# Patient Record
Sex: Male | Born: 1955 | Race: Black or African American | Hispanic: No | Marital: Married | Smoking: Never smoker
Health system: Southern US, Community
[De-identification: ages and names within clinical notes are randomized; demographics above are authoritative.]

## PROBLEM LIST (undated history)

## (undated) DIAGNOSIS — R091 Pleurisy: Secondary | ICD-10-CM

## (undated) DIAGNOSIS — M48061 Spinal stenosis, lumbar region without neurogenic claudication: Secondary | ICD-10-CM

## (undated) DIAGNOSIS — C61 Malignant neoplasm of prostate: Secondary | ICD-10-CM

## (undated) DIAGNOSIS — R200 Anesthesia of skin: Secondary | ICD-10-CM

## (undated) DIAGNOSIS — R202 Paresthesia of skin: Secondary | ICD-10-CM

## (undated) DIAGNOSIS — K5732 Diverticulitis of large intestine without perforation or abscess without bleeding: Secondary | ICD-10-CM

## (undated) HISTORY — PX: POLYPECTOMY: SHX149

## (undated) HISTORY — DX: Spinal stenosis, lumbar region without neurogenic claudication: M48.061

## (undated) HISTORY — DX: Diverticulitis of large intestine without perforation or abscess without bleeding: K57.32

## (undated) HISTORY — PX: COLONOSCOPY: SHX174

## (undated) HISTORY — DX: Pleurisy: R09.1

---

## 2005-04-04 ENCOUNTER — Ambulatory Visit: Payer: Self-pay | Admitting: Internal Medicine

## 2005-04-05 ENCOUNTER — Encounter: Payer: Self-pay | Admitting: Internal Medicine

## 2005-04-05 ENCOUNTER — Ambulatory Visit (HOSPITAL_COMMUNITY): Admission: RE | Admit: 2005-04-05 | Discharge: 2005-04-05 | Payer: Self-pay | Admitting: Internal Medicine

## 2005-04-18 ENCOUNTER — Ambulatory Visit: Payer: Self-pay | Admitting: Internal Medicine

## 2005-04-20 ENCOUNTER — Ambulatory Visit: Payer: Self-pay

## 2005-05-11 ENCOUNTER — Ambulatory Visit: Payer: Self-pay | Admitting: Internal Medicine

## 2005-09-07 ENCOUNTER — Ambulatory Visit: Payer: Self-pay | Admitting: Internal Medicine

## 2005-11-18 ENCOUNTER — Ambulatory Visit: Payer: Self-pay | Admitting: Internal Medicine

## 2006-04-12 ENCOUNTER — Ambulatory Visit: Payer: Self-pay | Admitting: Internal Medicine

## 2006-05-01 ENCOUNTER — Ambulatory Visit: Payer: Self-pay | Admitting: Internal Medicine

## 2006-07-03 ENCOUNTER — Ambulatory Visit: Payer: Self-pay | Admitting: Internal Medicine

## 2006-07-12 ENCOUNTER — Ambulatory Visit: Payer: Self-pay | Admitting: Internal Medicine

## 2006-10-03 ENCOUNTER — Ambulatory Visit: Payer: Self-pay | Admitting: Internal Medicine

## 2007-04-16 ENCOUNTER — Ambulatory Visit: Payer: Self-pay | Admitting: Internal Medicine

## 2007-08-31 ENCOUNTER — Encounter: Payer: Self-pay | Admitting: Internal Medicine

## 2007-08-31 DIAGNOSIS — L02519 Cutaneous abscess of unspecified hand: Secondary | ICD-10-CM | POA: Insufficient documentation

## 2007-08-31 DIAGNOSIS — L03119 Cellulitis of unspecified part of limb: Secondary | ICD-10-CM

## 2007-08-31 DIAGNOSIS — R091 Pleurisy: Secondary | ICD-10-CM | POA: Insufficient documentation

## 2007-08-31 DIAGNOSIS — N508 Other specified disorders of male genital organs: Secondary | ICD-10-CM | POA: Insufficient documentation

## 2007-09-27 ENCOUNTER — Encounter: Payer: Self-pay | Admitting: Internal Medicine

## 2007-09-27 ENCOUNTER — Ambulatory Visit: Payer: Self-pay | Admitting: Internal Medicine

## 2007-09-27 DIAGNOSIS — M48061 Spinal stenosis, lumbar region without neurogenic claudication: Secondary | ICD-10-CM

## 2007-09-27 DIAGNOSIS — K5900 Constipation, unspecified: Secondary | ICD-10-CM | POA: Insufficient documentation

## 2007-09-27 DIAGNOSIS — Z8719 Personal history of other diseases of the digestive system: Secondary | ICD-10-CM | POA: Insufficient documentation

## 2007-09-27 DIAGNOSIS — N390 Urinary tract infection, site not specified: Secondary | ICD-10-CM | POA: Insufficient documentation

## 2007-09-27 HISTORY — DX: Spinal stenosis, lumbar region without neurogenic claudication: M48.061

## 2007-09-27 LAB — CONVERTED CEMR LAB
Bacteria, UA: NEGATIVE
Leukocytes, UA: NEGATIVE
Total Protein, Urine: NEGATIVE mg/dL
pH: 6 (ref 5.0–8.0)

## 2009-07-22 ENCOUNTER — Ambulatory Visit: Payer: Self-pay | Admitting: Internal Medicine

## 2009-07-22 DIAGNOSIS — R1032 Left lower quadrant pain: Secondary | ICD-10-CM | POA: Insufficient documentation

## 2009-07-23 ENCOUNTER — Encounter: Payer: Self-pay | Admitting: Internal Medicine

## 2009-07-23 ENCOUNTER — Inpatient Hospital Stay (HOSPITAL_COMMUNITY): Admission: AD | Admit: 2009-07-23 | Discharge: 2009-07-26 | Payer: Self-pay | Admitting: Internal Medicine

## 2009-07-23 ENCOUNTER — Ambulatory Visit: Payer: Self-pay | Admitting: Cardiology

## 2009-07-23 ENCOUNTER — Ambulatory Visit: Payer: Self-pay | Admitting: Internal Medicine

## 2009-07-23 DIAGNOSIS — K5732 Diverticulitis of large intestine without perforation or abscess without bleeding: Secondary | ICD-10-CM

## 2009-07-23 DIAGNOSIS — R310 Gross hematuria: Secondary | ICD-10-CM | POA: Insufficient documentation

## 2009-07-23 HISTORY — DX: Diverticulitis of large intestine without perforation or abscess without bleeding: K57.32

## 2009-07-23 LAB — CONVERTED CEMR LAB
ALT: 21 units/L (ref 0–53)
AST: 17 units/L (ref 0–37)
Albumin: 3.4 g/dL — ABNORMAL LOW (ref 3.5–5.2)
BUN: 9 mg/dL (ref 6–23)
Basophils Relative: 0.4 % (ref 0.0–3.0)
Chloride: 103 meq/L (ref 96–112)
Eosinophils Relative: 0.1 % (ref 0.0–5.0)
GFR calc non Af Amer: 89.86 mL/min (ref >60)
HCT: 35.8 % — ABNORMAL LOW (ref 39.0–52.0)
Hemoglobin: 12.3 g/dL — ABNORMAL LOW (ref 13.0–17.0)
Lymphs Abs: 2 10*3/uL (ref 0.7–4.0)
MCV: 89.8 fL (ref 78.0–100.0)
Monocytes Absolute: 1.5 10*3/uL — ABNORMAL HIGH (ref 0.1–1.0)
Monocytes Relative: 8.4 % (ref 3.0–12.0)
Neutro Abs: 13.8 10*3/uL — ABNORMAL HIGH (ref 1.4–7.7)
Nitrite: POSITIVE
Potassium: 3.5 meq/L (ref 3.5–5.1)
RBC: 3.99 M/uL — ABNORMAL LOW (ref 4.22–5.81)
Sodium: 138 meq/L (ref 135–145)
Total Bilirubin: 1.5 mg/dL — ABNORMAL HIGH (ref 0.3–1.2)
Total Protein, Urine: 100 mg/dL
Total Protein: 7.9 g/dL (ref 6.0–8.3)
Urobilinogen, UA: >=8 (ref 0.0–1.0)
WBC: 17.4 10*3/uL — ABNORMAL HIGH (ref 4.5–10.5)

## 2009-08-12 ENCOUNTER — Ambulatory Visit: Payer: Self-pay | Admitting: Gastroenterology

## 2009-08-21 ENCOUNTER — Encounter: Payer: Self-pay | Admitting: Gastroenterology

## 2009-08-21 ENCOUNTER — Ambulatory Visit: Payer: Self-pay | Admitting: Gastroenterology

## 2009-08-25 ENCOUNTER — Encounter: Payer: Self-pay | Admitting: Gastroenterology

## 2010-09-24 ENCOUNTER — Ambulatory Visit: Payer: Self-pay | Admitting: Internal Medicine

## 2010-11-30 ENCOUNTER — Ambulatory Visit: Payer: Self-pay | Admitting: Internal Medicine

## 2010-11-30 DIAGNOSIS — M79609 Pain in unspecified limb: Secondary | ICD-10-CM | POA: Insufficient documentation

## 2010-12-01 ENCOUNTER — Telehealth: Payer: Self-pay | Admitting: Internal Medicine

## 2010-12-04 ENCOUNTER — Ambulatory Visit (HOSPITAL_COMMUNITY)
Admission: RE | Admit: 2010-12-04 | Discharge: 2010-12-04 | Payer: Self-pay | Source: Home / Self Care | Attending: Internal Medicine | Admitting: Internal Medicine

## 2010-12-06 ENCOUNTER — Telehealth (INDEPENDENT_AMBULATORY_CARE_PROVIDER_SITE_OTHER): Payer: Self-pay | Admitting: *Deleted

## 2010-12-07 ENCOUNTER — Encounter: Payer: Self-pay | Admitting: Internal Medicine

## 2010-12-07 ENCOUNTER — Telehealth: Payer: Self-pay | Admitting: Internal Medicine

## 2011-01-18 NOTE — Assessment & Plan Note (Signed)
Summary: ?poison ivy/cd   Vital Signs:  Patient profile:   55 year old male Height:      70 inches Weight:      223 pounds BMI:     32.11 O2 Sat:      98 % on Room air Temp:     97.8 degrees F oral Pulse rate:   89 / minute BP sitting:   152 / 84  (left arm) Cuff size:   regular  Vitals Entered By: Bill Salinas [name] (September 24, 2010 3:31 PM)  O2 Flow:  Room air CC: office visit for evaluation of poison oak/ ab   Primary Care Mayank Teuscher:  Illene Regulus, MD  CC:  office visit for evaluation of poison oak/ ab.  History of Present Illness: Patient presents with contact dermatitis of several days duration. He has excoriated his arms. He has spread of rash to abdomen and chest. No pain but he has consideral pruritis.  Current Medications (verified): 1)  Tylenol Extra Strength 500 Mg  Tabs (Acetaminophen) .... As Needed 2)  Advil 200 Mg  Caps (Ibuprofen) .... As Needed  Allergies (verified): 1)  Vicodin PMH-FH-SH reviewed-no changes except otherwise noted  Review of Systems  The patient denies anorexia, fever, weight loss, chest pain, dyspnea on exertion, abdominal pain, muscle weakness, and enlarged lymph nodes.    Physical Exam  General:  Well-developed,well-nourished,in no acute distress; alert,appropriate and cooperative throughout examination Head:  normocephalic and atraumatic.   Eyes:  C&S clear Lungs:  normal respiratory effort and no wheezes.   Heart:  normal rate and regular rhythm.   Skin:  scaly, whitish excoriated rash worse on the left forearm. Also present on right arm and abdomen. He has linear lesion on the right arm. Psych:  normally interactive and good eye contact.     Impression & Recommendations:  Problem # 1:  RHUS DERMATITIS (ICD-692.6) Patient with spreading contact dermatitis.  Plan - prednisone burst and taper           for pruritis - loratadine 10mg  two times a day and ranitidine 150mg  two times a day.           for failure to resolve will  refer to dermatologist - He will call if needed.   His updated medication list for this problem includes:    Prednisone 10 Mg Tabs (Prednisone) .Marland KitchenMarland KitchenMarland KitchenMarland Kitchen 4 tabs once daily x 3, 3 tas once daily x 3,  2 tabs once daily x 3, 1 tab once daily x 6  Complete Medication List: 1)  Tylenol Extra Strength 500 Mg Tabs (Acetaminophen) .... As needed 2)  Advil 200 Mg Caps (Ibuprofen) .... As needed 3)  Prednisone 10 Mg Tabs (Prednisone) .... 4 tabs once daily x 3, 3 tas once daily x 3,  2 tabs once daily x 3, 1 tab once daily x 6  Patient Instructions: 1)  contact dermatitis - take prednisone burst and taper as instructed on the bottle; tepid baths for severe itching with a couple of tablespoons of baking soda; for itching not relieved with prednisone take loratadine 10mg  twice a day and rantitidine 150mg  twice a day.  Prescriptions: PREDNISONE 10 MG TABS (PREDNISONE) 4 tabs once daily x 3, 3 tas once daily x 3,  2 tabs once daily x 3, 1 tab once daily x 6  #33 x 0   Entered and Authorized by:   Jacques Navy MD   Signed by:   Jacques Navy MD on 09/24/2010  Method used:   Electronically to        [facility]. # [id number]* (retail)       [address]       [place], [place]  [address]       Ph: [phone] or [phone]       Fax: [phone]   RxID:   [id number]

## 2011-01-20 NOTE — Letter (Signed)
Summary: Out of Work  [facility]  [address] [place], [place] [address]   Phone: [phone]  Fax: [phone]    November 30, 2010   Employee:  Juan Richards    To Whom It May Concern:   For Medical reasons, please excuse the above named employee from work for the following dates:   Start:  Tuesday, November 30, 2010  End:   Monday, December 1`9, 2011  If you need additional information, please feel free to contact our office.         Sincerely,    Jacques Navy MD

## 2011-01-20 NOTE — Letter (Signed)
Summary: Out of Work  [facility]  [address] [place], [place] [address]   Phone: [phone]  Fax: [phone]    December 07, 2010   Employee:  MIKKO LEWELLEN    To Whom It May Concern:   For Medical reasons, please excuse the above named employee from work for the following dates:    End: December 08, 2010 and return to work December 09, 2010    If you need additional information, please feel free to contact our office.         Sincerely,    Jacques Navy MD

## 2011-01-20 NOTE — Progress Notes (Signed)
Summary: PAIN   Phone Note Call from Patient   Caller: Patient Summary of Call: Patient called stating that he started all given meds yesterday. Four tabs of prednisone and  3 tabs of roxicet w/o relief. Pt states that he was up all night due to discomfort. Patient would like something different. Please advise. Initial call taken by: Rock Nephew [name],  December 01, 2010 9:39 AM  Follow-up for Phone Call        he may take two oxycodone every six hours. He will need a new prescription - can pick-up RX tomorrow for oxycodone/APAP 5/325 sig - 1 or 2 every 6 hours, #80. Follow-up by: Jacques Navy MD,  December 01, 2010 1:28 PM  Additional Follow-up for Phone Call Additional follow up Details #1::        left mess to call office back................Marland KitchenLamar Sprinkles, [name]  December 01, 2010 3:00 PM     Additional Follow-up for Phone Call Additional follow up Details #2::    pt states oxycodone has not helped at all. He needs something else he states he has been taking two with no relief Follow-up by: Ami Bullins [name],  December 01, 2010 3:07 PM  Additional Follow-up for Phone Call Additional follow up Details #3:: Details for Additional Follow-up Action Taken: left a message on patient's cell phone - call back this evening by six to either arrange for a long acting morphine product or to arrange a 24 hr obs admisssion for pain mgt and to work up the problem. Gave himn the [phone] to call.   Several attempts, unable to contact pt, no return calls................Marland KitchenLamar Sprinkles, [name]  December 03, 2010 4:35 PM   Additional Follow-up by: Jacques Navy MD,  December 01, 2010 4:56 PM

## 2011-01-20 NOTE — Progress Notes (Signed)
Summary: [facility] NOTE & PAIN MED  Phone Note Call from Patient Call back at Work Phone [phone]   Summary of Call: 1. Patient is requesting rx to help w/pain. 2 of previous med helped very little. Does he need office visit?  2. Patient is requesting work note - To return to wk 12/22.  Initial call taken by: Lamar Sprinkles, [name],  December 07, 2010 9:12 AM  Follow-up for Phone Call        1. start gabapentin 300mg  1 at bedtime x 5 days, then two times a day x 5 days then three times a day. #90, refill x 3 2. oxycodone/apap 7.5/325 1 by mouth three times a day x 30 nor refills 3. work return done.  Follow-up by: Jacques Navy MD,  December 07, 2010 1:07 PM  Additional Follow-up for Phone Call Additional follow up Details #1::        left mess to call office back.................Marland KitchenLamar Sprinkles, [name]  December 07, 2010 2:44 PM   Pt informed  Additional Follow-up by: Lamar Sprinkles, [name],  December 07, 2010 3:10 PM    New/Updated Medications: ENDOCET 10-325 MG TABS (OXYCODONE-ACETAMINOPHEN) 1 by mouth three times a day as needed GABAPENTIN 300 MG CAPS (GABAPENTIN) 1 at bedtime x 5 days, 1 two times a day x 5 days then 1 three times a day Prescriptions: GABAPENTIN 300 MG CAPS (GABAPENTIN) 1 at bedtime x 5 days, 1 two times a day x 5 days then 1 three times a day  #90 x 0   Entered by:   Lamar Sprinkles, [name]   Authorized by:   Jacques Navy MD   Signed by:   Lamar Sprinkles, [name] on 12/07/2010   Method used:   Electronically to        [facility]  [address] [id number]* (retail)       [address]       [place], [place]  [address]       Ph: [phone] or [phone]       Fax: [phone]   RxID:   [id number] ENDOCET 10-325 MG TABS (OXYCODONE-ACETAMINOPHEN) 1 by mouth three times a day as needed  #30 x 0   Entered and Authorized by:   Jacques Navy MD   Signed by:   Jacques Navy MD on 12/07/2010   Method used:   Print then Give to Patient   RxID:    [id number]

## 2011-01-20 NOTE — Assessment & Plan Note (Signed)
Summary: back & leg pain/#/cd   Vital Signs:  Patient profile:   55 year old male Height:      70 inches Weight:      225 pounds BMI:     32.40 O2 Sat:      98 % on Room air Temp:     97.2 degrees F oral Pulse rate:   58 / minute BP sitting:   118 / 86  (left arm) Cuff size:   regular  Vitals Entered By: Bill Salinas [name] (November 30, 2010 3:33 PM)  O2 Flow:  Room air CC: pt c/o sciatic nerve pain radiating down right leg/ ab Comments Pt states he is currently not taking any medications/ ab   Primary Care Provider:  Illene Regulus, MD  CC:  pt c/o sciatic nerve pain radiating down right leg/ ab.  History of Present Illness: patient presents with a three day history of severe pain in the right leg that shoots down his whole leg. He has a h/o bulging disks with MRI lumbar spine in '06. He did do lifting and bending putting up the Xmas tree. He also had several hard sneezes and the pain started right after that.  He rates this pain  as severe that interferes with his ability to sleep and work.   Current Medications (verified): 1)  Tylenol Extra Strength 500 Mg  Tabs (Acetaminophen) .... As Needed 2)  Advil 200 Mg  Caps (Ibuprofen) .... As Needed 3)  Prednisone 10 Mg Tabs (Prednisone) .... 4 Tabs Once Daily X 3, 3 Tas Once Daily X 3,  2 Tabs Once Daily X 3, 1 Tab Once Daily X 6  Allergies (verified): 1)  Vicodin  Past History:  Past Medical History: Last updated: 08/12/2009 Hx of EPIDIDYMAL CYST (ICD-608.89) Hx of CELLULITIS, HAND (ICD-682.4) Hx of PLEURISY (ICD-511.0) spinal stenosis/lumbar ddd Diverticulitis  Past Surgical History: Last updated: 08/12/2009 Unremarkable  Family History: Last updated: 08/12/2009 Father: ? cancer Mother: dm  Siblings:  Family History of Kidney Disease:Mother  No FH of Colon Cancer:  Social History: Last updated: 08/12/2009 Occupation: Tow Set designer  Married  Patient has never smoked.  Alcohol Use - no Daily Caffeine Use:  one daily  Illicit Drug Use - no Patient does not get regular exercise.   Review of Systems       The patient complains of difficulty walking.  The patient denies anorexia, fever, weight loss, weight gain, chest pain, syncope, dyspnea on exertion, prolonged cough, abdominal pain, severe indigestion/heartburn, muscle weakness, unusual weight change, abnormal bleeding, and angioedema.    Physical Exam  General:  Well-developed,well-nourished,in no acute distress; alert,appropriate and cooperative throughout examination Head:  Normocephalic and atraumatic without obvious abnormalities. No apparent alopecia or balding. Neck:  supple and full ROM.   Lungs:  normal respiratory effort.   Heart:  normal rate and regular rhythm.   Msk:  normal ROM, no joint tenderness, no joint swelling, and no joint warmth.   Pulses:  2+ pulse DP Neurologic:  alert & oriented X3 and cranial nerves II-XII intact.  Unable to toe walk right foot, step-up to exam table with great effort and discomfort, markedly diminished DTR right patellar tendon, mild decrease in deep vibratory sensation right distal LE/foot. Skin:  turgor normal, color normal, no rashes, and no suspicious lesions.   Psych:  Oriented X3, memory intact for recent and remote, normally interactive, and good eye contact.     Impression & Recommendations:  Problem # 1:  LEG  PAIN, RIGHT (ICD-729.5) Suspect acute HNP with radicular findings. Reviewed previous MRI '06 with multi-level disk bulging.  Plan - oxycodone/APAP 5/325 1-2 q6           prednisone burst and taper           MRI lumbar spine           NS referral if needed.  Orders: Radiology Referral (Radiology)  Addendum 12/14 - patient's MRI is scheduled for Saturday. He called back with unrlieved pain. Have tried to reach him to offer in-patient eval and pain mgt but as of 2000hrs no call back.  Complete Medication List: 1)  Tylenol Extra Strength 500 Mg Tabs (Acetaminophen) .... As  needed 2)  Advil 200 Mg Caps (Ibuprofen) .... As needed 3)  Prednisone 10 Mg Tabs (Prednisone) .... 4 tabs once daily x 3, 3 tas once daily x 3,  2 tabs once daily x 3, 1 tab once daily x 6 4)  Roxicet 5-325 Mg Tabs (Oxycodone-acetaminophen) .Marland Kitchen.. 1 by mouth q6 as needed severe leg pain. Prescriptions: PREDNISONE 10 MG TABS (PREDNISONE) 4 tabs once daily x 3, 3 tas once daily x 3,  2 tabs once daily x 3, 1 tab once daily x 6  #33 x 0   Entered and Authorized by:   Jacques Navy MD   Signed by:   Jacques Navy MD on 11/30/2010   Method used:   Print then Give to Patient   RxID:   [id number] ROXICET 5-325 MG TABS (OXYCODONE-ACETAMINOPHEN) 1 by mouth q6 as needed severe leg pain.  #30 x 0   Entered and Authorized by:   Jacques Navy MD   Signed by:   Jacques Navy MD on 11/30/2010   Method used:   Print then Give to Patient   RxID:   [id number]    Orders Added: 1)  Radiology Referral [Radiology] 2)  Est. Patient Level IV [[address]]

## 2011-01-20 NOTE — Progress Notes (Signed)
Summary: MRI RESULTS  Phone Note Call from Patient   Caller: pt Summary of Call: Pt calling for results on MRI and what the next step should be in his care. Please Advise Initial call taken by: Ami Bullins [name],  December 06, 2010 9:40 AM  Follow-up for Phone Call        MRI showed mild degenerative disk disease but no acute, severe nerve impingement. If his pain has continued at the same level would recommend NS evaluation. Follow-up by: Jacques Navy MD,  December 06, 2010 10:02 AM  Additional Follow-up for Phone Call Additional follow up Details #1::        Pt continues to have pain and req rx to help. Roxicet  2 at a time gave very little relief. Never recieved messages from last week from our office. He would also like referral.  Additional Follow-up by: Lamar Sprinkles, [name],  December 06, 2010 12:01 PM    Additional Follow-up for Phone Call Additional follow up Details #2::    will refer to neurosurgery Follow-up by: Jacques Navy MD,  December 06, 2010 12:52 PM

## 2011-03-27 LAB — CBC
HCT: 36.6 % — ABNORMAL LOW (ref 39.0–52.0)
Hemoglobin: 11 g/dL — ABNORMAL LOW (ref 13.0–17.0)
Hemoglobin: 11.1 g/dL — ABNORMAL LOW (ref 13.0–17.0)
Hemoglobin: 11.9 g/dL — ABNORMAL LOW (ref 13.0–17.0)
MCHC: 32.5 g/dL (ref 30.0–36.0)
MCHC: 33.6 g/dL (ref 30.0–36.0)
MCV: 92 fL (ref 78.0–100.0)
RBC: 3.58 MIL/uL — ABNORMAL LOW (ref 4.22–5.81)
RBC: 3.61 MIL/uL — ABNORMAL LOW (ref 4.22–5.81)
RBC: 3.98 MIL/uL — ABNORMAL LOW (ref 4.22–5.81)
RDW: 13 % (ref 11.5–15.5)
WBC: 10 10*3/uL (ref 4.0–10.5)

## 2011-03-27 LAB — URINE MICROSCOPIC-ADD ON

## 2011-03-27 LAB — BASIC METABOLIC PANEL
Calcium: 8.6 mg/dL (ref 8.4–10.5)
GFR calc Af Amer: >60 mL/min (ref >60)
GFR calc non Af Amer: >60 mL/min (ref >60)
Sodium: 137 mEq/L (ref 135–145)

## 2011-03-27 LAB — URINALYSIS, ROUTINE W REFLEX MICROSCOPIC
Leukocytes, UA: NEGATIVE
Protein, ur: 30 mg/dL — AB
Specific Gravity, Urine: 1.023 (ref 1.005–1.030)
Urobilinogen, UA: 2 mg/dL — ABNORMAL HIGH (ref 0.0–1.0)

## 2011-03-27 LAB — PROTIME-INR
INR: 1.1 (ref 0.00–1.49)
Prothrombin Time: 14.5 seconds (ref 11.6–15.2)

## 2011-03-27 LAB — URINE CULTURE
Colony Count: NO GROWTH
Culture: NO GROWTH

## 2011-05-03 NOTE — Consult Note (Signed)
Juan Richards, Juan Richards             ACCOUNT NO.:  [id number]   MEDICAL RECORD NO.:  [id number]          PATIENT TYPE:  INP   LOCATION:  1506                         FACILITY:  [facility]   PHYSICIAN:  Clovis Pu. Cornett, M.D.DATE OF BIRTH:  [birth year]   DATE OF CONSULTATION:  07/23/2009  DATE OF DISCHARGE:                                 CONSULTATION   PHYSICIAN REQUESTING CONSULTATION:  Dr. Felicity Coyer of the hospitalist  service for [place].   REASON FOR CONSULTATION:  Abdominal pain and acute diverticulitis.   HISTORY OF PRESENT ILLNESS:  The patient is a pleasant 55 year old male  with a 4-day history of left lower quadrant pain.  He saw Dr. Melvyn Novas  yesterday who placed him on Cipro and Flagyl and sent him for a CT scan  today.  Today, his pain is improved about 3/10 compared to 7/10  yesterday.  A CT showed acute diverticulitis with some small  questionable loculus of air versus diverticula.  There was no abscess.  He has had no fever, no chills, and his left lower quadrant pain which  was 7/10 yesterday is now 3/10.  There is no radiation to his back,  groin, or into his pelvis.  There has been no associated nausea,  vomiting or diarrhea with it.  I was asked to the patient at the request  of Dr. Felicity Coyer for this.   PAST MEDICAL HISTORY:  History of pleurisy and cellulitis.   PAST SURGICAL HISTORY:  None.   MEDICATIONS:  He currently is on Cipro and Flagyl at home and some pain  medicine.   ALLERGIES:  VICODIN.   FAMILY HISTORY:  Noncontributory.   SOCIAL HISTORY:  No evidence of tobacco or alcohol use.   REVIEW OF SYSTEMS:  Positive for left lower quadrant pain, otherwise  negative x15.   PHYSICAL EXAMINATION:  VITAL SIGNS:  His temperature is 98, pulse 77,  blood pressure 123/76.  HEENT:  No evidence of jaundice.  Oropharynx is clear.  NECK:  Supple, nontender.  Trachea midline.  PULMONARY:  Lung sounds are clear bilaterally.  Chest wall motion is  normal.  CARDIOVASCULAR:  Regular rate and rhythm without rub, murmur, or gallop.  EXTREMITIES:  Warm, well perfused.  ABDOMEN:  Tender left lower quadrant which was quite minimal. No mass.  No hernia.  No diffuse peritonitis.  EXTREMITIES:  No clubbing, cyanosis, nor edema.  Muscle tone and range  of motion normal.  NEUROLOGICAL:  Glasgow coma scale is 15.  Motor and sensory functions  are grossly intact.   DIAGNOSTIC STUDIES:  Reviewed abdominal and pelvic CT scan which showed  acute diverticulitis.  There may be some small loculus of air, or these  could be diverticula on end.  There is no abscess.  There is no free  fluid or otherwise free air.  He had a white count 17,400 with left  shift.  Hemoglobin 12.3, platelet count 206,000. BMP sodium 138,  potassium 3.5, chloride 103, CO2 of 30, BUN 9, creatinine 1.1, glucose  113.  Urinalysis is positive for nitrite, otherwise negative.   IMPRESSION:  Acute diverticulitis without major complicating  features.   PLAN:  I recommend IV antibiotics and would transition to p.o.  antibiotics as tolerated.  I would keep him n.p.o. tonight and advance  his diet tomorrow as long as his pain is improved.  This is his first  attack on talking with him. Therefore, no surgical interventions is  probably required long-term.  If he does not improve, then he could be a  surgical candidate. But currently, he needs to be treated medically with  medical follow-up as long as he improves.  Thank you for this  consultation.      Thomas A. Cornett, M.D.  Electronically Signed     TAC/MEDQ  D:  07/23/2009  T:  07/23/2009  Job:  [id number]   cc:   Penni Bombard, MD  Fax: [phone]   Vikki Ports A. Felicity Coyer, MD  [address] [place], [place] [address]

## 2011-05-03 NOTE — Discharge Summary (Signed)
NAMEWILBURT, MESSINA             ACCOUNT NO.:  [id number]   MEDICAL RECORD NO.:  [id number]          PATIENT TYPE:  INP   LOCATION:  1506                         FACILITY:  [facility]   PHYSICIAN:  Rosalyn Gess. Norins, MD  DATE OF BIRTH:  Jul 12, 1956   DATE OF ADMISSION:  07/23/2009  DATE OF DISCHARGE:  07/26/2009                               DISCHARGE SUMMARY   ADMITTING DIAGNOSIS:  Diverticulitis.   DISCHARGE DIAGNOSIS:  Diverticulitis.   CONSULTANTS:  Dr. Luisa Hart, [facility].   PROCEDURES:  1. CT scan of the abdomen and pelvis performed on July 23, 2009,      which showed prominent diverticulitis-type changes in the distal      descending colon and sigmoid colon.  Degenerative change in the      lumbar spine.  Pelvis showed descending colon, sigmoid colon      diverticulitis with changes extending into the pelvis without      evidence of abscess or drainable abscess.  2. Abdominal film July 25, 2009, which showed air fluid levels within      the nondilated large and small bowel compatible with ileus.   HISTORY OF THE PRESENT ILLNESS:  Patient is a 55 year old African  American gentleman who was seen in the office by Dr. Efrain Sella for  abdominal pain and discomfort.  CT scan of the abdomen was ordered which  revealed diverticulitis.  An attempt was made for outpatient treatment  but the patient had increasing pain and discomfort.  Patient has had  normal bowel movements.  He has complained of nausea.  He had no fevers  or chills.  No prior colonoscopy.  He had been started on Cipro and  Flagyl as an outpatient.  Patient also complains of worsening pain with  urination and question of hematuria x2 but no flank pain.  Because of  his known diverticulitis by CT and progressive symptoms, he was  admitted.  Please see the EMR generated H and P for past medical  history, family history, social history, and examination at admission.   HOSPITAL COURSE:  1. Patient  was admitted to a regular floor.  He had been seen in      consultation by Dr. Luisa Hart for [facility] surgery and it was      felt the patient was not in need of surgical intervention.  Patient      was followed conservatively with being made n.p.o.  No NG suction      was required.  Patient was started on IV Cipro and Flagyl.  On this      regimen, the patient did well.  He had decreasing pain and      discomfort.  He had maintained a good appetite.  He was having      bowel movements.  Followup KUB as noted.  Patient continued to      improve with absence of significant abdominal pain or discomfort.      He had no significant fever and was taking a diet.  With the      patient being medically stable without need  for surgical      intervention with his symptoms improving, from this perspective he      is felt to be ready for discharge home to continue outpatient oral      antibiotics.  2. Hematuria.  Patient reported he had had gross hematuria.  At the      time of admission, urinalysis was performed with microscopic exam      revealing only 0 to 2 red blood cells per high-powered field.      There were no further complaints of hematuria.  This problem does      not need additional workup at this time.  3. Hematochezia.  This is by history the patient having passed some      blood by rectum.  He was to be scheduled for colonoscopy and this      will still be the plan to schedule him for colonoscopy in 4 to 6      weeks.   DISCHARGE EXAMINATION:  Temperature 99.2.  Blood pressure 134/86.  Heart  rate 80.  Respirations 20.  GENERAL APPEARANCE:  A well-nourished, healthy-appearing, African  American gentleman in no acute distress.  CHEST:  Patient has no increased work of breathing.  ABDOMEN:  Patient has got mild guarding.  He had positive bowel sounds.  He had no significant tenderness to deep palpation.   FINAL LABORATORY:  CBC from July 25, 2009, with a white count of 9900,   hemoglobin of 11.1 g.  Urine was cultured and was no growth at the time  of discharge dictation.  CPK had been ordered at time of admission.  This was normal at 100.  Prior to admission, patient had liver function  studies that showed mild elevation and a total bilirubin at 1.5,  otherwise unremarkable.  Of note, his pre treatment white count was  17,400.   DISPOSITION:  Patient is discharged home.   DISCHARGE MEDICATIONS:  Patient will continue on medications as at  admission including:  1. Tylenol 500 mg 2 tablets q.6 p.r.n.  2. Advil 200 mg p.r.n.  3. Ciprofloxacin 500 mg b.i.d. to complete his home supply.  4. Metronidazole 500 mg t.i.d. to complete his home supply.   Patient will be seen in followup by Dr. Luisa Hart in 2 weeks.  He will be  seen by Dr. Debby Bud in followup as needed.  We will schedule the patient  for colonoscopy in 4 to 6 weeks.   CONDITION AT TIME OF DISCHARGE DICTATION:  Stable and improved.      Rosalyn Gess Norins, MD  Electronically Signed     MEN/MEDQ  D:  07/26/2009  T:  07/26/2009  Job:  [id number]   cc:   Thomas A. Cornett, M.D.  [address] [place] [address]  [place] [address]

## 2011-05-24 ENCOUNTER — Encounter: Payer: Self-pay | Admitting: Internal Medicine

## 2011-05-24 ENCOUNTER — Encounter: Payer: Self-pay | Admitting: *Deleted

## 2011-05-24 ENCOUNTER — Ambulatory Visit (INDEPENDENT_AMBULATORY_CARE_PROVIDER_SITE_OTHER): Payer: 59 | Admitting: Internal Medicine

## 2011-05-24 VITALS — BP 112/82 | HR 75 | Temp 97.4°F | Ht 70.0 in | Wt 224.8 lb

## 2011-05-24 DIAGNOSIS — S0003XA Contusion of scalp, initial encounter: Secondary | ICD-10-CM

## 2011-05-24 MED ORDER — HYDROCODONE-ACETAMINOPHEN 5-500 MG PO TABS: 1.0000 | ORAL_TABLET | ORAL | Status: AC | PRN

## 2011-05-24 NOTE — Patient Instructions (Signed)
It was good to see you today. No evidence for concussion but your scalp and temple will be sore and swollen for next few days Ice pack to injured area per session every 3-4h for next 24h, then as needed for swelling and pain Vicodin for pain as needed - Your prescription(s) have been submitted to your pharmacy. Please take as directed and contact our office if you believe you are having problem(s) with the medication(s). If family notices any confusion, or if your head pain becomes worse call here or EMS or go to ER as needed

## 2011-05-24 NOTE — Progress Notes (Signed)
  Subjective:    Patient ID: Juan Richards, male    DOB: 1956-10-21, 55 y.o.   MRN: [id number]  HPI complains of trauma to head -  Onset 3 h ago Ran into door frame when startled by co-workers this AM (rat in a box) Hit right side of temple/forehead against door No LOC, vision changes, or weakness Felt mild head pain but increasing tenderness over scalp in past 3 h associated with with "knot" and swelling over right forehead No confusion or eye pain  Past Medical History  Diagnosis Date  . DIVERTICULITIS, COLON, WITH PERFORATION 07/23/2009  . EPIDIDYMAL CYST 08/31/2007  . 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 PLEURISY     Review of Systems  Constitutional: Negative for fever.  Respiratory: Negative for shortness of breath.   Cardiovascular: Negative for chest pain.  Musculoskeletal: Negative for gait problem.  Neurological: Negative for tremors, syncope, facial asymmetry, speech difficulty, weakness, light-headedness and numbness.  Psychiatric/Behavioral: Negative for confusion.       Objective:   Physical Exam BP 112/82  Pulse 75  Temp(Src) 97.4 F (36.3 C) (Oral)  Ht 5\' 10"  (1.778 m)  Wt 224 lb 12.8 oz (101.969 kg)  BMI 32.26 kg/m2  SpO2 98%  Physical Exam  Constitutional:  oriented to person, place, and time. appears well-developed and well-nourished. No distress.  HENT - small hematoma 1.5cm raised knot over right lateral forehead at hairline, TMJ smooth and no other bruising/swelling along temple and right face Neck: Normal range of motion. Neck supple. No JVD present. No thyromegaly present.  Cardiovascular: Normal rate, regular rhythm and normal heart sounds.  No murmur heard. Pulmonary/Chest: Effort normal and breath sounds normal. No respiratory distress. no wheezes.  Neurological: he is alert and oriented to person, place, and time. No cranial nerve deficit. Coordination normal. no dysarthria, MAE 5/5 - vision intact B Skin: see HENT above - Skin is warm and  dry.  No laceration, erythema or ulceration.  Psychiatric: he has a normal mood and affect. behavior is normal. Judgment and thought content normal.        Assessment & Plan:  Head contusion with scalp hematoma - neuro exam benign, no evidence for concussion - reassurance provided and education of symptoms to watch - ice pack x 24h, then prn for swelling and pain - also limited rx for vicodin to help control pain symptoms (has taken same before and tolerated ok when taken separate from Tylenol #3 in past) - to have family supervise next 24h for any confusion or behavior change and call here or EMS/ER if changes noted - pt understands and agrees to same - work note for today provided

## 2011-11-04 ENCOUNTER — Other Ambulatory Visit: Payer: Self-pay | Admitting: Neurological Surgery

## 2011-11-04 DIAGNOSIS — M545 Low back pain: Secondary | ICD-10-CM

## 2011-11-09 ENCOUNTER — Other Ambulatory Visit: Payer: 59

## 2011-11-11 ENCOUNTER — Ambulatory Visit
Admission: RE | Admit: 2011-11-11 | Discharge: 2011-11-11 | Disposition: A | Discharge status: Home / Self Care | Payer: 59 | Source: Ambulatory Visit | Attending: Neurological Surgery | Admitting: Neurological Surgery

## 2011-11-11 DIAGNOSIS — M545 Low back pain: Secondary | ICD-10-CM

## 2011-11-28 ENCOUNTER — Encounter: Payer: Self-pay | Admitting: Internal Medicine

## 2013-08-14 ENCOUNTER — Encounter: Payer: Self-pay | Admitting: Internal Medicine

## 2013-08-14 ENCOUNTER — Ambulatory Visit (INDEPENDENT_AMBULATORY_CARE_PROVIDER_SITE_OTHER): Payer: 59 | Admitting: Internal Medicine

## 2013-08-14 VITALS — BP 120/74 | HR 86 | Temp 98.1°F | Wt 229.8 lb

## 2013-08-14 DIAGNOSIS — M48061 Spinal stenosis, lumbar region without neurogenic claudication: Secondary | ICD-10-CM

## 2013-08-14 DIAGNOSIS — M79609 Pain in unspecified limb: Secondary | ICD-10-CM

## 2013-08-14 MED ORDER — PREDNISONE 10 MG PO TABS: 10.0000 mg | ORAL_TABLET | Freq: Every day | ORAL | Status: DC

## 2013-08-14 NOTE — Patient Instructions (Addendum)
1. Leg pain and question of disc disease and nerve impingement: on exam there is no evidence of a moderate or severe nerve impingement. Thus no need for neurosurgical consultation. With multi -level disc disease epidural steroid injection treatment may prove difficult and would require a repeat MRI (last study in '12).  Plan  Prednisone burst and taper to help relieve any inflammation/swelling that could be the cause of the lower leg pain.  2. Swollen leg - concern for a possible deep vein thrombosis (blood clot in the calve).  Plan  Xeralto - one tonight and one in the AM  Will arrange for a lower extremity venous doppler study (ultrasound) to rule out a blood clot for tomorrow.   May want to consider having a routine general physical exam when the dust settles.

## 2013-08-14 NOTE — Progress Notes (Signed)
  Subjective:    Patient ID: Juan Richards, male    DOB: 1956-07-22, 57 y.o.   MRN: [id number]  HPI Juan Richards was last seen by Jadda Hunsucker in 2011. He has h/o bulging discs in the past and was seen by Dr. Yetta Barre at [place]. He was moving his daughter a week ago and he the onset of severe pain in the right leg: knee with radiation to calve and lower leg. He has pain when supine as well as when he is up. He has been taking ibuprofen 800 mg which does help. He also has leg swelling right. The leg itself is sore.   Past Medical History  Diagnosis Date  . DIVERTICULITIS, COLON, WITH PERFORATION 07/23/2009  . EPIDIDYMAL CYST 08/31/2007  . 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 PLEURISY    History reviewed. No pertinent past surgical history. Family History  Problem Relation Age of Onset  . Diabetes Mother   . Kidney disease Mother   . Cancer Father     ?   History   Social History  . Marital Status: Married    Spouse Name: N/A    Number of Children: N/A  . Years of Education: N/A   Occupational History  . Not on file.   Social History Main Topics  . Smoking status: Never Smoker   . Smokeless tobacco: Not on file  . Alcohol Use: Yes  . Drug Use: No  . Sexual Activity: Not on file   Other Topics Concern  . Not on file   Social History Narrative  . No narrative on file    No current outpatient prescriptions on file prior to visit.   No current facility-administered medications on file prior to visit.      Review of Systems System review is negative for any constitutional, cardiac, pulmonary, GI or neuro symptoms or complaints other than as described in the HPI.     Objective:   Physical Exam Filed Vitals:   08/14/13 1614  BP: 120/74  Pulse: 86  Temp: 98.1 F (36.7 C)   gen'l WNWD AA man in no distress HEENT- C&S clear Cor 2+ radial pulse, regular Pulm - normal respirations Back exam: normal stand; normal flex to greater than 100 degrees; abnormal gait favoring right  leg; normal toe/heel walk; normal step up to exam table; normal SLR sitting; normal DTR at the left patellar tendon, no DTR right patellar tendon; normal sensation to light touch, pin-prick and deep vibratory stimulus; no  CVA tenderness; able to move supine to sitting witout assistance. Ext - right calve 1 inch larger than the left, mild tenderness to palpation right calve        Assessment & Plan:  Swollen leg, right - concern for a possible deep vein thrombosis (blood clot in the calve).  Plan  Xeralto 15mg  - one tonight and one in the AM  Will arrange for a lower extremity venous doppler study (ultrasound) to rule out a blood clot for tomorrow - 10:30 at [facility].

## 2013-08-14 NOTE — Assessment & Plan Note (Signed)
Distal right leg pain. Exam w/o radicular findings. May be mild inflammtion with nerve root impingement L-4,5, L5-S1  Plan R/o DVT as source of pain  Prednisone burst and taper. If no relief and no clot - repeat MRI for possible ESI

## 2013-08-15 ENCOUNTER — Telehealth: Payer: Self-pay

## 2013-08-15 ENCOUNTER — Telehealth: Payer: Self-pay | Admitting: Internal Medicine

## 2013-08-15 ENCOUNTER — Ambulatory Visit (HOSPITAL_COMMUNITY)
Admission: RE | Admit: 2013-08-15 | Discharge: 2013-08-15 | Disposition: A | Discharge status: Home / Self Care | Payer: 59 | Source: Ambulatory Visit | Attending: Internal Medicine | Admitting: Internal Medicine

## 2013-08-15 ENCOUNTER — Other Ambulatory Visit (HOSPITAL_COMMUNITY): Payer: Self-pay | Admitting: Internal Medicine

## 2013-08-15 DIAGNOSIS — M79609 Pain in unspecified limb: Secondary | ICD-10-CM

## 2013-08-15 DIAGNOSIS — M25561 Pain in right knee: Secondary | ICD-10-CM

## 2013-08-15 DIAGNOSIS — M7989 Other specified soft tissue disorders: Secondary | ICD-10-CM

## 2013-08-15 NOTE — Telephone Encounter (Signed)
Spoke with pt. No DVT. If leg pain doesn't continue to improve will move ahead with MRI - he will contact the office next tuesday

## 2013-08-15 NOTE — Progress Notes (Signed)
Right lower extremity venous duplex completed.  Right:  No evidence of DVT, superficial thrombosis, or Baker's cyst.  Left:  Negative for DVT in the common femoral vein.  

## 2013-08-15 NOTE — Telephone Encounter (Signed)
Phone call and message left letting patient know of his 10:15 appt with vascular at [facility]

## 2014-05-20 ENCOUNTER — Ambulatory Visit: Payer: 59 | Admitting: Internal Medicine

## 2014-05-21 ENCOUNTER — Ambulatory Visit (INDEPENDENT_AMBULATORY_CARE_PROVIDER_SITE_OTHER): Payer: 59 | Admitting: Internal Medicine

## 2014-05-21 ENCOUNTER — Encounter: Payer: Self-pay | Admitting: Internal Medicine

## 2014-05-21 VITALS — BP 120/70 | HR 78 | Temp 98.2°F | Resp 14 | Ht 70.0 in | Wt 221.4 lb

## 2014-05-21 DIAGNOSIS — M67439 Ganglion, unspecified wrist: Secondary | ICD-10-CM

## 2014-05-21 DIAGNOSIS — R1032 Left lower quadrant pain: Secondary | ICD-10-CM

## 2014-05-21 DIAGNOSIS — M6740 Ganglion, unspecified site: Secondary | ICD-10-CM

## 2014-05-21 NOTE — Progress Notes (Signed)
Pre visit review using our clinic review tool, if applicable. No additional management support is needed unless otherwise documented below in the visit note. 

## 2014-05-21 NOTE — Patient Instructions (Signed)
If your symptoms persist or progress; I would recommend seeing Dr Gardenia Phlegm ,Sports Medicine. Use an anti-inflammatory cream such as Aspercreme or Zostrix cream twice a day to the affected area as needed. In lieu of this warm moist compresses or  hot water bottle can be used. Do not apply ice . Stay on clear liquids for 48-72 hours or until bowels are normal.This would include  jello, sherbert (NOT ice cream), Lipton's chicken noodle soup(NOT cream based soups),Gatorade Lite, flat Ginger ale (without High Fructose Corn Syrup),dry toast or crackers, baked potato.No milk , dairy or grease until bowels are formed. Align , a [employer] , daily if stools are loose. Immodium AD for frankly watery stool. Report increasing pain, fever or rectal bleeding

## 2014-05-21 NOTE — Progress Notes (Signed)
   Subjective:    Patient ID: Juan Richards, male    DOB: 11-26-1956, 58 y.o.   MRN: [id number]  HPI  His symptoms began 05/12/14 as pain on the ventral surface of the right medial wrist. He noted an enlargement of the distal wrist with some hyperpigmentation. Pain was described as up to a level X. Symptoms were worse with repetitive lifting. He also developed pain at the right elbow with radiation to ais high as the shoulder  He took 2 ibuprofen 800 mg with significant improvement in the symptoms. His pain is now a level III. The size of lesion has also decreased significantly.   Review of Systems He developed some left lower quadrant discomfort last night after eating taco shells. He does have a history of diverticulitis  Since last night he has had loose to watery stools. He denies associated fever, chills, rectal bleeding, or melena he has had occasional GI bleeding from hemorrhoids.        Objective:   Physical Exam  He appears healthy well-nourished in no distress  He has no scleral icterus  Has no lymphadenopathy about the neck or axilla  He has a regular rhythm without murmur or gallop.  There is no increased work of breathing; chest is clear  There is slight tenderness in left lower quadrant but no significant rebound.  A small ganglion is suggested at the radial aspect of the right wrist. This is minimally tender to palpation.  There is no evidence clinically of  R elbow tenosynovitis.  Strength, tone, deep tendon reflexes are normal.        Assessment & Plan:  #1 cystic lesion right radial wrist with pain.  A ganglion which has decreased in size is suggested.  #2 pain at his elbow and shoulder probably related to his changing how he lifts repetitively causing olecranon tenosynovitis.  #3 He has some abdominal discomfort with bowel changes. Clinically there is no evidence of significant diverticulitis  See orders and after visit summary.

## 2014-07-04 ENCOUNTER — Encounter: Payer: Self-pay | Admitting: Gastroenterology

## 2014-08-22 ENCOUNTER — Ambulatory Visit: Payer: 59 | Admitting: Internal Medicine

## 2015-03-16 ENCOUNTER — Encounter: Payer: Self-pay | Admitting: Gastroenterology

## 2015-03-17 ENCOUNTER — Encounter: Payer: Self-pay | Admitting: Gastroenterology

## 2015-06-20 ENCOUNTER — Ambulatory Visit (INDEPENDENT_AMBULATORY_CARE_PROVIDER_SITE_OTHER): Payer: Commercial Managed Care - HMO | Admitting: Family Medicine

## 2015-06-20 VITALS — BP 124/86 | HR 72 | Temp 98.6°F | Resp 15 | Ht 69.5 in | Wt 225.0 lb

## 2015-06-20 DIAGNOSIS — T63441A Toxic effect of venom of bees, accidental (unintentional), initial encounter: Secondary | ICD-10-CM | POA: Diagnosis not present

## 2015-06-20 DIAGNOSIS — H6121 Impacted cerumen, right ear: Secondary | ICD-10-CM | POA: Diagnosis not present

## 2015-06-20 MED ORDER — DIPHENHYDRAMINE HCL 50 MG/ML IJ SOLN
50.0000 mg | Freq: Once | INTRAMUSCULAR | Status: AC
  Administered 2015-06-20: 50 mg via INTRAMUSCULAR

## 2015-06-20 MED ORDER — CEPHALEXIN 500 MG PO CAPS: 500.0000 mg | ORAL_CAPSULE | Freq: Two times a day (BID) | ORAL | Status: DC

## 2015-06-20 MED ORDER — PREDNISONE 20 MG PO TABS: ORAL_TABLET | ORAL | Status: DC

## 2015-06-20 NOTE — Progress Notes (Addendum)
Subjective:  This chart was scribed for Juan Ray, MD by Thea Alken, ED Scribe. This patient was seen in room 3 and the patient's care was started at 9:26 AM.   Patient ID: Juan Richards, male    DOB: 08/12/1956, 59 y.o.   MRN: [id number]  HPI   Chief Complaint  Patient presents with  . bee sting    Cutting grass yesterday and got stung in hand, left hand  . Arm Swelling    left arm   HPI Comments: Juan Richards is a 59 y.o. male who presents to the [facility] complaining of a bee sting to left hand that occurred yesterday. Pt states a yellow jack stung him through his glove yesterday around 12pm. He experienced some swelling later that evening and took 2 doses of benadryl and applied ice to left hand. States he woke of this morning with worsening swelling spreading up left arm and warmth to left hand but denies taking benadryl or applying ice prior to being seen. He denies fevers, chills, trouble breathing and trouble swallowing. He denies hx of htn but reports family hx of htn. Pt drove himself here today but is calling his wife to come pick him up  Pt is also request to flush out right ear due to ear wax build up causing his ear to feel blocked. Pt has tried OTC ear wax removal without relief. He denies otalgia.  Patient Active Problem List   Diagnosis Date Noted  . LEG PAIN, RIGHT 11/30/2010  . DIVERTICULITIS, COLON, WITH PERFORATION 07/23/2009  . GROSS HEMATURIA 07/23/2009  . CONSTIPATION 09/27/2007  . 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 PLEURISY 08/31/2007  . EPIDIDYMAL CYST 08/31/2007   Past Medical History  Diagnosis Date  . DIVERTICULITIS, COLON, WITH PERFORATION 07/23/2009  . EPIDIDYMAL CYST 08/31/2007  . 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 PLEURISY    History reviewed. No pertinent past surgical history. No Known Allergies Prior to Admission medications   Not on File   History   Social History  . Marital Status: Married   Spouse Name: N/A  . Number of Children: N/A  . Years of Education: N/A   Occupational History  . Not on file.   Social History Main Topics  . Smoking status: Never Smoker   . Smokeless tobacco: Not on file  . Alcohol Use: Yes  . Drug Use: No  . Sexual Activity: Not on file   Other Topics Concern  . Not on file   Social History Narrative   Review of Systems  Constitutional: Negative for chills and fatigue.  HENT: Negative for ear pain and trouble swallowing.   Respiratory: Negative for apnea, shortness of breath and wheezing.   Gastrointestinal: Negative for nausea and vomiting.   Objective:   Physical Exam  Constitutional: He is oriented to person, place, and time. He appears well-developed and well-nourished.  HENT:  Head: Normocephalic and atraumatic.  Cerumen impaction of right ear  Eyes: EOM are normal. Pupils are equal, round, and reactive to light.  Neck: No JVD present. Carotid bruit is not present.  Cardiovascular: Normal rate, regular rhythm and normal heart sounds.  Exam reveals no gallop and no friction rub.   No murmur heard. Pulmonary/Chest: Effort normal and breath sounds normal. No respiratory distress. He has no wheezes. He has no rales. He exhibits no tenderness.  Musculoskeletal: He exhibits no edema.  Neurological: He is alert and oriented to person, place, and time.  Skin: Skin is warm and dry.  Left hand- diffuse swelling over dorsum extending to the fingers through the wrist and half way up the forearm with faint erythema. Small scab to site of the sting over 4th metacarpal with slight abrasion to area. NVI distally into fingertip that are warm. Cap < 1 sec. Tenderness in linear fashion slightly proximal to swollen area but no erythematous streaks noted.   Psychiatric: He has a normal mood and affect.  Vitals reviewed.  Filed Vitals:   06/20/15 0921  BP: 147/117  Pulse: 81  Temp: 98 F (36.7 C)  TempSrc: Oral  Resp: 16  SpO2: 99%   Assessment  & Plan:   Juan Richards is a 59 y.o. male Bee sting reaction, accidental or unintentional, initial encounter - Plan: diphenhydrAMINE (BENADRYL) injection 50 mg, predniSONE (DELTASONE) 20 MG tablet, Local reaction to bee sting, accidental or unintentional, initial encounter - Plan: predniSONE (DELTASONE) 20 MG tablet, cephALEXin (KEFLEX) 500 MG capsule   -Suspect yellowjacket sting. Now with large local reaction.  No systemic symptoms no respiratory or throat symptoms. Benadryl 50 mg IM given in office, then prednisone 60 mg today 40 mg tomorrow,  20 mg in 2 days for LLR. Some slight discomfort proximal to swelling which may be some lymphangitis. This can occur with bee stings and less likely cellulitis, but if swelling, redness and warmth are not improved tomorrow can start printed prescription for Keflex. Return to clinic precautions discussed including if not improving the next 2 days. Sooner or to the ER if worse   Cerumen impaction, right  - Lavage performed in office with resolution. Return to clinic precautions and avoidance measures in the future on AVS.  Meds ordered this encounter  Medications  . diphenhydrAMINE (BENADRYL) injection 50 mg    Sig:   . predniSONE (DELTASONE) 20 MG tablet    Sig: 3 tabs by mouth today, then 2 tabs by mouth once tomorrow, then 1 tab by mouth once in 2 days.    Dispense:  6 tablet    Refill:  0  . cephALEXin (KEFLEX) 500 MG capsule    Sig: Take 1 capsule (500 mg total) by mouth 2 (two) times daily.    Dispense:  20 capsule    Refill:  0   Patient Instructions  Swelling warmth and redness appears to be due to a large local reaction from the bee sting. Keep hand and arm elevated, ice on and off for 15 minutes as needed today. Benadryl 50 mg was given in the office, in 4-6 hours you can continue to take Benadryl 25 mg tablets 1-2 every fours to 6 hours as needed for itching or swelling. Start prednisone today, and will wean to lower doses over the next 2  days. This should also help with the swelling.  If the redness, warmth, and swelling are not improved tomorrow I did print out a prescription for an antibiotic, but infection after a bee sting is much less likely.  If you are not improving in 2 days - return for recheck, or if any worsening sooner return here or the emergency room as discussed.   For the wax in the ear, this was flushed today, but see information below to help lessen the chance of this in the future. If it does recur, you can try an over-the-counter treatment called the bronchus. If this does not help return to have procedure performed today.  If any pain, discharge,  or swelling of the ear  after procedure today return here or emergency room.  Return to the clinic or go to the nearest emergency room if any of your symptoms worsen or new symptoms occur.     Bee, Wasp, or Hornet Sting Your caregiver has diagnosed you as having an insect sting. An insect sting appears as a red lump in the skin that sometimes has a tiny hole in the center, or it may have a stinger in the center of the wound. The most common stings are from wasps, hornets and bees. Individuals have different reactions to insect stings.  A normal reaction may cause pain, swelling, and redness around the sting site.  A localized allergic reaction may cause swelling and redness that extends beyond the sting site.  A large local reaction may continue to develop over the next 12 to 36 hours.  On occasion, the reactions can be severe (anaphylactic reaction). An anaphylactic reaction may cause wheezing; difficulty breathing; chest pain; fainting; raised, itchy, red patches on the skin; a sick feeling to your stomach (nausea); vomiting; cramping; or diarrhea. If you have had an anaphylactic reaction to an insect sting in the past, you are more likely to have one again. HOME CARE INSTRUCTIONS   With bee stings, a small sac of poison is left in the wound. Brushing across this  with something such as a credit card, or anything similar, will help remove this and decrease the amount of the reaction. This same procedure will not help a wasp sting as they do not leave behind a stinger and poison sac.  Apply a cold compress for 10 to 20 minutes every hour for 1 to 2 days, depending on severity, to reduce swelling and itching.  To lessen pain, a paste made of water and baking soda may be rubbed on the bite or sting and left on for 5 minutes.  To relieve itching and swelling, you may use take medication or apply medicated creams or lotions as directed.  Only take over-the-counter or prescription medicines for pain, discomfort, or fever as directed by your caregiver.  Wash the sting site daily with soap and water. Apply antibiotic ointment on the sting site as directed.  If you suffered a severe reaction:  If you did not require hospitalization, an adult will need to stay with you for 24 hours in case the symptoms return.  You may need to wear a medical bracelet or necklace stating the allergy.  You and your family need to learn when and how to use an anaphylaxis kit or epinephrine injection.  If you have had a severe reaction before, always carry your anaphylaxis kit with you. SEEK MEDICAL CARE IF:   None of the above helps within 2 to 3 days.  The area becomes red, warm, tender, and swollen beyond the area of the bite or sting.  You have an oral temperature above 102 F (38.9 C). SEEK IMMEDIATE MEDICAL CARE IF:  You have symptoms of an allergic reaction which are:  Wheezing.  Difficulty breathing.  Chest pain.  Lightheadedness or fainting.  Itchy, raised, red patches on the skin.  Nausea, vomiting, cramping or diarrhea. ANY OF THESE SYMPTOMS MAY REPRESENT A SERIOUS PROBLEM THAT IS AN EMERGENCY. Do not wait to see if the symptoms will go away. Get medical help right away. Call your local emergency services (911 in [place] DO NOT drive yourself to the  hospital. MAKE SURE YOU:   Understand these instructions.  Will watch your condition.  Will get help  right away if you are not doing well or get worse. Document Released: 12/05/2005 Document Revised: 02/27/2012 Document Reviewed: 05/22/2010 [facility] Patient Information 2015 [place], [place]. This information is not intended to replace advice given to you by your health care provider. Make sure you discuss any questions you have with your health care provider.   Cerumen Impaction A cerumen impaction is when the wax in your ear forms a plug. This plug usually causes reduced hearing. Sometimes it also causes an earache or dizziness. Removing a cerumen impaction can be difficult and painful. The wax sticks to the ear canal. The canal is sensitive and bleeds easily. If you try to remove a heavy wax buildup with a cotton tipped swab, you may push it in further. Irrigation with water, suction, and small ear curettes may be used to clear out the wax. If the impaction is fixed to the skin in the ear canal, ear drops may be needed for a few days to loosen the wax. People who build up a lot of wax frequently can use ear wax removal products available in your local drugstore. SEEK MEDICAL CARE IF:  You develop an earache, increased hearing loss, or marked dizziness. Document Released: 01/12/2005 Document Revised: 02/27/2012 Document Reviewed: 03/04/2010 [facility] Patient Information 2015 [place], [place]. This information is not intended to replace advice given to you by your health care provider. Make sure you discuss any questions you have with your health care provider.       I personally performed the services described in this documentation, which was scribed in my presence. The recorded information has been reviewed and considered, and addended by me as needed.

## 2015-06-20 NOTE — Patient Instructions (Addendum)
Swelling warmth and redness appears to be due to a large local reaction from the bee sting. Keep hand and arm elevated, ice on and off for 15 minutes as needed today. Benadryl 50 mg was given in the office, in 4-6 hours you can continue to take Benadryl 25 mg tablets 1-2 every fours to 6 hours as needed for itching or swelling. Start prednisone today, and will wean to lower doses over the next 2 days. This should also help with the swelling.  If the redness, warmth, and swelling are not improved tomorrow I did print out a prescription for an antibiotic, but infection after a bee sting is much less likely.  If you are not improving in 2 days - return for recheck, or if any worsening sooner return here or the emergency room as discussed.   For the wax in the ear, this was flushed today, but see information below to help lessen the chance of this in the future. If it does recur, you can try an over-the-counter treatment called the bronchus. If this does not help return to have procedure performed today.  If any pain, discharge,  or swelling of the ear after procedure today return here or emergency room.  Return to the clinic or go to the nearest emergency room if any of your symptoms worsen or new symptoms occur.     Bee, Wasp, or Hornet Sting Your caregiver has diagnosed you as having an insect sting. An insect sting appears as a red lump in the skin that sometimes has a tiny hole in the center, or it may have a stinger in the center of the wound. The most common stings are from wasps, hornets and bees. Individuals have different reactions to insect stings.  A normal reaction may cause pain, swelling, and redness around the sting site.  A localized allergic reaction may cause swelling and redness that extends beyond the sting site.  A large local reaction may continue to develop over the next 12 to 36 hours.  On occasion, the reactions can be severe (anaphylactic reaction). An anaphylactic reaction  may cause wheezing; difficulty breathing; chest pain; fainting; raised, itchy, red patches on the skin; a sick feeling to your stomach (nausea); vomiting; cramping; or diarrhea. If you have had an anaphylactic reaction to an insect sting in the past, you are more likely to have one again. HOME CARE INSTRUCTIONS   With bee stings, a small sac of poison is left in the wound. Brushing across this with something such as a credit card, or anything similar, will help remove this and decrease the amount of the reaction. This same procedure will not help a wasp sting as they do not leave behind a stinger and poison sac.  Apply a cold compress for 10 to 20 minutes every hour for 1 to 2 days, depending on severity, to reduce swelling and itching.  To lessen pain, a paste made of water and baking soda may be rubbed on the bite or sting and left on for 5 minutes.  To relieve itching and swelling, you may use take medication or apply medicated creams or lotions as directed.  Only take over-the-counter or prescription medicines for pain, discomfort, or fever as directed by your caregiver.  Wash the sting site daily with soap and water. Apply antibiotic ointment on the sting site as directed.  If you suffered a severe reaction:  If you did not require hospitalization, an adult will need to stay with you for 24  hours in case the symptoms return.  You may need to wear a medical bracelet or necklace stating the allergy.  You and your family need to learn when and how to use an anaphylaxis kit or epinephrine injection.  If you have had a severe reaction before, always carry your anaphylaxis kit with you. SEEK MEDICAL CARE IF:   None of the above helps within 2 to 3 days.  The area becomes red, warm, tender, and swollen beyond the area of the bite or sting.  You have an oral temperature above 102 F (38.9 C). SEEK IMMEDIATE MEDICAL CARE IF:  You have symptoms of an allergic reaction which  are:  Wheezing.  Difficulty breathing.  Chest pain.  Lightheadedness or fainting.  Itchy, raised, red patches on the skin.  Nausea, vomiting, cramping or diarrhea. ANY OF THESE SYMPTOMS MAY REPRESENT A SERIOUS PROBLEM THAT IS AN EMERGENCY. Do not wait to see if the symptoms will go away. Get medical help right away. Call your local emergency services (911 in [place] DO NOT drive yourself to the hospital. MAKE SURE YOU:   Understand these instructions.  Will watch your condition.  Will get help right away if you are not doing well or get worse. Document Released: 12/05/2005 Document Revised: 02/27/2012 Document Reviewed: 05/22/2010 [facility] Patient Information 2015 [place], [place]. This information is not intended to replace advice given to you by your health care provider. Make sure you discuss any questions you have with your health care provider.   Cerumen Impaction A cerumen impaction is when the wax in your ear forms a plug. This plug usually causes reduced hearing. Sometimes it also causes an earache or dizziness. Removing a cerumen impaction can be difficult and painful. The wax sticks to the ear canal. The canal is sensitive and bleeds easily. If you try to remove a heavy wax buildup with a cotton tipped swab, you may push it in further. Irrigation with water, suction, and small ear curettes may be used to clear out the wax. If the impaction is fixed to the skin in the ear canal, ear drops may be needed for a few days to loosen the wax. People who build up a lot of wax frequently can use ear wax removal products available in your local drugstore. SEEK MEDICAL CARE IF:  You develop an earache, increased hearing loss, or marked dizziness. Document Released: 01/12/2005 Document Revised: 02/27/2012 Document Reviewed: 03/04/2010 [facility] Patient Information 2015 [place], [place]. This information is not intended to replace advice given to you by your health care provider. Make sure you  discuss any questions you have with your health care provider.

## 2015-12-20 HISTORY — PX: BACK SURGERY: SHX140

## 2016-08-08 ENCOUNTER — Emergency Department (HOSPITAL_COMMUNITY): Payer: Commercial Managed Care - HMO

## 2016-08-08 ENCOUNTER — Encounter (HOSPITAL_COMMUNITY): Payer: Self-pay | Admitting: Emergency Medicine

## 2016-08-08 ENCOUNTER — Emergency Department (HOSPITAL_COMMUNITY)
Admission: EM | Admit: 2016-08-08 | Discharge: 2016-08-08 | Disposition: A | Discharge status: Home / Self Care | Payer: Commercial Managed Care - HMO | Attending: Emergency Medicine | Admitting: Emergency Medicine

## 2016-08-08 DIAGNOSIS — M5442 Lumbago with sciatica, left side: Secondary | ICD-10-CM | POA: Insufficient documentation

## 2016-08-08 DIAGNOSIS — M5432 Sciatica, left side: Secondary | ICD-10-CM

## 2016-08-08 DIAGNOSIS — M5431 Sciatica, right side: Secondary | ICD-10-CM

## 2016-08-08 DIAGNOSIS — M5441 Lumbago with sciatica, right side: Secondary | ICD-10-CM

## 2016-08-08 DIAGNOSIS — M545 Low back pain: Secondary | ICD-10-CM | POA: Diagnosis present

## 2016-08-08 MED ORDER — METHYLPREDNISOLONE 4 MG PO TBPK: ORAL_TABLET | ORAL | 0 refills | Status: DC

## 2016-08-08 MED ORDER — HYDROCODONE-ACETAMINOPHEN 5-325 MG PO TABS: 2.0000 | ORAL_TABLET | ORAL | 0 refills | Status: DC | PRN

## 2016-08-08 MED ORDER — KETOROLAC TROMETHAMINE 60 MG/2ML IM SOLN
60.0000 mg | Freq: Once | INTRAMUSCULAR | Status: AC
  Administered 2016-08-08: 60 mg via INTRAMUSCULAR
  Filled 2016-08-08: qty 2

## 2016-08-08 MED ORDER — NAPROXEN 500 MG PO TABS: 500.0000 mg | ORAL_TABLET | Freq: Two times a day (BID) | ORAL | 0 refills | Status: AC

## 2016-08-08 MED ORDER — HYDROCODONE-ACETAMINOPHEN 5-325 MG PO TABS
2.0000 | ORAL_TABLET | Freq: Once | ORAL | Status: AC
  Administered 2016-08-08: 2 via ORAL
  Filled 2016-08-08: qty 2

## 2016-08-08 MED ORDER — CYCLOBENZAPRINE HCL 10 MG PO TABS: 10.0000 mg | ORAL_TABLET | Freq: Three times a day (TID) | ORAL | 0 refills | Status: DC | PRN

## 2016-08-08 MED ORDER — METHOCARBAMOL 500 MG PO TABS
1000.0000 mg | ORAL_TABLET | Freq: Once | ORAL | Status: AC
  Administered 2016-08-08: 1000 mg via ORAL
  Filled 2016-08-08: qty 2

## 2016-08-08 NOTE — ED Provider Notes (Signed)
[facility] DEPT Provider Note   CSN: [id number] Arrival date & time: 08/08/16  [id number]     History   Chief Complaint Chief Complaint  Patient presents with  . Back Pain  . Leg Pain    HPI Juan Richards is a 60 y.o. male.  HPI 60 year old male with past medical history of lumbar spinal stenosis who presents with bilateral lower back pain. Patient states that over the last month he has been in a low riding rental vehicle. Every time he gets up he feels a twinge in his back. Over the last several days he's had progressively worsening bilateral paraspinal lower back pain. He describes the pain as an aching and gnawing sensation. Occasionally he gets sharp shooting electrical type pains that radiate down both buttocks to the posterior knee. Denies any numbness or weakness. No loss of bowel or bladder function. Pain is made worse with certain positions. Denies any alleviating factors. No fevers or chills. No history of IV drug use.   Past Medical History:  Diagnosis Date  . DIVERTICULITIS, COLON, WITH PERFORATION 07/23/2009  . EPIDIDYMAL CYST 08/31/2007  . PLEURISY   . 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 LEG PAIN, RIGHT 11/30/2010  . DIVERTICULITIS, COLON, WITH PERFORATION 07/23/2009  . GROSS HEMATURIA 07/23/2009  . CONSTIPATION 09/27/2007  . 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 PLEURISY 08/31/2007  . EPIDIDYMAL CYST 08/31/2007    No past surgical history on file.     Home Medications    Prior to Admission medications   Medication Sig Start Date End Date Taking? Authorizing Provider  cephALEXin (KEFLEX) 500 MG capsule Take 1 capsule (500 mg total) by mouth 2 (two) times daily. 06/20/15   Wendie Agreste, MD  cyclobenzaprine (FLEXERIL) 10 MG tablet Take 1 tablet (10 mg total) by mouth 3 (three) times daily as needed for muscle spasms. 08/08/16   Duffy Bruce, MD  HYDROcodone-acetaminophen (NORCO/VICODIN) 5-325 MG  tablet Take 2 tablets by mouth every 4 (four) hours as needed for severe pain. 08/08/16   Duffy Bruce, MD  methylPREDNISolone (MEDROL DOSEPAK) 4 MG TBPK tablet Take as per package insert 08/08/16   Duffy Bruce, MD  naproxen (NAPROSYN) 500 MG tablet Take 1 tablet (500 mg total) by mouth 2 (two) times daily. 08/08/16 08/15/16  Duffy Bruce, MD  predniSONE (DELTASONE) 20 MG tablet 3 tabs by mouth today, then 2 tabs by mouth once tomorrow, then 1 tab by mouth once in 2 days. 06/20/15   Wendie Agreste, MD    Family History Family History  Problem Relation Age of Onset  . Diabetes Mother   . Kidney disease Mother   . Cancer Father     ?    Social History Social History  Substance Use Topics  . Smoking status: Never Smoker  . Smokeless tobacco: Never Used  . Alcohol use Yes     Allergies   Review of patient's allergies indicates no known allergies.   Review of Systems Review of Systems  Constitutional: Negative for chills, fatigue and fever.  HENT: Negative for congestion and rhinorrhea.   Eyes: Negative for visual disturbance.  Respiratory: Negative for cough, shortness of breath and wheezing.   Cardiovascular: Negative for chest pain and leg swelling.  Gastrointestinal: Negative for abdominal pain, diarrhea, nausea and vomiting.  Genitourinary: Negative for dysuria and flank pain.  Musculoskeletal: Positive for back pain and gait problem (Due to pain). Negative for neck  pain and neck stiffness.  Skin: Negative for rash and wound.  Allergic/Immunologic: Negative for immunocompromised state.  Neurological: Negative for syncope, weakness and headaches.  All other systems reviewed and are negative.    Physical Exam Updated Vital Signs BP 123/92   Pulse 61   Temp 97.6 F (36.4 C) (Oral)   Resp 18   Ht 5\' 10"  (1.778 m)   Wt 228 lb (103.4 kg)   SpO2 99%   BMI 32.71 kg/m   Physical Exam  Constitutional: He is oriented to person, place, and time. He appears  well-developed and well-nourished. No distress.  HENT:  Head: Normocephalic and atraumatic.  Eyes: Conjunctivae are normal.  Neck: Neck supple.  Cardiovascular: Normal rate, regular rhythm and normal heart sounds.  Exam reveals no friction rub.   No murmur heard. Pulmonary/Chest: Effort normal and breath sounds normal. No respiratory distress. He has no wheezes. He has no rales.  Abdominal: He exhibits no distension.  Musculoskeletal: He exhibits no edema.  Neurological: He is alert and oriented to person, place, and time. He exhibits normal muscle tone.  Skin: Skin is warm. Capillary refill takes less than 2 seconds.  Psychiatric: He has a normal mood and affect.  Nursing note and vitals reviewed.   Spine Exam: Inspection/Palpation: Moderate tenderness to palpation over the bilateral lower paraspinal areas. No midline tenderness. Strength: 5/5 throughout LE bilaterally (hip flexion/extension, adduction/abduction; knee flexion/extension; foot dorsiflexion/plantarflexion, inversion/eversion; great toe inversion) Sensation: Intact to light touch in proximal and distal LE bilaterally Reflexes: 2+ quadriceps and achilles reflexes  ED Treatments / Results  Labs (all labs ordered are listed, but only abnormal results are displayed) Labs Reviewed - No data to display  EKG  EKG Interpretation None       Radiology Dg Lumbar Spine Complete  Result Date: 08/08/2016 CLINICAL DATA:  Worsening low back pain for 3 weeks EXAM: LUMBAR SPINE - COMPLETE 4+ VIEW COMPARISON:  11/11/2011 FINDINGS: No vertebral compression deformity. Severe disc space narrowing at L2-3, L3-4, L4-5, and L5-S1 is not significantly changed. Disc height is relatively maintained at L1-2. There is facet arthropathy at L4-5 and L5-S1. IMPRESSION: No acute bony pathology.  Degenerative change. Electronically Signed   By: Marybelle Killings M.D.   On: 08/08/2016 07:55    Procedures Procedures (including critical care  time)  Medications Ordered in ED Medications  ketorolac (TORADOL) injection 60 mg (60 mg Intramuscular Given 08/08/16 0810)  methocarbamol (ROBAXIN) tablet 1,000 mg (1,000 mg Oral Given 08/08/16 0810)  HYDROcodone-acetaminophen (NORCO/VICODIN) 5-325 MG per tablet 2 tablet (2 tablets Oral Given 08/08/16 0809)     Initial Impression / Assessment and Plan / ED Course  I have reviewed the triage vital signs and the nursing notes.  Pertinent labs & imaging results that were available during my care of the patient were reviewed by me and considered in my medical decision making (see chart for details).  Clinical Course  60 year old male with past medical history of chronic lower back pain and spinal stenosis who presents with bilateral aching pain with intermittent shooting pains in his bilateral legs. 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 He has no red flag symptoms. He has no loss of bowel or bladder function, lower extremity weakness or numbness, or signs of cauda equina. He has no fevers, chills, or signs of osteomyelitis, epidural abscess, and he denies any IV drug use or risk factors for this. No recent instrumentation. No trauma to the area. Plain  film showed no acute fracture. He has a known trigger of getting in and out of a low vehicle. Will treat with NSAIDs, short course of analgesics ([place] database reviewed), steroids, and outpatient referral. Return precautions given.  Final Clinical Impressions(s) / ED Diagnoses   Final diagnoses:  Bilateral sciatica  Bilateral low back pain with sciatica, sciatica laterality unspecified    New Prescriptions Discharge Medication List as of 08/08/2016  8:16 AM    START taking these medications   Details  cyclobenzaprine (FLEXERIL) 10 MG tablet Take 1 tablet (10 mg total) by mouth 3 (three) times daily as needed for muscle spasms., Starting Mon 08/08/2016, Print    HYDROcodone-acetaminophen  (NORCO/VICODIN) 5-325 MG tablet Take 2 tablets by mouth every 4 (four) hours as needed for severe pain., Starting Mon 08/08/2016, Print    methylPREDNISolone (MEDROL DOSEPAK) 4 MG TBPK tablet Take as per package insert, Print    naproxen (NAPROSYN) 500 MG tablet Take 1 tablet (500 mg total) by mouth 2 (two) times daily., Starting Mon 08/08/2016, Until Mon 08/15/2016, Print         Duffy Bruce, MD 08/08/16 [phone]

## 2016-08-08 NOTE — ED Triage Notes (Signed)
Pt reports pain in lower back with radiation into both legs that has been ongoing for the last 3 weeks. Pt denies any difficulty with urination or bowel control.

## 2016-08-11 ENCOUNTER — Encounter: Payer: Self-pay | Admitting: Family

## 2016-08-11 ENCOUNTER — Ambulatory Visit (INDEPENDENT_AMBULATORY_CARE_PROVIDER_SITE_OTHER): Payer: Commercial Managed Care - HMO | Admitting: Family

## 2016-08-11 ENCOUNTER — Other Ambulatory Visit (INDEPENDENT_AMBULATORY_CARE_PROVIDER_SITE_OTHER): Payer: Commercial Managed Care - HMO

## 2016-08-11 VITALS — BP 118/88 | HR 62 | Temp 97.9°F | Resp 16 | Ht 70.0 in | Wt 230.0 lb

## 2016-08-11 DIAGNOSIS — Z23 Encounter for immunization: Secondary | ICD-10-CM | POA: Diagnosis not present

## 2016-08-11 DIAGNOSIS — Z0000 Encounter for general adult medical examination without abnormal findings: Secondary | ICD-10-CM | POA: Diagnosis not present

## 2016-08-11 DIAGNOSIS — M4806 Spinal stenosis, lumbar region: Secondary | ICD-10-CM

## 2016-08-11 DIAGNOSIS — M48061 Spinal stenosis, lumbar region without neurogenic claudication: Secondary | ICD-10-CM

## 2016-08-11 LAB — COMPREHENSIVE METABOLIC PANEL WITH GFR
ALT: 39 U/L (ref 0–53)
AST: 19 U/L (ref 0–37)
Albumin: 4.4 g/dL (ref 3.5–5.2)
Alkaline Phosphatase: 54 U/L (ref 39–117)
BUN: 18 mg/dL (ref 6–23)
CO2: 32 meq/L (ref 19–32)
Calcium: 9.7 mg/dL (ref 8.4–10.5)
Chloride: 104 meq/L (ref 96–112)
Creatinine, Ser: 0.99 mg/dL (ref 0.40–1.50)
GFR: 98.96 mL/min
Glucose, Bld: 93 mg/dL (ref 70–99)
Potassium: 5 meq/L (ref 3.5–5.1)
Sodium: 143 meq/L (ref 135–145)
Total Bilirubin: 0.3 mg/dL (ref 0.2–1.2)
Total Protein: 7.5 g/dL (ref 6.0–8.3)

## 2016-08-11 LAB — LIPID PANEL
Cholesterol: 189 mg/dL (ref 0–200)
HDL: 75.6 mg/dL (ref >39.00)
LDL Cholesterol: 100 mg/dL — ABNORMAL HIGH (ref 0–99)
NONHDL: 113.74
Total CHOL/HDL Ratio: 3
Triglycerides: 69 mg/dL (ref 0.0–149.0)
VLDL: 13.8 mg/dL (ref 0.0–40.0)

## 2016-08-11 LAB — CBC
HCT: 41.1 % (ref 39.0–52.0)
HEMOGLOBIN: 13.7 g/dL (ref 13.0–17.0)
MCHC: 33.3 g/dL (ref 30.0–36.0)
MCV: 90.1 fl (ref 78.0–100.0)
PLATELETS: 274 10*3/uL (ref 150.0–400.0)
RBC: 4.57 Mil/uL (ref 4.22–5.81)
RDW: 14.6 % (ref 11.5–15.5)
WBC: 10.4 10*3/uL (ref 4.0–10.5)

## 2016-08-11 LAB — PSA: PSA: 28.9 ng/mL — ABNORMAL HIGH (ref 0.10–4.00)

## 2016-08-11 MED ORDER — HYDROCODONE-ACETAMINOPHEN 7.5-325 MG PO TABS: 1.0000 | ORAL_TABLET | Freq: Four times a day (QID) | ORAL | 0 refills | Status: DC | PRN

## 2016-08-11 NOTE — Addendum Note (Signed)
Addended by: Delice Bison E on: 08/11/2016 01:21 PM   Modules accepted: Orders

## 2016-08-11 NOTE — Progress Notes (Signed)
Subjective:    Patient ID: Juan Richards, male    DOB: 27-Nov-1956, 59 y.o.   MRN: [id number]  Chief Complaint  Patient presents with  . Establish Care    CPE, referral for back pain, fasting    HPI:  Juan Richards is a 60 y.o. male who presents today for an annual wellness visit.   1) Health Maintenance -   Diet - Averages about 2 meals per day consisting of a regular diet; Caffeine intake of 1-2 cups per day.  Exercise - Walking at work   2) Publishing rights manager / Immunizations:  Dental -- Up to date  Vision -- Due for exam   Health Maintenance  Topic Date Due  . Hepatitis C Screening  03-11-1956  . HIV Screening  12/23/1970  . TETANUS/TDAP  12/23/1974  . ZOSTAVAX  12/24/2015  . INFLUENZA VACCINE  09/05/2017 (Originally 07/19/2016)  . COLONOSCOPY  08/22/2019     There is no immunization history on file for this patient.   No Known Allergies   Outpatient Medications Prior to Visit  Medication Sig Dispense Refill  . cyclobenzaprine (FLEXERIL) 10 MG tablet Take 1 tablet (10 mg total) by mouth 3 (three) times daily as needed for muscle spasms. 20 tablet 0  . methylPREDNISolone (MEDROL DOSEPAK) 4 MG TBPK tablet Take as per package insert 21 tablet 0  . naproxen (NAPROSYN) 500 MG tablet Take 1 tablet (500 mg total) by mouth 2 (two) times daily. 14 tablet 0  . cephALEXin (KEFLEX) 500 MG capsule Take 1 capsule (500 mg total) by mouth 2 (two) times daily. 20 capsule 0  . HYDROcodone-acetaminophen (NORCO/VICODIN) 5-325 MG tablet Take 2 tablets by mouth every 4 (four) hours as needed for severe pain. 6 tablet 0  . predniSONE (DELTASONE) 20 MG tablet 3 tabs by mouth today, then 2 tabs by mouth once tomorrow, then 1 tab by mouth once in 2 days. 6 tablet 0   No facility-administered medications prior to visit.      Past Medical History:  Diagnosis Date  . DIVERTICULITIS, COLON, WITH PERFORATION 07/23/2009  . EPIDIDYMAL CYST 08/31/2007  . PLEURISY   . STENOSIS,  LUMBAR SPINE 09/27/2007     History reviewed. No pertinent surgical history.   Family History  Problem Relation Age of Onset  . Diabetes Mother   . Kidney disease Mother   . Cancer Father     ?  . Diabetes Maternal Grandmother      Social History   Social History  . Marital status: Married    Spouse name: N/A  . Number of children: 1  . Years of education: 36   Occupational History  . Inventory Specialist    Social History Main Topics  . Smoking status: Never Smoker  . Smokeless tobacco: Never Used  . Alcohol use 1.2 - 1.8 oz/week    2 - 3 Cans of beer per week  . Drug use: No  . Sexual activity: Yes   Other Topics Concern  . Not on file   Social History Narrative   Fun: Watch TV    Review of Systems  Constitutional: Denies fever, chills, fatigue, or significant weight gain/loss. HENT: Head: Denies headache or neck pain Ears: Denies changes in hearing, ringing in ears, earache, drainage Nose: Denies discharge, stuffiness, itching, nosebleed, sinus pain Throat: Denies sore throat, hoarseness, dry mouth, sores, thrush Eyes: Denies loss/changes in vision, pain, redness, blurry/double vision, flashing lights Cardiovascular: Denies chest pain/discomfort, tightness, palpitations, shortness of  breath with activity, difficulty lying down, swelling, sudden awakening with shortness of breath Respiratory: Denies shortness of breath, cough, sputum production, wheezing Gastrointestinal: Denies dysphasia, heartburn, change in appetite, nausea, change in bowel habits, rectal bleeding, constipation, diarrhea, yellow skin or eyes Genitourinary: Denies frequency, urgency, burning/pain, blood in urine, incontinence, change in urinary strength. Musculoskeletal: Denies muscle/joint pain, stiffness, back pain, redness or swelling of joints, trauma Skin: Denies rashes, lumps, itching, dryness, color changes, or hair/nail changes Neurological: Denies dizziness, fainting, seizures,  weakness, numbness, tingling, tremor Psychiatric - Denies nervousness, stress, depression or memory loss Endocrine: Denies heat or cold intolerance, sweating, frequent urination, excessive thirst, changes in appetite Hematologic: Denies ease of bruising or bleeding     Objective:     BP 118/88 (BP Location: Left Arm, Patient Position: Sitting, Cuff Size: Large)   Pulse 62   Temp 97.9 F (36.6 C) (Oral)   Resp 16   Ht 5\' 10"  (1.778 m)   Wt 230 lb (104.3 kg)   SpO2 98%   BMI 33.00 kg/m  Nursing note and vital signs reviewed.  Physical Exam  Constitutional: He is oriented to person, place, and time. He appears well-developed and well-nourished.  HENT:  Head: Normocephalic.  Right Ear: Hearing, tympanic membrane, external ear and ear canal normal.  Left Ear: Hearing, tympanic membrane, external ear and ear canal normal.  Nose: Nose normal.  Mouth/Throat: Uvula is midline, oropharynx is clear and moist and mucous membranes are normal.  Eyes: Conjunctivae and EOM are normal. Pupils are equal, round, and reactive to light.  Arcus senilis noted  Neck: Neck supple. No JVD present. No tracheal deviation present. No thyromegaly present.  Cardiovascular: Normal rate, regular rhythm, normal heart sounds and intact distal pulses.   Pulmonary/Chest: Effort normal and breath sounds normal.  Abdominal: Soft. Bowel sounds are normal. He exhibits no distension and no mass. There is no tenderness. There is no rebound and no guarding.  Musculoskeletal: Normal range of motion. He exhibits no edema or tenderness.  Lymphadenopathy:    He has no cervical adenopathy.  Neurological: He is alert and oriented to person, place, and time. He has normal reflexes. No cranial nerve deficit. He exhibits normal muscle tone. Coordination normal.  Skin: Skin is warm and dry.  Psychiatric: He has a normal mood and affect. His behavior is normal. Judgment and thought content normal.       Assessment & Plan:    Problem List Items Addressed This Visit      Other   STENOSIS, LUMBAR SPINE - Primary   Relevant Orders   Ambulatory referral to Neurosurgery   Routine general medical examination at a health care facility    1) Anticipatory Guidance: Discussed importance of wearing a seatbelt while driving and not texting while driving; changing batteries in smoke detector at least once annually; wearing suntan lotion when outside; eating a balanced and moderate diet; getting physical activity at least 30 minutes per day.  2) Immunizations / Screenings / Labs:  Tetanus updated today. Declines influenza and Zostavax. All other immunizations are up-to-date per recommendations. Due for a vision exam encouraged to be completed independently. Obtain PSA for prostate cancer screening. Obtain hepatitis C antibody for hepatitis C screening. Due for colonoscopy recommended every 5 years with referral to gastroenterology placed. All other screenings are up-to-date per recommendations. Obtain CBC, CMET, and lipid profile.    Overall well exam with risk factors for cardiovascular disease including obesity.Recommend weight loss of 5-10% of current body weight. Recommend  increasing physical activity to 30 minutes of moderate level activity daily. Encourage nutritional intake that focuses on nutrient dense foods and is moderate, varied, and balanced and is low in saturated fats and processed/sugary foods. Continue other healthy lifestyle behaviors and choices. Follow-up prevention exam in 1 year. Follow-up office visit pending blood work and for chronic conditions as needed.       Relevant Orders   CBC   Comprehensive metabolic panel   Lipid panel   PSA   Ambulatory referral to Gastroenterology   Hepatitis C antibody    Other Visit Diagnoses   None.      I have discontinued Mr. Fitts predniSONE, cephALEXin, and HYDROcodone-acetaminophen. I am also having him start on HYDROcodone-acetaminophen. Additionally, I  am having him maintain his methylPREDNISolone, naproxen, and cyclobenzaprine.   Meds ordered this encounter  Medications  . HYDROcodone-acetaminophen (NORCO) 7.5-325 MG tablet    Sig: Take 1 tablet by mouth every 6 (six) hours as needed for moderate pain.    Dispense:  10 tablet    Refill:  0    Order Specific Question:   Supervising Provider    Answer:   Pricilla Holm A [id number]     Follow-up: Return if symptoms worsen or fail to improve.   Mauricio Po, FNP

## 2016-08-11 NOTE — Assessment & Plan Note (Signed)
1) Anticipatory Guidance: Discussed importance of wearing a seatbelt while driving and not texting while driving; changing batteries in smoke detector at least once annually; wearing suntan lotion when outside; eating a balanced and moderate diet; getting physical activity at least 30 minutes per day.  2) Immunizations / Screenings / Labs:  Tetanus updated today. Declines influenza and Zostavax. All other immunizations are up-to-date per recommendations. Due for a vision exam encouraged to be completed independently. Obtain PSA for prostate cancer screening. Obtain hepatitis C antibody for hepatitis C screening. Due for colonoscopy recommended every 5 years with referral to gastroenterology placed. All other screenings are up-to-date per recommendations. Obtain CBC, CMET, and lipid profile.    Overall well exam with risk factors for cardiovascular disease including obesity.Recommend weight loss of 5-10% of current body weight. Recommend increasing physical activity to 30 minutes of moderate level activity daily. Encourage nutritional intake that focuses on nutrient dense foods and is moderate, varied, and balanced and is low in saturated fats and processed/sugary foods. Continue other healthy lifestyle behaviors and choices. Follow-up prevention exam in 1 year. Follow-up office visit pending blood work and for chronic conditions as needed.

## 2016-08-11 NOTE — Patient Instructions (Addendum)
Thank you for choosing [facility].  SUMMARY AND INSTRUCTIONS:  Medication:  Your prescription(s) have been submitted to your pharmacy or been printed and provided for you. Please take as directed and contact our office if you believe you are having problem(s) with the medication(s) or have any questions.  Labs:  Please stop by the lab on the lower level of the building for your blood work. Your results will be released to [facility] (or called to you) after review, usually within 72 hours after test completion. If any changes need to be made, you will be notified at that same time.  1.) The lab is open from 7:30am to 5:30 pm Monday-Friday 2.) No appointment is necessary 3.) Fasting (if needed) is 6-8 hours after food and drink; black coffee and water are okay   Referrals:  They will call to schedule your colonoscopy.  They will call with your referral to [facility].  Follow up:  If your symptoms worsen or fail to improve, please contact our office for further instruction, or in case of emergency go directly to the emergency room at the closest medical facility.    Health Maintenance, Male A healthy lifestyle and preventative care can promote health and wellness.  Maintain regular health, dental, and eye exams.  Eat a healthy diet. Foods like vegetables, fruits, whole grains, low-fat dairy products, and lean protein foods contain the nutrients you need and are low in calories. Decrease your intake of foods high in solid fats, added sugars, and salt. Get information about a proper diet from your health care provider, if necessary.  Regular physical exercise is one of the most important things you can do for your health. Most adults should get at least 150 minutes of moderate-intensity exercise (any activity that increases your heart rate and causes you to sweat) each week. In addition, most adults need muscle-strengthening exercises on 2 or more days a week.    Maintain a healthy weight. The body mass index (BMI) is a screening tool to identify possible weight problems. It provides an estimate of body fat based on height and weight. Your health care provider can find your BMI and can help you achieve or maintain a healthy weight. For males 20 years and older:  A BMI below 18.5 is considered underweight.  A BMI of 18.5 to 24.9 is normal.  A BMI of 25 to 29.9 is considered overweight.  A BMI of 30 and above is considered obese.  Maintain normal blood lipids and cholesterol by exercising and minimizing your intake of saturated fat. Eat a balanced diet with plenty of fruits and vegetables. Blood tests for lipids and cholesterol should begin at age 64 and be repeated every 5 years. If your lipid or cholesterol levels are high, you are over age 28, or you are at high risk for heart disease, you may need your cholesterol levels checked more frequently.Ongoing high lipid and cholesterol levels should be treated with medicines if diet and exercise are not working.  If you smoke, find out from your health care provider how to quit. If you do not use tobacco, do not start.  Lung cancer screening is recommended for adults aged 56-80 years who are at high risk for developing lung cancer because of a history of smoking. A yearly low-dose CT scan of the lungs is recommended for people who have at least a 30-pack-year history of smoking and are current smokers or have quit within the past 15 years. A pack  year of smoking is smoking an average of 1 pack of cigarettes a day for 1 year (for example, a 30-pack-year history of smoking could mean smoking 1 pack a day for 30 years or 2 packs a day for 15 years). Yearly screening should continue until the smoker has stopped smoking for at least 15 years. Yearly screening should be stopped for people who develop a health problem that would prevent them from having lung cancer treatment.  If you choose to drink alcohol, do not  have more than 2 drinks per day. One drink is considered to be 12 oz (360 mL) of beer, 5 oz (150 mL) of wine, or 1.5 oz (45 mL) of liquor.  Avoid the use of street drugs. Do not share needles with anyone. Ask for help if you need support or instructions about stopping the use of drugs.  High blood pressure causes heart disease and increases the risk of stroke. High blood pressure is more likely to develop in:  People who have blood pressure in the end of the normal range (100-139/85-89 mm Hg).  People who are overweight or obese.  People who are African American.  If you are 47-10 years of age, have your blood pressure checked every 3-5 years. If you are 81 years of age or older, have your blood pressure checked every year. You should have your blood pressure measured twice--once when you are at a hospital or clinic, and once when you are not at a hospital or clinic. Record the average of the two measurements. To check your blood pressure when you are not at a hospital or clinic, you can use:  An automated blood pressure machine at a pharmacy.  A home blood pressure monitor.  If you are 21-59 years old, ask your health care provider if you should take aspirin to prevent heart disease.  Diabetes screening involves taking a blood sample to check your fasting blood sugar level. This should be done once every 3 years after age 72 if you are at a normal weight and without risk factors for diabetes. Testing should be considered at a younger age or be carried out more frequently if you are overweight and have at least 1 risk factor for diabetes.  Colorectal cancer can be detected and often prevented. Most routine colorectal cancer screening begins at the age of 49 and continues through age 44. However, your health care provider may recommend screening at an earlier age if you have risk factors for colon cancer. On a yearly basis, your health care provider may provide home test kits to check for hidden  blood in the stool. A small camera at the end of a tube may be used to directly examine the colon (sigmoidoscopy or colonoscopy) to detect the earliest forms of colorectal cancer. Talk to your health care provider about this at age 79 when routine screening begins. A direct exam of the colon should be repeated every 5-10 years through age 23, unless early forms of precancerous polyps or small growths are found.  People who are at an increased risk for hepatitis B should be screened for this virus. You are considered at high risk for hepatitis B if:  You were born in a country where hepatitis B occurs often. Talk with your health care provider about which countries are considered high risk.  Your parents were born in a high-risk country and you have not received a shot to protect against hepatitis B (hepatitis B vaccine).  You have HIV  or AIDS.  You use needles to inject street drugs.  You live with, or have sex with, someone who has hepatitis B.  You are a man who has sex with other men (MSM).  You get hemodialysis treatment.  You take certain medicines for conditions like cancer, organ transplantation, and autoimmune conditions.  Hepatitis C blood testing is recommended for all people born from [birth year] through 1965 and any individual with known risk factors for hepatitis C.  Healthy men should no longer receive prostate-specific antigen (PSA) blood tests as part of routine cancer screening. Talk to your health care provider about prostate cancer screening.  Testicular cancer screening is not recommended for adolescents or adult males who have no symptoms. Screening includes self-exam, a health care provider exam, and other screening tests. Consult with your health care provider about any symptoms you have or any concerns you have about testicular cancer.  Practice safe sex. Use condoms and avoid high-risk sexual practices to reduce the spread of sexually transmitted infections (STIs).  You  should be screened for STIs, including gonorrhea and chlamydia if:  You are sexually active and are younger than 24 years.  You are older than 24 years, and your health care provider tells you that you are at risk for this type of infection.  Your sexual activity has changed since you were last screened, and you are at an increased risk for chlamydia or gonorrhea. Ask your health care provider if you are at risk.  If you are at risk of being infected with HIV, it is recommended that you take a prescription medicine daily to prevent HIV infection. This is called pre-exposure prophylaxis (PrEP). You are considered at risk if:  You are a man who has sex with other men (MSM).  You are a heterosexual man who is sexually active with multiple partners.  You take drugs by injection.  You are sexually active with a partner who has HIV.  Talk with your health care provider about whether you are at high risk of being infected with HIV. If you choose to begin PrEP, you should first be tested for HIV. You should then be tested every 3 months for as long as you are taking PrEP.  Use sunscreen. Apply sunscreen liberally and repeatedly throughout the day. You should seek shade when your shadow is shorter than you. Protect yourself by wearing long sleeves, pants, a wide-brimmed hat, and sunglasses year round whenever you are outdoors.  Tell your health care provider of new moles or changes in moles, especially if there is a change in shape or color. Also, tell your health care provider if a mole is larger than the size of a pencil eraser.  A one-time screening for abdominal aortic aneurysm (AAA) and surgical repair of large AAAs by ultrasound is recommended for men aged 88-75 years who are current or former smokers.  Stay current with your vaccines (immunizations).   This information is not intended to replace advice given to you by your health care provider. Make sure you discuss any questions you have  with your health care provider.   Document Released: 06/02/2008 Document Revised: 12/26/2014 Document Reviewed: 05/02/2011 Elsevier Interactive Patient Education [employer].

## 2016-08-12 ENCOUNTER — Telehealth: Payer: Self-pay

## 2016-08-12 ENCOUNTER — Other Ambulatory Visit: Payer: Self-pay | Admitting: Family

## 2016-08-12 DIAGNOSIS — R972 Elevated prostate specific antigen [PSA]: Secondary | ICD-10-CM

## 2016-08-12 LAB — HEPATITIS C ANTIBODY: HCV Ab: NEGATIVE

## 2016-08-12 NOTE — Progress Notes (Signed)
Noted to have an elevated PSA during recent physical exam. Referral to urology placed.

## 2016-08-12 NOTE — Telephone Encounter (Signed)
Patient called you back about his lab work. Due to what the notes said I did not feel it was my place to give him his information. If you would please follow up with him. Thank you.

## 2016-08-15 ENCOUNTER — Encounter: Payer: Self-pay | Admitting: Family

## 2016-08-15 NOTE — Telephone Encounter (Signed)
Pt aware of lab results 

## 2016-08-16 ENCOUNTER — Other Ambulatory Visit: Payer: Self-pay | Admitting: Family

## 2016-08-16 ENCOUNTER — Telehealth: Payer: Self-pay | Admitting: Emergency Medicine

## 2016-08-16 MED ORDER — OXYCODONE-ACETAMINOPHEN 5-325 MG PO TABS: 1.0000 | ORAL_TABLET | Freq: Three times a day (TID) | ORAL | 0 refills | Status: DC | PRN

## 2016-08-16 NOTE — Telephone Encounter (Signed)
[facility] would like a copy of patients labs faxed to [phone]. They needs these results to determine how soon the patient needs to be seen. Please advise thanks.

## 2016-08-16 NOTE — Telephone Encounter (Signed)
Labs have been faxed.

## 2016-08-17 ENCOUNTER — Encounter (HOSPITAL_COMMUNITY): Payer: Self-pay | Admitting: *Deleted

## 2016-08-17 ENCOUNTER — Encounter: Payer: Self-pay | Admitting: Gastroenterology

## 2016-08-17 ENCOUNTER — Emergency Department (HOSPITAL_COMMUNITY)
Admission: EM | Admit: 2016-08-17 | Discharge: 2016-08-17 | Disposition: A | Discharge status: Home / Self Care | Payer: Commercial Managed Care - HMO | Attending: Emergency Medicine | Admitting: Emergency Medicine

## 2016-08-17 ENCOUNTER — Emergency Department (HOSPITAL_COMMUNITY): Payer: Commercial Managed Care - HMO

## 2016-08-17 DIAGNOSIS — M48061 Spinal stenosis, lumbar region without neurogenic claudication: Secondary | ICD-10-CM

## 2016-08-17 DIAGNOSIS — M545 Low back pain: Secondary | ICD-10-CM | POA: Diagnosis present

## 2016-08-17 DIAGNOSIS — M4806 Spinal stenosis, lumbar region: Secondary | ICD-10-CM | POA: Diagnosis not present

## 2016-08-17 LAB — URINALYSIS, ROUTINE W REFLEX MICROSCOPIC
BILIRUBIN URINE: NEGATIVE
Glucose, UA: NEGATIVE mg/dL
HGB URINE DIPSTICK: NEGATIVE
Ketones, ur: NEGATIVE mg/dL
Leukocytes, UA: NEGATIVE
Nitrite: NEGATIVE
PROTEIN: NEGATIVE mg/dL
Specific Gravity, Urine: 1.015 (ref 1.005–1.030)
pH: 6.5 (ref 5.0–8.0)

## 2016-08-17 MED ORDER — KETOROLAC TROMETHAMINE 30 MG/ML IJ SOLN
15.0000 mg | Freq: Once | INTRAMUSCULAR | Status: AC
  Administered 2016-08-17: 15 mg via INTRAMUSCULAR
  Filled 2016-08-17: qty 1

## 2016-08-17 MED ORDER — HYDROMORPHONE HCL 2 MG PO TABS: 1.0000 mg | ORAL_TABLET | Freq: Four times a day (QID) | ORAL | 0 refills | Status: DC | PRN

## 2016-08-17 MED ORDER — HYDROMORPHONE HCL 1 MG/ML IJ SOLN
1.0000 mg | Freq: Once | INTRAMUSCULAR | Status: AC
  Administered 2016-08-17: 1 mg via INTRAMUSCULAR
  Filled 2016-08-17: qty 1

## 2016-08-17 NOTE — ED Notes (Signed)
Pt provided with d/c instructions at this time. Pt verbalizes understanding of d/c instructions as well as follow up procedure. Pt provided with RX for dilaudid.  Pt verbalizes understanding of  RX directions. Pt in no apparent distress at this time.  Pt assisted out of the ED via [facility] at pt request.

## 2016-08-17 NOTE — ED Provider Notes (Signed)
[facility] DEPT Provider Note   CSN: [id number] Arrival date & time: 08/17/16  1533     History   Chief Complaint Chief Complaint  Patient presents with  . Back Pain    HPI Juan Richards is a 60 y.o. male.  HPI   Patient presents with concern of ongoing pain, discomfort in the lower back, and down the posterior of both legs. Patient alleges a history of intermittent back problems, this episode began about 3 weeks ago, has been present, worsening since that time. Exhibits some of the discomfort to using a low riding a vehicle, requiring bending to get into, and his job, which requires substantial physical activity. Over the past few weeks the patient has had persistent discomfort, not improved in spite of narcotics, steroids, and said. Patient was seen here last week, and his primary care office today. He was referred from there to urology given the patient's acknowledgment of difficulty with initiating urination. Patient was told he has an enlarged prostate in addition to his ongoing back pain. Patient was also referred to neurosurgery, has not yet seen a neurosurgeon.  Past Medical History:  Diagnosis Date  . DIVERTICULITIS, COLON, WITH PERFORATION 07/23/2009  . EPIDIDYMAL CYST 08/31/2007  . PLEURISY   . 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 Routine general medical examination at a health care facility 08/11/2016  . LEG PAIN, RIGHT 11/30/2010  . DIVERTICULITIS, COLON, WITH PERFORATION 07/23/2009  . GROSS HEMATURIA 07/23/2009  . CONSTIPATION 09/27/2007  . 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 PLEURISY 08/31/2007  . EPIDIDYMAL CYST 08/31/2007    History reviewed. No pertinent surgical history.     Home Medications    Prior to Admission medications   Medication Sig Start Date End Date Taking? Authorizing Provider  cyclobenzaprine (FLEXERIL) 10 MG tablet Take 1 tablet (10 mg total) by mouth 3 (three) times  daily as needed for muscle spasms. 08/08/16  Yes Duffy Bruce, MD  ibuprofen (ADVIL,MOTRIN) 800 MG tablet Take 800 mg by mouth every 8 (eight) hours as needed for mild pain or moderate pain.   Yes Historical Provider, MD  oxyCODONE-acetaminophen (ROXICET) 5-325 MG tablet Take 1 tablet by mouth every 8 (eight) hours as needed for severe pain. 08/16/16  Yes Golden Circle, FNP  HYDROcodone-acetaminophen (NORCO) 7.5-325 MG tablet Take 1 tablet by mouth every 6 (six) hours as needed for moderate pain. Patient not taking: Reported on 08/17/2016 08/11/16   Golden Circle, FNP  methylPREDNISolone (MEDROL DOSEPAK) 4 MG TBPK tablet Take as per package insert Patient not taking: Reported on 08/17/2016 08/08/16   Duffy Bruce, MD    Family History Family History  Problem Relation Age of Onset  . Diabetes Mother   . Kidney disease Mother   . Cancer Father     ?  . Diabetes Maternal Grandmother     Social History Social History  Substance Use Topics  . Smoking status: Never Smoker  . Smokeless tobacco: Never Used  . Alcohol use 1.2 - 1.8 oz/week    2 - 3 Cans of beer per week     Allergies   Review of patient's allergies indicates no known allergies.   Review of Systems Review of Systems  Constitutional:       Per HPI, otherwise negative  HENT:       Per HPI, otherwise negative  Respiratory:       Per HPI, otherwise negative  Cardiovascular:  Per HPI, otherwise negative  Gastrointestinal: Negative for vomiting.  Endocrine:       Negative aside from HPI  Genitourinary:       Difficulty initiating urine  Musculoskeletal:       Per HPI, otherwise negative  Skin: Negative.   Neurological: Positive for numbness. Negative for syncope.     Physical Exam Updated Vital Signs BP 154/91   Pulse 77   Temp 97.9 F (36.6 C)   Resp 18   Ht 5\' 10"  (1.778 m)   Wt 230 lb 1 oz (104.4 kg)   SpO2 99%   BMI 33.01 kg/m   Physical Exam  Constitutional: He is oriented to person,  place, and time. He appears well-developed and well-nourished. No distress.  HENT:  Head: Normocephalic and atraumatic.  Eyes: Conjunctivae are normal.  Neck: No tracheal deviation present.  Cardiovascular: Normal rate, regular rhythm and normal heart sounds.  Exam reveals no friction rub.   No murmur heard. Pulmonary/Chest: Effort normal and breath sounds normal. No stridor. No respiratory distress.  Abdominal: There is no tenderness.  Musculoskeletal: He exhibits no edema.  Patient describes pain throughout the right lateral gluteus maximus area, radiating down the right lateral thigh, but no deformity is appreciable.  Neurological: He is alert and oriented to person, place, and time. He has normal strength. He exhibits normal muscle tone.  Skin: Skin is warm. Capillary refill takes less than 2 seconds.  Psychiatric: He has a normal mood and affect.  Nursing note and vitals reviewed.   EMR review notable for pmd visit today, ED visit last week and MRI last in 2012 w spinal stenosis.  ED Treatments / Results  Labs (all labs ordered are listed, but only abnormal results are displayed) Labs Reviewed  URINALYSIS, ROUTINE W REFLEX MICROSCOPIC (NOT AT [facility])    Radiology Mr Lumbar Spine Wo Contrast  Result Date: 08/17/2016 CLINICAL DATA:  Severe back pain. Worsening BILATERAL lower extremity paresthesias worse on the RIGHT. Possible bowel movement changes, unspecified. EXAM: MRI LUMBAR SPINE WITHOUT CONTRAST TECHNIQUE: Multiplanar, multisequence MR imaging of the lumbar spine was performed. No intravenous contrast was administered. COMPARISON:  Plain films 08/08/2016.  MRI lumbar spine 11/11/2011. FINDINGS: Segmentation:  Normal. Alignment: Trace retrolisthesis of S1 is degenerative in nature. No significant subluxation. Vertebrae: Endplate reactive changes related to multilevel disc space narrowing. No worrisome osseous lesion. Conus medullaris: Extends to the L1 level and appears normal.  Paraspinal and other soft tissues: Unremarkable. Disc levels: L1-L2:  Normal disc space.  Mild facet arthropathy.  No impingement. L2-L3: Severe disc space narrowing. Posterior element hypertrophy. Annular bulge. Slight osseous ridging. No definite impingement. L3-L4: Severe disc space narrowing. Central disc extrusion. Posterior element hypertrophy is superimposed. Critical spinal stenosis, with near complete obliteration of the subarachnoid space ; see image 28 series 6. Subarticular zone and foraminal zone narrowing affect the L3 and L4 nerve roots. L4-L5: Severe disc space narrowing. Shallow central protrusion. Posterior element hypertrophy. Severe stenosis. BILATERAL subarticular zone and foraminal zone narrowing could affect the L4 and L5 nerve roots. L5-S1: Severe disc space narrowing. Trace retrolisthesis. Annular bulging with osseous spurring. Mild facet arthropathy. BILATERAL foraminal recurrent narrowing greater than subarticular zone narrowing. Either L5 nerve root could be affected. Compared with prior MRI in 2012, there is progression of disease at all levels. 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 In addition to near complete obliteration of the subarachnoid space, there is significant compression  of the L3 and L4 nerve roots in the foraminal and subarticular zones. Progression of disease since prior MR 2012. Electronically Signed   By: Staci Righter M.D.   On: 08/17/2016 19:32    I discussed the patient's radiology studies with our radiologist, subsequent with neurosurgery. We discussed appropriate symptom control, need for surgery.    Procedures Procedures (including critical care time)  Medications Ordered in ED Medications  ketorolac (TORADOL) 30 MG/ML injection 15 mg (15 mg Intramuscular Given 08/17/16 1703)  HYDROmorphone (DILAUDID) injection 1 mg (1 mg Intramuscular Given 08/17/16 1805)      Initial Impression / Assessment and Plan / ED Course  I have reviewed the triage vital signs and the nursing notes.  Pertinent labs & imaging results that were available during my care of the patient were reviewed by me and considered in my medical decision making (see chart for details).  Clinical Course    9:05 PM I had a very lengthy conversation with the patient about the MRI results, need follow-up with neurosurgery. Patient has minimal pain, after narcotic analgesia. I also discussed all findings the patient's wife. We discussed the importance of follow-up.  Final Clinical Impressions(s) / ED Diagnoses  Patient presents with worsening low back pain, bilateral leg pain, and new urinary changes. Here the patient is awake and alert, but clearly in discomfort, with difficulty walking secondary to pain. Patient is able to produce urine, though with hesitation. No evidence for complete retention. MRI demonstrates critical spinal stenosis. I discussed this with our neurosurgical colleagues, and the patient was discharged to follow-up tomorrow with neurosurgery after appropriate analgesia was obtained.   Carmin Muskrat, MD 08/17/16 2106

## 2016-08-17 NOTE — ED Triage Notes (Signed)
The pt is c/o back pain lower with bi-lateral pain and cramping for 3 weeks  He has had for 3 weeks he was sent here by another doctor today

## 2016-09-12 ENCOUNTER — Encounter: Payer: Commercial Managed Care - HMO | Admitting: Gastroenterology

## 2016-12-27 ENCOUNTER — Encounter: Payer: Self-pay | Admitting: Nurse Practitioner

## 2016-12-27 ENCOUNTER — Ambulatory Visit (INDEPENDENT_AMBULATORY_CARE_PROVIDER_SITE_OTHER): Payer: Commercial Managed Care - HMO | Admitting: Nurse Practitioner

## 2016-12-27 VITALS — BP 128/72 | HR 90 | Temp 98.3°F | Resp 18 | Ht 70.0 in | Wt 237.0 lb

## 2016-12-27 DIAGNOSIS — J0140 Acute pansinusitis, unspecified: Secondary | ICD-10-CM

## 2016-12-27 MED ORDER — GUAIFENESIN ER 600 MG PO TB12: 600.0000 mg | ORAL_TABLET | Freq: Two times a day (BID) | ORAL | 0 refills | Status: DC | PRN

## 2016-12-27 MED ORDER — FLUTICASONE PROPIONATE 50 MCG/ACT NA SUSP: 2.0000 | Freq: Every day | NASAL | 0 refills | Status: DC

## 2016-12-27 MED ORDER — AMOXICILLIN-POT CLAVULANATE 875-125 MG PO TABS: 1.0000 | ORAL_TABLET | Freq: Two times a day (BID) | ORAL | 0 refills | Status: DC

## 2016-12-27 MED ORDER — OXYMETAZOLINE HCL 0.05 % NA SOLN: 1.0000 | Freq: Two times a day (BID) | NASAL | 0 refills | Status: DC

## 2016-12-27 NOTE — Progress Notes (Signed)
Pre visit review using our clinic review tool, if applicable. No additional management support is needed unless otherwise documented below in the visit note. 

## 2016-12-27 NOTE — Progress Notes (Signed)
Subjective:  Patient ID: Juan Richards, male    DOB: 09/15/1956  Age: 61 y.o. MRN: [id number]  CC: Acute Visit (body ache, fever, cough)   URI   This is a new problem. The current episode started in the past 7 days. The problem has been gradually worsening. There has been no fever. Associated symptoms include congestion, coughing, headaches, a plugged ear sensation, rhinorrhea and sinus pain. Pertinent negatives include no abdominal pain, chest pain, diarrhea, dysuria, ear pain, joint pain, joint swelling, nausea, neck pain, rash, sneezing, sore throat, swollen glands, vomiting or wheezing. He has tried decongestant, NSAIDs and acetaminophen for the symptoms. The treatment provided no relief.    Outpatient Medications Prior to Visit  Medication Sig Dispense Refill  . cyclobenzaprine (FLEXERIL) 10 MG tablet Take 1 tablet (10 mg total) by mouth 3 (three) times daily as needed for muscle spasms. (Patient not taking: Reported on 12/27/2016) 20 tablet 0  . HYDROmorphone (DILAUDID) 2 MG tablet Take 0.5 tablets (1 mg total) by mouth every 6 (six) hours as needed for severe pain. (Patient not taking: Reported on 12/27/2016) 20 tablet 0  . ibuprofen (ADVIL,MOTRIN) 800 MG tablet Take 800 mg by mouth every 8 (eight) hours as needed for mild pain or moderate pain.    . methylPREDNISolone (MEDROL DOSEPAK) 4 MG TBPK tablet Take as per package insert (Patient not taking: Reported on 12/27/2016) 21 tablet 0  . oxyCODONE-acetaminophen (ROXICET) 5-325 MG tablet Take 1 tablet by mouth every 8 (eight) hours as needed for severe pain. (Patient not taking: Reported on 12/27/2016) 30 tablet 0   No facility-administered medications prior to visit.     ROS See HPI  Objective:  BP 128/72   Pulse 90   Temp 98.3 F (36.8 C) (Oral)   Resp 18   Ht 5\' 10"  (1.778 m)   Wt 237 lb (107.5 kg)   SpO2 97%   BMI 34.01 kg/m   BP Readings from Last 3 Encounters:  12/27/16 128/72  08/17/16 154/91  08/11/16 118/88    Wt  Readings from Last 3 Encounters:  12/27/16 237 lb (107.5 kg)  08/17/16 230 lb 1 oz (104.4 kg)  08/11/16 230 lb (104.3 kg)    Physical Exam  Constitutional: He is oriented to person, place, and time. No distress.  HENT:  Right Ear: Tympanic membrane, external ear and ear canal normal.  Left Ear: Tympanic membrane and ear canal normal.  Nose: Mucosal edema and rhinorrhea present. Right sinus exhibits maxillary sinus tenderness and frontal sinus tenderness. Left sinus exhibits maxillary sinus tenderness and frontal sinus tenderness.  Mouth/Throat: Uvula is midline. Posterior oropharyngeal erythema present. No oropharyngeal exudate.  Eyes: No scleral icterus.  Neck: Normal range of motion. Neck supple.  Cardiovascular: Normal rate and regular rhythm.   Pulmonary/Chest: Effort normal and breath sounds normal.  Lymphadenopathy:    He has no cervical adenopathy.  Neurological: He is alert and oriented to person, place, and time.  Vitals reviewed.   Lab Results  Component Value Date   WBC 10.4 08/11/2016   HGB 13.7 08/11/2016   HCT 41.1 08/11/2016   PLT 274.0 08/11/2016   GLUCOSE 93 08/11/2016   CHOL 189 08/11/2016   TRIG 69.0 08/11/2016   HDL 75.60 08/11/2016   LDLCALC 100 (H) 08/11/2016   ALT 39 08/11/2016   AST 19 08/11/2016   NA 143 08/11/2016   K 5.0 08/11/2016   CL 104 08/11/2016   CREATININE 0.99 08/11/2016   BUN 18 08/11/2016  CO2 32 08/11/2016   PSA 28.90 (H) 08/11/2016   INR 1.1 07/23/2009    Mr Lumbar Spine Wo Contrast  Result Date: 08/17/2016 CLINICAL DATA:  Severe back pain. Worsening BILATERAL lower extremity paresthesias worse on the RIGHT. Possible bowel movement changes, unspecified. EXAM: MRI LUMBAR SPINE WITHOUT CONTRAST TECHNIQUE: Multiplanar, multisequence MR imaging of the lumbar spine was performed. No intravenous contrast was administered. COMPARISON:  Plain films 08/08/2016.  MRI lumbar spine 11/11/2011. FINDINGS: Segmentation:  Normal. Alignment:  Trace retrolisthesis of S1 is degenerative in nature. No significant subluxation. Vertebrae: Endplate reactive changes related to multilevel disc space narrowing. No worrisome osseous lesion. Conus medullaris: Extends to the L1 level and appears normal. Paraspinal and other soft tissues: Unremarkable. Disc levels: L1-L2:  Normal disc space.  Mild facet arthropathy.  No impingement. L2-L3: Severe disc space narrowing. Posterior element hypertrophy. Annular bulge. Slight osseous ridging. No definite impingement. L3-L4: Severe disc space narrowing. Central disc extrusion. Posterior element hypertrophy is superimposed. Critical spinal stenosis, with near complete obliteration of the subarachnoid space ; see image 28 series 6. Subarticular zone and foraminal zone narrowing affect the L3 and L4 nerve roots. L4-L5: Severe disc space narrowing. Shallow central protrusion. Posterior element hypertrophy. Severe stenosis. BILATERAL subarticular zone and foraminal zone narrowing could affect the L4 and L5 nerve roots. L5-S1: Severe disc space narrowing. Trace retrolisthesis. Annular bulging with osseous spurring. Mild facet arthropathy. BILATERAL foraminal recurrent narrowing greater than subarticular zone narrowing. Either L5 nerve root could be affected. Compared with prior MRI in 2012, there is progression of disease at all levels. 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 In addition to near complete obliteration of the subarachnoid space, there is significant compression of the L3 and L4 nerve roots in the foraminal and subarticular zones. Progression of disease since prior MR 2012. Electronically Signed   By: Staci Righter M.D.   On: 08/17/2016 19:32    Assessment & Plan:   Brexten was seen today for acute visit.  Diagnoses and all orders for this visit:  Acute non-recurrent pansinusitis -     fluticasone (FLONASE) 50  MCG/ACT nasal spray; Place 2 sprays into both nostrils daily. -     oxymetazoline (AFRIN NASAL SPRAY) 0.05 % nasal spray; Place 1 spray into both nostrils 2 (two) times daily. Use only for 3days, then stop -     guaiFENesin (MUCINEX) 600 MG 12 hr tablet; Take 1 tablet (600 mg total) by mouth 2 (two) times daily as needed for cough or to loosen phlegm. -     amoxicillin-clavulanate (AUGMENTIN) 875-125 MG tablet; Take 1 tablet by mouth 2 (two) times daily.   I am having Mr. Amarante start on fluticasone, oxymetazoline, guaiFENesin, and amoxicillin-clavulanate. I am also having him maintain his methylPREDNISolone, cyclobenzaprine, oxyCODONE-acetaminophen, ibuprofen, and HYDROmorphone.  Meds ordered this encounter  Medications  . fluticasone (FLONASE) 50 MCG/ACT nasal spray    Sig: Place 2 sprays into both nostrils daily.    Dispense:  16 g    Refill:  0    Order Specific Question:   Supervising Provider    Answer:   Cassandria Anger [[id number]]  . oxymetazoline (AFRIN NASAL SPRAY) 0.05 % nasal spray    Sig: Place 1 spray into both nostrils 2 (two) times daily. Use only for 3days, then stop    Dispense:  30 mL    Refill:  0    Order Specific Question:   Supervising Provider  Answer:   Cassandria Anger [[id number]]  . guaiFENesin (MUCINEX) 600 MG 12 hr tablet    Sig: Take 1 tablet (600 mg total) by mouth 2 (two) times daily as needed for cough or to loosen phlegm.    Dispense:  14 tablet    Refill:  0    Order Specific Question:   Supervising Provider    Answer:   Cassandria Anger [[id number]]  . amoxicillin-clavulanate (AUGMENTIN) 875-125 MG tablet    Sig: Take 1 tablet by mouth 2 (two) times daily.    Dispense:  14 tablet    Refill:  0    Order Specific Question:   Supervising Provider    Answer:   Cassandria Anger [[id number]]    Follow-up: Return if symptoms worsen or fail to improve.  Wilfred Lacy, NP

## 2016-12-27 NOTE — Patient Instructions (Addendum)
URI Instructions: Flonase and Afrin use: apply 1spray of afrin in each nare, wait 19mins, then apply 2sprays of flonase in each nare. Use both nasal spray consecutively x 3days, then flonase only for at least 14days.  Encourage adequate oral hydration.  Use over-the-counter  "cold" medicines  such as "Tylenol cold" , "Advil cold",  "Mucinex" or" Mucinex D"  for cough and congestion.  Avoid decongestants if you have high blood pressure. Use" Delsym" or" Robitussin" cough syrup varietis for cough.  You can use plain "Tylenol" or "Advi"l for fever, chills and achyness.  Patient declined benzonatate prescription.

## 2017-06-27 ENCOUNTER — Ambulatory Visit (INDEPENDENT_AMBULATORY_CARE_PROVIDER_SITE_OTHER): Payer: 59

## 2017-06-27 ENCOUNTER — Encounter: Payer: Self-pay | Admitting: Physician Assistant

## 2017-06-27 ENCOUNTER — Ambulatory Visit (INDEPENDENT_AMBULATORY_CARE_PROVIDER_SITE_OTHER): Payer: 59 | Admitting: Physician Assistant

## 2017-06-27 VITALS — BP 149/90 | HR 79 | Temp 98.2°F | Resp 18 | Ht 69.33 in | Wt 234.2 lb

## 2017-06-27 DIAGNOSIS — M7989 Other specified soft tissue disorders: Secondary | ICD-10-CM | POA: Diagnosis not present

## 2017-06-27 DIAGNOSIS — M79644 Pain in right finger(s): Secondary | ICD-10-CM

## 2017-06-27 DIAGNOSIS — I891 Lymphangitis: Secondary | ICD-10-CM | POA: Diagnosis not present

## 2017-06-27 DIAGNOSIS — L03011 Cellulitis of right finger: Secondary | ICD-10-CM | POA: Diagnosis not present

## 2017-06-27 LAB — POCT CBC
GRANULOCYTE PERCENT: 63.8 % (ref 37–80)
HCT, POC: 38.8 % — AB (ref 43.5–53.7)
Hemoglobin: 12.9 g/dL — AB (ref 14.1–18.1)
Lymph, poc: 2 (ref 0.6–3.4)
MCH: 29.5 pg (ref 27–31.2)
MCHC: 33.3 g/dL (ref 31.8–35.4)
MCV: 88.7 fL (ref 80–97)
MID (cbc): 0.3 (ref 0–0.9)
MPV: 7.9 fL (ref 0–99.8)
PLATELET COUNT, POC: 249 10*3/uL (ref 142–424)
POC GRANULOCYTE: 4 (ref 2–6.9)
POC LYMPH %: 31.8 % (ref 10–50)
POC MID %: 4.4 %M (ref 0–12)
RBC: 4.37 M/uL — AB (ref 4.69–6.13)
RDW, POC: 13.8 %
WBC: 6.3 10*3/uL (ref 4.6–10.2)

## 2017-06-27 MED ORDER — CEFTRIAXONE SODIUM 1 G IJ SOLR
1.0000 g | Freq: Once | INTRAMUSCULAR | Status: AC
  Administered 2017-06-27: 1 g via INTRAMUSCULAR

## 2017-06-27 MED ORDER — CEPHALEXIN 500 MG PO CAPS: 500.0000 mg | ORAL_CAPSULE | Freq: Four times a day (QID) | ORAL | 0 refills | Status: AC

## 2017-06-27 NOTE — Patient Instructions (Addendum)
Please take the antibiotic as prescribed.   You will return sooner if your symptoms worsen.     Lymphangitis, Adult Lymphangitis is inflammation of one or more lymph vessels. This condition is usually caused by a bacterial infection. The lymphatic system is part of the body's defense system (immune system). It is a network of vessels, glands, and organs that carry fluid (lymph) and other substances around the body. Lymph vessels drain into glands called lymph nodes. These nodes filter bacteria and waste products from lymph. Lymphangitis usually develops when bacteria spreads to the lymphatic system from an infected wound or scrape on the skin of the arms or legs. It can also result from a skin infection (cellulitis) that spreads to the lymph nodes. Lymphangitis can spread quickly through your lymph system and into your blood (bacteremia). What are the causes? This condition is usually caused by bacteria that get into the lymph vessels. Most often, this is streptococcus bacteria. In some cases, staphylococcus bacteria may be the cause. Many other types of bacteria can also cause lymphangitis, but that is rare. What increases the risk? The following factors may make you more likely to develop this condition:  Being male. Men are more likely to get lymphangitis caused by cellulitis.  Having a decreased ability to fight infection or a weakened immune system.  Having diabetes.  Taking drugs that suppress the immune system.  Having chickenpox.  Being weak from another illness.  What are the signs or symptoms? The most common symptom of lymphangitis is a wound or skin infection that develops red streaks in the skin. These are the infected lymph vessels. The red streaks will extend toward the lymph nodes that drain the vessels. Other symptoms may include:  Warmth and tenderness over the streaks.  Throbbing pain.  Swollen and tender lymph nodes. ? For arm infections, these will be under the  arm. ? For leg infections, these will be in the groin area.  Fever.  Chills.  Headache.  Appetite loss.  Muscle aches.  Fast pulse.  How is this diagnosed? This condition may be diagnosed based on your symptoms and a physical exam. You may also have tests, such as:  Blood tests to check for an increase in white blood cells.  Blood cultures to look for bacteremia.  Culture and sensitivity testing. This is a test to find out what type of bacteria will grow from a sample of pus swabbed from the wound or skin infection. The results help determine which antibiotic medicines will kill the bacteria.  How is this treated? Treatment for this condition may include:  Antibiotics. ? You may be started on an antibiotic that is known to kill both streptococcus and staphylococcus bacteria. ? Your antibiotics may need to be switched if tests show that your condition is caused by another type of bacteria. ? If your infection is very bad or has spread to another area of your body, you may need to get antibiotics through an IV at the hospital.  Pain medicine.  Incision and drainage. This is a procedure that may be done at the hospital if pus needs to be drained from your wound.  Follow these instructions at home:  Take over-the-counter and prescription medicines only as told by your health care provider.  Take your antibiotic medicine as told by your health care provider. Do not stop taking the antibiotic even if you start to feel better.  Rest at home until your health care provider says that you can return  to your normal activities.  Follow instructions from your health care provider about how to take care of your wound.  Raise (elevate) the affected area above the level of your heart while you are sitting or lying down.  Keep all follow-up visits as told by your health care provider. This is important. Contact a health care provider if:  You have chills or a fever.  Your symptoms  do not go away with treatment.  Your symptoms come back after treatment. This information is not intended to replace advice given to you by your health care provider. Make sure you discuss any questions you have with your health care provider. Document Released: 01/01/2016 Document Revised: 05/12/2016 Document Reviewed: 12/24/2014 Elsevier Interactive Patient Education  2018 [employer].     IF you received an x-ray today, you will receive an invoice from [facility]. Please contact [facility] at [phone] with questions or concerns regarding your invoice.   IF you received labwork today, you will receive an invoice from [facility]. Please contact [facility] at [phone] with questions or concerns regarding your invoice.   Our billing staff will not be able to assist you with questions regarding bills from these companies.  You will be contacted with the lab results as soon as they are available. The fastest way to get your results is to activate your My Chart account. Instructions are located on the last page of this paperwork. If you have not heard from [place] regarding the results in 2 weeks, please contact this office.

## 2017-06-29 ENCOUNTER — Ambulatory Visit (INDEPENDENT_AMBULATORY_CARE_PROVIDER_SITE_OTHER): Payer: 59 | Admitting: Physician Assistant

## 2017-06-29 ENCOUNTER — Encounter: Payer: Self-pay | Admitting: Physician Assistant

## 2017-06-29 VITALS — BP 128/87 | HR 80 | Resp 16 | Ht 69.33 in | Wt 229.8 lb

## 2017-06-29 DIAGNOSIS — Z0189 Encounter for other specified special examinations: Secondary | ICD-10-CM

## 2017-06-29 DIAGNOSIS — L03011 Cellulitis of right finger: Secondary | ICD-10-CM

## 2017-06-29 DIAGNOSIS — I891 Lymphangitis: Secondary | ICD-10-CM

## 2017-06-29 DIAGNOSIS — Z7689 Persons encountering health services in other specified circumstances: Secondary | ICD-10-CM

## 2017-06-29 NOTE — Patient Instructions (Addendum)
  Please continue to take your antibiotic.  You are to soak the finger three times per day in warm soapy water.  Rinse and dry, then cover.  You can leave it open at night.      IF you received an x-ray today, you will receive an invoice from [facility]. Please contact [facility] at [phone] with questions or concerns regarding your invoice.   IF you received labwork today, you will receive an invoice from [facility]. Please contact [facility] at [phone] with questions or concerns regarding your invoice.   Our billing staff will not be able to assist you with questions regarding bills from these companies.  You will be contacted with the lab results as soon as they are available. The fastest way to get your results is to activate your My Chart account. Instructions are located on the last page of this paperwork. If you have not heard from [place] regarding the results in 2 weeks, please contact this office.

## 2017-06-30 NOTE — Progress Notes (Signed)
PRIMARY CARE AT [place], [place] [id number] [phone]  Date:  06/27/2017   Name:  Juan Richards   DOB:  [birth year]   MRN:  [id number]  PCP:  [place], FNP    History of Present Illness:  Juan Richards is a 61 y.o. male patient who presents to PCP with  Chief Complaint  Patient presents with  . Insect Bite    X 1 day and then today     Patient noticed that yesterday he had pain of his thumb that progressively worsened up the arm.  He has noticed redness and warmth to the forearm.  He has noted no fever, fatigue, or nausea.  No pain or swelling at the armpit that he has noticed.  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  He also notes that he bites his nail a lot his thumb where the swelling and pain is.    Patient Active Problem List   Diagnosis Date Noted  . Routine general medical examination at a health care facility 08/11/2016  . LEG PAIN, RIGHT 11/30/2010  . DIVERTICULITIS, COLON, WITH PERFORATION 07/23/2009  . GROSS HEMATURIA 07/23/2009  . CONSTIPATION 09/27/2007  . 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 PLEURISY 08/31/2007  . EPIDIDYMAL CYST 08/31/2007    Past Medical History:  Diagnosis Date  . DIVERTICULITIS, COLON, WITH PERFORATION 07/23/2009  . EPIDIDYMAL CYST 08/31/2007  . PLEURISY   . STENOSIS, LUMBAR SPINE 09/27/2007    History reviewed. No pertinent surgical history.  Social History  Substance Use Topics  . Smoking status: Never Smoker  . Smokeless tobacco: Never Used  . Alcohol use 1.2 - 1.8 oz/week    2 - 3 Cans of beer per week    Family History  Problem Relation Age of Onset  . Diabetes Mother   . Kidney disease Mother   . Cancer Father        ?  . Diabetes Maternal Grandmother     No Known Allergies  Medication list has been reviewed and updated.  Current Outpatient Prescriptions on File Prior to Visit  Medication Sig Dispense Refill  .  amoxicillin-clavulanate (AUGMENTIN) 875-125 MG tablet Take 1 tablet by mouth 2 (two) times daily. (Patient not taking: Reported on 06/29/2017) 14 tablet 0  . cyclobenzaprine (FLEXERIL) 10 MG tablet Take 1 tablet (10 mg total) by mouth 3 (three) times daily as needed for muscle spasms. (Patient not taking: Reported on 06/29/2017) 20 tablet 0  . fluticasone (FLONASE) 50 MCG/ACT nasal spray Place 2 sprays into both nostrils daily. (Patient not taking: Reported on 06/29/2017) 16 g 0  . guaiFENesin (MUCINEX) 600 MG 12 hr tablet Take 1 tablet (600 mg total) by mouth 2 (two) times daily as needed for cough or to loosen phlegm. (Patient not taking: Reported on 06/29/2017) 14 tablet 0  . HYDROmorphone (DILAUDID) 2 MG tablet Take 0.5 tablets (1 mg total) by mouth every 6 (six) hours as needed for severe pain. (Patient not taking: Reported on 06/29/2017) 20 tablet 0  . ibuprofen (ADVIL,MOTRIN) 800 MG tablet Take 800 mg by mouth every 8 (eight) hours as needed for mild pain or moderate pain.    . methylPREDNISolone (MEDROL DOSEPAK) 4 MG TBPK tablet Take as per package insert (Patient not taking: Reported on 06/29/2017) 21 tablet 0  . oxyCODONE-acetaminophen (ROXICET) 5-325 MG tablet Take 1 tablet by mouth every 8 (eight) hours  as needed for severe pain. (Patient not taking: Reported on 06/29/2017) 30 tablet 0  . oxymetazoline (AFRIN NASAL SPRAY) 0.05 % nasal spray Place 1 spray into both nostrils 2 (two) times daily. Use only for 3days, then stop (Patient not taking: Reported on 06/29/2017) 30 mL 0   No current facility-administered medications on file prior to visit.     ROS ROS otherwise unremarkable unless listed above.  Physical Examination: BP (!) 149/90 (BP Location: Right Arm, Patient Position: Sitting, Cuff Size: Large)   Pulse 79   Temp 98.2 F (36.8 C) (Oral)   Resp 18   Ht 5' 9.33" (1.761 m)   Wt 234 lb 3.2 oz (106.2 kg)   SpO2 99%   BMI 34.26 kg/m  Ideal Body Weight: Weight in (lb) to have BMI =  25: 170.6  Physical Exam  Constitutional: He is oriented to person, place, and time. He appears well-developed and well-nourished. No distress.  HENT:  Head: Normocephalic and atraumatic.  Eyes: Pupils are equal, round, and reactive to light. Conjunctivae and EOM are normal.  Cardiovascular: Normal rate.   Pulmonary/Chest: Effort normal. No respiratory distress.  Lymphadenopathy:    He has no axillary adenopathy.  Neurological: He is alert and oriented to person, place, and time.  Skin: Skin is warm and dry. He is not diaphoretic.  1st digit at the the medial area of the distal thumb with swelling that spreads along the nearing cuticle.  He has no drainage upon palpation.  Slight ecchmosis in this area as well.  Tender with passive rom of distal joint.  Swelling and warmth along the forearm with erythematous streak that stretches to the medial area of the upper arm.    Psychiatric: He has a normal mood and affect. His behavior is normal.  Procedure: verbal consent obtained.  Cleansed with alcohol swab for digital block of the 1st digit.  Anesthesia obtained.  Procedure site cleansed with povidine swabs.  11 blade utilized to place an incision just at the cuticle.  Sanguinous fluid only expressed. Cleansed with saline.  Packing placed.     Dg Finger Thumb Right  Result Date: 06/27/2017 CLINICAL DATA:  right thumb pain at the more medial area of the distal digit, with pain and swelling up the arm. red streaking, and warmth. EXAM: RIGHT THUMB 2+V COMPARISON:  None. FINDINGS: Soft tissue swelling about the thumb. No radiopaque foreign object. No acute fracture or dislocation. No focal osseous lesion. No soft tissue gas. Mild degenerative changes at the first metacarpal phalangeal joint. IMPRESSION: Soft tissue swelling, without acute osseous abnormality. Electronically Signed   By: Abigail Miyamoto M.D.   On: 06/27/2017 17:38     Assessment and Plan: [name] is a 61 y.o. male who is here  today for cc of thumb pain. Area appears of paronychia, no heavy fluctuance to demonstrate felon.   Rocephin injection, and keflex qid given today. Follow up in 2 days.  Paronychia of finger of right hand - Plan: CANCELED: WOUND CULTURE  Thumb pain, right - Plan: POCT CBC, DG Finger Thumb Right, CANCELED: WOUND CULTURE  Lymphangitis - Plan: cefTRIAXone (ROCEPHIN) injection 1 g  Ivar Drape, PA-C [facility] 7/13/20189:54 AM

## 2017-07-02 NOTE — Progress Notes (Signed)
PRIMARY CARE AT [place], [place] [address] [phone]  Date:  06/29/2017   Name:  Juan Richards   DOB:  05/25/1956   MRN:  [id number]  PCP:  [facility], FNP    History of Present Illness:  Juan Richards is a 61 y.o. male patient who presents to PCP with  Chief Complaint  Patient presents with  . Follow-up    patient presents for a follow up recheck of his thumb     Patient was here 2 days ago, for forearm pain, thumb pain and pain of thumb.  Found to have lymphangitis with thumb swelling being the culprit.   Patient reports that he is doing somewhat better.  He has noticed that his forearm has improved rom and redness.  He notes no fever.  toleratign the keflex fine.  Has been compliant with the qid regimen.  demarkated erythema prior to exit 2 days ago, and this has improved and lessened.    Patient Active Problem List   Diagnosis Date Noted  . Routine general medical examination at a health care facility 08/11/2016  . LEG PAIN, RIGHT 11/30/2010  . DIVERTICULITIS, COLON, WITH PERFORATION 07/23/2009  . GROSS HEMATURIA 07/23/2009  . CONSTIPATION 09/27/2007  . 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 PLEURISY 08/31/2007  . EPIDIDYMAL CYST 08/31/2007    Past Medical History:  Diagnosis Date  . DIVERTICULITIS, COLON, WITH PERFORATION 07/23/2009  . EPIDIDYMAL CYST 08/31/2007  . PLEURISY   . STENOSIS, LUMBAR SPINE 09/27/2007    No past surgical history on file.  Social History  Substance Use Topics  . Smoking status: Never Smoker  . Smokeless tobacco: Never Used  . Alcohol use 1.2 - 1.8 oz/week    2 - 3 Cans of beer per week    Family History  Problem Relation Age of Onset  . Diabetes Mother   . Kidney disease Mother   . Cancer Father        ?  . Diabetes Maternal Grandmother     No Known Allergies  Medication list has been reviewed and updated.  Current Outpatient Prescriptions on File Prior to Visit  Medication Sig Dispense Refill   . cephALEXin (KEFLEX) 500 MG capsule Take 1 capsule (500 mg total) by mouth 4 (four) times daily. 28 capsule 0  . amoxicillin-clavulanate (AUGMENTIN) 875-125 MG tablet Take 1 tablet by mouth 2 (two) times daily. (Patient not taking: Reported on 06/29/2017) 14 tablet 0  . cyclobenzaprine (FLEXERIL) 10 MG tablet Take 1 tablet (10 mg total) by mouth 3 (three) times daily as needed for muscle spasms. (Patient not taking: Reported on 06/29/2017) 20 tablet 0  . fluticasone (FLONASE) 50 MCG/ACT nasal spray Place 2 sprays into both nostrils daily. (Patient not taking: Reported on 06/29/2017) 16 g 0  . guaiFENesin (MUCINEX) 600 MG 12 hr tablet Take 1 tablet (600 mg total) by mouth 2 (two) times daily as needed for cough or to loosen phlegm. (Patient not taking: Reported on 06/29/2017) 14 tablet 0  . HYDROmorphone (DILAUDID) 2 MG tablet Take 0.5 tablets (1 mg total) by mouth every 6 (six) hours as needed for severe pain. (Patient not taking: Reported on 06/29/2017) 20 tablet 0  . ibuprofen (ADVIL,MOTRIN) 800 MG tablet Take 800 mg by mouth every 8 (eight) hours as needed for mild pain or moderate pain.    . methylPREDNISolone (MEDROL DOSEPAK) 4 MG TBPK tablet Take as per package insert (Patient not taking: Reported on 06/29/2017) 21  tablet 0  . oxyCODONE-acetaminophen (ROXICET) 5-325 MG tablet Take 1 tablet by mouth every 8 (eight) hours as needed for severe pain. (Patient not taking: Reported on 06/29/2017) 30 tablet 0  . oxymetazoline (AFRIN NASAL SPRAY) 0.05 % nasal spray Place 1 spray into both nostrils 2 (two) times daily. Use only for 3days, then stop (Patient not taking: Reported on 06/29/2017) 30 mL 0   No current facility-administered medications on file prior to visit.     ROS ROS otherwise unremarkable unless listed above.  Physical Examination: BP 128/87 (BP Location: Left Arm, Patient Position: Sitting, Cuff Size: Large)   Pulse 80   Resp 16   Ht 5' 9.33" (1.761 m)   Wt 229 lb 12.8 oz (104.2 kg)    SpO2 98%   BMI 33.61 kg/m  Ideal Body Weight: Weight in (lb) to have BMI = 25: 170.6  Physical Exam  Constitutional: He is oriented to person, place, and time. He appears well-developed and well-nourished. No distress.  HENT:  Head: Normocephalic and atraumatic.  Eyes: Pupils are equal, round, and reactive to light. Conjunctivae and EOM are normal.  Cardiovascular: Normal rate.   Pulmonary/Chest: Effort normal. No respiratory distress.  Neurological: He is alert and oriented to person, place, and time.  Skin: Skin is warm and dry. Capillary refill takes less than 2 seconds. He is not diaphoretic.  erythema has improved some along the forearm and lessened along the upper arm.  Thumb movement is improving.  Packing in place without exudate upon considerable palpation.  Psychiatric: He has a normal mood and affect. His behavior is normal.     Assessment and Plan: Juan Richards is a 61 y.o. male who is here today for cc of wound care and lymphangitis recheck. Rtc in 3 days.  Improving.  Encouraged continuance of abx use.  Encounter for incision and drainage procedure  Lymphangitis  Paronychia of finger of right hand  Ivar Drape, PA-C [facility] 7/15/20189:08 AM

## 2017-07-03 ENCOUNTER — Encounter: Payer: Self-pay | Admitting: Physician Assistant

## 2017-07-03 ENCOUNTER — Ambulatory Visit (INDEPENDENT_AMBULATORY_CARE_PROVIDER_SITE_OTHER): Payer: 59 | Admitting: Physician Assistant

## 2017-07-03 VITALS — BP 142/85 | HR 71 | Temp 97.8°F | Resp 16 | Ht 68.5 in | Wt 228.0 lb

## 2017-07-03 DIAGNOSIS — L03011 Cellulitis of right finger: Secondary | ICD-10-CM

## 2017-07-03 DIAGNOSIS — I891 Lymphangitis: Secondary | ICD-10-CM

## 2017-07-03 DIAGNOSIS — Z09 Encounter for follow-up examination after completed treatment for conditions other than malignant neoplasm: Secondary | ICD-10-CM

## 2017-07-03 NOTE — Patient Instructions (Addendum)
Please take your medication as prescribed. Continue the soaks.      IF you received an x-ray today, you will receive an invoice from [facility]. Please contact [facility] at [phone] with questions or concerns regarding your invoice.   IF you received labwork today, you will receive an invoice from [facility]. Please contact [facility] at [phone] with questions or concerns regarding your invoice.   Our billing staff will not be able to assist you with questions regarding bills from these companies.  You will be contacted with the lab results as soon as they are available. The fastest way to get your results is to activate your My Chart account. Instructions are located on the last page of this paperwork. If you have not heard from [place] regarding the results in 2 weeks, please contact this office.

## 2017-07-05 NOTE — Progress Notes (Signed)
Chief Complaint  Patient presents with  . Follow-up    right thumb injury  Patient returns after 3 days, with improvement of lymphangitis, and thumb swelling.  He states that the area of which paronychia was incised is draining a clear and slightly translucent fluid.  He continues to do soaks.  Pain has improved on the right distal area of thumb.  No fevers.  Continues to take abx, but manages only three per day.      Current Outpatient Prescriptions on File Prior to Visit  Medication Sig Dispense Refill  . amoxicillin-clavulanate (AUGMENTIN) 875-125 MG tablet Take 1 tablet by mouth 2 (two) times daily. (Patient not taking: Reported on 06/29/2017) 14 tablet 0  . cyclobenzaprine (FLEXERIL) 10 MG tablet Take 1 tablet (10 mg total) by mouth 3 (three) times daily as needed for muscle spasms. (Patient not taking: Reported on 06/29/2017) 20 tablet 0  . fluticasone (FLONASE) 50 MCG/ACT nasal spray Place 2 sprays into both nostrils daily. (Patient not taking: Reported on 06/29/2017) 16 g 0  . guaiFENesin (MUCINEX) 600 MG 12 hr tablet Take 1 tablet (600 mg total) by mouth 2 (two) times daily as needed for cough or to loosen phlegm. (Patient not taking: Reported on 06/29/2017) 14 tablet 0  . HYDROmorphone (DILAUDID) 2 MG tablet Take 0.5 tablets (1 mg total) by mouth every 6 (six) hours as needed for severe pain. (Patient not taking: Reported on 06/29/2017) 20 tablet 0  . ibuprofen (ADVIL,MOTRIN) 800 MG tablet Take 800 mg by mouth every 8 (eight) hours as needed for mild pain or moderate pain.    . methylPREDNISolone (MEDROL DOSEPAK) 4 MG TBPK tablet Take as per package insert (Patient not taking: Reported on 06/29/2017) 21 tablet 0  . oxyCODONE-acetaminophen (ROXICET) 5-325 MG tablet Take 1 tablet by mouth every 8 (eight) hours as needed for severe pain. (Patient not taking: Reported on 06/29/2017) 30 tablet 0  . oxymetazoline (AFRIN NASAL SPRAY) 0.05 % nasal spray Place 1 spray into both nostrils 2 (two) times  daily. Use only for 3days, then stop (Patient not taking: Reported on 06/29/2017) 30 mL 0   No current facility-administered medications on file prior to visit.   Physical Exam  Constitutional: He is oriented to person, place, and time. He appears well-developed and well-nourished. No distress.  HENT:  Head: Normocephalic and atraumatic.  Eyes: Pupils are equal, round, and reactive to light. Conjunctivae and EOM are normal.  Cardiovascular: Normal rate.   Pulmonary/Chest: Effort normal. No respiratory distress.  Lymphadenopathy:    He has no axillary adenopathy.  Neurological: He is alert and oriented to person, place, and time.  Skin: Skin is warm and dry. He is not diaphoretic.  Incision site just latero-proximal to cuticle has serous/purulent drainage upon palpation.  Swelling improved at the medial area of the distal 1st digit.   Forearm without erythema or warmth.     Psychiatric: He has a normal mood and affect. His behavior is normal.    Assessment  advised to continue to soak and abx. Will follow up in about 4 days for recheck.  Likely last visit Paronychia of finger of right hand  Follow up  Lymphangitis  Ivar Drape, PA-C [facility] 7/19/20189:27 AM

## 2017-07-08 ENCOUNTER — Ambulatory Visit: Payer: 59 | Admitting: Urgent Care

## 2017-10-11 ENCOUNTER — Encounter: Payer: Self-pay | Admitting: Nurse Practitioner

## 2017-10-11 ENCOUNTER — Telehealth: Payer: Self-pay | Admitting: Family

## 2017-10-11 ENCOUNTER — Ambulatory Visit (INDEPENDENT_AMBULATORY_CARE_PROVIDER_SITE_OTHER): Payer: 59 | Admitting: Nurse Practitioner

## 2017-10-11 VITALS — BP 128/84 | HR 81 | Temp 98.0°F | Resp 16 | Ht 68.5 in | Wt 283.0 lb

## 2017-10-11 DIAGNOSIS — M48061 Spinal stenosis, lumbar region without neurogenic claudication: Secondary | ICD-10-CM

## 2017-10-11 DIAGNOSIS — M26609 Unspecified temporomandibular joint disorder, unspecified side: Secondary | ICD-10-CM

## 2017-10-11 DIAGNOSIS — Z1211 Encounter for screening for malignant neoplasm of colon: Secondary | ICD-10-CM

## 2017-10-11 MED ORDER — NAPROXEN 500 MG PO TABS: 500.0000 mg | ORAL_TABLET | Freq: Two times a day (BID) | ORAL | 0 refills | Status: DC

## 2017-10-11 MED ORDER — CYCLOBENZAPRINE HCL 5 MG PO TABS: 5.0000 mg | ORAL_TABLET | Freq: Every day | ORAL | 0 refills | Status: DC

## 2017-10-11 NOTE — Patient Instructions (Addendum)
I think your symptoms may be related to TMJ.  I've sent you prescription for naproxen, you may take this twice daily, and a prescription for a muscle relaxer, Flexeril, to take at bedtime. The flexeril can cause drowsiness. Don't drink alcohol, or operate machinery when you take this medicine.  Please follow the instructions I have given you for jaw exercises.  Let me know if your symptoms aren't improved next week.  It was nice to meet you. Thanks for letting me take care of you today :)   Jaw Range of Motion Exercises Jaw range of motion exercises are exercises that help your jaw to move better. These exercises can help to prevent:  Difficulty opening your mouth.  Pain in your jaw while it is both open and closed.  What should I be careful of when doing jaw exercises? Make sure that you only do jaw exercises as directed by your health care provider. You should only move your jaw as far as it can go in each direction, if told to do so by your health care provider. Do not move your jaw into positions that cause you any pain. What exercises should I do?  Stick your jaw forward. Hold this position for 1-2 seconds. Allow your jaw to return to its normal position and rest it there for 1-2 seconds. Do this exercise 8 times.  Stand or sit in front of a mirror. Place your tongue on the roof of your mouth, just behind your top teeth. Slowly open and close your jaw, keeping your tongue on the roof of your mouth. While you open and close your mouth, try to keep your jaw from moving toward one side or the other. Repeat this 8 times.  Move your jaw right. Hold this position for 1-2 seconds. Allow your jaw to return to its normal position, and rest it there for 1-2 seconds. Do this exercise 8 times.  Move your jaw left. Hold this position for 1-2 seconds. Allow your jaw to return to its normal position, and rest it there for 1-2 seconds. Do this exercise 8 times.  Open your mouth as far as it is  can comfortably go. Hold this position for 1-2 seconds. Then close your mouth and rest for 1-2 seconds. Do this exercise 8 times.  Move your jaw in a circular motion, starting toward the right (clockwise). Repeat this 8 times.  Move your jaw in a circular motion, starting toward the left (counterclockwise). Repeat this 8 times. Apply moist heat packs or ice packs to your jaw before or after performing your exercises as directed by your health care provider. What else can I do? Avoid the following, if they cause jaw pain or they increase your jaw pain:  Chewing gum.  Clenching your jaw or teeth or keeping tension in your jaw muscles.  Leaning on your jaw, such as resting your jaw in your hand while leaning on a desk.  This information is not intended to replace advice given to you by your health care provider. Make sure you discuss any questions you have with your health care provider. Document Released: 11/17/2008 Document Revised: 05/12/2016 Document Reviewed: 11/05/2014 Elsevier Interactive Patient Education  2018 Elsevier Inc.   Temporomandibular Joint Syndrome Temporomandibular joint (TMJ) syndrome is a condition that affects the joints between your jaw and your skull. The TMJs are located near your ears and allow your jaw to open and close. These joints and the nearby muscles are involved in all movements of the  jaw. People with TMJ syndrome have pain in the area of these joints and muscles. Chewing, biting, or other movements of the jaw can be difficult or painful. TMJ syndrome can be caused by various things. In many cases, the condition is mild and goes away within a few weeks. For some people, the condition can become a long-term problem. What are the causes? Possible causes of TMJ syndrome include:  Grinding your teeth or clenching your jaw. Some people do this when they are under stress.  Arthritis.  Injury to the jaw.  Head or neck injury.  Teeth or dentures that are  not aligned well.  In some cases, the cause of TMJ syndrome may not be known. What are the signs or symptoms? The most common symptom is an aching pain on the side of the head in the area of the TMJ. Other symptoms may include:  Pain when moving your jaw, such as when chewing or biting.  Being unable to open your jaw all the way.  Making a clicking sound when you open your mouth.  Headache.  Earache.  Neck or shoulder pain.  How is this diagnosed? Diagnosis can usually be made based on your symptoms, your medical history, and a physical exam. Your health care provider may check the range of motion of your jaw. Imaging tests, such as X-rays or an MRI, are sometimes done. You may need to see your dentist to determine if your teeth and jaw are lined up correctly. How is this treated? TMJ syndrome often goes away on its own. If treatment is needed, the options may include:  Eating soft foods and applying ice or heat.  Medicines to relieve pain or inflammation.  Medicines to relax the muscles.  A splint, bite plate, or mouthpiece to prevent teeth grinding or jaw clenching.  Relaxation techniques or counseling to help reduce stress.  Transcutaneous electrical nerve stimulation (TENS). This helps to relieve pain by applying an electrical current through the skin.  Acupuncture. This is sometimes helpful to relieve pain.  Jaw surgery. This is rarely needed.  Follow these instructions at home:  Take medicines only as directed by your health care provider.  Eat a soft diet if you are having trouble chewing.  Apply ice to the painful area. ? Put ice in a plastic bag. ? Place a towel between your skin and the bag. ? Leave the ice on for 20 minutes, 2-3 times a day.  Apply a warm compress to the painful area as directed.  Massage your jaw area and perform any jaw stretching exercises as recommended by your health care provider.  If you were given a mouthpiece or bite plate,  wear it as directed.  Avoid foods that require a lot of chewing. Do not chew gum.  Keep all follow-up visits as directed by your health care provider. This is important. Contact a health care provider if:  You are having trouble eating.  You have new or worsening symptoms. Get help right away if:  Your jaw locks open or closed. This information is not intended to replace advice given to you by your health care provider. Make sure you discuss any questions you have with your health care provider. Document Released: 08/30/2001 Document Revised: 08/04/2016 Document Reviewed: 07/10/2014 Elsevier Interactive Patient Education  [employer].

## 2017-10-11 NOTE — Telephone Encounter (Signed)
LVM for pt letting him know referral has been placed. I also let him know that his appointment with [facility] on 10/30 at 8:45 he will be seeing Dr. Sherley Bounds as well for any question answering.

## 2017-10-11 NOTE — Progress Notes (Signed)
   Subjective:    Patient ID: Juan Richards, male    DOB: 06-16-1956, 61 y.o.   MRN: [id number]  HPI Mr. Dewey is a 60 yo male who presents today to establish care. He Is transferring to me from another provider in the same clinic. He has a chief complaint today of jaw pain. The pain is a constant ache in his left jaw. The pain began about 2 weeks ago while he was yawning. He felt a pop and the pain has been ongoing since. He denies any head trauma, ear pain or drainage, headaches, facial pressure, congestion, sore throat, dental pain. He sees the dentist every 6 months for routine cleanings and x-rays.  Review of Systems  See HPI  Past Medical History:  Diagnosis Date  . DIVERTICULITIS, COLON, WITH PERFORATION 07/23/2009  . EPIDIDYMAL CYST 08/31/2007  . PLEURISY   . STENOSIS, LUMBAR SPINE 09/27/2007     Social History   Social History  . Marital status: Married    Spouse name: N/A  . Number of children: 1  . Years of education: 15   Occupational History  . Inventory Specialist    Social History Main Topics  . Smoking status: Never Smoker  . Smokeless tobacco: Never Used  . Alcohol use 1.2 - 1.8 oz/week    2 - 3 Cans of beer per week  . Drug use: No  . Sexual activity: Yes   Other Topics Concern  . Not on file   Social History Narrative   Fun: Watch TV    No past surgical history on file.  Family History  Problem Relation Age of Onset  . Diabetes Mother   . Kidney disease Mother   . Cancer Father        ?  . Diabetes Maternal Grandmother     No Known Allergies  No current outpatient prescriptions on file prior to visit.   No current facility-administered medications on file prior to visit.     BP 128/84 (BP Location: Left Arm, Patient Position: Sitting, Cuff Size: Large)   Pulse 81   Temp 98 F (36.7 C) (Oral)   Resp 16   Ht 5' 8.5" (1.74 m)   Wt 283 lb (128.4 kg)   SpO2 98%   BMI 42.40 kg/m       Objective:   Physical Exam  Constitutional:  He appears well-developed and well-nourished. No distress.  HENT:  Right Ear: Tympanic membrane, external ear and ear canal normal. No tenderness. No decreased hearing is noted.  Left Ear: Tympanic membrane, external ear and ear canal normal. No tenderness. No decreased hearing is noted.  Nose: Nose normal.  Mouth/Throat: Uvula is midline, oropharynx is clear and moist and mucous membranes are normal. Normal dentition. No dental abscesses or dental caries.  Left cheek/upper jaw with mild swelling. Right ear: copious cerumen. Jaw: Bilateral popping, left jaw clicking, decreased range of motion.       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 Audible jaw clicking and decreased ROM on physical exam. 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 Will treat with flexeril, naproxen, and TMJ exercises. Exercises provided on AVS. Return precautions discussed. Will consider additional treatment options if no improvement with conservative therapy.

## 2017-10-11 NOTE — Telephone Encounter (Signed)
Pt stopped by the front desk and states he was supposed to have a referral for a colonoscopy, please advise and enter

## 2017-10-17 DIAGNOSIS — R200 Anesthesia of skin: Secondary | ICD-10-CM | POA: Diagnosis not present

## 2017-10-17 DIAGNOSIS — R030 Elevated blood-pressure reading, without diagnosis of hypertension: Secondary | ICD-10-CM | POA: Diagnosis not present

## 2017-11-20 ENCOUNTER — Encounter: Payer: Self-pay | Admitting: Nurse Practitioner

## 2017-12-19 DIAGNOSIS — C61 Malignant neoplasm of prostate: Secondary | ICD-10-CM

## 2017-12-19 HISTORY — DX: Malignant neoplasm of prostate: C61

## 2018-03-19 ENCOUNTER — Encounter: Payer: Self-pay | Admitting: Physician Assistant

## 2018-05-11 ENCOUNTER — Encounter: Payer: Self-pay | Admitting: Nurse Practitioner

## 2018-05-11 ENCOUNTER — Ambulatory Visit: Payer: 59 | Admitting: Nurse Practitioner

## 2018-05-11 VITALS — BP 128/84 | HR 77 | Temp 98.2°F | Resp 16 | Ht 68.5 in | Wt 240.0 lb

## 2018-05-11 DIAGNOSIS — L989 Disorder of the skin and subcutaneous tissue, unspecified: Secondary | ICD-10-CM

## 2018-05-11 DIAGNOSIS — L0390 Cellulitis, unspecified: Secondary | ICD-10-CM

## 2018-05-11 MED ORDER — SULFAMETHOXAZOLE-TRIMETHOPRIM 800-160 MG PO TABS: 1.0000 | ORAL_TABLET | Freq: Two times a day (BID) | ORAL | 0 refills | Status: DC

## 2018-05-11 NOTE — Progress Notes (Signed)
Name: Juan Richards   MRN: [id number]    DOB: February 17, 1956   Date:05/11/2018       Progress Note  Subjective  Chief Complaint  Chief Complaint  Patient presents with  . Hand Pain    right hand thumb swelling and tenderness    HPI Juan Richards is here today for evaluation of right thumb pain and a skin lesion.  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 He denies fevers,chills, weakness, body aches, fatigue, streaking or spreading redness.  Skin lesion- This is not a new problem He reports painless lesion to right medial calf, present for years The lesion has not seemed to change over time He denies warmth, drainage, swelling.  Patient Active Problem List   Diagnosis Date Noted  . Routine general medical examination at a health care facility 08/11/2016  . LEG PAIN, RIGHT 11/30/2010  . DIVERTICULITIS, COLON, WITH PERFORATION 07/23/2009  . GROSS HEMATURIA 07/23/2009  . CONSTIPATION 09/27/2007  . 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 PLEURISY 08/31/2007  . EPIDIDYMAL CYST 08/31/2007    Social History   Tobacco Use  . Smoking status: Never Smoker  . Smokeless tobacco: Never Used  Substance Use Topics  . Alcohol use: Yes    Alcohol/week: 1.2 - 1.8 oz    Types: 2 - 3 Cans of beer per week    No current outpatient medications on file.  No Known Allergies  ROS  No other specific complaints in a complete review of systems (except as listed in HPI above).  Objective  Vitals:   05/11/18 1549  BP: 128/84  Pulse: 77  Resp: 16  Temp: 98.2 F (36.8 C)  TempSrc: Oral  SpO2: 97%  Weight: 240 lb (108.9 kg)  Height: 5' 8.5" (1.74 m)    Body mass index is 35.96  kg/m.  Nursing Note and Vital Signs reviewed.  Physical Exam  Constitutional: Patient appears well-developed and well-nourished. No distress.  HEENT: head atraumatic, normocephalic, pupils equal and reactive to light, EOM's intact, neck supple, oropharynx pink and moist without exudate Cardiovascular: Normal rate, regular rhythm, S1/S2 present.  No BLE edema. Distal pulses intact Pulmonary/Chest: Effort normal and breath sounds clear. No respiratory distress or retractions. Musculoskeletal: Normal range of motion, no gross deformities. Neurological: He is alert and oriented to person, place, and time. Coordination, balance, strength, speech and gait are normal.  Skin: Warm and dry. Edema and erythema noted to right thumb and lateral hand. Small, firm, non-tender, hyperpigmented nodule to RLE. Psychiatric: Patient has a normal mood and affect. behavior is normal. Judgment and thought content normal.   Assessment & Plan RTC for CPE  Cellulitis, unspecified cellulitis site H & PE suspicious for cellulitis Will prescribe course of bactrim-dosing and side effects discussed Home care,Red flags and when to present for emergency care or RTC including fever >101.18F, spreading redness,  new/worsening/un-resolving symptoms reviewed with patient at time of visit. Follow up and care instructions discussed and provided in AVS. - sulfamethoxazole-trimethoprim (BACTRIM DS,SEPTRA DS) 800-160 MG tablet; Take 1 tablet by mouth 2 (two) times daily.  Dispense: 14 tablet; Refill: 0  Skin lesion of right leg ?suspicoius for dermatofibroma, but recommend referral to  dermatology for further evaluation and management and he is agreeable  - Ambulatory referral to Dermatology

## 2018-05-11 NOTE — Patient Instructions (Addendum)
I have sent a prescription for bactrim 1 tablet twice daily for skin infection.  I have placed a referral to dermatology. Our office will call you to schedule this appointment. You should hear from our office in 7-10 days.   Cellulitis, Adult Cellulitis is a skin infection. The infected area is usually red and sore. This condition occurs most often in the arms and lower legs. It is very important to get treated for this condition. Follow these instructions at home:  Take over-the-counter and prescription medicines only as told by your doctor.  If you were prescribed an antibiotic medicine, take it as told by your doctor. Do not stop taking the antibiotic even if you start to feel better.  Drink enough fluid to keep your pee (urine) clear or pale yellow.  Do not touch or rub the infected area.  Raise (elevate) the infected area above the level of your heart while you are sitting or lying down.  Place warm or cold wet cloths (warm or cold compresses) on the infected area. Do this as told by your doctor.  Keep all follow-up visits as told by your doctor. This is important. These visits let your doctor make sure your infection is not getting worse. Contact a doctor if:  You have a fever.  Your symptoms do not get better after 1-2 days of treatment.  Your bone or joint under the infected area starts to hurt after the skin has healed.  Your infection comes back. This can happen in the same area or another area.  You have a swollen bump in the infected area.  You have new symptoms.  You feel ill and also have muscle aches and pains. Get help right away if:  Your symptoms get worse.  You feel very sleepy.  You throw up (vomit) or have watery poop (diarrhea) for a long time.  There are red streaks coming from the infected area.  Your red area gets larger.  Your red area turns darker. This information is not intended to replace advice given to you by your health care provider.  Make sure you discuss any questions you have with your health care provider. Document Released: 05/23/2008 Document Revised: 05/12/2016 Document Reviewed: 10/14/2015 Elsevier Interactive Patient Education  2018 [employer].

## 2018-05-28 ENCOUNTER — Encounter: Payer: Self-pay | Admitting: Nurse Practitioner

## 2018-05-30 DIAGNOSIS — D3617 Benign neoplasm of peripheral nerves and autonomic nervous system of trunk, unspecified: Secondary | ICD-10-CM | POA: Diagnosis not present

## 2018-05-30 DIAGNOSIS — D235 Other benign neoplasm of skin of trunk: Secondary | ICD-10-CM | POA: Diagnosis not present

## 2018-06-15 ENCOUNTER — Encounter: Payer: 59 | Admitting: Nurse Practitioner

## 2018-06-18 ENCOUNTER — Ambulatory Visit (INDEPENDENT_AMBULATORY_CARE_PROVIDER_SITE_OTHER): Payer: 59 | Admitting: Nurse Practitioner

## 2018-06-18 ENCOUNTER — Other Ambulatory Visit (INDEPENDENT_AMBULATORY_CARE_PROVIDER_SITE_OTHER): Payer: 59

## 2018-06-18 ENCOUNTER — Encounter: Payer: Self-pay | Admitting: Nurse Practitioner

## 2018-06-18 VITALS — BP 136/80 | HR 66 | Temp 98.6°F | Resp 16 | Ht 68.5 in | Wt 238.0 lb

## 2018-06-18 DIAGNOSIS — Z1211 Encounter for screening for malignant neoplasm of colon: Secondary | ICD-10-CM

## 2018-06-18 DIAGNOSIS — E66812 Obesity, class 2: Secondary | ICD-10-CM

## 2018-06-18 DIAGNOSIS — E669 Obesity, unspecified: Secondary | ICD-10-CM

## 2018-06-18 DIAGNOSIS — Z13220 Encounter for screening for lipoid disorders: Secondary | ICD-10-CM

## 2018-06-18 DIAGNOSIS — Z6835 Body mass index (BMI) 35.0-35.9, adult: Secondary | ICD-10-CM

## 2018-06-18 DIAGNOSIS — Z125 Encounter for screening for malignant neoplasm of prostate: Secondary | ICD-10-CM

## 2018-06-18 DIAGNOSIS — R7309 Other abnormal glucose: Secondary | ICD-10-CM | POA: Diagnosis not present

## 2018-06-18 DIAGNOSIS — Z114 Encounter for screening for human immunodeficiency virus [HIV]: Secondary | ICD-10-CM

## 2018-06-18 DIAGNOSIS — Z0000 Encounter for general adult medical examination without abnormal findings: Secondary | ICD-10-CM

## 2018-06-18 LAB — COMPREHENSIVE METABOLIC PANEL
ALBUMIN: 4.5 g/dL (ref 3.5–5.2)
ALT: 17 U/L (ref 0–53)
AST: 17 U/L (ref 0–37)
Alkaline Phosphatase: 48 U/L (ref 39–117)
BILIRUBIN TOTAL: 0.4 mg/dL (ref 0.2–1.2)
BUN: 17 mg/dL (ref 6–23)
CALCIUM: 9.8 mg/dL (ref 8.4–10.5)
CO2: 30 mEq/L (ref 19–32)
CREATININE: 1.08 mg/dL (ref 0.40–1.50)
Chloride: 105 mEq/L (ref 96–112)
GFR: 88.96 mL/min (ref >60.00)
Glucose, Bld: 86 mg/dL (ref 70–99)
POTASSIUM: 4.6 meq/L (ref 3.5–5.1)
Sodium: 140 mEq/L (ref 135–145)
Total Protein: 7.6 g/dL (ref 6.0–8.3)

## 2018-06-18 LAB — LIPID PANEL
CHOLESTEROL: 169 mg/dL (ref 0–200)
HDL: 57.9 mg/dL (ref >39.00)
LDL CALC: 88 mg/dL (ref 0–99)
NonHDL: 110.81
TRIGLYCERIDES: 116 mg/dL (ref 0.0–149.0)
Total CHOL/HDL Ratio: 3
VLDL: 23.2 mg/dL (ref 0.0–40.0)

## 2018-06-18 LAB — TSH: TSH: 1.99 u[IU]/mL (ref 0.35–4.50)

## 2018-06-18 LAB — CBC
HCT: 39.8 % (ref 39.0–52.0)
Hemoglobin: 13.1 g/dL (ref 13.0–17.0)
MCHC: 32.9 g/dL (ref 30.0–36.0)
MCV: 91.8 fl (ref 78.0–100.0)
Platelets: 230 10*3/uL (ref 150.0–400.0)
RBC: 4.34 Mil/uL (ref 4.22–5.81)
RDW: 13.9 % (ref 11.5–15.5)
WBC: 8.8 10*3/uL (ref 4.0–10.5)

## 2018-06-18 LAB — PSA: PSA: 60.97 ng/mL — ABNORMAL HIGH (ref 0.10–4.00)

## 2018-06-18 LAB — HEMOGLOBIN A1C: Hgb A1c MFr Bld: 6.2 % (ref 4.6–6.5)

## 2018-06-18 NOTE — Assessment & Plan Note (Signed)
-  Prostate cancer screening and PSA options (with potential risks and benefits of testing vs not testing) were discussed along with recent recs/guidelines. -USPSTF grade A and B recommendations reviewed with patient; age-appropriate recommendations, preventive care, screening tests, etc discussed and encouraged; healthy living and sunscreen use encouraged; see AVS for patient education given to patient. Advanced directives packet given to patient. -Discussed importance of 150 minutes of physical activity weekly,  eat 6 servings of fruit/vegetables daily and drink plenty of water and avoid sweet beverages.  -Follow up and care instructions discussed and provided in AVS.   -Reviewed Health Maintenance:  Screening for HIV (human immunodeficiency virus)- HIV antibody; Future Colon cancer screening- Ambulatory referral to Gastroenterology  Screening for prostate cancer- PSA; Future  Screening for cholesterol level He is not fasting, ate about 5-6 hours prior to lab draw Update lipid panel F/U with further recommendations pending lab results - Lipid panel; Future  Elevated glucose level  - CBC; Future - Comprehensive metabolic panel; Future - TSH; Future - Hemoglobin A1c; Future  Class 2 obesity with body mass index (BMI) of 35.0 to 35.9 in adult, unspecified obesity type, unspecified whether serious comorbidity present - CBC; Future - Comprehensive metabolic panel; Future - Lipid panel; Future - TSH; Future - Hemoglobin A1c; Future

## 2018-06-18 NOTE — Progress Notes (Signed)
Name: Juan Richards   MRN: [id number]    DOB: 04-07-1956   Date:06/18/2018       Progress Note  Subjective  Chief Complaint  Chief Complaint  Patient presents with  . CPE    HPI  Patient presents for annual CPE.  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 but no exercise routine  Depression: no concerns for anxiety or depression today Depression screen [facility] 2/9 07/03/2017 06/29/2017 06/27/2017 08/11/2016 06/20/2015  Decreased Interest 0 - 0 0 0  Down, Depressed, Hopeless 0 0 0 0 0  PHQ - 2 Score 0 0 0 0 0   Hypertension: BP Readings from Last 3 Encounters:  06/18/18 136/80  05/11/18 128/84  10/11/17 128/84   Obesity: Wt Readings from Last 3 Encounters:  06/18/18 238 lb (108 kg)  05/11/18 240 lb (108.9 kg)  10/11/17 283 lb (128.4 kg)   BMI Readings from Last 3 Encounters:  06/18/18 35.66 kg/m  05/11/18 35.96 kg/m  10/11/17 42.40 kg/m    Lipids: lipid panel today Lab Results  Component Value Date   CHOL 189 08/11/2016   Lab Results  Component Value Date   HDL 75.60 08/11/2016   Lab Results  Component Value Date   LDLCALC 100 (H) 08/11/2016   Lab Results  Component Value Date   TRIG 69.0 08/11/2016   Lab Results  Component Value Date   CHOLHDL 3 08/11/2016   No results found for: LDLDIRECT  Glucose: A1c today Glucose, Bld  Date Value Ref Range Status  08/11/2016 93 70 - 99 mg/dL Final  07/24/2009 98 70 - 99 mg/dL Final  07/22/2009 113 (H) 70 - 99 mg/dL Final   Alcohol: about 3-4 drinks per week Tobacco use: no, never  STD testing and prevention (chl/gon/syphilis): No concerns, declines screening HIV, hep C: hepatitis C screening done, HIV screening ordered today  Skin cancer: does not routinely wear sunscreen, recently established with dermatology for annual skin check  Colorectal cancer: No personal or family history of colon ca, no abdominal pain, no bowel  changes. Colonoscopy overdue, referral placed today  Prostate cancer: update PSA today Lab Results  Component Value Date   PSA 28.90 (H) 08/11/2016   IPSS Questionnaire (AUA-7): Over the past month.   1)  How often have you had a sensation of not emptying your bladder completely after you finish urinating?  0 - Not at all  2)  How often have you had to urinate again less than two hours after you finished urinating? 0 - Not at all  3)  How often have you found you stopped and started again several times when you urinated?  0 - Not at all  4) How difficult have you found it to postpone urination?  0 - Not at all  5) How often have you had a weak urinary stream?  0 - Not at all  6) How often have you had to push or strain to begin urination?  0 - Not at all  7) How many times did you most typically get up to urinate from the time you went to bed until the time you got up in the morning?  1 - 1 time  Total score:  1   Aspirin: not taking ECG: not indicated  Vaccinations: up to date  Advanced Care Planning: A voluntary discussion about advance care planning including the explanation and discussion of advance directives.  Discussed health care proxy and Living will, and the patient DOES NOT have a living will at present time. If patient does have living will, I have requested they bring this to the clinic to be scanned in to their chart.  Patient Active Problem List   Diagnosis Date Noted  . Routine general medical examination at a health care facility 08/11/2016  . LEG PAIN, RIGHT 11/30/2010  . DIVERTICULITIS, COLON, WITH PERFORATION 07/23/2009  . GROSS HEMATURIA 07/23/2009  . CONSTIPATION 09/27/2007  . 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 PLEURISY 08/31/2007  . EPIDIDYMAL CYST 08/31/2007    No past surgical history on file.  Family History  Problem Relation Age of Onset  . Diabetes Mother   . Kidney disease Mother   . Cancer Father        ?  . Diabetes Maternal Grandmother      Social History   Socioeconomic History  . Marital status: Married    Spouse name: Not on file  . Number of children: 1  . Years of education: 56  . Highest education level: Not on file  Occupational History  . Occupation: Production assistant, radio  Social Needs  . Financial resource strain: Not on file  . Food insecurity:    Worry: Not on file    Inability: Not on file  . Transportation needs:    Medical: Not on file    Non-medical: Not on file  Tobacco Use  . Smoking status: Never Smoker  . Smokeless tobacco: Never Used  Substance and Sexual Activity  . Alcohol use: Yes    Alcohol/week: 1.2 - 1.8 oz    Types: 2 - 3 Cans of beer per week  . Drug use: No  . Sexual activity: Yes  Lifestyle  . Physical activity:    Days per week: Not on file    Minutes per session: Not on file  . Stress: Not on file  Relationships  . Social connections:    Talks on phone: Not on file    Gets together: Not on file    Attends religious service: Not on file    Active member of club or organization: Not on file    Attends meetings of clubs or organizations: Not on file    Relationship status: Not on file  . Intimate partner violence:    Fear of current or ex partner: Not on file    Emotionally abused: Not on file    Physically abused: Not on file    Forced sexual activity: Not on file  Other Topics Concern  . Not on file  Social History Narrative   Fun: Watch TV    No current outpatient medications on file.  No Known Allergies   ROS  Constitutional: Negative for fever or weight change.  Respiratory: Negative for cough and shortness of breath.   Cardiovascular: Negative for chest pain or palpitations.  Gastrointestinal: Negative for abdominal pain, no bowel changes.  Musculoskeletal: Negative for gait problem or joint swelling.  Skin: Negative for rash.  Neurological: Negative for dizziness or headache.  No other specific complaints in a complete review of systems (except as  listed in HPI above).   Objective  Vitals:   06/18/18 1502  BP: 136/80  Pulse: 66  Resp: 16  Temp: 98.6 F (37 C)  TempSrc: Oral  SpO2: 98%  Weight: 238 lb (108 kg)  Height: 5' 8.5" (1.74 m)    Body mass index is 35.66 kg/m.  Physical Exam Vital signs  reviewed. Constitutional: Patient appears well-developed and well-nourished. No distress.  HENT: Head: Normocephalic and atraumatic. Ears: B TMs ok, no erythema or effusion, copious cerumen; Nose: Nose normal. Mouth/Throat: Oropharynx is clear and moist. No oropharyngeal exudate.  Eyes: Conjunctivae and EOM are normal. Pupils are equal, round, and reactive to light. No scleral icterus.  Neck: Normal range of motion. Neck supple. No cervical adenopathy. No thyromegaly present.  Cardiovascular: Normal rate, regular rhythm and normal heart sounds.  No murmur heard. No BLE edema. Distal pulses intact Pulmonary/Chest: Effort normal and breath sounds normal. No respiratory distress. Abdominal: Soft. Bowel sounds are normal, no distension. There is no tenderness. no masses Musculoskeletal: Normal range of motion, No gross deformities Neurological: he is alert and oriented to person, place, and time. No cranial nerve deficit. Coordination, balance, strength, speech and gait are normal.  Skin: Skin is warm and dry. No rash noted. No erythema.  Psychiatric: Patient has a normal mood and affect. behavior is normal. Judgment and thought content normal.  Assessment & Plan RTC in 1 year for CPE

## 2018-06-18 NOTE — Patient Instructions (Signed)
Please head downstairs for lab work. If any of your test results are critically abnormal, you will be contacted right away. Otherwise, I will contact you within a week about your test results and any recommendations for abnormalities.  I will plan to see you back in 1 year, or sooner if needed.   Health Maintenance, Male A healthy lifestyle and preventive care is important for your health and wellness. Ask your health care provider about what schedule of regular examinations is right for you. What should I know about weight and diet? Eat a Healthy Diet  Eat plenty of vegetables, fruits, whole grains, low-fat dairy products, and lean protein.  Do not eat a lot of foods high in solid fats, added sugars, or salt.  Maintain a Healthy Weight Regular exercise can help you achieve or maintain a healthy weight. You should:  Do at least 150 minutes of exercise each week. The exercise should increase your heart rate and make you sweat (moderate-intensity exercise).  Do strength-training exercises at least twice a week.  Watch Your Levels of Cholesterol and Blood Lipids  Have your blood tested for lipids and cholesterol every 5 years starting at 62 years of age. If you are at high risk for heart disease, you should start having your blood tested when you are 62 years old. You may need to have your cholesterol levels checked more often if: ? Your lipid or cholesterol levels are high. ? You are older than 62 years of age. ? You are at high risk for heart disease.  What should I know about cancer screening? Many types of cancers can be detected early and may often be prevented. Lung Cancer  You should be screened every year for lung cancer if: ? You are a current smoker who has smoked for at least 30 years. ? You are a former smoker who has quit within the past 15 years.  Talk to your health care provider about your screening options, when you should start screening, and how often you should  be screened.  Colorectal Cancer  Routine colorectal cancer screening usually begins at 62 years of age and should be repeated every 5-10 years until you are 62 years old. You may need to be screened more often if early forms of precancerous polyps or small growths are found. Your health care provider may recommend screening at an earlier age if you have risk factors for colon cancer.  Your health care provider may recommend using home test kits to check for hidden blood in the stool.  A small camera at the end of a tube can be used to examine your colon (sigmoidoscopy or colonoscopy). This checks for the earliest forms of colorectal cancer.  Prostate and Testicular Cancer  Depending on your age and overall health, your health care provider may do certain tests to screen for prostate and testicular cancer.  Talk to your health care provider about any symptoms or concerns you have about testicular or prostate cancer.  Skin Cancer  Check your skin from head to toe regularly.  Tell your health care provider about any new moles or changes in moles, especially if: ? There is a change in a mole's size, shape, or color. ? You have a mole that is larger than a pencil eraser.  Always use sunscreen. Apply sunscreen liberally and repeat throughout the day.  Protect yourself by wearing long sleeves, pants, a wide-brimmed hat, and sunglasses when outside.  What should I know about heart disease, diabetes,  and high blood pressure?  If you are 56-32 years of age, have your blood pressure checked every 3-5 years. If you are 14 years of age or older, have your blood pressure checked every year. You should have your blood pressure measured twice-once when you are at a hospital or clinic, and once when you are not at a hospital or clinic. Record the average of the two measurements. To check your blood pressure when you are not at a hospital or clinic, you can use: ? An automated blood pressure machine at  a pharmacy. ? A home blood pressure monitor.  Talk to your health care provider about your target blood pressure.  If you are between 3-68 years old, ask your health care provider if you should take aspirin to prevent heart disease.  Have regular diabetes screenings by checking your fasting blood sugar level. ? If you are at a normal weight and have a low risk for diabetes, have this test once every three years after the age of 1. ? If you are overweight and have a high risk for diabetes, consider being tested at a younger age or more often.  A one-time screening for abdominal aortic aneurysm (AAA) by ultrasound is recommended for men aged 61-75 years who are current or former smokers. What should I know about preventing infection? Hepatitis B If you have a higher risk for hepatitis B, you should be screened for this virus. Talk with your health care provider to find out if you are at risk for hepatitis B infection. Hepatitis C Blood testing is recommended for:  Everyone born from [birth year] through 1965.  Anyone with known risk factors for hepatitis C.  Sexually Transmitted Diseases (STDs)  You should be screened each year for STDs including gonorrhea and chlamydia if: ? You are sexually active and are younger than 62 years of age. ? You are older than 62 years of age and your health care provider tells you that you are at risk for this type of infection. ? Your sexual activity has changed since you were last screened and you are at an increased risk for chlamydia or gonorrhea. Ask your health care provider if you are at risk.  Talk with your health care provider about whether you are at high risk of being infected with HIV. Your health care provider may recommend a prescription medicine to help prevent HIV infection.  What else can I do?  Schedule regular health, dental, and eye exams.  Stay current with your vaccines (immunizations).  Do not use any tobacco products, such as  cigarettes, chewing tobacco, and e-cigarettes. If you need help quitting, ask your health care provider.  Limit alcohol intake to no more than 2 drinks per day. One drink equals 12 ounces of beer, 5 ounces of wine, or 1 ounces of hard liquor.  Do not use street drugs.  Do not share needles.  Ask your health care provider for help if you need support or information about quitting drugs.  Tell your health care provider if you often feel depressed.  Tell your health care provider if you have ever been abused or do not feel safe at home. This information is not intended to replace advice given to you by your health care provider. Make sure you discuss any questions you have with your health care provider. Document Released: 06/02/2008 Document Revised: 08/03/2016 Document Reviewed: 09/08/2015 Elsevier Interactive Patient Education  [employer].

## 2018-06-19 LAB — HIV ANTIBODY (ROUTINE TESTING W REFLEX): HIV 1&2 Ab, 4th Generation: NONREACTIVE

## 2018-06-20 ENCOUNTER — Telehealth: Payer: Self-pay | Admitting: Nurse Practitioner

## 2018-06-20 ENCOUNTER — Other Ambulatory Visit: Payer: Self-pay | Admitting: Nurse Practitioner

## 2018-06-20 DIAGNOSIS — R7303 Prediabetes: Secondary | ICD-10-CM | POA: Insufficient documentation

## 2018-06-20 DIAGNOSIS — R972 Elevated prostate specific antigen [PSA]: Secondary | ICD-10-CM

## 2018-06-20 NOTE — Telephone Encounter (Signed)
Documented in result note.  

## 2018-06-20 NOTE — Telephone Encounter (Signed)
Copied from [place] [id number]. Topic: Quick Communication - Lab Results >> Jun 20, 2018  2:19 PM Lorrin Jackson, [name] wrote: Called patient to inform them of 06/18/18 lab results. When patient returns call, triage nurse may disclose results.  Patient called and states he missed a call in reference to lab results   CB# [phone]

## 2018-06-26 DIAGNOSIS — R972 Elevated prostate specific antigen [PSA]: Secondary | ICD-10-CM | POA: Diagnosis not present

## 2018-07-12 ENCOUNTER — Encounter: Payer: Self-pay | Admitting: Nurse Practitioner

## 2018-08-06 DIAGNOSIS — R972 Elevated prostate specific antigen [PSA]: Secondary | ICD-10-CM | POA: Diagnosis not present

## 2018-08-13 ENCOUNTER — Other Ambulatory Visit: Payer: Self-pay | Admitting: Urology

## 2018-08-13 DIAGNOSIS — C61 Malignant neoplasm of prostate: Secondary | ICD-10-CM

## 2018-08-17 ENCOUNTER — Ambulatory Visit (HOSPITAL_COMMUNITY)
Admission: RE | Admit: 2018-08-17 | Discharge: 2018-08-17 | Disposition: A | Discharge status: Home / Self Care | Payer: 59 | Source: Ambulatory Visit | Attending: Urology | Admitting: Urology

## 2018-08-17 ENCOUNTER — Encounter (HOSPITAL_COMMUNITY)
Admission: RE | Admit: 2018-08-17 | Discharge: 2018-08-17 | Disposition: A | Discharge status: Home / Self Care | Payer: 59 | Source: Ambulatory Visit | Attending: Urology | Admitting: Urology

## 2018-08-17 DIAGNOSIS — C61 Malignant neoplasm of prostate: Secondary | ICD-10-CM | POA: Diagnosis not present

## 2018-08-17 MED ORDER — TECHNETIUM TC 99M MEDRONATE IV KIT
20.5000 | PACK | Freq: Once | INTRAVENOUS | Status: AC | PRN
  Administered 2018-08-17: 20.5 via INTRAVENOUS

## 2018-08-21 DIAGNOSIS — C61 Malignant neoplasm of prostate: Secondary | ICD-10-CM | POA: Diagnosis not present

## 2018-08-30 ENCOUNTER — Other Ambulatory Visit: Payer: Self-pay | Admitting: Urology

## 2018-08-31 ENCOUNTER — Telehealth: Payer: Self-pay | Admitting: Medical Oncology

## 2018-08-31 NOTE — Telephone Encounter (Signed)
Left message requesting a return call to discuss referral to the [facility].  

## 2018-09-03 ENCOUNTER — Telehealth: Payer: Self-pay | Admitting: Medical Oncology

## 2018-09-03 NOTE — Telephone Encounter (Signed)
I called pt to introduce myself as the Prostate Nurse Navigator and the Coordinator of the Pro[facility].  1. I confirmed with the patient he is aware of his referral to the clinic 09/11/18 arriving at 12:30 pm.  2. I discussed the format of the clinic and the physicians he will be seeing that day.  3. I discussed where the clinic is located and how to contact me.  4. I confirmed his address and informed him I would be mailing a packet of information and forms to be completed. I asked him to bring them with him the day of his appointment.   He voiced understanding of the above. I asked him to call me if he has any questions or concerns regarding his appointments or the forms he needs to complete.

## 2018-09-06 ENCOUNTER — Encounter: Payer: Self-pay | Admitting: Medical Oncology

## 2018-09-10 ENCOUNTER — Telehealth: Payer: Self-pay | Admitting: Medical Oncology

## 2018-09-10 NOTE — Telephone Encounter (Signed)
Left appointment reminder for [facility] 09/11/18 arriving at 12:30 pm. I reviewed [place] parking, registration and reminded him bring completed medical forms. I also asked him to have lunch before arrival due to the length of clinic. I asked him to call me back to confirm.

## 2018-09-10 NOTE — Patient Instructions (Addendum)
Juan Richards  09/10/2018   Your procedure is scheduled on: 09-21-18  Report to [facility] Main  Entrance  Report to admitting at 530 AM    Call this number if you have problems the morning of surgery [phone]   Remember: Do not eat food  :After Midnight Wednesday night, clear liquids all day Thursday 09-20-18, no clear liquids after midnight Thursday night.. BRUSH YOUR TEETH MORNING OF SURGERY AND RINSE YOUR MOUTH OUT, NO CHEWING GUM CANDY OR MINTS.  FOLLOW ALL BOWEL PREP INSTRUCTIONS FROM DR Louis Meckel    CLEAR LIQUID DIET   Foods Allowed                                                                     Foods Excluded  Coffee and tea, regular and decaf                             liquids that you cannot  Plain Jell-O in any flavor                                             see through such as: Fruit ices (not with fruit pulp)                                     milk, soups, orange juice  Iced Popsicles                                    All solid food Carbonated beverages, regular and diet                                    Cranberry, grape and apple juices Sports drinks like Gatorade Lightly seasoned clear broth or consume(fat free) Sugar, honey syrup  Sample Menu Breakfast                                Lunch                                     Supper Cranberry juice                    Beef broth                            Chicken broth Jell-O                                     Grape juice  Apple juice Coffee or tea                        Jell-O                                      Popsicle                                                Coffee or tea                        Coffee or tea  _____________________________________________________________________    Take these medicines the morning of surgery with A SIP OF WATER: none                               You may not have any metal on your body including hair pins  and              piercings  Do not wear jewelry, make-up, lotions, powders or perfumes, deodorant             Do not wear nail polish.  Do not shave  48 hours prior to surgery.              Men may shave face and neck.   Do not bring valuables to the hospital. [place].  Contacts, dentures or bridgework may not be worn into surgery.  Leave suitcase in the car. After surgery it may be brought to your room.                 Please read over the following fact sheets you were given: _____________________________________________________________________             [facility] - Preparing for Surgery Before surgery, you can play an important role.  Because skin is not sterile, your skin needs to be as free of germs as possible.  You can reduce the number of germs on your skin by washing with CHG (chlorahexidine gluconate) soap before surgery.  CHG is an antiseptic cleaner which kills germs and bonds with the skin to continue killing germs even after washing. Please DO NOT use if you have an allergy to CHG or antibacterial soaps.  If your skin becomes reddened/irritated stop using the CHG and inform your nurse when you arrive at Short Stay. Do not shave (including legs and underarms) for at least 48 hours prior to the first CHG shower.  You may shave your face/neck. Please follow these instructions carefully:  1.  Shower with CHG Soap the night before surgery and the  morning of Surgery.  2.  If you choose to wash your hair, wash your hair first as usual with your  normal  shampoo.  3.  After you shampoo, rinse your hair and body thoroughly to remove the  shampoo.                           4.  Use CHG as you would any other liquid soap.  You can apply chg directly  to the skin and wash                       Gently with a scrungie or clean washcloth.  5.  Apply the CHG Soap to your body ONLY FROM THE NECK DOWN.   Do not use on face/ open                            Wound or open sores. Avoid contact with eyes, ears mouth and genitals (private parts).                       Wash face,  Genitals (private parts) with your normal soap.             6.  Wash thoroughly, paying special attention to the area where your surgery  will be performed.  7.  Thoroughly rinse your body with warm water from the neck down.  8.  DO NOT shower/wash with your normal soap after using and rinsing off  the CHG Soap.                9.  Pat yourself dry with a clean towel.            10.  Wear clean pajamas.            11.  Place clean sheets on your bed the night of your first shower and do not  sleep with pets. Day of Surgery : Do not apply any lotions/deodorants the morning of surgery.  Please wear clean clothes to the hospital/surgery center.  FAILURE TO FOLLOW THESE INSTRUCTIONS MAY RESULT IN THE CANCELLATION OF YOUR SURGERY PATIENT SIGNATURE_________________________________  NURSE SIGNATURE__________________________________  ________________________________________________________________________  WHAT IS A BLOOD TRANSFUSION? Blood Transfusion Information  A transfusion is the replacement of blood or some of its parts. Blood is made up of multiple cells which provide different functions.  Red blood cells carry oxygen and are used for blood loss replacement.  White blood cells fight against infection.  Platelets control bleeding.  Plasma helps clot blood.  Other blood products are available for specialized needs, such as hemophilia or other clotting disorders. BEFORE THE TRANSFUSION  Who gives blood for transfusions?   Healthy volunteers who are fully evaluated to make sure their blood is safe. This is blood bank blood. Transfusion therapy is the safest it has ever been in the practice of medicine. Before blood is taken from a donor, a complete history is taken to make sure that person has no history of diseases nor engages in risky social behavior  (examples are intravenous drug use or sexual activity with multiple partners). The donor's travel history is screened to minimize risk of transmitting infections, such as malaria. The donated blood is tested for signs of infectious diseases, such as HIV and hepatitis. The blood is then tested to be sure it is compatible with you in order to minimize the chance of a transfusion reaction. If you or a relative donates blood, this is often done in anticipation of surgery and is not appropriate for emergency situations. It takes many days to process the donated blood. RISKS AND COMPLICATIONS Although transfusion therapy is very safe and saves many lives, the main dangers of transfusion include:   Getting an infectious disease.  Developing a transfusion reaction. This is an allergic reaction to something in the blood you were  given. Every precaution is taken to prevent this. The decision to have a blood transfusion has been considered carefully by your caregiver before blood is given. Blood is not given unless the benefits outweigh the risks. AFTER THE TRANSFUSION  Right after receiving a blood transfusion, you will usually feel much better and more energetic. This is especially true if your red blood cells have gotten low (anemic). The transfusion raises the level of the red blood cells which carry oxygen, and this usually causes an energy increase.  The nurse administering the transfusion will monitor you carefully for complications. HOME CARE INSTRUCTIONS  No special instructions are needed after a transfusion. You may find your energy is better. Speak with your caregiver about any limitations on activity for underlying diseases you may have. SEEK MEDICAL CARE IF:   Your condition is not improving after your transfusion.  You develop redness or irritation at the intravenous (IV) site. SEEK IMMEDIATE MEDICAL CARE IF:  Any of the following symptoms occur over the next 12 hours:  Shaking  chills.  You have a temperature by mouth above 102 F (38.9 C), not controlled by medicine.  Chest, back, or muscle pain.  People around you feel you are not acting correctly or are confused.  Shortness of breath or difficulty breathing.  Dizziness and fainting.  You get a rash or develop hives.  You have a decrease in urine output.  Your urine turns a dark color or changes to pink, red, or brown. Any of the following symptoms occur over the next 10 days:  You have a temperature by mouth above 102 F (38.9 C), not controlled by medicine.  Shortness of breath.  Weakness after normal activity.  The white part of the eye turns yellow (jaundice).  You have a decrease in the amount of urine or are urinating less often.  Your urine turns a dark color or changes to pink, red, or brown. Document Released: 12/02/2000 Document Revised: 02/27/2012 Document Reviewed: 07/21/2008 [facility] Patient Information 2014 [place], [place].  _______________________________________________________________________

## 2018-09-10 NOTE — Progress Notes (Signed)
GU Location of Tumor / Histology: prostatic adenocarcinoma  If Prostate Cancer, Gleason Score is (4 + 3) and PSA is (69). Prostate volume: 48 grams.   Juan Richards returns September 3 for follow up evaluation with [name] after last being seen in July. The patient was seen for elevated PSA in August 2017. He was subsequently lost to follow up secondary to spinal surgery.   Biopsies of prostate (if applicable) revealed:    Past/Anticipated interventions by urology, if any: prostate biopsy, referral to [facility]  Past/Anticipated interventions by medical oncology, if any: no  Weight changes, if any: no  Bowel/Bladder complaints, if any: IPSS 8. SHIM 9.   Nausea/Vomiting, if any: no  Pain issues, if any:  no  SAFETY ISSUES:  Prior radiation? no  Pacemaker/ICD? no  Possible current pregnancy? no  Is the patient on methotrexate? no  Current Complaints / other details:  62 year old male. Married with one daughter. Work in Geophysical data processor. Father with hx of cancer.

## 2018-09-11 ENCOUNTER — Ambulatory Visit
Admission: RE | Admit: 2018-09-11 | Discharge: 2018-09-11 | Disposition: A | Discharge status: Home / Self Care | Payer: 59 | Source: Ambulatory Visit | Attending: Radiation Oncology | Admitting: Radiation Oncology

## 2018-09-11 ENCOUNTER — Encounter: Payer: Self-pay | Admitting: Medical Oncology

## 2018-09-11 ENCOUNTER — Inpatient Hospital Stay: Payer: 59 | Attending: Oncology | Admitting: Oncology

## 2018-09-11 ENCOUNTER — Encounter: Payer: Self-pay | Admitting: General Practice

## 2018-09-11 ENCOUNTER — Other Ambulatory Visit: Payer: Self-pay

## 2018-09-11 ENCOUNTER — Encounter: Payer: Self-pay | Admitting: Radiation Oncology

## 2018-09-11 DIAGNOSIS — R972 Elevated prostate specific antigen [PSA]: Secondary | ICD-10-CM | POA: Diagnosis not present

## 2018-09-11 DIAGNOSIS — C61 Malignant neoplasm of prostate: Secondary | ICD-10-CM | POA: Insufficient documentation

## 2018-09-11 DIAGNOSIS — N401 Enlarged prostate with lower urinary tract symptoms: Secondary | ICD-10-CM | POA: Diagnosis not present

## 2018-09-11 DIAGNOSIS — G629 Polyneuropathy, unspecified: Secondary | ICD-10-CM | POA: Diagnosis not present

## 2018-09-11 HISTORY — DX: Malignant neoplasm of prostate: C61

## 2018-09-11 NOTE — Progress Notes (Signed)
                               Care Plan Summary  Name: [name] DOB: [birth year]   Your Medical Team:   Urologist -  Dr. Raynelle Bring, [facility]  Radiation Oncologist - Dr. Tyler Pita, [facility]   Medical Oncologist - Dr. Zola Button, [place]  Recommendations: 1) Prostatectomy   2) Androgen deprivation for 2 years with radiation   * These recommendations are based on information available as of today's consult.      Recommendations may change depending on the results of further tests or exams.  Next Steps: 1) Prostatectomy is scheduled with [name] 09/21/18    When appointments need to be scheduled, you will be contacted by [facility] and/or [facility].  Questions?  Please do not hesitate to call Cira Rue, RN, BSN, OCN at [phone] any questions or concerns.  Shirlean Mylar is your Oncology Nurse Navigator and is available to assist you while you're receiving your medical care at [facility].

## 2018-09-11 NOTE — Progress Notes (Signed)
[place] Psychosocial Distress Screening Spiritual Care  Met with Juan Richards and his wife in [facility] to introduce [place] team/resources, reviewing distress screen per protocol.  The patient scored a 8 on the Psychosocial Distress Thermometer which indicates severe distress. Also assessed for distress and other psychosocial needs.   ONCBCN DISTRESS SCREENING 09/11/2018  Screening Type Initial Screening  Distress experienced in past week (1-10) 8  Emotional problem type Nervousness/Anxiety  Physical Problem type Sleep/insomnia;Tingling hands/feet;Sexual problems  Referral to support programs Yes   Meeting team and resolving unknown via PMDC helped decrease his anxiety. He anticipates sleeping better tonight. Per pt, now that he is more clear about dx and tx plan, he feels more comfortable and prepared to share with his family, too. Normalized feelings, providing empathic listening and pastoral reflection. Encouraged Prostate Cancer Support Group and counseling as resources for additional support for pt and spouse.  Follow up needed: No. Per couple, no other needs at this time, but please page if needs arise or circumstances change. Thank you.   [place], [place], [facility] Pager [phone] Voicemail [phone]

## 2018-09-11 NOTE — Consult Note (Signed)
[facility]     09/11/2018   --------------------------------------------------------------------------------   Juan Richards  MRN: [id number]  PRIMARY CARE:  Juan Richards  DOB: [birth year], 62 year old Male  REFERRING:    SSN:-[id number]  PROVIDER:  Louis Richards, M.D.    TREATING:  Juan Richards, M.D.    LOCATION:  [facility] [phone]   --------------------------------------------------------------------------------   CC/HPI: CC: Prostate Cancer   Physician requesting consult: Juan Richards  PCP: Juan Chestnut, NP  Location of consult: [facility]   Juan Richards is a 62 year old gentleman who had initially seen Dr. Louis Richards in the summer of 2017 when his PSA was 28. He was scheduled to undergo a prostate biopsy but developed severe lower extremity pain and fecal incontinence and was referred for further evaluation and eventually underwent surgery for severe spinal stenosis. He did not follow up after that until July 2019 when his PCP noted his PSA to have further increased to 69.97. He again saw Dr. Louis Richards and was noted to also have induration of the majority of the right side of the prostate and proceeded with a TRUS biopsy of the prostate on 08/06/18 that confirmed Gleason 4+3=7 adenocarcinoma of the prostate with 9 out of 12 biopsy cores positive for malignancy. He was noted to have a small median lobe on TRUS.   Family history: None.   Imaging studies:  CT pelvis (08/17/18): Negative for pelvic lymphadenopathy or other evidence of metastatic disease. 2.7 cm lesion within prostate.  Bone scan (08/17/18): Negative for metastatic disease.   PMH: He has a history of spinal stenosis s/p surgery in 2017.  PSH: No abdominal surgeries.   TNM stage: cT2b N0 M0  PSA: 69.97  Gleason score: 4+3=7  Biopsy (08/06/18): 9/12 cores positive  Left: L apex (5%, 3+3=6), L mid (20%, 3+4=7), L  lateral base (50%, 3+4=7)  Right: R apex (95%, 3+4=7), R lateral apex (90%, 4+3=7, PNI), R mid (90%, 3+4=7), R lateral mid (90%, 4+3=7, PNI), R base (80%, 3+4=7, PNI), R lateral base (90%, 3+4=7, PNI)  Prostate volume: 48.0 cc   Nomogram  OC disease: 1%  EPE: 99%  SVI: 83%  LNI: 82%  PFS (5 year, 10 year): 9%, 5%   Urinary function: IPSS is 8.  Erectile function: SHIM score is 9.     ALLERGIES: No Allergies    MEDICATIONS: No Medications    GU PSH: Prostate Needle Biopsy - 08/06/2018, 08/06/2018    NON-GU PSH: Back surgery - about 2017 Surgical Pathology, Gross And Microscopic Examination For Prostate Needle - 08/06/2018, 08/06/2018    GU PMH: Prostate Cancer - 08/21/2018 Elevated PSA - 06/26/2018, - 2017    NON-GU PMH: No Non-GU PMH    FAMILY HISTORY: Cancer - Father Diabetes - Mother Hypertension - Mother Kidney Failure - Mother   SOCIAL HISTORY: Marital Status: Married Preferred Language: English; Ethnicity: Not Hispanic Or Latino; Race: Black or African American Current Smoking Status: Patient has never smoked.  Light Drinker.  Drinks 1 caffeinated drink per day. 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     Notes: 1 daughter    REVIEW OF SYSTEMS:    GU Review Male:   Patient denies frequent urination, hard to postpone urination, burning/ pain with urination, get up at night to urinate, leakage of urine, stream starts and stops, trouble starting your streams, and have to strain to urinate .  Gastrointestinal (Lower):   Patient denies diarrhea  and constipation.  Gastrointestinal (Upper):   Patient denies nausea and vomiting.  Constitutional:   Patient denies fever, night sweats, weight loss, and fatigue.  Skin:   Patient denies skin rash/ lesion and itching.  Eyes:   Patient denies blurred vision and double vision.  Ears/ Nose/ Throat:   Patient denies sinus problems and sore throat.  Hematologic/Lymphatic:   Patient denies swollen glands and easy bruising.   Cardiovascular:   Patient denies leg swelling and chest pains.  Respiratory:   Patient denies cough and shortness of breath.  Endocrine:   Patient denies excessive thirst.  Musculoskeletal:   Patient denies back pain and joint pain.  Neurological:   Patient denies headaches and dizziness.  Psychologic:   Patient denies depression and anxiety.   VITAL SIGNS: None   MULTI-SYSTEM PHYSICAL EXAMINATION:    Constitutional: Well-nourished. No physical deformities. Normally developed. Good grooming.     PAST DATA REVIEWED:  Source Of History:  Patient  Lab Test Review:   PSA  Records Review:   Pathology Reports  X-Ray Review: C.T. Pelvis: Reviewed Films.  Bone Scan: Reviewed Films.     PROCEDURES: None   ASSESSMENT:      ICD-10 Details  1 GU:   Prostate Cancer - C61    PLAN:           Document Letter(s):  Created for Patient: Clinical Summary         Notes:   1. High-risk prostate cancer: Juan Richards is currently scheduled to proceed with surgical treatment under the care of Dr. Louis Richards next week. He does confirm his decision to proceed with primary surgical therapy. However, he does understand the high risk for necessitating adjuvant or salvage therapy based on his high risk disease. He is quite informed about his current situation and has met with both Dr. Alen Richards and Dr. Tammi Richards today. All questions were answered to his stated satisfaction.   CC: Juan Petty, NP  Juan Richards  Juan Richards  Juan Richards        Next Appointment:      Next Appointment: 09/21/2018 07:15 AM    Appointment Type: Surgery     Location: [facility] [phone]    Provider: Louis Richards, M.D.    Reason for Visit: OBS WL ROB AST LAP RAD PROSTATECTOMY BIL PLND

## 2018-09-11 NOTE — Progress Notes (Signed)
Radiation Oncology         [phone] ________________________________  Multidisciplinary Prostate Cancer Clinic  Initial Radiation Oncology Consultation  Name: Juan Richards MRN: [id number]  Date: 09/11/2018  DOB: 1956-10-05  EL:[id number], Delphia Grates, NP  Lance Sell, NP   REFERRING PHYSICIAN: Lance Sell, NP  DIAGNOSIS: 62 y.o. gentleman with stage T2a adenocarcinoma of the prostate with a Gleason's score of 4+3 and a PSA of 69.97    ICD-10-CM   1. Malignant neoplasm of prostate ([place]) C61     HISTORY OF PRESENT ILLNESS::Juan Richards is a 62 y.o. gentleman with a diagnosis of prostate cancer.  He was initially seen in urology for elevated PSA of 28 in August 2017.  He was scheduled to undergo biopsy at that time but was lost to follow-up secondary to spinal surgery.  He was then noted to have a significantly elevated PSA of 69.97 in July 2019 by his primary care provider, Caesar Chestnut, NP.   Accordingly, he was referred back for evaluation in urology to [name] on 06/26/2018, where a digital rectal examination was performed at that time revealing an enlarged prostate, asymmetric and firm throughout, with a nodular irregular lesion involving the majority of the right lobe.  There did not appear to be any extraprostatic extension.  The patient proceeded to transrectal ultrasound with 12 biopsies of the prostate on 08/06/2018.  The prostate volume measured 48 cc.  Out of 12 core biopsies, 9 were positive.  The maximum Gleason score was 4+3, and this was seen in the right mid lateral and right apex lateral.  There was Gleason 3+4 in the left base lateral, left mid, right base, right mid, right apex, and right base lateral.  There was Gleason 3+3 in the left apex.   Biopsies of prostate revealed:    Staging scans on 08/17/2018 include CT Pelvis which showed a mildly enlarged prostate. There was a 2.7 cm lesion in the left medial and lateral peripheral zone of  prostate, likely representing site of primary prostate carcinoma. No evidence of pelvic metastatic disease. His bone scan was negative for osseous metastatic disease.  The patient reviewed the biopsy results with his urologist and he has kindly been referred today to the multidisciplinary prostate cancer clinic for presentation of pathology and radiology studies in our conference for discussion of potential radiation treatment options and clinical evaluation.   Of note, the patient's past medical history is largely unremarkable except for his spinal stenosis which he had surgery for in November 2015.  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    PAST SURGICAL HISTORY: Past Surgical History:  Procedure Laterality Date  . BACK SURGERY  2017   L4 BACK SURGERY    FAMILY HISTORY: family history includes Cancer in his father; Diabetes in his maternal grandmother and mother; Kidney disease in his mother.  SOCIAL HISTORY:  reports that he has never smoked. He has never used smokeless tobacco. He reports that he drinks about 2.0 - 3.0 standard drinks of alcohol per week. He reports that he does not use drugs. Married with one daughter. 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  ALLERGIES: Patient has no known allergies.  MEDICATIONS:  No current outpatient medications on file.   No current facility-administered medications for this encounter.     REVIEW OF SYSTEMS:  On review  of systems, the patient reports that he is doing well overall. He denies any chest pain, shortness of breath, cough, fevers, or chills. He reports night sweats and weight change. He denies any bowel disturbances, and denies abdominal pain, nausea or vomiting. He reports chronic back pain but denies any new musculoskeletal or joint aches or pains.  His IPSS was 8, indicating moderate urinary symptoms with urgency, weak stream, and nocturia x1. His SHIM score is 9, indicating he is able to complete sexual activity with few attempts. A complete review of systems is obtained and is otherwise negative.   PHYSICAL EXAM:  Wt Readings from Last 3 Encounters:  09/13/18 237 lb 6.4 oz (107.7 kg)  09/11/18 236 lb 9.6 oz (107.3 kg)  06/18/18 238 lb (108 kg)   Temp Readings from Last 3 Encounters:  09/13/18 98.7 F (37.1 C) (Oral)  09/11/18 98.1 F (36.7 C) (Oral)  06/18/18 98.6 F (37 C) (Oral)   BP Readings from Last 3 Encounters:  09/13/18 138/90  09/11/18 (!) 144/89  06/18/18 136/80   Pulse Readings from Last 3 Encounters:  09/13/18 78  09/11/18 75  06/18/18 66   Pain Assessment Pain Score: 0-No pain/10  In general this is a well appearing African American male in no acute distress. He is alert and oriented x4 and appropriate throughout the examination. HEENT reveals that the patient is normocephalic, atraumatic. EOMs are intact. PERRLA. Skin is intact without any evidence of gross lesions. Cardiovascular exam reveals a regular rate and rhythm, no clicks rubs or murmurs are auscultated. Chest is clear to auscultation bilaterally. Lymphatic assessment is performed and does not reveal any adenopathy in the cervical, supraclavicular, axillary, or inguinal chains. Abdomen has active bowel sounds in all quadrants and is intact. The abdomen is soft, non tender, non distended. Lower extremities are negative for pretibial pitting edema, deep calf tenderness, cyanosis or clubbing.  KPS = 100  100 - Normal; no complaints; no evidence of disease. 90   - Able to carry on normal activity; minor signs or symptoms of disease. 80   - Normal activity with effort; some signs or symptoms of disease. 6   - Cares for self; unable to carry on normal activity or to do active work. 60   - Requires occasional assistance, but is able to care for most of his  personal needs. 50   - Requires considerable assistance and frequent medical care. 80   - Disabled; requires special care and assistance. 5   - Severely disabled; hospital admission is indicated although death not imminent. 65   - Very sick; hospital admission necessary; active supportive treatment necessary. 10   - Moribund; fatal processes progressing rapidly. 0     - Dead  Karnofsky DA, Abelmann Redfield, Craver LS and Burchenal [facility] [phone]) The use of the nitrogen mustards in the palliative treatment of carcinoma: with particular reference to bronchogenic carcinoma Cancer 1 [id number]   LABORATORY DATA:  Lab Results  Component Value Date   WBC 5.8 09/13/2018   HGB 12.9 (L) 09/13/2018   HCT 39.0 09/13/2018   MCV 91.5 09/13/2018   PLT 248 09/13/2018   Lab Results  Component Value Date   NA 141 09/13/2018   K 3.8 09/13/2018   CL 107 09/13/2018   CO2 26 09/13/2018   Lab Results  Component Value Date   ALT 17 06/18/2018   AST 17 06/18/2018   ALKPHOS 48 06/18/2018   BILITOT 0.4 06/18/2018     RADIOGRAPHY: Nm  Bone Scan Whole Body  Result Date: 08/17/2018 CLINICAL DATA:  Prostate cancer, rising PSA EXAM: NUCLEAR MEDICINE WHOLE BODY BONE SCAN TECHNIQUE: Whole body anterior and posterior images were obtained approximately 3 hours after intravenous injection of radiopharmaceutical. RADIOPHARMACEUTICALS:  20.5 mCi Technetium-15m MDP IV COMPARISON:  None Correlation: CT pelvis 08/17/2018 FINDINGS: Uptake at the shoulders, elbows, and knees, typically degenerative. Minimal uptake at RIGHT costovertebral junction region of approximately T1, favor degenerative. No definite sites of abnormal osseous tracer accumulation are identified to suggest osseous metastatic disease. Expected urinary tract and soft tissue distribution of tracer. IMPRESSION: No definite scintigraphic evidence of osseous metastatic disease. Electronically Signed   By: Lavonia Dana M.D.   On: 08/17/2018 18:10      IMPRESSION/PLAN: 62  y.o. gentleman with a high risk, stage T2a adenocarcinoma of the prostate with a PSA of 69.97 and a Gleason score of 4+3.    Today we reviewed the findings and workup thus far.  We discussed the natural history of prostate cancer. We reviewed the the implications of T-stage, Gleason's Score, and PSA on decision-making and outcomes related to prostate cancer.  We discussed radiation treatment in the management of prostate cancer with regard to the logistics and delivery of external beam radiation treatment as well as the logistics and delivery of prostate brachytherapy.  We compared and contrasted each of these approaches and also compared these against prostatectomy.    The patient focused most of his questions and interest in robotic-assisted laparoscopic radical prostatectomy.  We discussed some of the potential advantages of surgery including surgical staging, the availability of salvage radiotherapy to the prostatic fossa, and the confidence associated with immediate biochemical response.  We discussed some of the potential proven indications for postoperative radiotherapy including positive margins, extracapsular extension and seminal vesicle involvement. We also talked about some of the other potential findings leading to a recommendation for radiotherapy including a non-zero postoperative PSA and positive lymph nodes.   At the conclusion of our conversation, the patient elects to proceed with prostatectomy which is tentatively scheduled for 09/21/2018 with [name]. We enjoyed meeting with him today, and will look forward to following his progress.  We spent more than 50% of today's visit in counseling and/or coordination of care.   Nicholos Johns, PA-C    Tyler Pita, MD  [facility] Direct Dial: [phone]  Fax: [phone] [email or web address]  Skype  [email or web address]  This document serves as a record of services personally performed by Tyler Pita, MD and  Freeman Caldron, PA-C. It was created on their behalf by Rae Lips, a trained medical scribe. The creation of this record is based on the scribe's personal observations and the providers' statements to them. This document has been checked and approved by the attending providers.

## 2018-09-11 NOTE — Progress Notes (Signed)
Reason for the request: Prostate cancer     HPI: I was asked by [name] to evaluate [name] for diagnosis of prostate cancer.  He is a 62 year old man currently of [place] where he lived majority of his life.  He is a rather healthy gentleman who was noted to have an elevated PSA in 2017.  At that time his PSA was 28 and was scheduled to have a prostate biopsy but was delayed because of spinal surgery.  Subsequently he had a repeat PSA in 2019 and at that time his PSA was up to 69.97.  He underwent a prostate biopsy at that time and on August 06, 2018 showed 9 out of 12 cores are positive for prostate cancer.  His Gleason score was 4+3 equal 7 and at least 2 cores.  His prostate volume was 48 g.  He is a staging work-up at that time including a CT scan and bone scan which showed no evidence of metastatic disease.  He is asymptomatic at this time from his prostate cancer and denies any urinary symptoms.  He denies any frequency urgency or hesitancy.  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   He does not report any headaches, blurry vision, syncope or seizures. Does not report any fevers, chills or sweats.  Does not report any cough, wheezing or hemoptysis.  Does not report any chest pain, palpitation, orthopnea or leg edema.  Does not report any nausea, vomiting or abdominal pain.  Does not report any constipation or diarrhea.  Does not report any skeletal complaints.    Does not report frequency, urgency or hematuria.  Does not report any skin rashes or lesions. Does not report any heat or cold intolerance.  Does not report any lymphadenopathy or petechiae.  Does not report any anxiety or depression.  Remaining review of systems is negative.    Past Medical History:  Diagnosis Date  . DIVERTICULITIS, COLON, WITH PERFORATION 07/23/2009  . EPIDIDYMAL CYST 08/31/2007  . PLEURISY   . STENOSIS, LUMBAR SPINE 09/27/2007  :  No past surgical history on  file.:  No current outpatient medications on file.:  No Known Allergies:  Family History  Problem Relation Age of Onset  . Diabetes Mother   . Kidney disease Mother   . Cancer Father        ?  . Diabetes Maternal Grandmother   :  Social History   Socioeconomic History  . Marital status: Married    Spouse name: Not on file  . Number of children: 1  . Years of education: 63  . Highest education level: Not on file  Occupational History  . Occupation: Production assistant, radio  Social Needs  . Financial resource strain: Not on file  . Food insecurity:    Worry: Not on file    Inability: Not on file  . Transportation needs:    Medical: Not on file    Non-medical: Not on file  Tobacco Use  . Smoking status: Never Smoker  . Smokeless tobacco: Never Used  Substance and Sexual Activity  . Alcohol use: Yes    Alcohol/week: 2.0 - 3.0 standard drinks    Types: 2 - 3 Cans of beer per week  . Drug use: No  . Sexual activity: Yes  Lifestyle  . Physical activity:    Days per week: Not on file    Minutes per session: Not on file  . Stress: Not on file  Relationships  .  Social connections:    Talks on phone: Not on file    Gets together: Not on file    Attends religious service: Not on file    Active member of club or organization: Not on file    Attends meetings of clubs or organizations: Not on file    Relationship status: Not on file  . Intimate partner violence:    Fear of current or ex partner: Not on file    Emotionally abused: Not on file    Physically abused: Not on file    Forced sexual activity: Not on file  Other Topics Concern  . Not on file  Social History Narrative   Fun: Watch TV  :  Pertinent items are noted in HPI.  Exam: ECOG 0 General appearance: alert and cooperative appeared without distress. Head: atraumatic without any abnormalities. Eyes: conjunctivae/corneas clear. PERRL.  Sclera anicteric. Throat: lips, mucosa, and tongue normal; without  oral thrush or ulcers. Resp: clear to auscultation bilaterally without rhonchi, wheezes or dullness to percussion. Cardio: regular rate and rhythm, S1, S2 normal, no murmur, click, rub or gallop GI: soft, non-tender; bowel sounds normal; no masses,  no organomegaly Skin: Skin color, texture, turgor normal. No rashes or lesions Lymph nodes: Cervical, supraclavicular, and axillary nodes normal. Neurologic: Grossly normal without any motor, sensory or deep tendon reflexes. Musculoskeletal: No joint deformity or effusion.   Nm Bone Scan Whole Body  Result Date: 08/17/2018 CLINICAL DATA:  Prostate cancer, rising PSA EXAM: NUCLEAR MEDICINE WHOLE BODY BONE SCAN TECHNIQUE: Whole body anterior and posterior images were obtained approximately 3 hours after intravenous injection of radiopharmaceutical. RADIOPHARMACEUTICALS:  20.5 mCi Technetium-52m MDP IV COMPARISON:  None Correlation: CT pelvis 08/17/2018 FINDINGS: Uptake at the shoulders, elbows, and knees, typically degenerative. Minimal uptake at RIGHT costovertebral junction region of approximately T1, favor degenerative. No definite sites of abnormal osseous tracer accumulation are identified to suggest osseous metastatic disease. Expected urinary tract and soft tissue distribution of tracer. IMPRESSION: No definite scintigraphic evidence of osseous metastatic disease. Electronically Signed   By: Lavonia Dana M.D.   On: 08/17/2018 18:10    Assessment and Plan:   62 year old man with prostate cancer diagnosed in August 2019.  His Gleason score was 4+3 equal 7 and multiple cores and PSA of 69.97 with staging work-up showed no evidence of metastatic disease at this time.  His case was discussed in the prostate cancer multidisciplinary clinic.  He is prostate biopsy was reviewed with the reviewing pathologist.  Imaging studies were also discussed with radiology.  The natural course of high risk of prostate cancer was discussed today with the patient and his  family.  Treatment options at the time include primary surgical therapy versus definitive therapy with radiation and androgen deprivation.  The rationale and risks and benefits of both approaches were reviewed.  He understands that he might require androgen deprivation therapy as well as radiation therapy after surgery depending on the status of his disease at the time.  After discussion today, he is favoring surgery at this time as the primary modality.  I also discussed with him the role of systemic chemotherapy and either systemic approach to treat advanced prostate cancer which are not curative at the time.  All his questions were answered to his satisfaction.  30  minutes was spent with the patient face-to-face today.  More than 50% of time was dedicated to patient counseling, education and answering question regarding his future plan of care.   Thank you  for the referral.   A copy of this consult has been forwarded to the requesting physician.

## 2018-09-12 ENCOUNTER — Telehealth: Payer: Self-pay

## 2018-09-12 NOTE — Telephone Encounter (Signed)
Per 9/24 no los 

## 2018-09-13 ENCOUNTER — Encounter (HOSPITAL_COMMUNITY)
Admission: RE | Admit: 2018-09-13 | Discharge: 2018-09-13 | Disposition: A | Discharge status: Home / Self Care | Payer: 59 | Source: Ambulatory Visit | Attending: Urology | Admitting: Urology

## 2018-09-13 ENCOUNTER — Other Ambulatory Visit: Payer: Self-pay

## 2018-09-13 ENCOUNTER — Encounter (HOSPITAL_COMMUNITY): Payer: Self-pay

## 2018-09-13 DIAGNOSIS — Z01812 Encounter for preprocedural laboratory examination: Secondary | ICD-10-CM | POA: Insufficient documentation

## 2018-09-13 HISTORY — DX: Paresthesia of skin: R20.2

## 2018-09-13 HISTORY — DX: Anesthesia of skin: R20.0

## 2018-09-13 LAB — BASIC METABOLIC PANEL
ANION GAP: 8 (ref 5–15)
BUN: 12 mg/dL (ref 8–23)
CO2: 26 mmol/L (ref 22–32)
Calcium: 9.3 mg/dL (ref 8.9–10.3)
Chloride: 107 mmol/L (ref 98–111)
Creatinine, Ser: 0.94 mg/dL (ref 0.61–1.24)
GFR calc non Af Amer: >60 mL/min (ref >60)
Glucose, Bld: 90 mg/dL (ref 70–99)
POTASSIUM: 3.8 mmol/L (ref 3.5–5.1)
SODIUM: 141 mmol/L (ref 135–145)

## 2018-09-13 LAB — CBC
HCT: 39 % (ref 39.0–52.0)
HEMOGLOBIN: 12.9 g/dL — AB (ref 13.0–17.0)
MCH: 30.3 pg (ref 26.0–34.0)
MCHC: 33.1 g/dL (ref 30.0–36.0)
MCV: 91.5 fL (ref 78.0–100.0)
Platelets: 248 10*3/uL (ref 150–400)
RBC: 4.26 MIL/uL (ref 4.22–5.81)
RDW: 13.8 % (ref 11.5–15.5)
WBC: 5.8 10*3/uL (ref 4.0–10.5)

## 2018-09-13 NOTE — Progress Notes (Signed)
   09/13/18 1603  OBSTRUCTIVE SLEEP APNEA  Have you ever been diagnosed with sleep apnea through a sleep study? No  Do you snore loudly (loud enough to be heard through closed doors)?  1  Do you often feel tired, fatigued, or sleepy during the daytime (such as falling asleep during driving or talking to someone)? 0  Has anyone observed you stop breathing during your sleep? 1  Do you have, or are you being treated for high blood pressure? 0  BMI more than 35 kg/m2? 1  Age > 50 (1-yes) 1  Neck circumference greater than:Male 16 inches or larger, Male 17inches or larger? 0  Male Gender (Yes=1) 1  Obstructive Sleep Apnea Score 5

## 2018-09-14 LAB — ABO/RH: ABO/RH(D): O POS

## 2018-09-15 LAB — URINE CULTURE: Culture: NO GROWTH

## 2018-09-21 ENCOUNTER — Ambulatory Visit (HOSPITAL_COMMUNITY)
Admission: RE | Admit: 2018-09-21 | Discharge: 2018-09-22 | Disposition: A | Discharge status: Home / Self Care | Payer: 59 | Source: Ambulatory Visit | Attending: Urology | Admitting: Urology

## 2018-09-21 ENCOUNTER — Encounter (HOSPITAL_COMMUNITY)
Admission: RE | Disposition: A | Discharge status: Home / Self Care | Payer: Self-pay | Source: Ambulatory Visit | Attending: Urology

## 2018-09-21 ENCOUNTER — Ambulatory Visit (HOSPITAL_COMMUNITY): Payer: 59 | Admitting: Anesthesiology

## 2018-09-21 ENCOUNTER — Encounter (HOSPITAL_COMMUNITY): Payer: Self-pay

## 2018-09-21 ENCOUNTER — Other Ambulatory Visit: Payer: Self-pay

## 2018-09-21 DIAGNOSIS — E669 Obesity, unspecified: Secondary | ICD-10-CM | POA: Diagnosis not present

## 2018-09-21 DIAGNOSIS — Z6835 Body mass index (BMI) 35.0-35.9, adult: Secondary | ICD-10-CM | POA: Diagnosis not present

## 2018-09-21 DIAGNOSIS — D630 Anemia in neoplastic disease: Secondary | ICD-10-CM | POA: Diagnosis not present

## 2018-09-21 DIAGNOSIS — C775 Secondary and unspecified malignant neoplasm of intrapelvic lymph nodes: Secondary | ICD-10-CM | POA: Diagnosis not present

## 2018-09-21 DIAGNOSIS — C61 Malignant neoplasm of prostate: Secondary | ICD-10-CM | POA: Diagnosis present

## 2018-09-21 HISTORY — PX: ROBOT ASSISTED LAPAROSCOPIC RADICAL PROSTATECTOMY: SHX5141

## 2018-09-21 HISTORY — PX: PELVIC LYMPH NODE DISSECTION: SHX6543

## 2018-09-21 LAB — TYPE AND SCREEN
ABO/RH(D): O POS
ANTIBODY SCREEN: NEGATIVE

## 2018-09-21 LAB — HEMOGLOBIN AND HEMATOCRIT, BLOOD
HEMATOCRIT: 39.2 % (ref 39.0–52.0)
HEMOGLOBIN: 12.6 g/dL — AB (ref 13.0–17.0)

## 2018-09-21 SURGERY — PROSTATECTOMY, RADICAL, ROBOT-ASSISTED, LAPAROSCOPIC
Anesthesia: General

## 2018-09-21 MED ORDER — MIDAZOLAM HCL 5 MG/5ML IJ SOLN
INTRAMUSCULAR | Status: DC | PRN
  Administered 2018-09-21: 2 mg via INTRAVENOUS

## 2018-09-21 MED ORDER — FENTANYL CITRATE (PF) 100 MCG/2ML IJ SOLN
INTRAMUSCULAR | Status: AC
  Filled 2018-09-21: qty 2

## 2018-09-21 MED ORDER — ONDANSETRON HCL 4 MG/2ML IJ SOLN
INTRAMUSCULAR | Status: AC
  Filled 2018-09-21: qty 4

## 2018-09-21 MED ORDER — PHENOL 1.4 % MT LIQD
1.0000 | OROMUCOSAL | Status: DC | PRN
  Filled 2018-09-21: qty 177

## 2018-09-21 MED ORDER — LACTATED RINGERS IR SOLN
Status: DC | PRN
  Administered 2018-09-21: 1

## 2018-09-21 MED ORDER — LABETALOL HCL 5 MG/ML IV SOLN
INTRAVENOUS | Status: AC
  Filled 2018-09-21: qty 4

## 2018-09-21 MED ORDER — LIDOCAINE 2% (20 MG/ML) 5 ML SYRINGE
INTRAMUSCULAR | Status: DC | PRN
  Administered 2018-09-21: 80 mg via INTRAVENOUS

## 2018-09-21 MED ORDER — LIDOCAINE 2% (20 MG/ML) 5 ML SYRINGE
INTRAMUSCULAR | Status: AC
  Filled 2018-09-21: qty 5

## 2018-09-21 MED ORDER — MORPHINE SULFATE (PF) 2 MG/ML IV SOLN
2.0000 mg | INTRAVENOUS | Status: DC | PRN
  Administered 2018-09-21 (×2): 2 mg via INTRAVENOUS
  Filled 2018-09-21 (×2): qty 1

## 2018-09-21 MED ORDER — TRAMADOL HCL 50 MG PO TABS
50.0000 mg | ORAL_TABLET | Freq: Four times a day (QID) | ORAL | Status: DC | PRN
  Administered 2018-09-21: 50 mg via ORAL
  Filled 2018-09-21: qty 1

## 2018-09-21 MED ORDER — GABAPENTIN 300 MG PO CAPS
300.0000 mg | ORAL_CAPSULE | Freq: Once | ORAL | Status: AC
  Administered 2018-09-21: 300 mg via ORAL
  Filled 2018-09-21: qty 1

## 2018-09-21 MED ORDER — BUPIVACAINE-EPINEPHRINE 0.5% -1:200000 IJ SOLN
INTRAMUSCULAR | Status: DC | PRN
  Administered 2018-09-21: 25 mL

## 2018-09-21 MED ORDER — SUGAMMADEX SODIUM 500 MG/5ML IV SOLN
INTRAVENOUS | Status: DC | PRN
  Administered 2018-09-21: 250 mg via INTRAVENOUS

## 2018-09-21 MED ORDER — MENTHOL 3 MG MT LOZG
1.0000 | LOZENGE | OROMUCOSAL | Status: DC | PRN
  Filled 2018-09-21: qty 9

## 2018-09-21 MED ORDER — SULFAMETHOXAZOLE-TRIMETHOPRIM 800-160 MG PO TABS: 1.0000 | ORAL_TABLET | Freq: Two times a day (BID) | ORAL | 0 refills | Status: DC

## 2018-09-21 MED ORDER — PROPOFOL 10 MG/ML IV BOLUS
INTRAVENOUS | Status: DC | PRN
  Administered 2018-09-21: 160 mg via INTRAVENOUS

## 2018-09-21 MED ORDER — TRAMADOL HCL 50 MG PO TABS
50.0000 mg | ORAL_TABLET | Freq: Four times a day (QID) | ORAL | Status: DC | PRN
  Administered 2018-09-22: 50 mg via ORAL
  Filled 2018-09-21: qty 1

## 2018-09-21 MED ORDER — BUPIVACAINE-EPINEPHRINE (PF) 0.5% -1:200000 IJ SOLN
INTRAMUSCULAR | Status: AC
  Filled 2018-09-21: qty 30

## 2018-09-21 MED ORDER — LABETALOL HCL 5 MG/ML IV SOLN
INTRAVENOUS | Status: DC | PRN
  Administered 2018-09-21: 5 mg via INTRAVENOUS

## 2018-09-21 MED ORDER — CEFAZOLIN SODIUM-DEXTROSE 2-4 GM/100ML-% IV SOLN
INTRAVENOUS | Status: AC
  Filled 2018-09-21: qty 100

## 2018-09-21 MED ORDER — HYDROMORPHONE HCL 2 MG/ML IJ SOLN
INTRAMUSCULAR | Status: AC
  Filled 2018-09-21: qty 1

## 2018-09-21 MED ORDER — SODIUM CHLORIDE 0.9 % IJ SOLN
INTRAMUSCULAR | Status: DC | PRN
  Administered 2018-09-21: 20 mL

## 2018-09-21 MED ORDER — DIPHENHYDRAMINE HCL 50 MG/ML IJ SOLN
INTRAMUSCULAR | Status: AC
  Filled 2018-09-21: qty 1

## 2018-09-21 MED ORDER — KETOROLAC TROMETHAMINE 15 MG/ML IJ SOLN
INTRAMUSCULAR | Status: AC
  Filled 2018-09-21: qty 1

## 2018-09-21 MED ORDER — ROCURONIUM BROMIDE 10 MG/ML (PF) SYRINGE
PREFILLED_SYRINGE | INTRAVENOUS | Status: DC | PRN
  Administered 2018-09-21 (×2): 20 mg via INTRAVENOUS
  Administered 2018-09-21: 60 mg via INTRAVENOUS
  Administered 2018-09-21 (×2): 10 mg via INTRAVENOUS
  Administered 2018-09-21 (×2): 20 mg via INTRAVENOUS

## 2018-09-21 MED ORDER — HYDROMORPHONE HCL 1 MG/ML IJ SOLN
INTRAMUSCULAR | Status: DC | PRN
  Administered 2018-09-21 (×2): 0.5 mg via INTRAVENOUS
  Administered 2018-09-21: .4 mg via INTRAVENOUS
  Administered 2018-09-21: .6 mg via INTRAVENOUS

## 2018-09-21 MED ORDER — ONDANSETRON HCL 4 MG/2ML IJ SOLN: 4.0000 mg | INTRAMUSCULAR | Status: DC | PRN

## 2018-09-21 MED ORDER — DEXAMETHASONE SODIUM PHOSPHATE 10 MG/ML IJ SOLN
INTRAMUSCULAR | Status: AC
  Filled 2018-09-21: qty 2

## 2018-09-21 MED ORDER — PROMETHAZINE HCL 25 MG/ML IJ SOLN: 6.2500 mg | INTRAMUSCULAR | Status: DC | PRN

## 2018-09-21 MED ORDER — LABETALOL HCL 5 MG/ML IV SOLN
10.0000 mg | INTRAVENOUS | Status: DC | PRN
  Administered 2018-09-21: 10 mg via INTRAVENOUS

## 2018-09-21 MED ORDER — PROPOFOL 10 MG/ML IV BOLUS
INTRAVENOUS | Status: AC
  Filled 2018-09-21: qty 40

## 2018-09-21 MED ORDER — GLYCOPYRROLATE PF 0.2 MG/ML IJ SOSY
PREFILLED_SYRINGE | INTRAMUSCULAR | Status: DC | PRN
  Administered 2018-09-21: .2 mg via INTRAVENOUS

## 2018-09-21 MED ORDER — ROCURONIUM BROMIDE 10 MG/ML (PF) SYRINGE
PREFILLED_SYRINGE | INTRAVENOUS | Status: AC
  Filled 2018-09-21: qty 10

## 2018-09-21 MED ORDER — SUGAMMADEX SODIUM 500 MG/5ML IV SOLN
INTRAVENOUS | Status: AC
  Filled 2018-09-21: qty 10

## 2018-09-21 MED ORDER — SODIUM CHLORIDE 0.9 % IV SOLN
INTRAVENOUS | Status: DC
  Administered 2018-09-21 – 2018-09-22 (×4): via INTRAVENOUS

## 2018-09-21 MED ORDER — HYDRALAZINE HCL 20 MG/ML IJ SOLN
5.0000 mg | INTRAMUSCULAR | Status: DC | PRN
  Administered 2018-09-21: 5 mg via INTRAVENOUS
  Filled 2018-09-21: qty 1

## 2018-09-21 MED ORDER — ONDANSETRON HCL 4 MG/2ML IJ SOLN
INTRAMUSCULAR | Status: AC
  Filled 2018-09-21: qty 2

## 2018-09-21 MED ORDER — ALBUMIN HUMAN 5 % IV SOLN
INTRAVENOUS | Status: DC | PRN
  Administered 2018-09-21: 11:00:00 via INTRAVENOUS

## 2018-09-21 MED ORDER — GLYCOPYRROLATE PF 0.2 MG/ML IJ SOSY
PREFILLED_SYRINGE | INTRAMUSCULAR | Status: AC
  Filled 2018-09-21: qty 1

## 2018-09-21 MED ORDER — LACTATED RINGERS IV SOLN
INTRAVENOUS | Status: DC | PRN
  Administered 2018-09-21 (×2): via INTRAVENOUS

## 2018-09-21 MED ORDER — CEFAZOLIN SODIUM-DEXTROSE 2-4 GM/100ML-% IV SOLN
2.0000 g | INTRAVENOUS | Status: AC
  Administered 2018-09-21 (×2): 2 g via INTRAVENOUS
  Filled 2018-09-21: qty 100

## 2018-09-21 MED ORDER — MIDAZOLAM HCL 2 MG/2ML IJ SOLN
INTRAMUSCULAR | Status: AC
  Filled 2018-09-21: qty 2

## 2018-09-21 MED ORDER — SUGAMMADEX SODIUM 500 MG/5ML IV SOLN
INTRAVENOUS | Status: AC
  Filled 2018-09-21: qty 5

## 2018-09-21 MED ORDER — LACTATED RINGERS IV SOLN
INTRAVENOUS | Status: DC
  Administered 2018-09-21: 07:00:00 via INTRAVENOUS

## 2018-09-21 MED ORDER — DEXAMETHASONE SODIUM PHOSPHATE 10 MG/ML IJ SOLN
INTRAMUSCULAR | Status: DC | PRN
  Administered 2018-09-21: 10 mg via INTRAVENOUS

## 2018-09-21 MED ORDER — ACETAMINOPHEN 10 MG/ML IV SOLN
1000.0000 mg | Freq: Four times a day (QID) | INTRAVENOUS | Status: AC
  Administered 2018-09-21 – 2018-09-22 (×4): 1000 mg via INTRAVENOUS
  Filled 2018-09-21 (×4): qty 100

## 2018-09-21 MED ORDER — ACETAMINOPHEN 500 MG PO TABS
1000.0000 mg | ORAL_TABLET | Freq: Once | ORAL | Status: AC
  Administered 2018-09-21: 1000 mg via ORAL
  Filled 2018-09-21: qty 2

## 2018-09-21 MED ORDER — FENTANYL CITRATE (PF) 100 MCG/2ML IJ SOLN
25.0000 ug | INTRAMUSCULAR | Status: DC | PRN
  Administered 2018-09-21 (×3): 50 ug via INTRAVENOUS

## 2018-09-21 MED ORDER — TRAMADOL HCL 50 MG PO TABS: 50.0000 mg | ORAL_TABLET | Freq: Four times a day (QID) | ORAL | 0 refills | Status: DC | PRN

## 2018-09-21 MED ORDER — SODIUM CHLORIDE 0.9 % IJ SOLN
INTRAMUSCULAR | Status: AC
  Filled 2018-09-21: qty 10

## 2018-09-21 MED ORDER — BUPIVACAINE LIPOSOME 1.3 % IJ SUSP
20.0000 mL | Freq: Once | INTRAMUSCULAR | Status: AC
  Administered 2018-09-21: 20 mL
  Filled 2018-09-21: qty 20

## 2018-09-21 MED ORDER — DEXAMETHASONE SODIUM PHOSPHATE 10 MG/ML IJ SOLN
INTRAMUSCULAR | Status: AC
  Filled 2018-09-21: qty 1

## 2018-09-21 MED ORDER — SODIUM CHLORIDE 0.9 % IJ SOLN
INTRAMUSCULAR | Status: AC
  Filled 2018-09-21: qty 20

## 2018-09-21 MED ORDER — DIPHENHYDRAMINE HCL 50 MG/ML IJ SOLN
INTRAMUSCULAR | Status: DC | PRN
  Administered 2018-09-21: 12.5 mg via INTRAVENOUS

## 2018-09-21 MED ORDER — ONDANSETRON HCL 4 MG/2ML IJ SOLN
INTRAMUSCULAR | Status: DC | PRN
  Administered 2018-09-21: 4 mg via INTRAVENOUS

## 2018-09-21 MED ORDER — KETOROLAC TROMETHAMINE 15 MG/ML IJ SOLN
15.0000 mg | Freq: Four times a day (QID) | INTRAMUSCULAR | Status: DC
  Administered 2018-09-21 – 2018-09-22 (×5): 15 mg via INTRAVENOUS
  Filled 2018-09-21 (×4): qty 1

## 2018-09-21 MED ORDER — FENTANYL CITRATE (PF) 100 MCG/2ML IJ SOLN
INTRAMUSCULAR | Status: DC | PRN
  Administered 2018-09-21: 100 ug via INTRAVENOUS
  Administered 2018-09-21 (×3): 50 ug via INTRAVENOUS

## 2018-09-21 MED ORDER — FENTANYL CITRATE (PF) 250 MCG/5ML IJ SOLN
INTRAMUSCULAR | Status: AC
  Filled 2018-09-21: qty 5

## 2018-09-21 MED ORDER — LIP MEDEX EX OINT
TOPICAL_OINTMENT | CUTANEOUS | Status: AC
  Administered 2018-09-21: 18:00:00
  Filled 2018-09-21: qty 7

## 2018-09-21 SURGICAL SUPPLY — 60 items
ADH SKN CLS APL DERMABOND .7 (GAUZE/BANDAGES/DRESSINGS) ×2
CATH FOLEY 2WAY SLVR 18FR 30CC (CATHETERS) ×8 IMPLANT
CATH TIEMANN FOLEY 18FR 5CC (CATHETERS) ×4 IMPLANT
CHLORAPREP W/TINT 26ML (MISCELLANEOUS) ×4 IMPLANT
CLIP VESOLOCK LG 6/CT PURPLE (CLIP) ×8 IMPLANT
COVER SURGICAL LIGHT HANDLE (MISCELLANEOUS) ×4 IMPLANT
COVER TIP SHEARS 8 DVNC (MISCELLANEOUS) ×4 IMPLANT
COVER TIP SHEARS 8MM DA VINCI (MISCELLANEOUS) ×4
CUTTER ECHEON FLEX ENDO 45 340 (ENDOMECHANICALS) ×4 IMPLANT
DECANTER SPIKE VIAL GLASS SM (MISCELLANEOUS) ×4 IMPLANT
DERMABOND ADVANCED (GAUZE/BANDAGES/DRESSINGS) ×2
DERMABOND ADVANCED .7 DNX12 (GAUZE/BANDAGES/DRESSINGS) ×2 IMPLANT
DRAPE ARM DVNC X/XI (DISPOSABLE) ×8 IMPLANT
DRAPE COLUMN DVNC XI (DISPOSABLE) ×2 IMPLANT
DRAPE DA VINCI XI ARM (DISPOSABLE) ×8
DRAPE DA VINCI XI COLUMN (DISPOSABLE) ×2
DRAPE SURG IRRIG POUCH 19X23 (DRAPES) ×4 IMPLANT
DRSG TEGADERM 4X4.75 (GAUZE/BANDAGES/DRESSINGS) ×7 IMPLANT
DRSG TEGADERM 8X12 (GAUZE/BANDAGES/DRESSINGS) ×4 IMPLANT
ELECT PENCIL ROCKER SW 15FT (MISCELLANEOUS) ×3 IMPLANT
ELECT REM PT RETURN 15FT ADLT (MISCELLANEOUS) ×4 IMPLANT
GAUZE SPONGE 2X2 8PLY STRL LF (GAUZE/BANDAGES/DRESSINGS) IMPLANT
GLOVE BIO SURGEON STRL SZ 6.5 (GLOVE) IMPLANT
GLOVE BIO SURGEON STRL SZ7 (GLOVE) ×8 IMPLANT
GLOVE BIO SURGEONS STRL SZ 6.5 (GLOVE)
GLOVE BIOGEL M STRL SZ7.5 (GLOVE) ×11 IMPLANT
GLOVE BIOGEL PI IND STRL 7.0 (GLOVE) ×4 IMPLANT
GLOVE BIOGEL PI INDICATOR 7.0 (GLOVE) ×4
GOWN STRL REUS W/TWL LRG LVL3 (GOWN DISPOSABLE) ×12 IMPLANT
GOWN STRL REUS W/TWL XL LVL3 (GOWN DISPOSABLE) ×8 IMPLANT
HOLDER FOLEY CATH W/STRAP (MISCELLANEOUS) ×4 IMPLANT
IRRIG SUCT STRYKERFLOW 2 WTIP (MISCELLANEOUS) ×4
IRRIGATION SUCT STRKRFLW 2 WTP (MISCELLANEOUS) ×2 IMPLANT
IV LACTATED RINGERS 1000ML (IV SOLUTION) IMPLANT
PACK ROBOT UROLOGY CUSTOM (CUSTOM PROCEDURE TRAY) ×4 IMPLANT
PAD POSITIONING PINK XL (MISCELLANEOUS) ×4 IMPLANT
PLUG CATH AND CAP STER (CATHETERS) ×3 IMPLANT
PORT ACCESS TROCAR AIRSEAL 12 (TROCAR) ×2 IMPLANT
PORT ACCESS TROCAR AIRSEAL 5M (TROCAR) ×2
SEAL CANN UNIV 5-8 DVNC XI (MISCELLANEOUS) ×8 IMPLANT
SEAL XI 5MM-8MM UNIVERSAL (MISCELLANEOUS) ×8
SET TRI-LUMEN FLTR TB AIRSEAL (TUBING) ×4 IMPLANT
SHEET LAVH (DRAPES) ×4 IMPLANT
SOLUTION ELECTROLUBE (MISCELLANEOUS) ×4 IMPLANT
SPONGE GAUZE 2X2 STER 10/PKG (GAUZE/BANDAGES/DRESSINGS)
STAPLE RELOAD 45 GRN (STAPLE) ×2 IMPLANT
STAPLE RELOAD 45MM GREEN (STAPLE) ×4
SUT ETHILON 3 0 PS 1 (SUTURE) ×4 IMPLANT
SUT MNCRL AB 4-0 PS2 18 (SUTURE) ×8 IMPLANT
SUT V-LOC BARB 180 2/0GR6 GS22 (SUTURE) ×4
SUT VIC AB 0 CT1 27 (SUTURE) ×4
SUT VIC AB 0 CT1 27XBRD ANTBC (SUTURE) ×2 IMPLANT
SUT VIC AB 2-0 SH 27 (SUTURE) ×4
SUT VIC AB 2-0 SH 27X BRD (SUTURE) ×2 IMPLANT
SUT VICRYL 0 UR6 27IN ABS (SUTURE) ×11 IMPLANT
SUT VLOC BARB 180 ABS3/0GR12 (SUTURE) ×8
SUTURE V-LC BRB 180 2/0GR6GS22 (SUTURE) ×2 IMPLANT
SUTURE VLOC BRB 180 ABS3/0GR12 (SUTURE) ×4 IMPLANT
TOWEL OR NON WOVEN STRL DISP B (DISPOSABLE) ×4 IMPLANT
WATER STERILE IRR 1000ML POUR (IV SOLUTION) ×4 IMPLANT

## 2018-09-21 NOTE — Anesthesia Procedure Notes (Signed)
Procedure Name: Intubation Date/Time: 09/21/2018 7:40 AM Performed by: Lavina Hamman, CRNA Pre-anesthesia Checklist: Patient identified, Emergency Drugs available, Suction available, Patient being monitored and Timeout performed Patient Re-evaluated:Patient Re-evaluated prior to induction Oxygen Delivery Method: Circle system utilized Preoxygenation: Pre-oxygenation with 100% oxygen Induction Type: IV induction Ventilation: Oral airway inserted - appropriate to patient size and Two handed mask ventilation required Laryngoscope Size: Mac and 4 Grade View: Grade II Tube type: Oral Tube size: 7.5 mm Number of attempts: 1 Airway Equipment and Method: Stylet Placement Confirmation: ETT inserted through vocal cords under direct vision,  positive ETCO2,  CO2 detector and breath sounds checked- equal and bilateral Secured at: 24 cm Tube secured with: Tape Dental Injury: Teeth and Oropharynx as per pre-operative assessment

## 2018-09-21 NOTE — Transfer of Care (Signed)
Immediate Anesthesia Transfer of Care Note  Patient: Juan Richards  Procedure(s) Performed: Procedure(s): XI ROBOTIC ASSISTED LAPAROSCOPIC RADICAL PROSTATECTOMY (N/A) BILATERAL PELVIC LYMPH NODE DISSECTION (Bilateral)  Patient Location: PACU  Anesthesia Type:General  Level of Consciousness:  sedated, patient cooperative and responds to stimulation  Airway & Oxygen Therapy:Patient Spontanous Breathing and Patient connected to face mask oxgen  Post-op Assessment:  Report given to PACU RN and Post -op Vital signs reviewed and stable  Post vital signs:  Reviewed and stable  Last Vitals:  Vitals:   09/21/18 0544  BP: (!) 138/95  Pulse: 62  Resp: 18  Temp: 36.7 C  SpO2: 159%    Complications: No apparent anesthesia complications

## 2018-09-21 NOTE — H&P (Signed)
Elevated PSA- Established Patient  HPI: Juan Richards is a 62 year-old male established patient who is here for follow up for further evaluation of his elevated PSA.  The patient was last seen July 2019.   His last PSA was performed 06/26/2018. The last PSA value was 69.97.   His previous PSA(s) are: 07/2016: 28. The patient states he does not take 5 alpha reductase inhibitor medication.   The patient states they currently do not have any significant urinary tract symptoms. He is not currently taking any medications for his lower urinary tract symptoms.   The patient has no erectile dysfunction or is currently not being treated for it.   The patient has not had a prostate MRI. He has had a prostate biopsy done. Last prostate biopsy was: approximately 08/07/2018. TRUS vol: 48. The patient does not have a family history of prostate cancer.   The patient was seen for elevated PSA in August 2017, we are scheduling him for prostate biopsy. He subsequently lost to follow-up secondary to spinal surgery.   The patient has no symptomatic urinary tract symptoms at this time. He denies any bone or back pain. He denies any night sweats or decreased energy.   The patient returns today for discussion of his newly diagnosed prostate cancer. The patient has recovered well from the prostate biopsy and denies any ongoing hematuria or dysuria. No fevers post procedure.   Prostate cancer profile  Stage: T1c  PSA: 69  Biopsy 08/06/18 , 9 /12 cores positive: 4+3 = 7 right lateral lobe in 2 cores. The whole right lobe had Gleason 7 cancer. There were 3 cores of prostate cancer in the patient's left lobe.  Prostate volume: 48 g, small median lobe  DRE: Asymmetric and firm throughout, nodular irregular lesion involving the majority of the patient's right lobe   Prostate cancer nomogram after radical prostatectomy:  OC- 2%  ECE- 98%  SVI-  LNI -67%  PFS (surgery)- 10 years: 98%, 15 years: 96%    The patient's  past medical history is largely unremarkable except for his spinal stenosis for which she had surgery for in November 2015     AUA Symptom Score: He never has the sensation of not emptying his bladder completely after finishing urinating. He never has to urinate again less that two hours after he has finished urinating. Less than 20% of the time he has to start and stop again several times when he urinates. He never finds it difficult to postpone urination. Less than 20% of the time he has a weak urinary stream. He never has to push or strain to begin urination. He never has to get up to urinate from the time he goes to bed until the time he gets up in the morning.   Calculated AUA Symptom Score: 2    IIEF-5 Score: The patient's confidence that he can get an erection is very low. The patient's erections were hard enough for penetration almost never or never. The patient was able to maintain his erection after he had penetrated his partner almost never or never. During sexual intercouse, it was extremely difficult to maintain his erection to the completion of intercourse. Sexual intercourse was never satisfactory for him.   Calculated IIEF-5 Symptom Score: 4    QOL Score: He would feel mostly satisfied if he had to live with his urinary condition the way it is now for the rest of his life.   Calculated QOL Symptom Score: 2  ALLERGIES: No Allergies    MEDICATIONS: No Medications    GU PSH: Prostate Needle Biopsy - 08/06/2018, 08/06/2018    NON-GU PSH: Back surgery - about 2017 Surgical Pathology, Gross And Microscopic Examination For Prostate Needle - 08/06/2018, 08/06/2018    GU PMH: Elevated PSA - 06/26/2018, - 2017    NON-GU PMH: No Non-GU PMH    FAMILY HISTORY: Cancer - Father Diabetes - Mother Hypertension - Mother Kidney Failure - Mother   SOCIAL HISTORY: Marital Status: Married Preferred Language: English; Ethnicity: Not Hispanic Or Latino; Race: Black or African  American Current Smoking Status: Patient has never smoked.  Light Drinker.  Drinks 1 caffeinated drink per day. 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     Notes: 1 daughter    REVIEW OF SYSTEMS:    GU Review Male:   Patient denies frequent urination, hard to postpone urination, burning/ pain with urination, get up at night to urinate, leakage of urine, stream starts and stops, trouble starting your stream, have to strain to urinate , erection problems, and penile pain.  Gastrointestinal (Upper):   Patient denies nausea, vomiting, and indigestion/ heartburn.  Gastrointestinal (Lower):   Patient denies diarrhea and constipation.  Constitutional:   Patient denies fever, night sweats, weight loss, and fatigue.  Skin:   Patient denies skin rash/ lesion and itching.  Eyes:   Patient denies blurred vision and double vision.  Ears/ Nose/ Throat:   Patient denies sore throat and sinus problems.  Hematologic/Lymphatic:   Patient denies swollen glands and easy bruising.  Cardiovascular:   Patient denies leg swelling and chest pains.  Respiratory:   Patient denies cough and shortness of breath.  Endocrine:   Patient denies excessive thirst.  Musculoskeletal:   Patient denies back pain and joint pain.  Neurological:   Patient denies headaches and dizziness.  Psychologic:   Patient denies depression and anxiety.   VITAL SIGNS: None   GU PHYSICAL EXAMINATION:    Prostate: The patient's prostate is enlarged, +2 in size, asymmetric and firm throughout, nodular irregular lesion involving the majority of the right lobe. There does not appear to be any extraprostatic extension.   MULTI-SYSTEM PHYSICAL EXAMINATION:       PAST DATA REVIEWED:  Source Of History:  Patient  Lab Test Review:   PSA  Records Review:   Pathology Reports, Previous Doctor Records, Previous Patient Records, POC Tool  X-Ray Review: C.T. Pelvis: Reviewed Films. Discussed With Patient. No evidence of lymphadenopathy or  extracapsular extension of his prostate cancer Bone Scan: Reviewed Films. Discussed With Patient. No evidence of metastatic disease    PROCEDURES: None   ASSESSMENT:      ICD-10 Details  1 GU:   Prostate Cancer - C61    PLAN:           Document Letter(s):  Created for Patient: Clinical Summary         Notes:   The patient denied discussed his prostate cancer diagnosis in significant detail. He has a Gleason score consistent with unfavorable intermediate risk disease, but his PSA is 2 which pushes and it is a high risk category. Fortunately his metastatic survey demonstrated no evidence of metastatic disease. His prostate exam was concerning, but did not seem to extend beyond his prostate.   Findings standard prostate cancer discussion with the patient including the natural progression of his pathology. I discussed his treatment options and in particular I discussed surgery and radiation. I went through both of these quite extensively with him  and his wife. After going through all the treatment options the patient is leaning toward surgery, but I recommended that he think about his options and follow-up with the multidisciplinary high-risk prostate cancer and conduit incision over the coming weeks. He'll contact us when he is ready to proceed.

## 2018-09-21 NOTE — Op Note (Signed)
Preoperative diagnosis:  1. Prostate Cancer   Postoperative diagnosis:  1. same   Procedure: 1. Robotic assisted laparoscopic radical prostatectomy 2. Bilateral pelvic lymph node dissection  Surgeon: Ardis Hughs, MD First Assistant: Basilio Cairo, MD  Anesthesia: General  Complications: None  Intraoperative findings: grossly negative margins, non-nerve sparing, extended lymph node dissection.  EBL: Minimal  Specimens:  #1.  Prostate and seminal vesicals #2.  Bilateral pelvic lymph nodes  Indication: Juan Richards is a 62 y.o. patient with prostate cancer.  After reviewing the management options for treatment, he elected to proceed with the removal of his prostate. We have discussed the potential benefits and risks of the procedure, side effects of the proposed treatment, the likelihood of the patient achieving the goals of the procedure, and any potential problems that might occur during the procedure or recuperation. Informed consent has been obtained.  Description of procedure:  The patient was consented in the preoperative holding area. He was in brought back to the operating room placed the table in supine position. General anesthesia was then induced and endotracheal tube was inserted. He was then placed in dorsolithotomy position and placed in steep Trendelenburg. He was then prepped and draped in the routine sterile fashion. We, the first assistant and I, then began by making a 10 mm incision supraumbilical midline incision the skin down through into the peritoneum. Then placed a 8 mm trocar. I then inflated the abdomen and inserted the 0 robotic lens. We then placed 2 additional a 8 millimeter trochars in the patient's left lower abdomen proximally 9 cm apart and 2 trochars on the patient's right lower abdomen, one was an 8 mm trocar and the one most lateral was a 12 mm trocar which was used as the assistant port. A 5 mm trocar was placed by triangulating the 2  right lateral ports as a second assistant port. These ports were all placed under visual guidance. Once the ports were noted to be satisfactory position the robot was docked. We started with the 0 lens, monopolar scissors in the right hand and the [place] forceps the left hand as well as a fenestrated grasper as the third arm on the left-hand side.   We, the first assistant and I,  began our dissection the posterior plane incising the peritoneum at the level of the vas deferens. Isolated the left vas deferens and dissected it proximally towards the spermatic cord for 5 cm prior to ligating it. Then used this as traction to isolate the left the seminal vesicle which was then undressed bluntly and completely dissected out, all vessels were cauterized with a combination of bipolar and the monopolar scissors. We then turned our attention to the right side and similarly dissected out the right vas deferens and seminal vesicle. Once the SVs had been freed, we turned our attention to the posterior plane and bluntly dissected the tissue between the rectum and the posterior wall of the prostate bluntly out towards the apex.    At this point the bladder was taken down starting at the urachal remnant with a combination of both blunt dissection and sharp dissection using monopolar cautery the bladder was dropped down in the usual fashion to the medial umbilical ligaments laterally and the dorsal vein of the prostate anteriorly creating our space of Retzius. We then turned our attention to the endopelvic fascia which was incised laterally starting on the patient's right-hand side the levator muscles were pushed off the prostate laterally up towards the dorsal  vein complex on the right-hand side. This process was then repeated on the left-hand side and a nice notch was created for the dorsal vein. I then used a 51mm stapler to staple the dorsal vein.   We,the first assistant and I, then located the bladder neck at the  vesicoprostatic junction and using the monopolar scissors dissected down through the perivesical tissues and the bladder neck down to the prostatic urethra. The catheter was then deflated and pulled through our urethral opening and then used to retract the prostate anteriorly for the posterior bladder neck dissection. Once through the bladder neck and into the posterior plane of the prostate, the SVs were brought through the opening. The left pedicle was then isolated and systematically ligated with Weck clips and scissors. The nerve bundle was included in the specimen.  I then came down through the dorsal venous complex anteriorly down to the membranous urethra using the monopolar. Once down to the urethra, it was transected sharply and the apex of the prostate was then dissected off the levator and rectourethralis muscles. Once the apex of the prostate had been dissected free we came back to the base of the prostate and bluntly push the rectum and nerve vascular bundle off the prostate the patient's left and used clips on the patient's right to free the prostate. Once the prostate was free it was placed off to the side. The pelvis was then irrigated with normal saline and noted to be relatively hemostatic.  Attention was then turned to the right pelvic sidewall. The fibrofatty tissue between the external iliac vein, confluence of the iliac vessels, hypogastric artery, and Cooper's ligament was dissected free from the pelvic sidewall with care to preserve the obturator nerve. Weck clips were used for lymphostasis and hemostasis. An identical procedure was performed on the contralateral side and the lymphatic packets were removed for permanent pathologic analysis.  The prostate and both lymph node tissues were placed in the Endo Catch bag and the string brought to the 5 mm port.    The vesicourethral anastomosis was then completed with 2 interlocking 3-0 V. lock sutures running the anastomosis in the 6:00  position to the 12:00 position on each side and then tying it off on the top. The final catheter was then passed through the patient's urethra and into the bladder and 120 cc was instilled into the bladder to test the anastomosis. As there was no leak a 11 [place] Blake drain was passed through the left lateral port and placed around the vesicourethral anastomosis. A 12 mm assistant port on the right lateral side was then closed with 0 Vicryl with the help of the [employer] needle. The 12 mm midline infraumbilical incision was then extended another centimeter taken down and the fascia opened to remove the Endo Catch bag with the prostate specimen. The fascia was then closed with a 0 Vicryl and all skin ports were closed with 4-0 Monocryl in a subcutaneous fashion. Dermabond glue was then applied to the incisions. The drain was then secured to the skin with a 0 nylon stitch and dressing applied.   At the end of the case all laps needles and sponges had been accounted for. There no immediate complications. The patient returned to the PACU in stable condition.

## 2018-09-21 NOTE — Anesthesia Preprocedure Evaluation (Addendum)
Anesthesia Evaluation  Patient identified by MRN, date of birth, ID band Patient awake    Reviewed: Allergy & Precautions, NPO status , Patient's Chart, lab work & pertinent test results  Airway Mallampati: III  TM Distance: >3 FB Neck ROM: Full    Dental  (+) Teeth Intact, Dental Advisory Given   Pulmonary neg pulmonary ROS,    Pulmonary exam normal breath sounds clear to auscultation       Cardiovascular negative cardio ROS Normal cardiovascular exam Rhythm:Regular Rate:Normal     Neuro/Psych negative neurological ROS     GI/Hepatic negative GI ROS, Neg liver ROS,   Endo/Other  Obesity   Renal/GU negative Renal ROS   Prostate cancer     Musculoskeletal negative musculoskeletal ROS (+)   Abdominal   Peds  Hematology  (+) Blood dyscrasia, anemia ,   Anesthesia Other Findings Day of surgery medications reviewed with the patient.  Reproductive/Obstetrics                            Anesthesia Physical Anesthesia Plan  ASA: II  Anesthesia Plan: General   Post-op Pain Management:    Induction: Intravenous  PONV Risk Score and Plan: 4 or greater and Midazolam, Dexamethasone, Ondansetron and Diphenhydramine  Airway Management Planned: Oral ETT  Additional Equipment:   Intra-op Plan:   Post-operative Plan: Extubation in OR  Informed Consent: I have reviewed the patients History and Physical, chart, labs and discussed the procedure including the risks, benefits and alternatives for the proposed anesthesia with the patient or authorized representative who has indicated his/her understanding and acceptance.   Dental advisory given  Plan Discussed with: CRNA  Anesthesia Plan Comments: (2nd IV after induction.)        Anesthesia Quick Evaluation

## 2018-09-21 NOTE — Discharge Instructions (Signed)
Activity:  You are encouraged to ambulate frequently (about every hour during waking hours) to help prevent blood clots from forming in your legs or lungs.  However, you should not engage in any heavy lifting (> 10-15 lbs), strenuous activity, or straining.  Diet: You should advance your diet as instructed by your physician.  It will be normal to have some bloating, nausea, and abdominal discomfort intermittently.  Prescriptions:  You will be provided a prescription for pain medication to take as needed.  If your pain is not severe enough to require the prescription pain medication, you may take extra strength Tylenol instead which will have less side effects.  You should also take a prescribed stool softener to avoid straining with bowel movements as the prescription pain medication may constipate you.  Incisions: You may remove your dressing bandages 48 hours after surgery if not removed in the hospital.  You will either have some small staples or special tissue glue at each of the incision sites. Once the bandages are removed (if present), the incisions may stay open to air.  You may start showering (but not soaking or bathing in water) the 2nd day after surgery and the incisions simply need to be patted dry after the shower.  No additional care is needed.  What to call us about: You should call the office ([phone]) if you develop fever > 101 or develop persistent vomiting. Activity:  You are encouraged to ambulate frequently (about every hour during waking hours) to help prevent blood clots from forming in your legs or lungs.  However, you should not engage in any heavy lifting (> 10-15 lbs), strenuous activity, or straining.    Foley Catheter Care A soft, flexible tube (Foley catheter) may have been placed in your bladder to drain urine and fluid. Follow these instructions: Taking Care of the Catheter Keep the area where the catheter leaves your body clean.  Attach the catheter to the leg so  there is no tension on the catheter.  Keep the drainage bag below the level of the bladder, but keep it OFF the floor.  Do not take long soaking baths. Your caregiver will give instructions about showering.  Wash your hands before touching ANYTHING related to the catheter or bag.  Using mild soap and warm water on a washcloth:  Clean the area closest to the catheter insertion site using a circular motion around the catheter.  Clean the catheter itself by wiping AWAY from the insertion site for several inches down the tube.  NEVER wipe upward as this could sweep bacteria up into the urethra (tube in your body that normally drains the bladder) and cause infection.  Place a small amount of sterile lubricant at the tip of the penis where the catheter is entering.  Taking Care of the Drainage Bags Two drainage bags may be taken home: a large overnight drainage bag, and a smaller leg bag which fits underneath clothing.  It is okay to wear the overnight bag at any time, but NEVER wear the smaller leg bag at night.  Keep the drainage bag well below the level of your bladder. This prevents backflow of urine into the bladder and allows the urine to drain freely.  Anchor the tubing to your leg to prevent pulling or tension on the catheter. Use tape or a leg strap provided by the hospital.  Empty the drainage bag when it is 1/2 to 3/4 full. Wash your hands before and after touching the bag.  Periodically   check the tubing for kinks to make sure there is no pressure on the tubing which could restrict the flow of urine.  Changing the Drainage Bags Cleanse both ends of the clean bag with alcohol before changing.  Pinch off the rubber catheter to avoid urine spillage during the disconnection.  Disconnect the dirty bag and connect the clean one.  Empty the dirty bag carefully to avoid a urine spill.  Attach the new bag to the leg with tape or a leg strap.  Cleaning the Drainage Bags Whenever a drainage bag is  disconnected, it must be cleaned quickly so it is ready for the next use.  Wash the bag in warm, soapy water.  Rinse the bag thoroughly with warm water.  Soak the bag for 30 minutes in a solution of white vinegar and water (1 cup vinegar to 1 quart warm water).  Rinse with warm water.  SEEK MEDICAL CARE IF:  You have chills or night sweats.  You are leaking around your catheter or have problems with your catheter. It is not uncommon to have sporadic leakage around your catheter as a result of bladder spasms. If the leakage stops, there is not much need for concern. If you are uncertain, call your caregiver.  You develop side effects that you think are coming from your medicines.  SEEK IMMEDIATE MEDICAL CARE IF:  You are suddenly unable to urinate. Check to see if there are any kinks in the drainage tubing that may cause this. If you cannot find any kinks, call your caregiver immediately. This is an emergency.  You develop shortness of breath or chest pains.  Bleeding persists or clots develop in your urine.  You have a fever.  You develop pain in your back or over your lower belly (abdomen).  You develop pain or swelling in your legs.  Any problems you are having get worse rather than better.  MAKE SURE YOU:  Understand these instructions.  Will watch your condition.  Will get help right away if you are not doing well or get worse.   

## 2018-09-21 NOTE — Progress Notes (Signed)
Patient stated that he felt like he had something in his throat. This RN and another RN used a tongue depressor and a flashlight to assess the patient's oral cavity. There was nothing visible and RN assured the patient that nothing had been seen. Will continue to monitor the patient.

## 2018-09-22 DIAGNOSIS — C61 Malignant neoplasm of prostate: Secondary | ICD-10-CM | POA: Diagnosis not present

## 2018-09-22 LAB — BASIC METABOLIC PANEL
ANION GAP: 7 (ref 5–15)
BUN: 12 mg/dL (ref 8–23)
CHLORIDE: 105 mmol/L (ref 98–111)
CO2: 26 mmol/L (ref 22–32)
Calcium: 8.2 mg/dL — ABNORMAL LOW (ref 8.9–10.3)
Creatinine, Ser: 0.89 mg/dL (ref 0.61–1.24)
GFR calc non Af Amer: >60 mL/min (ref >60)
Glucose, Bld: 103 mg/dL — ABNORMAL HIGH (ref 70–99)
Potassium: 3.7 mmol/L (ref 3.5–5.1)
Sodium: 138 mmol/L (ref 135–145)

## 2018-09-22 LAB — CBC
HCT: 32.1 % — ABNORMAL LOW (ref 39.0–52.0)
HEMOGLOBIN: 10.5 g/dL — AB (ref 13.0–17.0)
MCH: 30.1 pg (ref 26.0–34.0)
MCHC: 32.7 g/dL (ref 30.0–36.0)
MCV: 92 fL (ref 78.0–100.0)
Platelets: 224 10*3/uL (ref 150–400)
RBC: 3.49 MIL/uL — AB (ref 4.22–5.81)
RDW: 14 % (ref 11.5–15.5)
WBC: 9.2 10*3/uL (ref 4.0–10.5)

## 2018-09-22 NOTE — Discharge Summary (Signed)
[facility] Discharge Summary  Admit date: 09/21/2018  Discharge date and time: 09/22/18   Discharge to: Home  Discharge Service: Urology  Discharge Attending Physician:  Dr. Tresa Moore  Discharge  Diagnoses: Prostate cancer   Secondary Diagnosis: Active Problems:   Prostate cancer [facility])   OR Procedures: Procedure(s): XI ROBOTIC ASSISTED LAPAROSCOPIC RADICAL PROSTATECTOMY BILATERAL PELVIC LYMPH NODE DISSECTION 09/21/2018   Ancillary Procedures: None   Discharge Day Services: The patient was seen and examined by the Urology team both in the morning and immediately prior to discharge.  Vital signs and laboratory values were stable and within normal limits.  The physical exam was benign and unchanged and all surgical wounds were examined.  Discharge instructions were explained and all questions answered.  Subjective  No acute events overnight. Pain Controlled. No fever or chills.  Objective Patient Vitals for the past 8 hrs:  BP Temp Temp src Pulse Resp SpO2  09/22/18 0540 126/70 98 F (36.7 C) Oral 63 16 100 %  09/22/18 0119 (!) 145/80 98.4 F (36.9 C) Oral 67 14 98 %   No intake/output data recorded.  General Appearance:        No acute distress Lungs:                       Normal work of breathing on room air Heart:                                Regular rate and rhythm Abdomen:                         Soft, appropriately tender to palpation, port incisions and midline extraction site clean dry and intact with surgical glue GU:        Foley catheter in place freely draining light yellow urine Extremities:                      Warm and well perfused   Hospital Course:  The patient underwent a Robotic radical prostatectomy + BPLND on 09/21/2018.  The patient tolerated the procedure well, was extubated in the OR, and afterwards was taken to the PACU for routine post-surgical care. When stable the patient was transferred to the floor.   The patient did well postoperatively.   The patient's diet was slowly advanced and at the time of discharge was tolerating a regular diet.  The patient was discharged home 1 Day Post-Op, at which point was tolerating a regular solid diet, was able to void spontaneously, have adequate pain control with P.O. pain medication, and could ambulate without difficulty. The patient will follow up with [place] for post op check.   Condition at Discharge: Improved  Discharge Medications:  Allergies as of 09/22/2018   No Known Allergies     Medication List    TAKE these medications   sulfamethoxazole-trimethoprim 800-160 MG tablet Commonly known as:  BACTRIM DS,SEPTRA DS Take 1 tablet by mouth 2 (two) times daily. Start taking one day prior to your appointment for your first follow-up and catheter removal.  Continue taking for three days.   traMADol 50 MG tablet Commonly known as:  ULTRAM Take 1-2 tablets (50-100 mg total) by mouth every 6 (six) hours as needed for severe pain.

## 2018-09-24 ENCOUNTER — Encounter (HOSPITAL_COMMUNITY): Payer: Self-pay | Admitting: Urology

## 2018-09-24 ENCOUNTER — Encounter: Payer: Self-pay | Admitting: Medical Oncology

## 2018-09-24 NOTE — Anesthesia Postprocedure Evaluation (Signed)
Anesthesia Post Note  Patient: Juan Richards  Procedure(s) Performed: XI ROBOTIC ASSISTED LAPAROSCOPIC RADICAL PROSTATECTOMY (N/A ) BILATERAL PELVIC LYMPH NODE DISSECTION (Bilateral )     Patient location during evaluation: PACU Anesthesia Type: General Level of consciousness: awake and alert, awake and oriented Pain management: pain level controlled Vital Signs Assessment: post-procedure vital signs reviewed and stable Respiratory status: spontaneous breathing, nonlabored ventilation and respiratory function stable Cardiovascular status: blood pressure returned to baseline and stable Postop Assessment: no apparent nausea or vomiting Anesthetic complications: no    Last Vitals:  Vitals:   09/22/18 0119 09/22/18 0540  BP: (!) 145/80 126/70  Pulse: 67 63  Resp: 14 16  Temp: 36.9 C 36.7 C  SpO2: 98% 100%    Last Pain:  Vitals:   09/22/18 1619  TempSrc:   PainSc: [place]

## 2018-09-29 ENCOUNTER — Emergency Department (HOSPITAL_COMMUNITY)
Admission: EM | Admit: 2018-09-29 | Discharge: 2018-09-29 | Disposition: A | Discharge status: Home / Self Care | Payer: 59 | Attending: Emergency Medicine | Admitting: Emergency Medicine

## 2018-09-29 ENCOUNTER — Encounter (HOSPITAL_COMMUNITY): Payer: Self-pay | Admitting: *Deleted

## 2018-09-29 DIAGNOSIS — T83091A Other mechanical complication of indwelling urethral catheter, initial encounter: Secondary | ICD-10-CM | POA: Insufficient documentation

## 2018-09-29 DIAGNOSIS — Z5189 Encounter for other specified aftercare: Secondary | ICD-10-CM | POA: Diagnosis not present

## 2018-09-29 DIAGNOSIS — Z4800 Encounter for change or removal of nonsurgical wound dressing: Secondary | ICD-10-CM | POA: Diagnosis not present

## 2018-09-29 DIAGNOSIS — R39198 Other difficulties with micturition: Secondary | ICD-10-CM | POA: Diagnosis not present

## 2018-09-29 DIAGNOSIS — Z978 Presence of other specified devices: Secondary | ICD-10-CM

## 2018-09-29 DIAGNOSIS — Y731 Therapeutic (nonsurgical) and rehabilitative gastroenterology and urology devices associated with adverse incidents: Secondary | ICD-10-CM | POA: Insufficient documentation

## 2018-09-29 DIAGNOSIS — R339 Retention of urine, unspecified: Secondary | ICD-10-CM | POA: Diagnosis not present

## 2018-09-29 DIAGNOSIS — Z960 Presence of urogenital implants: Secondary | ICD-10-CM

## 2018-09-29 NOTE — Consult Note (Signed)
Urology Consult   Physician requesting consult: Dr. Gayla Medicus  Reason for consult: Drainage from port site  History of Present Illness: Juan Richards is a 62 y.o. s/p a robotic prostatectomy by [name] on 09/21/18.  He started developing serous drainage from his right lateral port site last night and it has continued today soaking multiple shirts.  His catheter was not draining well earlier but is now draining well and draining clear urine. He denies fever.    Past Medical History:  Diagnosis Date  . DIVERTICULITIS, COLON, WITH PERFORATION 07/23/2009  . Numbness and tingling    RIGHT TOES AND LEFT LEG DUE TO L 5 DISC   . PLEURISY YRS AGO  . Prostate cancer ([place])   . 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 BACK SURGERY  2017   L4 BACK SURGERY  . PELVIC LYMPH NODE DISSECTION Bilateral 09/21/2018   Procedure: BILATERAL PELVIC LYMPH NODE DISSECTION;  Surgeon: Ardis Hughs, MD;  Location: [facility];  Service: Urology;  Laterality: Bilateral;  . ROBOT ASSISTED LAPAROSCOPIC RADICAL PROSTATECTOMY N/A 09/21/2018   Procedure: XI ROBOTIC ASSISTED LAPAROSCOPIC RADICAL PROSTATECTOMY;  Surgeon: Ardis Hughs, MD;  Location: [facility];  Service: Urology;  Laterality: N/A;    Current Hospital Medications:  Home Meds:  No current facility-administered medications on file prior to encounter.    Current Outpatient Medications on File Prior to Encounter  Medication Sig Dispense Refill  . acetaminophen (TYLENOL) 500 MG tablet Take 500 mg by mouth every 6 (six) hours as needed for mild pain.    Marland Kitchen sulfamethoxazole-trimethoprim (BACTRIM DS,SEPTRA DS) 800-160 MG tablet Take 1 tablet by mouth 2 (two) times daily. Start taking one day prior to your appointment for your first follow-up and catheter removal.  Continue taking for three days. 6 tablet 0  . traMADol (ULTRAM) 50 MG tablet Take 1-2 tablets (50-100 mg total) by mouth every 6 (six) hours  as needed for severe pain. 30 tablet 0     Scheduled Meds: Continuous Infusions: PRN Meds:.  Allergies: No Known Allergies  Family History  Problem Relation Age of Onset  . Diabetes Mother   . Kidney disease Mother   . Cancer Father        ?  . Diabetes Maternal Grandmother     Social History:  reports that he has never smoked. He has never used smokeless tobacco. He reports that he drinks about 2.0 - 3.0 standard drinks of alcohol per week. He reports that he does not use drugs.  ROS: A complete review of systems was performed.  All systems are negative except for pertinent findings as noted.  Physical Exam:  Vital signs in last 24 hours: Temp:  [98.3 F (36.8 C)] 98.3 F (36.8 C) (10/12 1427) Pulse Rate:  [94] 94 (10/12 1427) BP: (141)/(76) 141/76 (10/12 1427) SpO2:  [100 %] 100 % (10/12 1427) Weight:  [106.6 kg] 106.6 kg (10/12 1427) Constitutional:  Alert and oriented, No acute distress Cardiovascular:No JVD Respiratory: Normal respiratory effort GI: Abdomen is soft, nontender, nondistended, no abdominal masses, his RLQ incision laterally has a small amount of serous fluid that can be expressed but the wound is closed.  No erythema or purulent drainage. GU: Foley draining well with grossly clear urine. Lymphatic: No lymphadenopathy Neurologic: Grossly intact, no focal deficits Psychiatric: Normal mood and affect  Laboratory Data:    Renal Function:  Radiologic Imaging: No results found.  I independently reviewed  the above imaging studies.  Impression/Recommendation Serous drainage from RLQ port site likely peritoneal fluid.  This is likely to resolve with conservative management.  Reinforced dressing and patient reassured.  He will continue with dressing changes prn and keep appt with [name] on Monday.  Juan Richards,LES 09/29/2018, 3:29 PM    Pryor Curia. MD   CC: Dr. Gayla Medicus

## 2018-09-29 NOTE — Discharge Instructions (Signed)
Switch your leg bag to large foley bag when you get home. Stay hydrated.   See [name] as scheduled on Monday   Return to ER if you have worse abdominal pain, fever, worse drainage from the incision sites, blood in foley, foley not draining

## 2018-09-29 NOTE — ED Triage Notes (Addendum)
Pt complains of clear drainage from surgical wound since this morning. Pt has foley catheter in place since prostate surgery but states it has not been draining. Pt has noted urine draining from his urethra around the catheter.

## 2018-09-29 NOTE — ED Provider Notes (Signed)
[facility] DEPT Provider Note   CSN: [id number] Arrival date & time: 09/29/18  1418     History   Chief Complaint Chief Complaint  Patient presents with  . Wound Check  . foley catheter not draining    HPI Juan Richards is a 62 y.o. male here presenting with Foley not draining and some drainage from the right lower quadrant incision site.  Patient had robotic prostatectomy on 10/4 by [name].  He had Foley placed and was doing well until this morning.  He states that his Foley stopped draining and he felt a lot of lower abdominal pressure.  Subsequently, patient noticed drainage from the right lower quadrant incision site.  He states that it was clear and he wasn't sure if it was urine.  He states that subsequently, his Foley started draining again.  Denies any fevers or chills or abdominal pain.  He called urology and came for evaluation.   The history is provided by the patient.    Past Medical History:  Diagnosis Date  . DIVERTICULITIS, COLON, WITH PERFORATION 07/23/2009  . Numbness and tingling    RIGHT TOES AND LEFT LEG DUE TO L 5 DISC   . PLEURISY YRS AGO  . Prostate cancer ([place])   . 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 Prostate cancer ([place]) 09/21/2018  . Malignant neoplasm of prostate ([place]) 09/11/2018  . Pre-diabetes 06/20/2018  . Routine general medical examination at a health care facility 08/11/2016  . DIVERTICULITIS, COLON, WITH PERFORATION 07/23/2009  . GROSS HEMATURIA 07/23/2009  . 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 EPIDIDYMAL CYST 08/31/2007    Past Surgical History:  Procedure Laterality Date  . BACK SURGERY  2017   L4 BACK SURGERY  . PELVIC LYMPH NODE DISSECTION Bilateral 09/21/2018   Procedure: BILATERAL PELVIC LYMPH NODE DISSECTION;  Surgeon: Ardis Hughs, MD;  Location: [facility];  Service: Urology;  Laterality: Bilateral;  . ROBOT ASSISTED LAPAROSCOPIC  RADICAL PROSTATECTOMY N/A 09/21/2018   Procedure: XI ROBOTIC ASSISTED LAPAROSCOPIC RADICAL PROSTATECTOMY;  Surgeon: Ardis Hughs, MD;  Location: [facility];  Service: Urology;  Laterality: N/A;        Home Medications    Prior to Admission medications   Medication Sig Start Date End Date Taking? Authorizing Provider  sulfamethoxazole-trimethoprim (BACTRIM DS,SEPTRA DS) 800-160 MG tablet Take 1 tablet by mouth 2 (two) times daily. Start taking one day prior to your appointment for your first follow-up and catheter removal.  Continue taking for three days. 09/21/18   Ardis Hughs, MD  traMADol (ULTRAM) 50 MG tablet Take 1-2 tablets (50-100 mg total) by mouth every 6 (six) hours as needed for severe pain. 09/21/18   Ardis Hughs, MD    Family History Family History  Problem Relation Age of Onset  . Diabetes Mother   . Kidney disease Mother   . Cancer Father        ?  . Diabetes Maternal Grandmother     Social History Social History   Tobacco Use  . Smoking status: Never Smoker  . Smokeless tobacco: Never Used  Substance Use Topics  . Alcohol use: Yes    Alcohol/week: 2.0 - 3.0 standard drinks    Types: 2 - 3 Cans of beer per week    Comment: OCC  . Drug use: No     Allergies   Patient has no known allergies.   Review  of Systems Review of Systems  Genitourinary: Positive for difficulty urinating.  Skin: Positive for wound.  All other systems reviewed and are negative.    Physical Exam Updated Vital Signs BP (!) 141/76 (BP Location: Right Arm)   Pulse 94   Temp 98.3 F (36.8 C) (Oral)   Ht 5\' 11"  (1.803 m)   Wt 106.6 kg   SpO2 100%   BMI 32.78 kg/m   Physical Exam  Constitutional: He appears well-developed.  HENT:  Head: Normocephalic.  Mouth/Throat: Oropharynx is clear and moist.  Eyes: Pupils are equal, round, and reactive to light. Conjunctivae and EOM are normal.  Neck: Normal range of motion.  Cardiovascular: Normal rate.    Pulmonary/Chest: Effort normal.  Abdominal: Soft.  Multiple post surgical incisions. There is serosanguinous drainage from RLQ incision site. Foley in place with clear urine, no obvious hematuria   Genitourinary: Penis normal.  Genitourinary Comments: Foley in place with clear urine, no gross hematuria   Musculoskeletal: Normal range of motion.  Neurological: He is alert.  Skin: Skin is warm.  Psychiatric: He has a normal mood and affect.  Nursing note and vitals reviewed.    ED Treatments / Results  Labs (all labs ordered are listed, but only abnormal results are displayed) Labs Reviewed  URINE CULTURE  CBC WITH DIFFERENTIAL/PLATELET  URINALYSIS, ROUTINE W REFLEX MICROSCOPIC  COMPREHENSIVE METABOLIC PANEL    EKG None  Radiology No results found.  Procedures Procedures (including critical care time)  Medications Ordered in ED Medications - No data to display   Initial Impression / Assessment and Plan / ED Course  I have reviewed the triage vital signs and the nursing notes.  Pertinent labs & imaging results that were available during my care of the patient were reviewed by me and considered in my medical decision making (see chart for details).    Juan Richards is a 62 y.o. male here with drainage from RLQ incision site. Initially foley is not draining but appears draining well at bedside. Bedside US showed no retention, difficult to locate foley balloon. He has serosanguinous drainage from RLQ incision site, not sure if it is urine vs peritoneal fluid. Has minimal lower abdominal tenderness. I called urology to see patient.   3:14 PM Dr. Alinda Money saw patient. Catheter draining well and he felt that patient can be discharged. I ordered labs, UA, CT ab/pel initially but Dr. Alinda Money felt that patient likely just had peritoneal fluid draining out of the RLQ incision drain. Told him to switch his leg bag to large foley bag at home. He has follow up with [name] in 2  days. Stable for discharge    Final Clinical Impressions(s) / ED Diagnoses   Final diagnoses:  None    ED Discharge Orders    None       Drenda Freeze, MD 09/29/18 1517

## 2018-10-12 DIAGNOSIS — R310 Gross hematuria: Secondary | ICD-10-CM | POA: Diagnosis not present

## 2018-10-12 DIAGNOSIS — M6281 Muscle weakness (generalized): Secondary | ICD-10-CM | POA: Diagnosis not present

## 2018-10-12 DIAGNOSIS — M62838 Other muscle spasm: Secondary | ICD-10-CM | POA: Diagnosis not present

## 2018-10-12 DIAGNOSIS — N393 Stress incontinence (female) (male): Secondary | ICD-10-CM | POA: Diagnosis not present

## 2018-10-16 DIAGNOSIS — M62838 Other muscle spasm: Secondary | ICD-10-CM | POA: Diagnosis not present

## 2018-10-16 DIAGNOSIS — M6281 Muscle weakness (generalized): Secondary | ICD-10-CM | POA: Diagnosis not present

## 2018-10-16 DIAGNOSIS — R1032 Left lower quadrant pain: Secondary | ICD-10-CM | POA: Diagnosis not present

## 2019-01-11 ENCOUNTER — Encounter: Payer: Self-pay | Admitting: Nurse Practitioner

## 2019-01-11 ENCOUNTER — Ambulatory Visit: Payer: 59 | Admitting: Nurse Practitioner

## 2019-01-11 VITALS — BP 118/78 | HR 84 | Temp 98.5°F | Ht 71.0 in | Wt 226.0 lb

## 2019-01-11 DIAGNOSIS — J069 Acute upper respiratory infection, unspecified: Secondary | ICD-10-CM | POA: Diagnosis not present

## 2019-01-11 MED ORDER — AZITHROMYCIN 250 MG PO TABS: ORAL_TABLET | ORAL | 0 refills | Status: DC

## 2019-01-11 NOTE — Patient Instructions (Addendum)
Begin antibiotic if symptoms persist for more than 1 week as we discussed.  Upper Respiratory Infection, Adult An upper respiratory infection (URI) affects the nose, throat, and upper air passages. URIs are caused by germs (viruses). The most common type of URI is often called "the common cold." Medicines cannot cure URIs, but you can do things at home to relieve your symptoms. URIs usually get better within 7-10 days. Follow these instructions at home: Activity  Rest as needed.  If you have a fever, stay home from work or school until your fever is gone, or until your doctor says you may return to work or school. ? You should stay home until you cannot spread the infection anymore (you are not contagious). ? Your doctor may have you wear a face mask so you have less risk of spreading the infection. Relieving symptoms  Gargle with a salt-water mixture 3-4 times a day or as needed. To make a salt-water mixture, completely dissolve -1 tsp of salt in 1 cup of warm water.  Use a cool-mist humidifier to add moisture to the air. This can help you breathe more easily. Eating and drinking   Drink enough fluid to keep your pee (urine) pale yellow.  Eat soups and other clear broths. General instructions   Take over-the-counter and prescription medicines only as told by your doctor. These include cold medicines, fever reducers, and cough suppressants.  Do not use any products that contain nicotine or tobacco. These include cigarettes and e-cigarettes. If you need help quitting, ask your doctor.  Avoid being where people are smoking (avoid secondhand smoke).  Make sure you get regular shots and get the flu shot every year.  Keep all follow-up visits as told by your doctor. This is important. How to avoid spreading infection to others   Wash your hands often with soap and water. If you do not have soap and water, use hand sanitizer.  Avoid touching your mouth, face, eyes, or  nose.  Cough or sneeze into a tissue or your sleeve or elbow. Do not cough or sneeze into your hand or into the air. Contact a doctor if:  You are getting worse, not better.  You have any of these: ? A fever. ? Chills. ? Brown or red mucus in your nose. ? Yellow or brown fluid (discharge)coming from your nose. ? Pain in your face, especially when you bend forward. ? Swollen neck glands. ? Pain with swallowing. ? White areas in the back of your throat. Get help right away if:  You have shortness of breath that gets worse.  You have very bad or constant: ? Headache. ? Ear pain. ? Pain in your forehead, behind your eyes, and over your cheekbones (sinus pain). ? Chest pain.  You have long-lasting (chronic) lung disease along with any of these: ? Wheezing. ? Long-lasting cough. ? Coughing up blood. ? A change in your usual mucus.  You have a stiff neck.  You have changes in your: ? Vision. ? Hearing. ? Thinking. ? Mood. Summary  An upper respiratory infection (URI) is caused by a germ called a virus. The most common type of URI is often called "the common cold."  URIs usually get better within 7-10 days.  Take over-the-counter and prescription medicines only as told by your doctor. This information is not intended to replace advice given to you by your health care provider. Make sure you discuss any questions you have with your health care provider. Document Released: 05/23/2008  Document Revised: 07/28/2017 Document Reviewed: 07/28/2017 Elsevier Interactive Patient Education  [employer].

## 2019-01-11 NOTE — Progress Notes (Signed)
Juan Richards is a 63 y.o. male with the following history as recorded in EpicCare:  Patient Active Problem List   Diagnosis Date Noted  . Prostate cancer ([place]) 09/21/2018  . Malignant neoplasm of prostate ([place]) 09/11/2018  . Pre-diabetes 06/20/2018  . Routine general medical examination at a health care facility 08/11/2016  . DIVERTICULITIS, COLON, WITH PERFORATION 07/23/2009  . GROSS HEMATURIA 07/23/2009  . 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 EPIDIDYMAL CYST 08/31/2007    Current Outpatient Medications  Medication Sig Dispense Refill  . azithromycin (ZITHROMAX) 250 MG tablet Take 2 tablets today then 1 tablet daily until complete. 6 tablet 0   No current facility-administered medications for this visit.     Allergies: Patient has no known allergies.  Past Medical History:  Diagnosis Date  . DIVERTICULITIS, COLON, WITH PERFORATION 07/23/2009  . Numbness and tingling    RIGHT TOES AND LEFT LEG DUE TO L 5 DISC   . PLEURISY YRS AGO  . Prostate cancer ([place])   . 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 BACK SURGERY  2017   L4 BACK SURGERY  . PELVIC LYMPH NODE DISSECTION Bilateral 09/21/2018   Procedure: BILATERAL PELVIC LYMPH NODE DISSECTION;  Surgeon: Ardis Hughs, MD;  Location[facility];  Service: Urology;  Laterality: Bilateral;  . ROBOT ASSISTED LAPAROSCOPIC RADICAL PROSTATECTOMY N/A 09/21/2018   Procedure: XI ROBOTIC ASSISTED LAPAROSCOPIC RADICAL PROSTATECTOMY;  Surgeon: Ardis Hughs, MD;  Location: [facility];  Service: Urology;  Laterality: N/A;    Family History  Problem Relation Age of Onset  . Diabetes Mother   . Kidney disease Mother   . Cancer Father        ?  . Diabetes Maternal Grandmother     Social History   Tobacco Use  . Smoking status: Never Smoker  . Smokeless tobacco: Never Used  Substance Use Topics  . Alcohol use: Yes    Alcohol/week: 2.0 - 3.0 standard drinks    Types: 2 - 3 Cans of  beer per week    Comment: OCC     Subjective:   [name] is here today requesting evaluation of acute complaint of cough/cold symptoms, which first began 4-5 days ago this past Monday with Fevers, chills, sinus pressure, nasal congestion, cough with green sputum. Denies: syncope, confusion, malaise, chest pain, shortness of breath, wheezing, abdominal pain, nausea,vomiting, diarrhea Smoker? no Tried at home: tylenol, soup, citrus fruits Improvement/worsening since onset? Actually feeling much better today, symptoms much better when he woke this morning but his wife made the appointment for him and wanted him to come in  ROS- See HPI  Objective:  Vitals:   01/11/19 1456  BP: 118/78  Pulse: 84  Temp: 98.5 F (36.9 C)  TempSrc: Oral  SpO2: 97%  Weight: 226 lb (102.5 kg)  Height: 5\' 11"  (1.803 m)    General: Well developed, well nourished, in no acute distress  Skin : Warm and dry.  Head: Normocephalic and atraumatic  Eyes: Sclera and conjunctiva clear; pupils round and reactive to light; extraocular movements intact  Ears: External normal; canals clear; tympanic membranes normal  Oropharynx: Pink, supple. No suspicious lesions  Neck: Supple Lungs: Respirations unlabored; clear to auscultation bilaterally without wheeze, rales, rhonchi  CVS exam: normal rate and regular rhythm.  Extremities: No edema, cyanosis, clubbing  Vessels: Symmetric bilaterally  Neurologic: Alert and oriented; speech intact; face symmetrical; moves all extremities well; CNII-XII intact without  focal deficit  Psychiatric: Normal mood and affect.  Assessment:  1. Upper respiratory tract infection, unspecified type     Plan:   VS, PE normal Symptoms are improving, suspect symptoms are viral at this point which we discussed Printed antibiotic prescription given with instructions to start antibiotic if symptoms persist for more than 1 week, he is agreeable/understanding of this plan Home management, red  flags and return precautions including when to seek immediate care discussed and printed on AVS  No follow-ups on file.  No orders of the defined types were placed in this encounter.   Requested Prescriptions   Signed Prescriptions Disp Refills  . azithromycin (ZITHROMAX) 250 MG tablet 6 tablet 0    Sig: Take 2 tablets today then 1 tablet daily until complete.

## 2019-01-14 ENCOUNTER — Telehealth: Payer: Self-pay | Admitting: Nurse Practitioner

## 2019-01-14 ENCOUNTER — Ambulatory Visit: Payer: Self-pay

## 2019-01-14 MED ORDER — BENZONATATE 100 MG PO CAPS: 100.0000 mg | ORAL_CAPSULE | Freq: Two times a day (BID) | ORAL | 0 refills | Status: DC | PRN

## 2019-01-14 NOTE — Telephone Encounter (Signed)
Copied from [place]. Topic: General - Other >> Jan 14, 2019  5:27 PM Yvette Rack wrote: Reason for CRM: Pt stated he spoke with the pharmacy and they do not have the Rx for benzonatate (TESSALON) 100 MG capsule. Pt requests that the Rx for benzonatate (TESSALON) 100 MG capsule be sent to [id number] [address], [place] [phone] (Phone)  [phone] (Fax)

## 2019-01-14 NOTE — Telephone Encounter (Signed)
Tessalon rx sent as needed for cough

## 2019-01-14 NOTE — Addendum Note (Signed)
Addended by: Lance Sell on: 01/14/2019 02:31 PM   Modules accepted: Orders

## 2019-01-14 NOTE — Telephone Encounter (Signed)
Pt wife called to say her husband need a cough medication called in to [facility] on [address].  She states that it is most of the time non productive dry.  It is mostly at night. Pt was see on Friday by Brien Few NP and is taking his antibiotic as prescribed. Pt's wife was made aware that it would most likely be the end of the day to get a response for this request. Reason for Disposition . Caller has URGENT medication question about med that PCP prescribed and triager unable to answer question  Answer Assessment - Initial Assessment Questions 1. SYMPTOMS: "Do you have any symptoms?"     Dry cough 2. SEVERITY: If symptoms are present, ask "Are they mild, moderate or severe?"     Moderate mostly dry mainly at night. Pt wife calling to request RX for cough robitussin DM is not working  Protocols used: MEDICATION QUESTION CALL-A-AH

## 2019-01-14 NOTE — Telephone Encounter (Signed)
Left detailed message informing pt of below.  

## 2019-01-15 MED ORDER — BENZONATATE 100 MG PO CAPS: 100.0000 mg | ORAL_CAPSULE | Freq: Two times a day (BID) | ORAL | 0 refills | Status: DC | PRN

## 2019-01-15 NOTE — Telephone Encounter (Signed)
Rx sent. See meds. Left detailed message informing pt.

## 2019-02-06 ENCOUNTER — Encounter: Payer: Self-pay | Admitting: Nurse Practitioner

## 2019-02-06 ENCOUNTER — Ambulatory Visit: Payer: 59 | Admitting: Nurse Practitioner

## 2019-02-06 VITALS — BP 112/84 | HR 76 | Temp 98.3°F | Ht 71.0 in | Wt 229.0 lb

## 2019-02-06 DIAGNOSIS — L03011 Cellulitis of right finger: Secondary | ICD-10-CM

## 2019-02-06 MED ORDER — SULFAMETHOXAZOLE-TRIMETHOPRIM 800-160 MG PO TABS: 1.0000 | ORAL_TABLET | Freq: Two times a day (BID) | ORAL | 0 refills | Status: DC

## 2019-02-06 NOTE — Patient Instructions (Signed)
Take bactrim as instructed  Please follow up for fevers over 101, if your symptoms get worse, or if your symptoms dont improve the antibiotic.   Cellulitis, Adult  Cellulitis is a skin infection. The infected area is often warm, red, swollen, and sore. It occurs most often in the arms and lower legs. It is very important to get treated for this condition. What are the causes? This condition is caused by bacteria. The bacteria enter through a break in the skin, such as a cut, burn, insect bite, open sore, or crack. What increases the risk? This condition is more likely to occur in people who:  Have a weak body defense system (immune system).  Have open cuts, burns, bites, or scrapes on the skin.  Are older than 63 years of age.  Have a blood sugar problem (diabetes).  Have a long-lasting (chronic) liver disease (cirrhosis) or kidney disease.  Are very overweight (obese).  Have a skin problem, such as: ? Itchy rash (eczema). ? Slow movement of blood in the veins (venous stasis). ? Fluid buildup below the skin (edema).  Have been treated with high-energy rays (radiation).  Use IV drugs. What are the signs or symptoms? Symptoms of this condition include:  Skin that is: ? Red. ? Streaking. ? Spotting. ? Swollen. ? Sore or painful when you touch it. ? Warm.  A fever.  Chills.  Blisters. How is this diagnosed? This condition is diagnosed based on:  Medical history.  Physical exam.  Blood tests.  Imaging tests. How is this treated? Treatment for this condition may include:  Medicines to treat infections or allergies.  Home care, such as: ? Rest. ? Placing cold or warm cloths (compresses) on the skin.  Hospital care, if the condition is very bad. Follow these instructions at home: Medicines  Take over-the-counter and prescription medicines only as told by your doctor.  If you were prescribed an antibiotic medicine, take it as told by your doctor. Do  not stop taking it even if you start to feel better. General instructions   Drink enough fluid to keep your pee (urine) pale yellow.  Do not touch or rub the infected area.  Raise (elevate) the infected area above the level of your heart while you are sitting or lying down.  Place cold or warm cloths on the area as told by your doctor.  Keep all follow-up visits as told by your doctor. This is important. Contact a doctor if:  You have a fever.  You do not start to get better after 1-2 days of treatment.  Your bone or joint under the infected area starts to hurt after the skin has healed.  Your infection comes back. This can happen in the same area or another area.  You have a swollen bump in the area.  You have new symptoms.  You feel ill and have muscle aches and pains. Get help right away if:  Your symptoms get worse.  You feel very sleepy.  You throw up (vomit) or have watery poop (diarrhea) for a long time.  You see red streaks coming from the area.  Your red area gets larger.  Your red area turns dark in color. These symptoms may represent a serious problem that is an emergency. Do not wait to see if the symptoms will go away. Get medical help right away. Call your local emergency services (911 in the [place] Do not drive yourself to the hospital. Summary  Cellulitis is a skin infection.  The area is often warm, red, swollen, and sore.  This condition is treated with medicines, rest, and cold and warm cloths.  Take all medicines only as told by your doctor.  Tell your doctor if symptoms do not start to get better after 1-2 days of treatment. This information is not intended to replace advice given to you by your health care provider. Make sure you discuss any questions you have with your health care provider. Document Released: 05/23/2008 Document Revised: 04/26/2018 Document Reviewed: 04/26/2018 Elsevier Interactive Patient Education  2019 [employer].

## 2019-02-06 NOTE — Progress Notes (Signed)
Juan Richards is a 63 y.o. male with the following history as recorded in EpicCare:  Patient Active Problem List   Diagnosis Date Noted  . Prostate cancer ([place]) 09/21/2018  . Malignant neoplasm of prostate ([place]) 09/11/2018  . Pre-diabetes 06/20/2018  . Routine general medical examination at a health care facility 08/11/2016  . DIVERTICULITIS, COLON, WITH PERFORATION 07/23/2009  . GROSS HEMATURIA 07/23/2009  . 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 EPIDIDYMAL CYST 08/31/2007    Current Outpatient Medications  Medication Sig Dispense Refill  . sulfamethoxazole-trimethoprim (BACTRIM DS,SEPTRA DS) 800-160 MG tablet Take 1 tablet by mouth 2 (two) times daily. 14 tablet 0   No current facility-administered medications for this visit.     Allergies: Patient has no known allergies.  Past Medical History:  Diagnosis Date  . DIVERTICULITIS, COLON, WITH PERFORATION 07/23/2009  . Numbness and tingling    RIGHT TOES AND LEFT LEG DUE TO L 5 DISC   . PLEURISY YRS AGO  . Prostate cancer ([place])   . 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 BACK SURGERY  2017   L4 BACK SURGERY  . PELVIC LYMPH NODE DISSECTION Bilateral 09/21/2018   Procedure: BILATERAL PELVIC LYMPH NODE DISSECTION;  Surgeon: Ardis Hughs, MD;  Location: [facility];  Service: Urology;  Laterality: Bilateral;  . ROBOT ASSISTED LAPAROSCOPIC RADICAL PROSTATECTOMY N/A 09/21/2018   Procedure: XI ROBOTIC ASSISTED LAPAROSCOPIC RADICAL PROSTATECTOMY;  Surgeon: Ardis Hughs, MD;  Location: [facility];  Service: Urology;  Laterality: N/A;    Family History  Problem Relation Age of Onset  . Diabetes Mother   . Kidney disease Mother   . Cancer Father        ?  . Diabetes Maternal Grandmother     Social History   Tobacco Use  . Smoking status: Never Smoker  . Smokeless tobacco: Never Used  Substance Use Topics  . 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   ROS- See HPI  Objective:  Vitals:   02/06/19 0802  BP: 112/84  Pulse: 76  Temp: 98.3 F (36.8 C)  TempSrc: Oral  SpO2: 97%  Weight: 229 lb (103.9 kg)  Height: 5\' 11"  (1.803 m)    General: Well developed, well nourished, in no acute distress  Skin : Warm and dry. Edema, erythema noted to distal tip of right thumb with areas of light redness streaking to right forearm, normal fingernail, no drainage Head: Normocephalic and atraumatic  Eyes: Sclera and conjunctiva clear; pupils round and reactive to light; extraocular movements intact  Oropharynx: Pink, supple. No suspicious lesions  Neck: Supple Lungs: Effort unlabored, no respiratory distress CVS exam: normal rate and regular rhythm.  Musculoskeletal:  No deformities; no active joint inflammation  Extremities: No edema, cyanosis, clubbing  Vessels: Symmetric bilaterally  Neurologic: Alert and oriented; speech intact; face symmetrical; moves all extremities well; CNII-XII intact without focal deficit  Psychiatric: Normal mood and affect.  Assessment:  1. Cellulitis of finger of right hand     Plan:   Bactrim course- medication dosing, side effects discussed Home management, red flags-worsening swelling, spreading redness, fevers- and  return precautions including when to seek immediate/emergency care discussed and printed on AVS Referral to dermatology for further evaluation of recurrent infections    No follow-ups on file.  Orders Placed This Encounter  Procedures  . Ambulatory referral to Dermatology    Referral Priority:   Routine    Referral Type:   Consultation    Referral Reason:   Specialty Services Required    Requested Specialty:   Dermatology    Number of Visits Requested:   1    Requested Prescriptions   Signed Prescriptions Disp Refills  . sulfamethoxazole-trimethoprim (BACTRIM DS,SEPTRA DS) 800-160 MG tablet 14 tablet 0    Sig: Take 1 tablet by mouth 2 (two) times daily.

## 2019-02-20 ENCOUNTER — Encounter: Payer: Self-pay | Admitting: Family

## 2019-02-20 ENCOUNTER — Ambulatory Visit: Payer: 59 | Admitting: Family

## 2019-02-20 VITALS — BP 136/82 | HR 101 | Temp 99.5°F | Ht 71.0 in | Wt 228.2 lb

## 2019-02-20 DIAGNOSIS — R6889 Other general symptoms and signs: Secondary | ICD-10-CM | POA: Diagnosis not present

## 2019-02-20 MED ORDER — OSELTAMIVIR PHOSPHATE 75 MG PO CAPS: 75.0000 mg | ORAL_CAPSULE | Freq: Two times a day (BID) | ORAL | 0 refills | Status: DC

## 2019-02-20 NOTE — Progress Notes (Signed)
Juan Richards is a 63 y.o. male with the following history as recorded in EpicCare:  Patient Active Problem List   Diagnosis Date Noted  . Prostate cancer ([place]) 09/21/2018  . Malignant neoplasm of prostate ([place]) 09/11/2018  . Pre-diabetes 06/20/2018  . Routine general medical examination at a health care facility 08/11/2016  . DIVERTICULITIS, COLON, WITH PERFORATION 07/23/2009  . GROSS HEMATURIA 07/23/2009  . 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 EPIDIDYMAL CYST 08/31/2007    Current Outpatient Medications  Medication Sig Dispense Refill  . oseltamivir (TAMIFLU) 75 MG capsule Take 1 capsule (75 mg total) by mouth 2 (two) times daily. 10 capsule 0   No current facility-administered medications for this visit.     Allergies: Patient has no known allergies.  Past Medical History:  Diagnosis Date  . DIVERTICULITIS, COLON, WITH PERFORATION 07/23/2009  . Numbness and tingling    RIGHT TOES AND LEFT LEG DUE TO L 5 DISC   . PLEURISY YRS AGO  . Prostate cancer ([place])   . 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 BACK SURGERY  2017   L4 BACK SURGERY  . PELVIC LYMPH NODE DISSECTION Bilateral 09/21/2018   Procedure: BILATERAL PELVIC LYMPH NODE DISSECTION;  Surgeon: Ardis Hughs, MD;  Location: [facility];  Service: Urology;  Laterality: Bilateral;  . ROBOT ASSISTED LAPAROSCOPIC RADICAL PROSTATECTOMY N/A 09/21/2018   Procedure: XI ROBOTIC ASSISTED LAPAROSCOPIC RADICAL PROSTATECTOMY;  Surgeon: Ardis Hughs, MD;  Location: [facility];  Service: Urology;  Laterality: N/A;    Family History  Problem Relation Age of Onset  . Diabetes Mother   . Kidney disease Mother   . Cancer Father        ?  . Diabetes Maternal Grandmother     Social History   Tobacco Use  . Smoking status: Never Smoker  . Smokeless tobacco: Never Used  Substance Use Topics  . Alcohol use: Yes    Alcohol/week: 2.0 - 3.0 standard drinks    Types: 2 - 3 Cans of  beer per week    Comment: OCC    Subjective:  Flu-like symptoms x 2 days; + cough, fever, chills; + headache; + fever- took Tylenol around 10:30 am; did not take a flu shot this year; actively being treated for prostate cancer- was just told that PSA had increased again- will be treated with chemo/ radiation.     Objective:  Vitals:   02/20/19 1320  BP: 136/82  Pulse: (!) 101  Temp: 99.5 F (37.5 C)  TempSrc: Oral  SpO2: 96%  Weight: 228 lb 2.7 oz (103.5 kg)  Height: 5\' 11"  (1.803 m)    General: Well developed, well nourished, in no acute distress  Skin : Warm and dry.  Head: Normocephalic and atraumatic  Eyes: Sclera and conjunctiva clear; pupils round and reactive to light; extraocular movements intact  Ears: External normal; canals clear; tympanic membranes normal  Oropharynx: Pink, supple. No suspicious lesions  Neck: Supple without thyromegaly, adenopathy  Lungs: Respirations unlabored; clear to auscultation bilaterally without wheeze, rales, rhonchi  CVS exam: normal rate and regular rhythm.  Neurologic: Alert and oriented; speech intact; face symmetrical; moves all extremities well; CNII-XII intact without focal deficit  Assessment:  1. Flu-like symptoms     Plan:  Rapid flu is negative but based on presentation/ risk factors, will go ahead and start Tamiflu 75 mg bid x 5 days; symptomatic treatment discussed; increase fluids, rest and follow-up  worse, no better.   No follow-ups on file.  No orders of the defined types were placed in this encounter.   Requested Prescriptions   Signed Prescriptions Disp Refills  . oseltamivir (TAMIFLU) 75 MG capsule 10 capsule 0    Sig: Take 1 capsule (75 mg total) by mouth 2 (two) times daily.

## 2019-08-21 ENCOUNTER — Encounter: Payer: Self-pay | Admitting: Gastroenterology

## 2019-08-21 ENCOUNTER — Telehealth: Payer: Self-pay | Admitting: Nurse Practitioner

## 2019-08-21 NOTE — Telephone Encounter (Signed)
Copied from [place] [id number]. Topic: General - Inquiry >> Aug 21, 2019 12:24 PM Percell Belt A wrote: Reason for CRM: pt called and stated that he is going to have radiation treatment in oct and wanted to know if the flu shot would be effected by it if he got it?   Please advise.  Pt is schd for on next week on the 10th

## 2019-08-23 NOTE — Telephone Encounter (Signed)
Pt informed of below.  

## 2019-08-23 NOTE — Telephone Encounter (Signed)
Ok to get a flu shot Thx

## 2019-08-29 ENCOUNTER — Ambulatory Visit (INDEPENDENT_AMBULATORY_CARE_PROVIDER_SITE_OTHER): Payer: 59

## 2019-08-29 ENCOUNTER — Other Ambulatory Visit: Payer: Self-pay

## 2019-08-29 DIAGNOSIS — Z23 Encounter for immunization: Secondary | ICD-10-CM

## 2019-09-02 ENCOUNTER — Ambulatory Visit (AMBULATORY_SURGERY_CENTER): Payer: Self-pay | Admitting: *Deleted

## 2019-09-02 ENCOUNTER — Other Ambulatory Visit: Payer: Self-pay

## 2019-09-02 VITALS — Temp 97.7°F | Ht 70.0 in | Wt 226.6 lb

## 2019-09-02 DIAGNOSIS — Z86010 Personal history of colonic polyps: Secondary | ICD-10-CM

## 2019-09-02 MED ORDER — NA SULFATE-K SULFATE-MG SULF 17.5-3.13-1.6 GM/177ML PO SOLN: 1.0000 | Freq: Once | ORAL | 0 refills | Status: AC

## 2019-09-02 NOTE — Progress Notes (Signed)
No egg or soy allergy known to patient  No issues with past sedation with any surgeries  or procedures, no intubation problems  No diet pills per patient No home 02 use per patient  No blood thinners per patient  Pt denies issues with constipation  No A fib or A flutter  EMMI video sent to pt's e mail  

## 2019-09-04 ENCOUNTER — Encounter: Payer: Self-pay | Admitting: Gastroenterology

## 2019-09-16 ENCOUNTER — Telehealth: Payer: Self-pay

## 2019-09-16 NOTE — Telephone Encounter (Signed)
Covid-19 screening questions   Do you now or have you had a fever in the last 14 days?  Do you have any respiratory symptoms of shortness of breath or cough now or in the last 14 days?  Do you have any family members or close contacts with diagnosed or suspected Covid-19 in the past 14 days?  Have you been tested for Covid-19 and found to be positive?       

## 2019-09-16 NOTE — Telephone Encounter (Signed)
Pt answered  "NO" to all covid questions. °

## 2019-09-17 ENCOUNTER — Ambulatory Visit (AMBULATORY_SURGERY_CENTER): Payer: 59 | Admitting: Gastroenterology

## 2019-09-17 ENCOUNTER — Other Ambulatory Visit: Payer: Self-pay

## 2019-09-17 ENCOUNTER — Other Ambulatory Visit: Payer: Self-pay | Admitting: Gastroenterology

## 2019-09-17 ENCOUNTER — Encounter: Payer: Self-pay | Admitting: Gastroenterology

## 2019-09-17 VITALS — BP 121/66 | HR 107 | Temp 99.7°F | Resp 23 | Ht 70.0 in | Wt 226.0 lb

## 2019-09-17 DIAGNOSIS — Z86010 Personal history of colonic polyps: Secondary | ICD-10-CM | POA: Diagnosis not present

## 2019-09-17 DIAGNOSIS — K635 Polyp of colon: Secondary | ICD-10-CM

## 2019-09-17 DIAGNOSIS — D125 Benign neoplasm of sigmoid colon: Secondary | ICD-10-CM

## 2019-09-17 MED ORDER — CIPROFLOXACIN HCL 500 MG PO TABS: 500.0000 mg | ORAL_TABLET | Freq: Two times a day (BID) | ORAL | 0 refills | Status: DC

## 2019-09-17 MED ORDER — METRONIDAZOLE 500 MG PO TABS: 500.0000 mg | ORAL_TABLET | Freq: Two times a day (BID) | ORAL | 0 refills | Status: DC

## 2019-09-17 MED ORDER — SODIUM CHLORIDE 0.9 % IV SOLN: 500.0000 mL | Freq: Once | INTRAVENOUS | Status: DC

## 2019-09-17 NOTE — Progress Notes (Signed)
Temp check by LC/vital check by CW.  Patient states no medical or surgical history changes since pre-visit screening on 09/02/19.

## 2019-09-17 NOTE — Patient Instructions (Signed)
PLease see handouts given to you on Polyps, Diverticulosis and Hemorrhoids. Thank you for letting us take care of your healthcare needs today. You have 2 new prescriptions to pickup at your pharmacy from Dr. Fuller Plan.     YOU HAD AN ENDOSCOPIC PROCEDURE TODAY AT [facility]:   Refer to the procedure report that was given to you for any specific questions about what was found during the examination.  If the procedure report does not answer your questions, please call your gastroenterologist to clarify.  If you requested that your care partner not be given the details of your procedure findings, then the procedure report has been included in a sealed envelope for you to review at your convenience later.  YOU SHOULD EXPECT: Some feelings of bloating in the abdomen. Passage of more gas than usual.  Walking can help get rid of the air that was put into your GI tract during the procedure and reduce the bloating. If you had a lower endoscopy (such as a colonoscopy or flexible sigmoidoscopy) you may notice spotting of blood in your stool or on the toilet paper. If you underwent a bowel prep for your procedure, you may not have a normal bowel movement for a few days.  Please Note:  You might notice some irritation and congestion in your nose or some drainage.  This is from the oxygen used during your procedure.  There is no need for concern and it should clear up in a day or so.  SYMPTOMS TO REPORT IMMEDIATELY:   Following lower endoscopy (colonoscopy or flexible sigmoidoscopy):  Excessive amounts of blood in the stool  Significant tenderness or worsening of abdominal pains  Swelling of the abdomen that is new, acute  Fever of 100F or higher    For urgent or emergent issues, a gastroenterologist can be reached at any hour by calling [phone].   DIET:  We do recommend a small meal at first, but then you may proceed to your regular diet.  Drink plenty of fluids but you should  avoid alcoholic beverages for 24 hours.  ACTIVITY:  You should plan to take it easy for the rest of today and you should NOT DRIVE or use heavy machinery until tomorrow (because of the sedation medicines used during the test).    FOLLOW UP: Our staff will call the number listed on your records 48-72 hours following your procedure to check on you and address any questions or concerns that you may have regarding the information given to you following your procedure. If we do not reach you, we will leave a message.  We will attempt to reach you two times.  During this call, we will ask if you have developed any symptoms of COVID 19. If you develop any symptoms (ie: fever, flu-like symptoms, shortness of breath, cough etc.) before then, please call [phone].  If you test positive for Covid 19 in the 2 weeks post procedure, please call and report this information to [place].    If any biopsies were taken you will be contacted by phone or by letter within the next 1-3 weeks.  Please call us at [phone] if you have not heard about the biopsies in 3 weeks.    SIGNATURES/CONFIDENTIALITY: You and/or your care partner have signed paperwork which will be entered into your electronic medical record.  These signatures attest to the fact that that the information above on your After Visit Summary has been reviewed and is understood.  Full  responsibility of the confidentiality of this discharge information lies with you and/or your care-partner.

## 2019-09-17 NOTE — Op Note (Signed)
[facility] Patient Name: Juan Richards Procedure Date: 09/17/2019 1:32 PM MRN: [id number] Endoscopist: Ladene Artist , MD Age: 63 Referring MD:  Date of Birth: 1956/02/17 Gender: Male Account #: [id number] Procedure:                Colonoscopy Indications:              Surveillance: Personal history of adenomatous                            polyps on last colonoscopy > 5 years ago Medicines:                Monitored Anesthesia Care Procedure:                Pre-Anesthesia Assessment:                           - Prior to the procedure, a History and Physical                            was performed, and patient medications and                            allergies were reviewed. The patient's tolerance of                            previous anesthesia was also reviewed. The risks                            and benefits of the procedure and the sedation                            options and risks were discussed with the patient.                            All questions were answered, and informed consent                            was obtained. Prior Anticoagulants: The patient has                            taken no previous anticoagulant or antiplatelet                            agents. ASA Grade Assessment: III - A patient with                            severe systemic disease. After reviewing the risks                            and benefits, the patient was deemed in                            satisfactory condition to undergo the procedure.  After obtaining informed consent, the colonoscope                            was passed under direct vision. Throughout the                            procedure, the patient's blood pressure, pulse, and                            oxygen saturations were monitored continuously. The                            Colonoscope was introduced through the anus and                            advanced to the the  cecum, identified by                            appendiceal orifice and ileocecal valve. The                            ileocecal valve, appendiceal orifice, and rectum                            were photographed. The quality of the bowel                            preparation was adequate. The patient tolerated the                            procedure well. The colonoscopy was somewhat                            difficult due to bowel stenosis, restricted                            mobility of the colon and significant looping.                            Successful completion of the procedure was aided by                            withdrawing the scope and replacing with the                            pediatric colonoscope. Scope In: 1:40:39 PM Scope Out: 2:14:00 PM Scope Withdrawal Time: 0 hours 16 minutes 22 seconds  Total Procedure Duration: 0 hours 33 minutes 21 seconds  Findings:                 The perianal and digital rectal examinations were                            normal.  Three sessile polyps were found in the rectum and                            sigmoid colon. The polyps were 5 to 8 mm in size.                            These polyps were removed with a cold snare.                            Resection and retrieval were complete. The 8 mm                            rectal polyp was not retrieved.                           Multiple medium-mouthed diverticula were found in                            the left colon. There was narrowing of the colon in                            association with the diverticular opening. There                            was evidence of diverticular spasm. Suspected                            purulent discharge was seen in association with the                            diverticular opening. There was no evidence of                            diverticular bleeding.                           A benign-appearing,  intrinsic moderate stenosis                            measuring 5 cm (in length) x 9 mm (inner diameter)                            was found in the sigmoid colon and was traversed.                           Internal hemorrhoids were found during                            retroflexion. The hemorrhoids were medium-sized and                            Grade I (internal hemorrhoids that do not prolapse).  The exam was otherwise without abnormality on                            direct and retroflexion views. Complications:            No immediate complications. Estimated blood loss:                            None. Estimated Blood Loss:     Estimated blood loss: none. Impression:               - Three 5 to 8 mm polyps in the rectum and in the                            sigmoid colon, removed with a cold snare. Resected                            and retrieved.                           - Severe diverticulosis with diverticulitis in the                            left colon.                           - Stricture in the sigmoid colon.                           - Internal hemorrhoids.                           - The examination was otherwise normal on direct                            and retroflexion views. Recommendation:           - Repeat colonoscopy date to be determined after                            pending pathology results are reviewed for                            surveillance based on pathology results.                           - Patient has a contact number available for                            emergencies. The signs and symptoms of potential                            delayed complications were discussed with the                            patient. Return to normal activities tomorrow.  Written discharge instructions were provided to the                            patient.                           - Resume previous  diet.                           - Continue present medications.                           - Await pathology results.                           - Cipro (ciprofloxacin) 500 mg PO BID for 10 days.                           - Flagyl (metronidazole) 500 mg PO BID for 10 days. Ladene Artist, MD 09/17/2019 2:21:57 PM This report has been signed electronically.

## 2019-09-17 NOTE — Progress Notes (Signed)
Called to room to assist during endoscopic procedure.  Patient ID and intended procedure confirmed with present staff. Received instructions for my participation in the procedure from the performing physician.  

## 2019-09-19 ENCOUNTER — Telehealth: Payer: Self-pay | Admitting: *Deleted

## 2019-09-19 ENCOUNTER — Telehealth: Payer: Self-pay

## 2019-09-19 NOTE — Telephone Encounter (Signed)
Second attempt follow up call to pt, LM on VM ?

## 2019-09-19 NOTE — Telephone Encounter (Signed)
Left message on f/u call 

## 2019-09-24 ENCOUNTER — Encounter: Payer: Self-pay | Admitting: Gastroenterology

## 2019-10-04 ENCOUNTER — Encounter: Payer: Self-pay | Admitting: *Deleted

## 2019-10-08 ENCOUNTER — Other Ambulatory Visit (HOSPITAL_COMMUNITY): Payer: Self-pay | Admitting: Urology

## 2019-10-08 DIAGNOSIS — C61 Malignant neoplasm of prostate: Secondary | ICD-10-CM

## 2019-10-09 ENCOUNTER — Encounter: Payer: Self-pay | Admitting: Radiation Oncology

## 2019-10-09 NOTE — Progress Notes (Signed)
GU Location of Tumor / Histology: biochemical recurrent prostate cancer  If Prostate Cancer, Gleason Score is (4 + 3) and PSA is (69) pretreatmrnt  Juan Richards was diagnosed with prostate cancer in 08/08/2018. His prostate cancer was treated with prostatectomy. Unfortunately, his surgical margins were positive as well as 3/16 nodes sampled.  09/26/2019 PSA 0.61    Past/Anticipated interventions by urology, if any: prostate biopsy, prostatectomy, referral to Dr. Tammi Klippel for consideration of radiotherapy to manage biochemical recurrence  Past/Anticipated interventions by medical oncology, if any: no  Weight changes, if any: no  Bowel/Bladder complaints, if any: IPSS 17. SHIM 1. Reports ED. Reports incontinence requiring 4-6 ppd. Reports urinary urgency and frequency. Reports night sweats. Reports a tingling sensation with urination. Denies hematuria.   Nausea/Vomiting, if any: no  Pain issues, if any:  Reports left sided back and leg pain. Hx of back surgery.  SAFETY ISSUES:  Prior radiation? no  Pacemaker/ICD? no  Possible current pregnancy? no, male patient  Is the patient on methotrexate? no  Current Complaints / other details:  62 year old male. Married with 1 daughter.

## 2019-10-11 ENCOUNTER — Encounter: Payer: Self-pay | Admitting: Radiation Oncology

## 2019-10-11 ENCOUNTER — Other Ambulatory Visit: Payer: Self-pay

## 2019-10-11 ENCOUNTER — Ambulatory Visit
Admission: RE | Admit: 2019-10-11 | Discharge: 2019-10-11 | Disposition: A | Discharge status: Home / Self Care | Payer: 59 | Source: Ambulatory Visit | Attending: Radiation Oncology | Admitting: Radiation Oncology

## 2019-10-11 VITALS — Ht 70.0 in | Wt 227.0 lb

## 2019-10-11 DIAGNOSIS — C61 Malignant neoplasm of prostate: Secondary | ICD-10-CM

## 2019-10-11 NOTE — Progress Notes (Signed)
Radiation Oncology         [phone] ________________________________  Outpatient Re-Consultation - Conducted via Telephone due to current COVID-19 concerns for limiting patient exposure  Name: Juan Richards MRN: [id number]  Date: 10/11/2019  DOB: Dec 13, 1956  [id number], Delphia Grates, NP  Ardis Hughs, MD   REFERRING PHYSICIAN: Ardis Hughs, MD  DIAGNOSIS: 63 y.o. gentleman with a biochemical recurrence of prostate cancer with a postoperative PSA of 0.61 s/p RALP in 09/2018 for Stage pT3b, Gleason 4+3 disease.    ICD-10-CM   1. Malignant neoplasm of prostate ([place])  C61     HISTORY OF PRESENT ILLNESS: Juan Richards is a 63 y.o. male with a diagnosis of biochemically recurrent prostate cancer. He was initially diagnosed with stage T2a, Gleason 4+3 prostate cancer by [name], with a PSA of 69.97 at time of diagnosis. He was seen in the multidisciplinary prostate clinic on 09/11/2018 for discussion of treatment options and elected to pursue prostatectomy.  He underwent a RALP with BPLND on 09/21/2018, with final pathology revealing extraprostatic extension at the right anterior and posterolateral from the apex to the base, right seminal vesicle invasion, lymphovascular invasion, a positive resection margin at the right anterior mid where EPE is present and a total of 3 positive lymph nodes (3/16).  His initial postoperative PSA on 11/07/2018 remained detectable at 0.076 and has continued to gradually rise with a most recent PSA of 0.61 on 09/26/2019. Post op PSA trend: 10/2018 PSA 0.076 02/12/19 PSA 0.11 06/19/2019 PSA 0.25 09/26/19 PSA 0.61    He is scheduled for Axumin PET scan on 10/15/2019.  He has continued with severe incontinence, despite medications and working with physical therapy, but is not currently interested in pursuing additional surgery for artificial urinary sphincter at this point and instead wishes to move forward with salvage prostate cancer  treatment and then may consider further surgery for AUS following completion of radiation.   The patient reviewed the pathology results with his urologist and he has kindly been referred today for discussion of salvage radiation treatment options.  PREVIOUS RADIATION THERAPY: No  PAST MEDICAL HISTORY:  Past Medical History:  Diagnosis Date   DIVERTICULITIS, COLON, WITH PERFORATION 07/23/2009   Numbness and tingling    RIGHT TOES AND LEFT LEG DUE TO L 5 DISC    PLEURISY YRS AGO   Prostate cancer ([place])    STENOSIS, LUMBAR SPINE 09/27/2007      PAST SURGICAL HISTORY: Past Surgical History:  Procedure Laterality Date   BACK SURGERY  2017   L4 BACK SURGERY   COLONOSCOPY     PELVIC LYMPH NODE DISSECTION Bilateral 09/21/2018   Procedure: BILATERAL PELVIC LYMPH NODE DISSECTION;  Surgeon: Ardis Hughs, MD;  Location: [facility];  Service: Urology;  Laterality: Bilateral;   POLYPECTOMY     ROBOT ASSISTED LAPAROSCOPIC RADICAL PROSTATECTOMY N/A 09/21/2018   Procedure: XI ROBOTIC ASSISTED LAPAROSCOPIC RADICAL PROSTATECTOMY;  Surgeon: Ardis Hughs, MD;  Location: [facility];  Service: Urology;  Laterality: N/A;    FAMILY HISTORY:  Family History  Problem Relation Age of Onset   Diabetes Mother    Kidney disease Mother    Cancer Father        ?   Diabetes Maternal Grandmother    Colon cancer Brother    Esophageal cancer Neg Hx    Rectal cancer Neg Hx    Stomach cancer Neg Hx     SOCIAL HISTORY:  Social History   Socioeconomic History  Marital status: Married    Spouse name: Not on file   Number of children: 1   Years of education: 14   Highest education level: Not on file  Occupational History   Occupation: [employer] [place] resource strain: Not on file   Food insecurity    Worry: Not on file    Inability: Not on file   Transportation needs    Medical: Not on file    Non-medical: Not on file  Tobacco Use    Smoking status: Never Smoker   Smokeless tobacco: Never Used  Substance and Sexual Activity   Alcohol use: Yes    Alcohol/week: 2.0 - 3.0 standard drinks    Types: 2 - 3 Cans of beer per week    Comment: OCC   Drug use: No   Sexual activity: Yes  Lifestyle   Physical activity    Days per week: Not on file    Minutes per session: Not on file   Stress: Not on file  Relationships   Social connections    Talks on phone: Not on file    Gets together: Not on file    Attends religious service: Not on file    Active member of club or organization: Not on file    Attends meetings of clubs or organizations: Not on file    Relationship status: Not on file   Intimate partner violence    Fear of current or ex partner: Not on file    Emotionally abused: Not on file    Physically abused: Not on file    Forced sexual activity: Not on file  Other Topics Concern   Not on file  Social History Narrative   Fun: Watch TV    ALLERGIES: Patient has no known allergies.  MEDICATIONS:  Current Outpatient Medications  Medication Sig Dispense Refill   sildenafil (VIAGRA) 100 MG tablet Take 100 mg by mouth daily.     No current facility-administered medications for this encounter.     REVIEW OF SYSTEMS:  On review of systems, the patient reports that he is doing well overall. He denies any chest pain, shortness of breath, cough, fevers, chills, night sweats, unintended weight changes. He denies any bowel disturbances, and denies abdominal pain, nausea or vomiting. He reports left-sided back and leg pain and notes a history of back surgery. His IPSS was 17, indicating severe urinary symptoms. He reports incontinence requiring 4-6 pads per day, urinary urgency and frequency, night sweats, and a tingling sensation with urination. His SHIM was 1, indicating he has severe erectile dysfunction. A complete review of systems is obtained and is otherwise negative.    PHYSICAL EXAM:  Wt Readings  from Last 3 Encounters:  10/11/19 227 lb (103 kg)  09/17/19 226 lb (102.5 kg)  09/02/19 226 lb 9.6 oz (102.8 kg)   Temp Readings from Last 3 Encounters:  09/17/19 99.7 F (37.6 C)  09/02/19 97.7 F (36.5 C) (Temporal)  02/20/19 99.5 F (37.5 C) (Oral)   BP Readings from Last 3 Encounters:  09/17/19 121/66  02/20/19 136/82  02/06/19 112/84   Pulse Readings from Last 3 Encounters:  09/17/19 (!) 107  02/20/19 (!) 101  02/06/19 76   Pain Assessment Pain Score: 0-No pain Pain Frequency: Intermittent Pain Loc: Leg(ache in left hip)/10  Physical exam not performed in light of telephone encounter.   KPS = 100  100 - Normal; no complaints; no evidence of disease. 90   -  Able to carry on normal activity; minor signs or symptoms of disease. 80   - Normal activity with effort; some signs or symptoms of disease. 6   - Cares for self; unable to carry on normal activity or to do active work. 60   - Requires occasional assistance, but is able to care for most of his personal needs. 50   - Requires considerable assistance and frequent medical care. 37   - Disabled; requires special care and assistance. 34   - Severely disabled; hospital admission is indicated although death not imminent. 10   - Very sick; hospital admission necessary; active supportive treatment necessary. 10   - Moribund; fatal processes progressing rapidly. 0     - Dead  Karnofsky DA, Abelmann [place], Craver LS and Burchenal [facility] [phone]) The use of the nitrogen mustards in the palliative treatment of carcinoma: with particular reference to bronchogenic carcinoma Cancer 1 [phone]  LABORATORY DATA:  Lab Results  Component Value Date   WBC 9.2 09/22/2018   HGB 10.5 (L) 09/22/2018   HCT 32.1 (L) 09/22/2018   MCV 92.0 09/22/2018   PLT 224 09/22/2018   Lab Results  Component Value Date   NA 138 09/22/2018   K 3.7 09/22/2018   CL 105 09/22/2018   CO2 26 09/22/2018   Lab Results  Component Value Date   ALT 17  06/18/2018   AST 17 06/18/2018   ALKPHOS 48 06/18/2018   BILITOT 0.4 06/18/2018     RADIOGRAPHY: No results found.    IMPRESSION/PLAN:This visit was conducted via Telephone to spare the patient unnecessary potential exposure in the healthcare setting during the current COVID-19 pandemic.  1. 63 y.o. gentleman with a biochemical recurrence of prostate cancer with a postoperative PSA of 0.61 s/p RALP in 09/2018 for Stage pT3b, Gleason 4+3 disease. Today we reviewed the findings and workup thus far.  We discussed the natural history of prostate cancer.  We reviewed the the implications of positive margins, extracapsular extension, and seminal vesicle involvement on the risk of prostate cancer recurrence, particularly in the setting of detectable, rising postoperative PSA. We reviewed some of the evidence suggesting an advantage for patients who undergo adjuvant radiotherapy in the setting in terms of disease control and overall survival. We discussed radiation treatment directed to the prostatic fossa with regard to the logistics and delivery of external beam radiation treatment. We discussed and outlined the risks, benefits, short and long-term effects associated with salvage prostate fossa radiotherapy. We also discussed the role of concurrent ST-ADT in the treatment of recurrent high risk prostate cancer.  At the end of the conversation, the patient is interested in moving forward with 7.5 weeks of salvage radiation therapy directed at the prostate fossa concurrent with short-term ADT. Pending there are no unexpected findings on his Axumin PET scan on 10/15/19, he is tentatively scheduled for CT simulation on Friday, 10/18/2019, at 2 pm. We will share our discussion with [name] and make arrangements for a follow-up visit at [facility] to start ADT prior to [facility] in anticipation of beginning his salvage radiation treatments in the near future.  We enjoyed meeting him today and look forward to  participating in the care of this very nice gentleman.  Given current concerns for patient exposure during the COVID-19 pandemic, this encounter was conducted via telephone. The patient was notified in advance and was offered a MyChart meeting to allow for face to face communication but unfortunately reported that he did not have  the appropriate resources/technology to support such a visit and instead preferred to proceed with telephone consult. The patient has given verbal consent for this type of encounter. The time spent during this encounter was 45 minutes. The attendants for this meeting include Tyler Pita MD, Ashlyn Bruning PA-C, [place], patient, Fransisco Hertz. During the encounter, Tyler Pita MD, Ashlyn Bruning PA-C, and scribe, Wilburn Mylar were located at [place].  Patient, Juan Richards was located at home.    Nicholos Johns, PA-C    Tyler Pita, MD  [facility] Direct Dial: [phone]   Fax: [phone] [email or web address]   Skype   [email or web address]  This document serves as a record of services personally performed by Tyler Pita, MD and Freeman Caldron, PA-C. It was created on their behalf by Wilburn Mylar, a trained medical scribe. The creation of this record is based on the scribe's personal observations and the provider's statements to them. This document has been checked and approved by the attending provider.

## 2019-10-14 ENCOUNTER — Telehealth: Payer: Self-pay | Admitting: *Deleted

## 2019-10-14 NOTE — Telephone Encounter (Signed)
CALLED PATIENT TO INFORM OF ADT APPT. FOR 10-15-19- ARRIVAL TIME- 7:30 AM @ DR. HERRICK'S OFFICE, SPOKE WITH PATIENT AND HE IS AWARE OF THIS APPT.

## 2019-10-15 ENCOUNTER — Ambulatory Visit (HOSPITAL_COMMUNITY)
Admission: RE | Admit: 2019-10-15 | Discharge: 2019-10-15 | Disposition: A | Discharge status: Home / Self Care | Payer: 59 | Source: Ambulatory Visit | Attending: Urology | Admitting: Urology

## 2019-10-15 ENCOUNTER — Telehealth: Payer: Self-pay | Admitting: Medical Oncology

## 2019-10-15 ENCOUNTER — Other Ambulatory Visit: Payer: Self-pay

## 2019-10-15 DIAGNOSIS — C61 Malignant neoplasm of prostate: Secondary | ICD-10-CM | POA: Diagnosis present

## 2019-10-15 MED ORDER — AXUMIN (FLUCICLOVINE F 18) INJECTION
9.8000 | Freq: Once | INTRAVENOUS | Status: AC
  Administered 2019-10-15: 9.8 via INTRAVENOUS

## 2019-10-15 NOTE — Telephone Encounter (Signed)
Left message as follow up to consult with Dr. Tammi Klippel 10/23. He is scheduled for ADT 11/13 with [name]. I asked him to return my call.

## 2019-10-17 ENCOUNTER — Telehealth: Payer: Self-pay | Admitting: *Deleted

## 2019-10-17 NOTE — Telephone Encounter (Signed)
Called patient to remind of sim appt. for 10-18-19 - arrival time- 1:45 pm @ [place], spoke with patient and he is aware of this appt.

## 2019-10-18 ENCOUNTER — Encounter: Payer: Self-pay | Admitting: Medical Oncology

## 2019-10-18 ENCOUNTER — Ambulatory Visit
Admission: RE | Admit: 2019-10-18 | Discharge: 2019-10-18 | Disposition: A | Discharge status: Home / Self Care | Payer: 59 | Source: Ambulatory Visit | Attending: Radiation Oncology | Admitting: Radiation Oncology

## 2019-10-18 ENCOUNTER — Other Ambulatory Visit: Payer: Self-pay

## 2019-10-18 DIAGNOSIS — C61 Malignant neoplasm of prostate: Secondary | ICD-10-CM | POA: Insufficient documentation

## 2019-10-18 NOTE — Progress Notes (Signed)
  Radiation Oncology         [phone] ________________________________  Name: Juan Richards MRN: [id number]  Date: 10/18/2019  DOB: [birth year]  SIMULATION AND TREATMENT PLANNING NOTE    ICD-10-CM   1. Prostate cancer [facility])  C61     DIAGNOSIS:  63 y.o. gentleman with a biochemical recurrence of prostate cancer with a postoperative PSA of 0.61 s/p RALP in 09/2018 for Stage pT3b, Gleason 4+3 disease.  NARRATIVE:  The patient was brought to the [facility].  Identity was confirmed.  All relevant records and images related to the planned course of therapy were reviewed.  The patient freely provided informed written consent to proceed with treatment after reviewing the details related to the planned course of therapy. The consent form was witnessed and verified by the simulation staff.  Then, the patient was set-up in a stable reproducible supine position for radiation therapy.  A vacuum lock pillow device was custom fabricated to position his legs in a reproducible immobilized position.  Then, I performed a urethrogram under sterile conditions to identify the prostatic bed.  CT images were obtained.  Surface markings were placed.  The CT images were loaded into the planning software.  Then the prostate bed target, pelvic lymph node target and avoidance structures including the rectum, bladder, bowel and hips were contoured.  Treatment planning then occurred.  The radiation prescription was entered and confirmed.  A total of one complex treatment devices were fabricated. I have requested : Intensity Modulated Radiotherapy (IMRT) is medically necessary for this case for the following reason:  Rectal sparing.Marland Kitchen  PLAN:  The patient will receive 45 Gy in 25 fractions of 1.8 Gy, followed by a boost to the prostate bed to a total dose of 68.4 Gy with 13 additional fractions of 1.8 Gy.   ________________________________  Sheral Apley Tammi Klippel, M.D.   This document serves as a record of  services personally performed by Tyler Pita, MD. It was created on his behalf by Wilburn Mylar, a trained medical scribe. The creation of this record is based on the scribe's personal observations and the provider's statements to them. This document has been checked and approved by the attending provider.

## 2019-10-23 DIAGNOSIS — C61 Malignant neoplasm of prostate: Secondary | ICD-10-CM | POA: Insufficient documentation

## 2019-10-23 DIAGNOSIS — R300 Dysuria: Secondary | ICD-10-CM | POA: Diagnosis present

## 2019-10-25 ENCOUNTER — Encounter: Payer: Self-pay | Admitting: Medical Oncology

## 2019-10-25 ENCOUNTER — Telehealth: Payer: Self-pay | Admitting: Internal Medicine

## 2019-10-25 NOTE — Telephone Encounter (Addendum)
°  Regarding: transfer of care Ms Juan Richards or other Scheduler,  Pt due now for CPX

## 2019-10-25 NOTE — Telephone Encounter (Signed)
LVM for patient to call back to schedule appt

## 2019-10-29 ENCOUNTER — Encounter: Payer: Self-pay | Admitting: Medical Oncology

## 2019-10-29 ENCOUNTER — Other Ambulatory Visit: Payer: Self-pay

## 2019-10-29 ENCOUNTER — Encounter: Payer: Self-pay | Admitting: Internal Medicine

## 2019-10-29 ENCOUNTER — Ambulatory Visit
Admission: RE | Admit: 2019-10-29 | Discharge: 2019-10-29 | Disposition: A | Discharge status: Home / Self Care | Payer: 59 | Source: Ambulatory Visit | Attending: Radiation Oncology | Admitting: Radiation Oncology

## 2019-10-29 ENCOUNTER — Ambulatory Visit (INDEPENDENT_AMBULATORY_CARE_PROVIDER_SITE_OTHER): Payer: 59 | Admitting: Internal Medicine

## 2019-10-29 ENCOUNTER — Other Ambulatory Visit (INDEPENDENT_AMBULATORY_CARE_PROVIDER_SITE_OTHER): Payer: 59

## 2019-10-29 VITALS — BP 116/78 | HR 98 | Temp 99.0°F | Ht 70.0 in | Wt 229.0 lb

## 2019-10-29 DIAGNOSIS — E538 Deficiency of other specified B group vitamins: Secondary | ICD-10-CM

## 2019-10-29 DIAGNOSIS — E559 Vitamin D deficiency, unspecified: Secondary | ICD-10-CM | POA: Diagnosis not present

## 2019-10-29 DIAGNOSIS — R7303 Prediabetes: Secondary | ICD-10-CM

## 2019-10-29 DIAGNOSIS — E611 Iron deficiency: Secondary | ICD-10-CM | POA: Diagnosis not present

## 2019-10-29 DIAGNOSIS — Z0000 Encounter for general adult medical examination without abnormal findings: Secondary | ICD-10-CM | POA: Diagnosis not present

## 2019-10-29 DIAGNOSIS — C61 Malignant neoplasm of prostate: Secondary | ICD-10-CM | POA: Diagnosis not present

## 2019-10-29 LAB — LIPID PANEL
Cholesterol: 183 mg/dL (ref 0–200)
HDL: 55.5 mg/dL (ref >39.00)
LDL Cholesterol: 100 mg/dL — ABNORMAL HIGH (ref 0–99)
NonHDL: 127.14
Total CHOL/HDL Ratio: 3
Triglycerides: 135 mg/dL (ref 0.0–149.0)
VLDL: 27 mg/dL (ref 0.0–40.0)

## 2019-10-29 LAB — CBC WITH DIFFERENTIAL/PLATELET
Basophils Absolute: 0.1 10*3/uL (ref 0.0–0.1)
Basophils Relative: 1.4 % (ref 0.0–3.0)
Eosinophils Absolute: 0.1 10*3/uL (ref 0.0–0.7)
Eosinophils Relative: 1.7 % (ref 0.0–5.0)
HCT: 39.3 % (ref 39.0–52.0)
Hemoglobin: 13 g/dL (ref 13.0–17.0)
Lymphocytes Relative: 35.1 % (ref 12.0–46.0)
Lymphs Abs: 2.9 10*3/uL (ref 0.7–4.0)
MCHC: 33.1 g/dL (ref 30.0–36.0)
MCV: 90.1 fl (ref 78.0–100.0)
Monocytes Absolute: 0.7 10*3/uL (ref 0.1–1.0)
Monocytes Relative: 7.9 % (ref 3.0–12.0)
Neutro Abs: 4.5 10*3/uL (ref 1.4–7.7)
Neutrophils Relative %: 53.9 % (ref 43.0–77.0)
Platelets: 241 10*3/uL (ref 150.0–400.0)
RBC: 4.36 Mil/uL (ref 4.22–5.81)
RDW: 13.4 % (ref 11.5–15.5)
WBC: 8.4 10*3/uL (ref 4.0–10.5)

## 2019-10-29 LAB — BASIC METABOLIC PANEL
BUN: 10 mg/dL (ref 6–23)
CO2: 28 mEq/L (ref 19–32)
Calcium: 9.8 mg/dL (ref 8.4–10.5)
Chloride: 102 mEq/L (ref 96–112)
Creatinine, Ser: 0.8 mg/dL (ref 0.40–1.50)
GFR: 117.82 mL/min (ref >60.00)
Glucose, Bld: 83 mg/dL (ref 70–99)
Potassium: 4 mEq/L (ref 3.5–5.1)
Sodium: 136 mEq/L (ref 135–145)

## 2019-10-29 LAB — IBC PANEL
Iron: 101 ug/dL (ref 42–165)
Saturation Ratios: 35.5 % (ref 20.0–50.0)
Transferrin: 203 mg/dL — ABNORMAL LOW (ref 212.0–360.0)

## 2019-10-29 LAB — VITAMIN B12: Vitamin B-12: 226 pg/mL (ref 211–911)

## 2019-10-29 LAB — HEPATIC FUNCTION PANEL
ALT: 14 U/L (ref 0–53)
AST: 12 U/L (ref 0–37)
Albumin: 4.4 g/dL (ref 3.5–5.2)
Alkaline Phosphatase: 57 U/L (ref 39–117)
Bilirubin, Direct: 0.1 mg/dL (ref 0.0–0.3)
Total Bilirubin: 0.5 mg/dL (ref 0.2–1.2)
Total Protein: 7.8 g/dL (ref 6.0–8.3)

## 2019-10-29 LAB — FERRITIN: Ferritin: 152.4 ng/mL (ref 22.0–322.0)

## 2019-10-29 LAB — TSH: TSH: 0.97 u[IU]/mL (ref 0.35–4.50)

## 2019-10-29 LAB — HEMOGLOBIN A1C: Hgb A1c MFr Bld: 5.8 % (ref 4.6–6.5)

## 2019-10-29 LAB — VITAMIN D 25 HYDROXY (VIT D DEFICIENCY, FRACTURES): VITD: 20.91 ng/mL — ABNORMAL LOW (ref 30.00–100.00)

## 2019-10-29 NOTE — Progress Notes (Signed)
Subjective:    Patient ID: Juan Richards, male    DOB: [birth year], 63 y.o.   MRN: [id number]  HPI  Here for wellness and f/u;  Overall doing ok;  Pt denies Chest pain, worsening SOB, DOE, wheezing, orthopnea, PND, worsening LE edema, palpitations, dizziness or syncope.  Pt denies neurological change such as new headache, facial or extremity weakness.  Pt denies polydipsia, polyuria, or low sugar symptoms. Pt states overall good compliance with treatment and medications, good tolerability, and has been trying to follow appropriate diet.  Pt denies worsening depressive symptoms, suicidal ideation or panic. No fever, night sweats, wt loss, loss of appetite, or other constitutional symptoms.  Pt states good ability with ADL's, has low fall risk, home safety reviewed and adequate, no other significant changes in hearing or vision, and only occasionally active with exercise. S/p XRT started to pelvic area only. No oert bleeding Past Medical History:  Diagnosis Date  . DIVERTICULITIS, COLON, WITH PERFORATION 07/23/2009  . Numbness and tingling    RIGHT TOES AND LEFT LEG DUE TO L 5 DISC   . PLEURISY YRS AGO  . Prostate cancer ([place])   . 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 BACK SURGERY  2017   L4 BACK SURGERY  . COLONOSCOPY    . PELVIC LYMPH NODE DISSECTION Bilateral 09/21/2018   Procedure: BILATERAL PELVIC LYMPH NODE DISSECTION;  Surgeon: Ardis Hughs, MD;  Location: [facility];  Service: Urology;  Laterality: Bilateral;  . POLYPECTOMY    . ROBOT ASSISTED LAPAROSCOPIC RADICAL PROSTATECTOMY N/A 09/21/2018   Procedure: XI ROBOTIC ASSISTED LAPAROSCOPIC RADICAL PROSTATECTOMY;  Surgeon: Ardis Hughs, MD;  Location: [facility];  Service: Urology;  Laterality: N/A;    reports that he has never smoked. He has never used smokeless tobacco. He reports current alcohol use of about 2.0 - 3.0 standard drinks of alcohol per week. He reports that he does  not use drugs. family history includes Cancer in his father; Colon cancer in his brother; Diabetes in his maternal grandmother and mother; Kidney disease in his mother. No Known Allergies Current Outpatient Medications on File Prior to Visit  Medication Sig Dispense Refill  . sildenafil (VIAGRA) 100 MG tablet Take 100 mg by mouth daily.     No current facility-administered medications on file prior to visit.    Review of Systems Constitutional: Negative for other unusual diaphoresis, sweats, appetite or weight changes HENT: Negative for other worsening hearing loss, ear pain, facial swelling, mouth sores or neck stiffness.   Eyes: Negative for other worsening pain, redness or other visual disturbance.  Respiratory: Negative for other stridor or swelling Cardiovascular: Negative for other palpitations or other chest pain  Gastrointestinal: Negative for worsening diarrhea or loose stools, blood in stool, distention or other pain Genitourinary: Negative for hematuria, flank pain or other change in urine volume.  Musculoskeletal: Negative for myalgias or other joint swelling.  Skin: Negative for other color change, or other wound or worsening drainage.  Neurological: Negative for other syncope or numbness. Hematological: Negative for other adenopathy or swelling Psychiatric/Behavioral: Negative for hallucinations, other worsening agitation, SI, self-injury, or new decreased concentration All otherwise neg per pt     Objective:   Physical Exam BP 116/78   Pulse 98   Temp 99 F (37.2 C) (Oral)   Ht 5\' 10"  (1.778 m)   Wt 229 lb (103.9 kg)   SpO2 95%   BMI 32.86 kg/m  VS noted,  Constitutional: Pt is oriented to person, place, and time. Appears well-developed and well-nourished, in no significant distress and comfortable Head: Normocephalic and atraumatic  Eyes: Conjunctivae and EOM are normal. Pupils are equal, round, and reactive to light Right Ear: External ear normal without  discharge Left Ear: External ear normal without discharge Nose: Nose without discharge or deformity Mouth/Throat: Oropharynx is without other ulcerations and moist  Neck: Normal range of motion. Neck supple. No JVD present. No tracheal deviation present or significant neck LA or mass Cardiovascular: Normal rate, regular rhythm, normal heart sounds and intact distal pulses.   Pulmonary/Chest: WOB normal and breath sounds without rales or wheezing  Abdominal: Soft. Bowel sounds are normal. NT. No HSM  Musculoskeletal: Normal range of motion. Exhibits no edema Lymphadenopathy: Has no other cervical adenopathy.  Neurological: Pt is alert and oriented to person, place, and time. Pt has normal reflexes. No cranial nerve deficit. Motor grossly intact, Gait intact Skin: Skin is warm and dry. No rash noted or new ulcerations Psychiatric:  Has normal mood and affect. Behavior is normal without agitation All otherwise neg per pt Lab Results  Component Value Date   WBC 8.4 10/29/2019   HGB 13.0 10/29/2019   HCT 39.3 10/29/2019   PLT 241.0 10/29/2019   GLUCOSE 83 10/29/2019   CHOL 183 10/29/2019   TRIG 135.0 10/29/2019   HDL 55.50 10/29/2019   LDLCALC 100 (H) 10/29/2019   ALT 14 10/29/2019   AST 12 10/29/2019   NA 136 10/29/2019   K 4.0 10/29/2019   CL 102 10/29/2019   CREATININE 0.80 10/29/2019   BUN 10 10/29/2019   CO2 28 10/29/2019   TSH 0.97 10/29/2019   PSA 60.97 (H) 06/18/2018   INR 1.1 07/23/2009   HGBA1C 5.8 10/29/2019         Assessment & Plan:

## 2019-10-29 NOTE — Patient Instructions (Signed)
Please continue all other medications as before, and refills have been done if requested.  Please have the pharmacy call with any other refills you may need.  Please continue your efforts at being more active, low cholesterol diet, and weight control.  You are otherwise up to date with prevention measures today.  Please keep your appointments with your specialists as you may have planned - the radiation therapy  Please go to the LAB in the Basement (turn left off the elevator) for the tests to be done today  You will be contacted by phone if any changes need to be made immediately.  Otherwise, you will receive a letter about your results with an explanation, but please check with MyChart first.  Please remember to sign up for MyChart if you have not done so, as this will be important to you in the future with finding out test results, communicating by private email, and scheduling acute appointments online when needed.  Please return in 1 year for your yearly visit, or sooner if needed, with Lab testing done 3-5 days before

## 2019-10-30 ENCOUNTER — Encounter: Payer: Self-pay | Admitting: Internal Medicine

## 2019-10-30 ENCOUNTER — Ambulatory Visit
Admission: RE | Admit: 2019-10-30 | Discharge: 2019-10-30 | Disposition: A | Discharge status: Home / Self Care | Payer: 59 | Source: Ambulatory Visit | Attending: Radiation Oncology | Admitting: Radiation Oncology

## 2019-10-30 ENCOUNTER — Other Ambulatory Visit: Payer: Self-pay

## 2019-10-30 DIAGNOSIS — C61 Malignant neoplasm of prostate: Secondary | ICD-10-CM | POA: Diagnosis not present

## 2019-10-30 NOTE — Assessment & Plan Note (Signed)
stable overall by history and exam, recent data reviewed with pt, and pt to continue medical treatment as before,  to f/u any worsening symptoms or concerns  

## 2019-10-30 NOTE — Assessment & Plan Note (Signed)

## 2019-10-31 ENCOUNTER — Ambulatory Visit
Admission: RE | Admit: 2019-10-31 | Discharge: 2019-10-31 | Disposition: A | Discharge status: Home / Self Care | Payer: 59 | Source: Ambulatory Visit | Attending: Radiation Oncology | Admitting: Radiation Oncology

## 2019-10-31 ENCOUNTER — Other Ambulatory Visit: Payer: Self-pay

## 2019-10-31 ENCOUNTER — Other Ambulatory Visit: Payer: Self-pay | Admitting: Internal Medicine

## 2019-10-31 DIAGNOSIS — C61 Malignant neoplasm of prostate: Secondary | ICD-10-CM | POA: Diagnosis not present

## 2019-10-31 MED ORDER — VITAMIN D (ERGOCALCIFEROL) 1.25 MG (50000 UNIT) PO CAPS: 50000.0000 [IU] | ORAL_CAPSULE | ORAL | 0 refills | Status: DC

## 2019-11-01 ENCOUNTER — Ambulatory Visit
Admission: RE | Admit: 2019-11-01 | Discharge: 2019-11-01 | Disposition: A | Discharge status: Home / Self Care | Payer: 59 | Source: Ambulatory Visit | Attending: Radiation Oncology | Admitting: Radiation Oncology

## 2019-11-01 ENCOUNTER — Other Ambulatory Visit: Payer: Self-pay

## 2019-11-01 DIAGNOSIS — C61 Malignant neoplasm of prostate: Secondary | ICD-10-CM | POA: Diagnosis not present

## 2019-11-04 ENCOUNTER — Ambulatory Visit
Admission: RE | Admit: 2019-11-04 | Discharge: 2019-11-04 | Disposition: A | Discharge status: Home / Self Care | Payer: 59 | Source: Ambulatory Visit | Attending: Radiation Oncology | Admitting: Radiation Oncology

## 2019-11-04 ENCOUNTER — Other Ambulatory Visit: Payer: Self-pay

## 2019-11-04 DIAGNOSIS — C61 Malignant neoplasm of prostate: Secondary | ICD-10-CM | POA: Diagnosis not present

## 2019-11-05 ENCOUNTER — Other Ambulatory Visit: Payer: Self-pay

## 2019-11-05 ENCOUNTER — Ambulatory Visit
Admission: RE | Admit: 2019-11-05 | Discharge: 2019-11-05 | Disposition: A | Discharge status: Home / Self Care | Payer: 59 | Source: Ambulatory Visit | Attending: Radiation Oncology | Admitting: Radiation Oncology

## 2019-11-05 DIAGNOSIS — C61 Malignant neoplasm of prostate: Secondary | ICD-10-CM | POA: Diagnosis not present

## 2019-11-06 ENCOUNTER — Other Ambulatory Visit: Payer: Self-pay

## 2019-11-06 ENCOUNTER — Ambulatory Visit
Admission: RE | Admit: 2019-11-06 | Discharge: 2019-11-06 | Disposition: A | Discharge status: Home / Self Care | Payer: 59 | Source: Ambulatory Visit | Attending: Radiation Oncology | Admitting: Radiation Oncology

## 2019-11-06 DIAGNOSIS — C61 Malignant neoplasm of prostate: Secondary | ICD-10-CM | POA: Diagnosis not present

## 2019-11-07 ENCOUNTER — Other Ambulatory Visit: Payer: Self-pay

## 2019-11-07 ENCOUNTER — Ambulatory Visit
Admission: RE | Admit: 2019-11-07 | Discharge: 2019-11-07 | Disposition: A | Discharge status: Home / Self Care | Payer: 59 | Source: Ambulatory Visit | Attending: Radiation Oncology | Admitting: Radiation Oncology

## 2019-11-07 DIAGNOSIS — C61 Malignant neoplasm of prostate: Secondary | ICD-10-CM | POA: Diagnosis not present

## 2019-11-07 DIAGNOSIS — R300 Dysuria: Secondary | ICD-10-CM

## 2019-11-07 NOTE — Progress Notes (Signed)
Received patient in the clinic today following radiation treatment for purpose of educating patient. Patient reports new onset dysuria without hematuria. Patient afebrile. Patient instructed to presented tomorrow thirty minutes prior to radiation treatment to provide a urine sample for culture and analysis prior to treatment. Patient understands Dr. Tammi Klippel will review urinalysis results during PUT encounter.

## 2019-11-08 ENCOUNTER — Other Ambulatory Visit: Payer: Self-pay

## 2019-11-08 ENCOUNTER — Ambulatory Visit
Admission: RE | Admit: 2019-11-08 | Discharge: 2019-11-08 | Disposition: A | Discharge status: Home / Self Care | Payer: 59 | Source: Ambulatory Visit | Attending: Radiation Oncology | Admitting: Radiation Oncology

## 2019-11-08 DIAGNOSIS — C61 Malignant neoplasm of prostate: Secondary | ICD-10-CM | POA: Diagnosis not present

## 2019-11-08 DIAGNOSIS — R300 Dysuria: Secondary | ICD-10-CM

## 2019-11-08 LAB — URINALYSIS, COMPLETE (UACMP) WITH MICROSCOPIC
Bacteria, UA: NONE SEEN
Bilirubin Urine: NEGATIVE
Glucose, UA: NEGATIVE mg/dL
Hgb urine dipstick: NEGATIVE
Ketones, ur: NEGATIVE mg/dL
Leukocytes,Ua: NEGATIVE
Nitrite: NEGATIVE
Protein, ur: NEGATIVE mg/dL
Specific Gravity, Urine: 1.016 (ref 1.005–1.030)
pH: 6 (ref 5.0–8.0)

## 2019-11-08 MED ORDER — CIPROFLOXACIN HCL 500 MG PO TABS: 500.0000 mg | ORAL_TABLET | Freq: Two times a day (BID) | ORAL | 0 refills | Status: DC

## 2019-11-09 LAB — URINE CULTURE: Culture: <10000 — AB

## 2019-11-10 ENCOUNTER — Other Ambulatory Visit: Payer: Self-pay

## 2019-11-10 ENCOUNTER — Ambulatory Visit
Admission: RE | Admit: 2019-11-10 | Discharge: 2019-11-10 | Disposition: A | Discharge status: Home / Self Care | Payer: 59 | Source: Ambulatory Visit | Attending: Radiation Oncology | Admitting: Radiation Oncology

## 2019-11-10 DIAGNOSIS — C61 Malignant neoplasm of prostate: Secondary | ICD-10-CM | POA: Diagnosis not present

## 2019-11-11 ENCOUNTER — Ambulatory Visit
Admission: RE | Admit: 2019-11-11 | Discharge: 2019-11-11 | Disposition: A | Discharge status: Home / Self Care | Payer: 59 | Source: Ambulatory Visit | Attending: Radiation Oncology | Admitting: Radiation Oncology

## 2019-11-11 ENCOUNTER — Other Ambulatory Visit: Payer: Self-pay

## 2019-11-11 ENCOUNTER — Telehealth: Payer: Self-pay | Admitting: Radiation Oncology

## 2019-11-11 DIAGNOSIS — C61 Malignant neoplasm of prostate: Secondary | ICD-10-CM | POA: Diagnosis not present

## 2019-11-11 NOTE — Telephone Encounter (Signed)
-----   Message from Tyler Pita, MD sent at 11/11/2019  2:40 PM EST ----- I gave him CIPRO and AZO Friday empirically due to very compelling symptoms.  Let him know culture negative and see how he doing.

## 2019-11-11 NOTE — Telephone Encounter (Signed)
Phoned patient to inform him urine test was negative for infection. No answer. Left voicemail message requesting return call to assess status. Provided my contact information on the voicemail. Awaiting return call.

## 2019-11-12 ENCOUNTER — Other Ambulatory Visit: Payer: Self-pay

## 2019-11-12 ENCOUNTER — Ambulatory Visit
Admission: RE | Admit: 2019-11-12 | Discharge: 2019-11-12 | Disposition: A | Discharge status: Home / Self Care | Payer: 59 | Source: Ambulatory Visit | Attending: Radiation Oncology | Admitting: Radiation Oncology

## 2019-11-12 DIAGNOSIS — C61 Malignant neoplasm of prostate: Secondary | ICD-10-CM | POA: Diagnosis not present

## 2019-11-13 ENCOUNTER — Ambulatory Visit
Admission: RE | Admit: 2019-11-13 | Discharge: 2019-11-13 | Disposition: A | Discharge status: Home / Self Care | Payer: 59 | Source: Ambulatory Visit | Attending: Radiation Oncology | Admitting: Radiation Oncology

## 2019-11-13 ENCOUNTER — Other Ambulatory Visit: Payer: Self-pay

## 2019-11-13 DIAGNOSIS — C61 Malignant neoplasm of prostate: Secondary | ICD-10-CM | POA: Diagnosis not present

## 2019-11-18 ENCOUNTER — Ambulatory Visit
Admission: RE | Admit: 2019-11-18 | Discharge: 2019-11-18 | Disposition: A | Discharge status: Home / Self Care | Payer: 59 | Source: Ambulatory Visit | Attending: Radiation Oncology | Admitting: Radiation Oncology

## 2019-11-18 ENCOUNTER — Other Ambulatory Visit: Payer: Self-pay

## 2019-11-18 DIAGNOSIS — C61 Malignant neoplasm of prostate: Secondary | ICD-10-CM | POA: Diagnosis not present

## 2019-11-19 ENCOUNTER — Other Ambulatory Visit: Payer: Self-pay

## 2019-11-19 ENCOUNTER — Ambulatory Visit
Admission: RE | Admit: 2019-11-19 | Discharge: 2019-11-19 | Disposition: A | Discharge status: Home / Self Care | Payer: 59 | Source: Ambulatory Visit | Attending: Radiation Oncology | Admitting: Radiation Oncology

## 2019-11-19 DIAGNOSIS — R300 Dysuria: Secondary | ICD-10-CM | POA: Insufficient documentation

## 2019-11-19 DIAGNOSIS — C61 Malignant neoplasm of prostate: Secondary | ICD-10-CM | POA: Insufficient documentation

## 2019-11-20 ENCOUNTER — Ambulatory Visit
Admission: RE | Admit: 2019-11-20 | Discharge: 2019-11-20 | Disposition: A | Discharge status: Home / Self Care | Payer: 59 | Source: Ambulatory Visit | Attending: Radiation Oncology | Admitting: Radiation Oncology

## 2019-11-20 ENCOUNTER — Other Ambulatory Visit: Payer: Self-pay

## 2019-11-20 DIAGNOSIS — C61 Malignant neoplasm of prostate: Secondary | ICD-10-CM | POA: Diagnosis not present

## 2019-11-21 ENCOUNTER — Other Ambulatory Visit: Payer: Self-pay

## 2019-11-21 ENCOUNTER — Ambulatory Visit
Admission: RE | Admit: 2019-11-21 | Discharge: 2019-11-21 | Disposition: A | Discharge status: Home / Self Care | Payer: 59 | Source: Ambulatory Visit | Attending: Radiation Oncology | Admitting: Radiation Oncology

## 2019-11-21 DIAGNOSIS — C61 Malignant neoplasm of prostate: Secondary | ICD-10-CM | POA: Diagnosis not present

## 2019-11-22 ENCOUNTER — Ambulatory Visit
Admission: RE | Admit: 2019-11-22 | Discharge: 2019-11-22 | Disposition: A | Discharge status: Home / Self Care | Payer: 59 | Source: Ambulatory Visit | Attending: Radiation Oncology | Admitting: Radiation Oncology

## 2019-11-22 ENCOUNTER — Other Ambulatory Visit: Payer: Self-pay

## 2019-11-22 DIAGNOSIS — C61 Malignant neoplasm of prostate: Secondary | ICD-10-CM | POA: Diagnosis not present

## 2019-11-25 ENCOUNTER — Ambulatory Visit: Payer: 59

## 2019-11-26 ENCOUNTER — Ambulatory Visit: Payer: 59

## 2019-11-26 DIAGNOSIS — C61 Malignant neoplasm of prostate: Secondary | ICD-10-CM | POA: Diagnosis not present

## 2019-11-27 ENCOUNTER — Ambulatory Visit: Payer: 59

## 2019-11-28 ENCOUNTER — Ambulatory Visit: Payer: 59

## 2019-11-29 ENCOUNTER — Ambulatory Visit: Payer: 59

## 2019-12-02 ENCOUNTER — Ambulatory Visit: Payer: 59

## 2019-12-02 ENCOUNTER — Telehealth: Payer: Self-pay | Admitting: Radiation Oncology

## 2019-12-02 NOTE — Telephone Encounter (Signed)
Since it has been almost a month since his last round of antibiotics and his last U/A and culture were normal, I would advise that he give another urine specimen and start Pyridium 200mg  po q6hours prn until culture results are available. Ailene Ards

## 2019-12-02 NOTE — Telephone Encounter (Signed)
Patient return this RN's call. Patient reports increasing burning with urination and penile pain when walking that has become increasingly worse in the last week. Patient states, "that antibiotic Dr. Tammi Klippel gave me before (Cipro) took the edge off a little bit." Patient requesting a prescription for another antibiotic to manage his dysuria. Patient reports urinary leakage, frequency and urgency has also increased. Reports nocturia every hour. Denies hematuria. Denies low back pain or a history of kidney stones. Reports extreme fatigue. Patient diagnosed with COVID on 12/4. Patient reports a productive cough with clear phlegm. Reports routine tylenol has managed to keep his temperature between 98-99. Denies taking any medication at this time. Reports stopping the vitamin d and calcium he was prescribed due to GI upset. Patient reports using [facility] on [address]. Patient understands this RN will discuss these findings with Dr. Tammi Klippel and phone him back with further directions.

## 2019-12-02 NOTE — Telephone Encounter (Signed)
Received voicemail message from patient requesting "Dr. Tammi Klippel call in antibiotics cause I am burning when I pee." Phoned patient back twice. No answer either time. Left voicemail message with my direct number requesting a return call to obtain further details. Awaiting call back.

## 2019-12-03 ENCOUNTER — Ambulatory Visit: Payer: 59

## 2019-12-03 ENCOUNTER — Other Ambulatory Visit: Payer: Self-pay | Admitting: Urology

## 2019-12-03 MED ORDER — NITROFURANTOIN MONOHYD MACRO 100 MG PO CAPS: 100.0000 mg | ORAL_CAPSULE | Freq: Two times a day (BID) | ORAL | 0 refills | Status: DC

## 2019-12-03 MED ORDER — PHENAZOPYRIDINE HCL 200 MG PO TABS: 200.0000 mg | ORAL_TABLET | Freq: Three times a day (TID) | ORAL | 0 refills | Status: DC | PRN

## 2019-12-03 NOTE — Telephone Encounter (Signed)
Please inform patient that I sent a Rx for Macrodantin and Pyridium to his pharmacy to treat empirically since he is currently on treatment break due to recent dx of COVID-19. Try Pyridium instead of AZO to see if this better manages the dysuria.  I am not entirely optimistic given his previous normal u/a and culture in 10/2019.  If his sxs persist despite completing this treatment, he will need to follow up with [name] for further evaluation, possibly for cystoscopy to r/o urethral stricture given his h/o RALP and persistent LUTS that were present even prior to XRT.  Nicholos Johns, MMS, PA-C [place] [facility] [place]: [phone]  Fax: [phone]

## 2019-12-04 ENCOUNTER — Ambulatory Visit: Payer: 59

## 2019-12-04 ENCOUNTER — Telehealth: Payer: Self-pay | Admitting: Radiation Oncology

## 2019-12-04 NOTE — Telephone Encounter (Signed)
Phoned patient. No answer. Left voicemail message explaining scripts had been sent to his pharmacy on [address]. Explained if these medications did not resolve his dysuria to follow up with his urologist, [name]. Provided my direct number for call back with questions.

## 2019-12-05 ENCOUNTER — Ambulatory Visit: Payer: 59

## 2019-12-05 DIAGNOSIS — C61 Malignant neoplasm of prostate: Secondary | ICD-10-CM | POA: Diagnosis not present

## 2019-12-06 ENCOUNTER — Ambulatory Visit: Payer: 59

## 2019-12-08 ENCOUNTER — Ambulatory Visit: Payer: 59

## 2019-12-09 ENCOUNTER — Ambulatory Visit: Payer: 59

## 2019-12-10 ENCOUNTER — Ambulatory Visit: Payer: 59

## 2019-12-11 ENCOUNTER — Ambulatory Visit: Payer: 59

## 2019-12-12 ENCOUNTER — Ambulatory Visit: Payer: 59

## 2019-12-16 ENCOUNTER — Ambulatory Visit: Payer: 59

## 2019-12-16 ENCOUNTER — Telehealth: Payer: Self-pay | Admitting: Radiation Oncology

## 2019-12-16 NOTE — Telephone Encounter (Signed)
Noted patient is scheduled to resume IMRT tomorrow after a break due to [place]. Phoned patient to inquire about status. No answer. Left voicemail message detailing appointment date and time. Encouraged patient to phone this RN with an update and provided direct number.

## 2019-12-17 ENCOUNTER — Telehealth: Payer: Self-pay | Admitting: Radiation Oncology

## 2019-12-17 ENCOUNTER — Other Ambulatory Visit: Payer: Self-pay

## 2019-12-17 ENCOUNTER — Ambulatory Visit
Admission: RE | Admit: 2019-12-17 | Discharge: 2019-12-17 | Disposition: A | Discharge status: Home / Self Care | Payer: 59 | Source: Ambulatory Visit | Attending: Radiation Oncology | Admitting: Radiation Oncology

## 2019-12-17 DIAGNOSIS — C61 Malignant neoplasm of prostate: Secondary | ICD-10-CM | POA: Diagnosis not present

## 2019-12-17 NOTE — Telephone Encounter (Signed)
Phoned patient to inquire about status. Patient reports feeling well without fever, cough or diarrhea. Patient explains his strength is slowly returning. Patient confirmed 4 pm radiation treatment appointment for today. Informed Faith, RT on L3 of these findings.

## 2019-12-18 ENCOUNTER — Ambulatory Visit
Admission: RE | Admit: 2019-12-18 | Discharge: 2019-12-18 | Disposition: A | Discharge status: Home / Self Care | Payer: 59 | Source: Ambulatory Visit | Attending: Radiation Oncology | Admitting: Radiation Oncology

## 2019-12-18 ENCOUNTER — Other Ambulatory Visit: Payer: Self-pay

## 2019-12-18 DIAGNOSIS — C61 Malignant neoplasm of prostate: Secondary | ICD-10-CM | POA: Diagnosis not present

## 2019-12-19 ENCOUNTER — Ambulatory Visit
Admission: RE | Admit: 2019-12-19 | Discharge: 2019-12-19 | Disposition: A | Discharge status: Home / Self Care | Payer: 59 | Source: Ambulatory Visit | Attending: Radiation Oncology | Admitting: Radiation Oncology

## 2019-12-19 DIAGNOSIS — C61 Malignant neoplasm of prostate: Secondary | ICD-10-CM | POA: Diagnosis not present

## 2019-12-23 ENCOUNTER — Other Ambulatory Visit: Payer: Self-pay

## 2019-12-23 ENCOUNTER — Ambulatory Visit
Admission: RE | Admit: 2019-12-23 | Discharge: 2019-12-23 | Disposition: A | Discharge status: Home / Self Care | Payer: 59 | Source: Ambulatory Visit | Attending: Radiation Oncology | Admitting: Radiation Oncology

## 2019-12-23 DIAGNOSIS — R300 Dysuria: Secondary | ICD-10-CM | POA: Diagnosis present

## 2019-12-23 DIAGNOSIS — C61 Malignant neoplasm of prostate: Secondary | ICD-10-CM | POA: Diagnosis not present

## 2019-12-24 ENCOUNTER — Other Ambulatory Visit: Payer: Self-pay

## 2019-12-24 ENCOUNTER — Ambulatory Visit: Payer: 59

## 2019-12-24 ENCOUNTER — Ambulatory Visit
Admission: RE | Admit: 2019-12-24 | Discharge: 2019-12-24 | Disposition: A | Discharge status: Home / Self Care | Payer: 59 | Source: Ambulatory Visit | Attending: Radiation Oncology | Admitting: Radiation Oncology

## 2019-12-24 DIAGNOSIS — C61 Malignant neoplasm of prostate: Secondary | ICD-10-CM | POA: Diagnosis not present

## 2019-12-25 ENCOUNTER — Ambulatory Visit
Admission: RE | Admit: 2019-12-25 | Discharge: 2019-12-25 | Disposition: A | Discharge status: Home / Self Care | Payer: 59 | Source: Ambulatory Visit | Attending: Radiation Oncology | Admitting: Radiation Oncology

## 2019-12-25 ENCOUNTER — Ambulatory Visit: Payer: 59

## 2019-12-25 ENCOUNTER — Other Ambulatory Visit: Payer: Self-pay

## 2019-12-25 DIAGNOSIS — C61 Malignant neoplasm of prostate: Secondary | ICD-10-CM | POA: Diagnosis not present

## 2019-12-26 ENCOUNTER — Encounter: Payer: Self-pay | Admitting: Medical Oncology

## 2019-12-26 ENCOUNTER — Ambulatory Visit
Admission: RE | Admit: 2019-12-26 | Discharge: 2019-12-26 | Disposition: A | Discharge status: Home / Self Care | Payer: 59 | Source: Ambulatory Visit | Attending: Radiation Oncology | Admitting: Radiation Oncology

## 2019-12-26 ENCOUNTER — Other Ambulatory Visit: Payer: Self-pay

## 2019-12-26 DIAGNOSIS — C61 Malignant neoplasm of prostate: Secondary | ICD-10-CM | POA: Diagnosis not present

## 2019-12-27 ENCOUNTER — Other Ambulatory Visit: Payer: Self-pay

## 2019-12-27 ENCOUNTER — Ambulatory Visit: Payer: 59

## 2019-12-27 ENCOUNTER — Ambulatory Visit
Admission: RE | Admit: 2019-12-27 | Discharge: 2019-12-27 | Disposition: A | Discharge status: Home / Self Care | Payer: 59 | Source: Ambulatory Visit | Attending: Radiation Oncology | Admitting: Radiation Oncology

## 2019-12-27 DIAGNOSIS — C61 Malignant neoplasm of prostate: Secondary | ICD-10-CM | POA: Diagnosis not present

## 2019-12-30 ENCOUNTER — Other Ambulatory Visit: Payer: Self-pay

## 2019-12-30 ENCOUNTER — Ambulatory Visit
Admission: RE | Admit: 2019-12-30 | Discharge: 2019-12-30 | Disposition: A | Discharge status: Home / Self Care | Payer: 59 | Source: Ambulatory Visit | Attending: Radiation Oncology | Admitting: Radiation Oncology

## 2019-12-30 DIAGNOSIS — C61 Malignant neoplasm of prostate: Secondary | ICD-10-CM | POA: Diagnosis not present

## 2019-12-31 ENCOUNTER — Ambulatory Visit: Payer: 59

## 2019-12-31 ENCOUNTER — Ambulatory Visit
Admission: RE | Admit: 2019-12-31 | Discharge: 2019-12-31 | Disposition: A | Discharge status: Home / Self Care | Payer: 59 | Source: Ambulatory Visit | Attending: Radiation Oncology | Admitting: Radiation Oncology

## 2019-12-31 ENCOUNTER — Other Ambulatory Visit: Payer: Self-pay

## 2019-12-31 DIAGNOSIS — C61 Malignant neoplasm of prostate: Secondary | ICD-10-CM | POA: Diagnosis not present

## 2020-01-01 ENCOUNTER — Other Ambulatory Visit: Payer: Self-pay

## 2020-01-01 ENCOUNTER — Ambulatory Visit
Admission: RE | Admit: 2020-01-01 | Discharge: 2020-01-01 | Disposition: A | Discharge status: Home / Self Care | Payer: 59 | Source: Ambulatory Visit | Attending: Radiation Oncology | Admitting: Radiation Oncology

## 2020-01-01 ENCOUNTER — Ambulatory Visit: Payer: 59

## 2020-01-01 DIAGNOSIS — C61 Malignant neoplasm of prostate: Secondary | ICD-10-CM | POA: Diagnosis not present

## 2020-01-02 ENCOUNTER — Ambulatory Visit: Payer: 59

## 2020-01-02 ENCOUNTER — Ambulatory Visit
Admission: RE | Admit: 2020-01-02 | Discharge: 2020-01-02 | Disposition: A | Discharge status: Home / Self Care | Payer: 59 | Source: Ambulatory Visit | Attending: Radiation Oncology | Admitting: Radiation Oncology

## 2020-01-02 ENCOUNTER — Other Ambulatory Visit: Payer: Self-pay

## 2020-01-02 DIAGNOSIS — C61 Malignant neoplasm of prostate: Secondary | ICD-10-CM | POA: Diagnosis not present

## 2020-01-03 ENCOUNTER — Ambulatory Visit
Admission: RE | Admit: 2020-01-03 | Discharge: 2020-01-03 | Disposition: A | Discharge status: Home / Self Care | Payer: 59 | Source: Ambulatory Visit | Attending: Radiation Oncology | Admitting: Radiation Oncology

## 2020-01-03 ENCOUNTER — Ambulatory Visit: Payer: 59

## 2020-01-03 DIAGNOSIS — C61 Malignant neoplasm of prostate: Secondary | ICD-10-CM | POA: Diagnosis not present

## 2020-01-06 ENCOUNTER — Ambulatory Visit
Admission: RE | Admit: 2020-01-06 | Discharge: 2020-01-06 | Disposition: A | Discharge status: Home / Self Care | Payer: 59 | Source: Ambulatory Visit | Attending: Radiation Oncology | Admitting: Radiation Oncology

## 2020-01-06 ENCOUNTER — Ambulatory Visit: Payer: 59

## 2020-01-06 DIAGNOSIS — C61 Malignant neoplasm of prostate: Secondary | ICD-10-CM | POA: Diagnosis not present

## 2020-01-07 ENCOUNTER — Ambulatory Visit
Admission: RE | Admit: 2020-01-07 | Discharge: 2020-01-07 | Disposition: A | Discharge status: Home / Self Care | Payer: 59 | Source: Ambulatory Visit | Attending: Radiation Oncology | Admitting: Radiation Oncology

## 2020-01-07 ENCOUNTER — Ambulatory Visit: Payer: 59

## 2020-01-07 DIAGNOSIS — C61 Malignant neoplasm of prostate: Secondary | ICD-10-CM | POA: Diagnosis not present

## 2020-01-08 ENCOUNTER — Ambulatory Visit
Admission: RE | Admit: 2020-01-08 | Discharge: 2020-01-08 | Disposition: A | Discharge status: Home / Self Care | Payer: 59 | Source: Ambulatory Visit | Attending: Radiation Oncology | Admitting: Radiation Oncology

## 2020-01-08 ENCOUNTER — Ambulatory Visit: Payer: 59

## 2020-01-08 ENCOUNTER — Other Ambulatory Visit: Payer: Self-pay

## 2020-01-08 DIAGNOSIS — C61 Malignant neoplasm of prostate: Secondary | ICD-10-CM | POA: Diagnosis not present

## 2020-01-09 ENCOUNTER — Ambulatory Visit
Admission: RE | Admit: 2020-01-09 | Discharge: 2020-01-09 | Disposition: A | Discharge status: Home / Self Care | Payer: 59 | Source: Ambulatory Visit | Attending: Radiation Oncology | Admitting: Radiation Oncology

## 2020-01-09 ENCOUNTER — Ambulatory Visit: Payer: 59

## 2020-01-09 ENCOUNTER — Other Ambulatory Visit: Payer: Self-pay

## 2020-01-09 DIAGNOSIS — C61 Malignant neoplasm of prostate: Secondary | ICD-10-CM | POA: Diagnosis not present

## 2020-01-10 ENCOUNTER — Other Ambulatory Visit: Payer: Self-pay

## 2020-01-10 ENCOUNTER — Ambulatory Visit
Admission: RE | Admit: 2020-01-10 | Discharge: 2020-01-10 | Disposition: A | Discharge status: Home / Self Care | Payer: 59 | Source: Ambulatory Visit | Attending: Radiation Oncology | Admitting: Radiation Oncology

## 2020-01-10 ENCOUNTER — Ambulatory Visit: Payer: 59

## 2020-01-10 DIAGNOSIS — C61 Malignant neoplasm of prostate: Secondary | ICD-10-CM | POA: Diagnosis not present

## 2020-01-13 ENCOUNTER — Ambulatory Visit: Payer: 59

## 2020-01-13 ENCOUNTER — Other Ambulatory Visit: Payer: Self-pay

## 2020-01-13 ENCOUNTER — Ambulatory Visit
Admission: RE | Admit: 2020-01-13 | Discharge: 2020-01-13 | Disposition: A | Discharge status: Home / Self Care | Payer: 59 | Source: Ambulatory Visit | Attending: Radiation Oncology | Admitting: Radiation Oncology

## 2020-01-13 DIAGNOSIS — C61 Malignant neoplasm of prostate: Secondary | ICD-10-CM | POA: Diagnosis not present

## 2020-01-14 ENCOUNTER — Ambulatory Visit: Payer: 59

## 2020-01-15 ENCOUNTER — Ambulatory Visit
Admission: RE | Admit: 2020-01-15 | Discharge: 2020-01-15 | Disposition: A | Discharge status: Home / Self Care | Payer: 59 | Source: Ambulatory Visit | Attending: Radiation Oncology | Admitting: Radiation Oncology

## 2020-01-15 ENCOUNTER — Other Ambulatory Visit: Payer: Self-pay

## 2020-01-15 ENCOUNTER — Encounter: Payer: Self-pay | Admitting: Radiation Oncology

## 2020-01-15 DIAGNOSIS — C61 Malignant neoplasm of prostate: Secondary | ICD-10-CM | POA: Diagnosis not present

## 2020-01-18 NOTE — Progress Notes (Signed)
  Radiation Oncology         [phone]) [phone] ________________________________  Name: Juan Richards MRN: [id number]  Date: 01/15/2020  DOB: [birth year]  End of Treatment Note  Diagnosis:    64 y.o. gentleman with a biochemical recurrence of prostate cancer with a postoperative PSA of 0.61 s/p RALP in 09/2018 for Stage pT3b, Gleason 4+3 disease.     Indication for treatment:  Curative, Definitive Radiotherapy       Radiation treatment dates:   10/29/19 to 01/15/20 -- with interruption from 12/4-12/29 due to COVID infection  Site/dose:  1. The prostate fossa and pelvic lymph nodes were initially treated to 45 Gy in 25 fractions of 1.8 Gy  2. The prostate fossa only was boosted to 68.4 Gy with 13 additional fractions of 1.8 Gy   Beams/energy:  1. The prostate fossa  and pelvic lymph nodes were initially treated using VMAT intensity modulated radiotherapy delivering 6 megavolt photons. Image guidance was performed with CB-CT studies prior to each fraction. He was immobilized with a body fix lower extremity mold.  2. The prostate fossa only was boosted using VMAT intensity modulated radiotherapy delivering 6 megavolt photons. Image guidance was performed with CB-CT studies prior to each fraction. He was immobilized with a body fix lower extremity mold.  Narrative: The patient tolerated radiation treatment relatively well.   The patient experienced some minor urinary irritation and modest fatigue.  He had dysuria and was treated empirically for UTI.  He was diagnosed with COVID infection and was put on break from radiation from 12/4 to 12/29 to allow for recovery.  Plan: The patient has completed radiation treatment. He will return to radiation oncology clinic for routine followup in one month. I advised him to call or return sooner if he has any questions or concerns related to his recovery or treatment. ________________________________  Sheral Apley. Tammi Klippel, M.D.

## 2020-01-20 DIAGNOSIS — C181 Malignant neoplasm of appendix: Secondary | ICD-10-CM

## 2020-01-20 HISTORY — DX: Malignant neoplasm of appendix: C18.1

## 2020-01-24 ENCOUNTER — Other Ambulatory Visit: Payer: Self-pay

## 2020-01-24 ENCOUNTER — Encounter: Payer: Self-pay | Admitting: Physician Assistant

## 2020-01-24 ENCOUNTER — Ambulatory Visit: Payer: 59 | Admitting: Physician Assistant

## 2020-01-24 ENCOUNTER — Other Ambulatory Visit (INDEPENDENT_AMBULATORY_CARE_PROVIDER_SITE_OTHER): Payer: 59

## 2020-01-24 ENCOUNTER — Ambulatory Visit (INDEPENDENT_AMBULATORY_CARE_PROVIDER_SITE_OTHER)
Admission: RE | Admit: 2020-01-24 | Discharge: 2020-01-24 | Disposition: A | Discharge status: Home / Self Care | Payer: 59 | Source: Ambulatory Visit | Attending: Physician Assistant | Admitting: Physician Assistant

## 2020-01-24 VITALS — BP 110/70 | HR 60 | Temp 97.6°F | Ht 70.0 in | Wt 205.4 lb

## 2020-01-24 DIAGNOSIS — R112 Nausea with vomiting, unspecified: Secondary | ICD-10-CM | POA: Diagnosis not present

## 2020-01-24 DIAGNOSIS — K5900 Constipation, unspecified: Secondary | ICD-10-CM | POA: Diagnosis not present

## 2020-01-24 DIAGNOSIS — R140 Abdominal distension (gaseous): Secondary | ICD-10-CM | POA: Diagnosis not present

## 2020-01-24 DIAGNOSIS — R948 Abnormal results of function studies of other organs and systems: Secondary | ICD-10-CM | POA: Diagnosis not present

## 2020-01-24 LAB — COMPREHENSIVE METABOLIC PANEL
ALT: 16 U/L (ref 0–53)
AST: 15 U/L (ref 0–37)
Albumin: 4.3 g/dL (ref 3.5–5.2)
Alkaline Phosphatase: 56 U/L (ref 39–117)
BUN: 17 mg/dL (ref 6–23)
CO2: 28 mEq/L (ref 19–32)
Calcium: 10.1 mg/dL (ref 8.4–10.5)
Chloride: 97 mEq/L (ref 96–112)
Creatinine, Ser: 0.75 mg/dL (ref 0.40–1.50)
GFR: 126.83 mL/min (ref >60.00)
Glucose, Bld: 101 mg/dL — ABNORMAL HIGH (ref 70–99)
Potassium: 3.6 mEq/L (ref 3.5–5.1)
Sodium: 134 mEq/L — ABNORMAL LOW (ref 135–145)
Total Bilirubin: 0.7 mg/dL (ref 0.2–1.2)
Total Protein: 8.3 g/dL (ref 6.0–8.3)

## 2020-01-24 LAB — CBC WITH DIFFERENTIAL/PLATELET
Basophils Absolute: 0.1 10*3/uL (ref 0.0–0.1)
Basophils Relative: 1.2 % (ref 0.0–3.0)
Eosinophils Absolute: 0 10*3/uL (ref 0.0–0.7)
Eosinophils Relative: 0.2 % (ref 0.0–5.0)
HCT: 34.8 % — ABNORMAL LOW (ref 39.0–52.0)
Hemoglobin: 11.9 g/dL — ABNORMAL LOW (ref 13.0–17.0)
Lymphocytes Relative: 24.8 % (ref 12.0–46.0)
Lymphs Abs: 1.2 10*3/uL (ref 0.7–4.0)
MCHC: 34.3 g/dL (ref 30.0–36.0)
MCV: 89.5 fl (ref 78.0–100.0)
Monocytes Absolute: 0.5 10*3/uL (ref 0.1–1.0)
Monocytes Relative: 10.7 % (ref 3.0–12.0)
Neutro Abs: 2.9 10*3/uL (ref 1.4–7.7)
Neutrophils Relative %: 63.1 % (ref 43.0–77.0)
Platelets: 363 10*3/uL (ref 150.0–400.0)
RBC: 3.89 Mil/uL — ABNORMAL LOW (ref 4.22–5.81)
RDW: 14.1 % (ref 11.5–15.5)
WBC: 4.7 10*3/uL (ref 4.0–10.5)

## 2020-01-24 NOTE — Patient Instructions (Addendum)
If you are age 64 or older, your body mass index should be between 23-30. Your Body mass index is 29.47 kg/m. If this is out of the aforementioned range listed, please consider follow up with your Primary Care Provider.  If you are age 65 or younger, your body mass index should be between 19-25. Your Body mass index is 29.47 kg/m. If this is out of the aformentioned range listed, please consider follow up with your Primary Care Provider.   Your provider has requested that you have an abdominal x ray before leaving today. Please go to the basement floor to our Radiology department for the test.  Your provider has requested that you go to the basement level for lab work before leaving today. Press "B" on the elevator. The lab is located at the first door on the left as you exit the elevator.

## 2020-01-24 NOTE — Progress Notes (Signed)
Reviewed and agree with management plan.  Latoia Eyster T. Madigan Rosensteel, MD FACG [facility]  

## 2020-01-24 NOTE — Progress Notes (Signed)
Chief Complaint: Diarrhea/constipation  HPI:    Juan Richards is a 64 year old African-American male, known to Dr. Fuller Plan, with a past medical history as listed below, including prostate cancer s/p prostatectomy who recently finished his final radiation appointment, who was referred to me by [name], MD for a complaint of diarrhea and constipation.    09/17/2019 colonoscopy for personal history of adenomatous polyps with 3 5-8 mm polyps in the rectum and in the sigmoid colon, severe diverticulosis without diverticulitis in the left colon, stricture in the sigmoid colon, internal hemorrhoids and otherwise normal.  Patient was given Cipro and Flagyl for 10 days.    10/15/2019 patient had a PET scan which showed sigmoid diverticulosis with evidence of acute diverticulitis and small contained perforation.    Today, the patient presents to clinic and explains that he started radiation for his prostate cancer back in October and a week after having his first treatment he started with lower abdominal pain and urgent loose stools.  Tells me that he try to talk to his physicians about this but they never really heard him.  He then had his last few treatments a couple of weeks ago and tells me they were targeting a different area of his prostate and ever since that became constipated.  Tells me he has not really been able to have a good bowel movement on his own at all.  Tells me that over the past week he has not been eating either because he only passes a small amount of stool every 2 to 3 days and is nauseous and even has had some episodes of vomiting when trying to eat recently.  Tells me yesterday he was able to get in a part of a baked potato but it felt like it just sat in his stomach and then shortly thereafter started with intense sharp pains which radiated across his abdomen into his left side.  Tells me his stomach has been increasing in distention and he hears a lot of gurgling in there and it has also  become very sore.  Tells me he took a fleets enema this morning because he was not even passing any gas and he did get some watery discharge but tells me he does not feel completely empty.  Since this morning he still has not passed any further gas.  He is worried that something being "stopped up" in there.    Of note, reviewed the PET scan above with him from the end of October, there was sigmoid diverticulosis with evidence of acute diverticulitis and perforation, patient tells me he was never retreated for diverticulitis.    Denies fever, chills or blood in his stool.  Past Medical History:  Diagnosis Date  . DIVERTICULITIS, COLON, WITH PERFORATION 07/23/2009  . Numbness and tingling    RIGHT TOES AND LEFT LEG DUE TO L 5 DISC   . PLEURISY YRS AGO  . Prostate cancer ([facility])   . 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 BACK SURGERY  2017   L4 BACK SURGERY  . COLONOSCOPY    . PELVIC LYMPH NODE DISSECTION Bilateral 09/21/2018   Procedure: BILATERAL PELVIC LYMPH NODE DISSECTION;  Surgeon: Ardis Hughs, MD;  Location: [facility];  Service: Urology;  Laterality: Bilateral;  . POLYPECTOMY    . ROBOT ASSISTED LAPAROSCOPIC RADICAL PROSTATECTOMY N/A 09/21/2018   Procedure: XI ROBOTIC ASSISTED LAPAROSCOPIC RADICAL PROSTATECTOMY;  Surgeon: Ardis Hughs, MD;  Location:  [facility];  Service: Urology;  Laterality: N/A;    Current Outpatient Medications  Medication Sig Dispense Refill  . calcium carbonate (OS-CAL - DOSED IN MG OF ELEMENTAL CALCIUM) 1250 (500 Ca) MG tablet Take 1 tablet by mouth.    . nitrofurantoin, macrocrystal-monohydrate, (MACROBID) 100 MG capsule Take 1 capsule (100 mg total) by mouth 2 (two) times daily. 20 capsule 0  . phenazopyridine (PYRIDIUM) 200 MG tablet Take 1 tablet (200 mg total) by mouth 3 (three) times daily as needed for pain. 30 tablet 0  . sildenafil (VIAGRA) 100 MG tablet Take 100 mg by mouth daily.    . Vitamin D,  Ergocalciferol, (DRISDOL) 1.25 MG (50000 UT) CAPS capsule Take 1 capsule (50,000 Units total) by mouth every 7 (seven) days. (Patient not taking: Reported on 12/02/2019) 12 capsule 0   No current facility-administered medications for this visit.    Allergies as of 01/24/2020  . (No Known Allergies)    Family History  Problem Relation Age of Onset  . Diabetes Mother   . Kidney disease Mother   . Cancer Father        ?  . Diabetes Maternal Grandmother   . Colon cancer Brother   . Esophageal cancer Neg Hx   . Rectal cancer Neg Hx   . Stomach cancer Neg Hx     Social History   Socioeconomic History  . Marital status: Married    Spouse name: Not on file  . Number of children: 1  . Years of education: 66  . Highest education level: Not on file  Occupational History  . Occupation: Production assistant, radio  Tobacco Use  . Smoking status: Never Smoker  . Smokeless tobacco: Never Used  Substance and Sexual Activity  . Alcohol use: Yes    Alcohol/week: 2.0 - 3.0 standard drinks    Types: 2 - 3 Cans of beer per week    Comment: OCC  . Drug use: No  . Sexual activity: Yes  Other Topics Concern  . Not on file  Social History Narrative   Fun: Watch TV   Social Determinants of Health   Financial Resource Strain:   . Difficulty of Paying Living Expenses: Not on file  Food Insecurity:   . Worried About Charity fundraiser in the Last Year: Not on file  . Ran Out of Food in the Last Year: Not on file  Transportation Needs:   . Lack of Transportation (Medical): Not on file  . Lack of Transportation (Non-Medical): Not on file  Physical Activity:   . Days of Exercise per Week: Not on file  . Minutes of Exercise per Session: Not on file  Stress:   . Feeling of Stress : Not on file  Social Connections:   . Frequency of Communication with Friends and Family: Not on file  . Frequency of Social Gatherings with Friends and Family: Not on file  . Attends Religious Services: Not on  file  . Active Member of Clubs or Organizations: Not on file  . Attends Archivist Meetings: Not on file  . Marital Status: Not on file  Intimate Partner Violence:   . Fear of Current or Ex-Partner: Not on file  . Emotionally Abused: Not on file  . Physically Abused: Not on file  . Sexually Abused: Not on file    Review of Systems:    Constitutional: No weight loss, fever or chills Cardiovascular: No chest pain Respiratory: No SOB  Gastrointestinal: See HPI and  otherwise negative   Physical Exam:  Vital signs: BP 110/70   Pulse 60   Temp 97.6 F (36.4 C)   Ht 5\' 10"  (1.778 m)   Wt 205 lb 6 oz (93.2 kg)   BMI 29.47 kg/m   Constitutional:   Pleasant AA male appears to be in NAD, Well developed, Well nourished, alert and cooperative Respiratory: Respirations even and unlabored. Lungs clear to auscultation bilaterally.   No wheezes, crackles, or rhonchi.  Cardiovascular: Normal S1, S2. No MRG. Regular rate and rhythm. No peripheral edema, cyanosis or pallor.  Gastrointestinal: Tense, Moderate distension, Decreased BS all four quadrants, Mild generalized ttp, some worse in LLQ. No rebound or guarding. . No appreciable masses or hepatomegaly. Psychiatric: Demonstrates good judgement and reason without abnormal affect or behaviors.  RELEVANT LABS AND IMAGING: CBC    Component Value Date/Time   WBC 8.4 10/29/2019 1519   RBC 4.36 10/29/2019 1519   HGB 13.0 10/29/2019 1519   HCT 39.3 10/29/2019 1519   PLT 241.0 10/29/2019 1519   MCV 90.1 10/29/2019 1519   MCV 88.7 06/27/2017 1723   MCH 30.1 09/22/2018 0525   MCHC 33.1 10/29/2019 1519   RDW 13.4 10/29/2019 1519   LYMPHSABS 2.9 10/29/2019 1519   MONOABS 0.7 10/29/2019 1519   EOSABS 0.1 10/29/2019 1519   BASOSABS 0.1 10/29/2019 1519    CMP     Component Value Date/Time   NA 136 10/29/2019 1519   K 4.0 10/29/2019 1519   CL 102 10/29/2019 1519   CO2 28 10/29/2019 1519   GLUCOSE 83 10/29/2019 1519   BUN 10  10/29/2019 1519   CREATININE 0.80 10/29/2019 1519   CALCIUM 9.8 10/29/2019 1519   PROT 7.8 10/29/2019 1519   ALBUMIN 4.4 10/29/2019 1519   AST 12 10/29/2019 1519   ALT 14 10/29/2019 1519   ALKPHOS 57 10/29/2019 1519   BILITOT 0.5 10/29/2019 1519   GFRNONAA >60 09/22/2018 0525   GFRAA >60 09/22/2018 0525    Assessment: 1.  Constipation: Severe, to the point where patient is unable to eat any more food or drink much of anything, has not had a good bowel movement over the past week, did use an enema this morning with some watery discharge, has not passed gas since then and very limited prior to that, history of prostatectomy and status post radiation recently ; concern for bowel obstruction versus other  2.  Abdominal distention 3.  Nausea and vomiting 4.  Abnormal PET scan of the abdomen: Patient treated for diverticulitis after colonoscopy in September, PET scan at the end of October showed diverticulitis with perforation, never retreated  Plan: 1.  Ordered labs including CBC and CMP. 2.  Ordered 2 view abdominal x-ray to check for obstruction 3.  Discussed the patient if labs and imaging above are normal would recommend a CT of the abdomen pelvis with contrast for further eval. 4.  For now would recommend the patient drink fluids as much as he can but try not to eat until he hears from [place]. 5.  Patient to follow in clinic recommendations after imaging and labs above.  Ellouise Newer, PA-C [facility] 01/24/2020, 1:33 PM  Cc: [name], MD

## 2020-01-25 ENCOUNTER — Encounter (HOSPITAL_COMMUNITY): Payer: Self-pay | Admitting: Emergency Medicine

## 2020-01-25 ENCOUNTER — Inpatient Hospital Stay (HOSPITAL_COMMUNITY)
Admission: EM | Admit: 2020-01-25 | Discharge: 2020-02-07 | DRG: 853 | Disposition: A | Discharge status: Home Health | Payer: 59 | Attending: Internal Medicine | Admitting: Internal Medicine

## 2020-01-25 ENCOUNTER — Telehealth: Payer: Self-pay | Admitting: Gastroenterology

## 2020-01-25 ENCOUNTER — Emergency Department (HOSPITAL_COMMUNITY): Payer: 59

## 2020-01-25 ENCOUNTER — Other Ambulatory Visit: Payer: Self-pay

## 2020-01-25 DIAGNOSIS — K631 Perforation of intestine (nontraumatic): Secondary | ICD-10-CM | POA: Diagnosis not present

## 2020-01-25 DIAGNOSIS — K5720 Diverticulitis of large intestine with perforation and abscess without bleeding: Secondary | ICD-10-CM | POA: Diagnosis present

## 2020-01-25 DIAGNOSIS — Z7189 Other specified counseling: Secondary | ICD-10-CM | POA: Diagnosis not present

## 2020-01-25 DIAGNOSIS — C786 Secondary malignant neoplasm of retroperitoneum and peritoneum: Secondary | ICD-10-CM | POA: Diagnosis present

## 2020-01-25 DIAGNOSIS — Z9079 Acquired absence of other genital organ(s): Secondary | ICD-10-CM

## 2020-01-25 DIAGNOSIS — R000 Tachycardia, unspecified: Secondary | ICD-10-CM | POA: Diagnosis not present

## 2020-01-25 DIAGNOSIS — K5792 Diverticulitis of intestine, part unspecified, without perforation or abscess without bleeding: Secondary | ICD-10-CM

## 2020-01-25 DIAGNOSIS — Z6828 Body mass index (BMI) 28.0-28.9, adult: Secondary | ICD-10-CM | POA: Diagnosis not present

## 2020-01-25 DIAGNOSIS — K659 Peritonitis, unspecified: Secondary | ICD-10-CM | POA: Diagnosis present

## 2020-01-25 DIAGNOSIS — Z923 Personal history of irradiation: Secondary | ICD-10-CM | POA: Diagnosis not present

## 2020-01-25 DIAGNOSIS — L988 Other specified disorders of the skin and subcutaneous tissue: Secondary | ICD-10-CM | POA: Diagnosis not present

## 2020-01-25 DIAGNOSIS — N390 Urinary tract infection, site not specified: Secondary | ICD-10-CM | POA: Diagnosis present

## 2020-01-25 DIAGNOSIS — Z841 Family history of disorders of kidney and ureter: Secondary | ICD-10-CM | POA: Diagnosis not present

## 2020-01-25 DIAGNOSIS — C61 Malignant neoplasm of prostate: Secondary | ICD-10-CM | POA: Diagnosis not present

## 2020-01-25 DIAGNOSIS — Z8546 Personal history of malignant neoplasm of prostate: Secondary | ICD-10-CM | POA: Diagnosis not present

## 2020-01-25 DIAGNOSIS — D709 Neutropenia, unspecified: Secondary | ICD-10-CM | POA: Diagnosis present

## 2020-01-25 DIAGNOSIS — D62 Acute posthemorrhagic anemia: Secondary | ICD-10-CM | POA: Diagnosis not present

## 2020-01-25 DIAGNOSIS — F329 Major depressive disorder, single episode, unspecified: Secondary | ICD-10-CM | POA: Diagnosis not present

## 2020-01-25 DIAGNOSIS — R935 Abnormal findings on diagnostic imaging of other abdominal regions, including retroperitoneum: Secondary | ICD-10-CM | POA: Diagnosis not present

## 2020-01-25 DIAGNOSIS — M62838 Other muscle spasm: Secondary | ICD-10-CM | POA: Diagnosis not present

## 2020-01-25 DIAGNOSIS — K56609 Unspecified intestinal obstruction, unspecified as to partial versus complete obstruction: Secondary | ICD-10-CM

## 2020-01-25 DIAGNOSIS — R5081 Fever presenting with conditions classified elsewhere: Secondary | ICD-10-CM | POA: Diagnosis present

## 2020-01-25 DIAGNOSIS — E440 Moderate protein-calorie malnutrition: Secondary | ICD-10-CM

## 2020-01-25 DIAGNOSIS — N3000 Acute cystitis without hematuria: Secondary | ICD-10-CM | POA: Diagnosis not present

## 2020-01-25 DIAGNOSIS — K567 Ileus, unspecified: Secondary | ICD-10-CM | POA: Diagnosis not present

## 2020-01-25 DIAGNOSIS — E871 Hypo-osmolality and hyponatremia: Secondary | ICD-10-CM | POA: Diagnosis not present

## 2020-01-25 DIAGNOSIS — Z515 Encounter for palliative care: Secondary | ICD-10-CM | POA: Diagnosis not present

## 2020-01-25 DIAGNOSIS — R7303 Prediabetes: Secondary | ICD-10-CM | POA: Diagnosis present

## 2020-01-25 DIAGNOSIS — C181 Malignant neoplasm of appendix: Secondary | ICD-10-CM | POA: Diagnosis present

## 2020-01-25 DIAGNOSIS — F419 Anxiety disorder, unspecified: Secondary | ICD-10-CM | POA: Diagnosis present

## 2020-01-25 DIAGNOSIS — A419 Sepsis, unspecified organism: Principal | ICD-10-CM | POA: Diagnosis present

## 2020-01-25 DIAGNOSIS — Z833 Family history of diabetes mellitus: Secondary | ICD-10-CM

## 2020-01-25 DIAGNOSIS — R6520 Severe sepsis without septic shock: Secondary | ICD-10-CM | POA: Diagnosis not present

## 2020-01-25 DIAGNOSIS — Z800 Family history of malignant neoplasm of digestive organs: Secondary | ICD-10-CM

## 2020-01-25 DIAGNOSIS — C762 Malignant neoplasm of abdomen: Secondary | ICD-10-CM

## 2020-01-25 DIAGNOSIS — Z4659 Encounter for fitting and adjustment of other gastrointestinal appliance and device: Secondary | ICD-10-CM

## 2020-01-25 DIAGNOSIS — Z20822 Contact with and (suspected) exposure to covid-19: Secondary | ICD-10-CM | POA: Diagnosis present

## 2020-01-25 DIAGNOSIS — N321 Vesicointestinal fistula: Secondary | ICD-10-CM | POA: Diagnosis present

## 2020-01-25 DIAGNOSIS — D630 Anemia in neoplastic disease: Secondary | ICD-10-CM | POA: Diagnosis present

## 2020-01-25 LAB — CBC WITH DIFFERENTIAL/PLATELET
Abs Immature Granulocytes: 0.02 10*3/uL (ref 0.00–0.07)
Basophils Absolute: 0 10*3/uL (ref 0.0–0.1)
Basophils Relative: 0 %
Eosinophils Absolute: 0 10*3/uL (ref 0.0–0.5)
Eosinophils Relative: 0 %
HCT: 37.9 % — ABNORMAL LOW (ref 39.0–52.0)
Hemoglobin: 12.7 g/dL — ABNORMAL LOW (ref 13.0–17.0)
Immature Granulocytes: 0 %
Lymphocytes Relative: 8 %
Lymphs Abs: 0.5 10*3/uL — ABNORMAL LOW (ref 0.7–4.0)
MCH: 30.2 pg (ref 26.0–34.0)
MCHC: 33.5 g/dL (ref 30.0–36.0)
MCV: 90.2 fL (ref 80.0–100.0)
Monocytes Absolute: 0.4 10*3/uL (ref 0.1–1.0)
Monocytes Relative: 6 %
Neutro Abs: 5.9 10*3/uL (ref 1.7–7.7)
Neutrophils Relative %: 86 %
Platelets: 417 10*3/uL — ABNORMAL HIGH (ref 150–400)
RBC: 4.2 MIL/uL — ABNORMAL LOW (ref 4.22–5.81)
RDW: 13.6 % (ref 11.5–15.5)
WBC: 6.8 10*3/uL (ref 4.0–10.5)
nRBC: 0 % (ref 0.0–0.2)

## 2020-01-25 LAB — COMPREHENSIVE METABOLIC PANEL
ALT: 45 U/L — ABNORMAL HIGH (ref 0–44)
AST: 39 U/L (ref 15–41)
Albumin: 3.9 g/dL (ref 3.5–5.0)
Alkaline Phosphatase: 64 U/L (ref 38–126)
Anion gap: 15 (ref 5–15)
BUN: 19 mg/dL (ref 8–23)
CO2: 24 mmol/L (ref 22–32)
Calcium: 10.3 mg/dL (ref 8.9–10.3)
Chloride: 94 mmol/L — ABNORMAL LOW (ref 98–111)
Creatinine, Ser: 0.93 mg/dL (ref 0.61–1.24)
GFR calc Af Amer: >60 mL/min (ref >60)
GFR calc non Af Amer: >60 mL/min (ref >60)
Glucose, Bld: 155 mg/dL — ABNORMAL HIGH (ref 70–99)
Potassium: 4 mmol/L (ref 3.5–5.1)
Sodium: 133 mmol/L — ABNORMAL LOW (ref 135–145)
Total Bilirubin: 1.4 mg/dL — ABNORMAL HIGH (ref 0.3–1.2)
Total Protein: 7.7 g/dL (ref 6.5–8.1)

## 2020-01-25 LAB — URINALYSIS, ROUTINE W REFLEX MICROSCOPIC
Glucose, UA: NEGATIVE mg/dL
Ketones, ur: 5 mg/dL — AB
Nitrite: NEGATIVE
Protein, ur: 30 mg/dL — AB
Specific Gravity, Urine: 1.03 (ref 1.005–1.030)
WBC, UA: >50 WBC/hpf — ABNORMAL HIGH (ref 0–5)
pH: 5 (ref 5.0–8.0)

## 2020-01-25 LAB — GLUCOSE, CAPILLARY
Glucose-Capillary: 115 mg/dL — ABNORMAL HIGH (ref 70–99)
Glucose-Capillary: 94 mg/dL (ref 70–99)
Glucose-Capillary: 128 mg/dL — ABNORMAL HIGH (ref 70–99)

## 2020-01-25 LAB — SARS CORONAVIRUS 2 (TAT 6-24 HRS): SARS Coronavirus 2: NEGATIVE

## 2020-01-25 MED ORDER — KETOROLAC TROMETHAMINE 30 MG/ML IJ SOLN
30.0000 mg | Freq: Four times a day (QID) | INTRAMUSCULAR | Status: AC | PRN
  Administered 2020-01-25 – 2020-01-30 (×6): 30 mg via INTRAVENOUS
  Filled 2020-01-25 (×6): qty 1

## 2020-01-25 MED ORDER — FENTANYL CITRATE (PF) 100 MCG/2ML IJ SOLN
50.0000 ug | Freq: Once | INTRAMUSCULAR | Status: AC
  Administered 2020-01-25: 50 ug via INTRAVENOUS
  Filled 2020-01-25: qty 2

## 2020-01-25 MED ORDER — ONDANSETRON HCL 4 MG/2ML IJ SOLN
4.0000 mg | Freq: Four times a day (QID) | INTRAMUSCULAR | Status: DC | PRN
  Administered 2020-01-29 – 2020-02-01 (×5): 4 mg via INTRAVENOUS
  Filled 2020-01-25 (×5): qty 2

## 2020-01-25 MED ORDER — ONDANSETRON HCL 4 MG/2ML IJ SOLN
4.0000 mg | Freq: Once | INTRAMUSCULAR | Status: AC
  Administered 2020-01-25: 4 mg via INTRAVENOUS
  Filled 2020-01-25: qty 2

## 2020-01-25 MED ORDER — MORPHINE SULFATE (PF) 4 MG/ML IV SOLN
4.0000 mg | Freq: Once | INTRAVENOUS | Status: AC
  Administered 2020-01-25: 4 mg via INTRAVENOUS
  Filled 2020-01-25: qty 1

## 2020-01-25 MED ORDER — SODIUM CHLORIDE 0.9 % IV BOLUS
500.0000 mL | Freq: Once | INTRAVENOUS | Status: AC
  Administered 2020-01-25: 500 mL via INTRAVENOUS

## 2020-01-25 MED ORDER — PIPERACILLIN-TAZOBACTAM 3.375 G IVPB
3.3750 g | Freq: Four times a day (QID) | INTRAVENOUS | Status: DC
  Administered 2020-01-25 – 2020-01-26 (×4): 3.375 g via INTRAVENOUS
  Filled 2020-01-25 (×4): qty 50

## 2020-01-25 MED ORDER — IOHEXOL 300 MG/ML  SOLN
100.0000 mL | Freq: Once | INTRAMUSCULAR | Status: AC | PRN
  Administered 2020-01-25: 100 mL via INTRAVENOUS

## 2020-01-25 MED ORDER — INSULIN ASPART 100 UNIT/ML ~~LOC~~ SOLN
0.0000 [IU] | Freq: Three times a day (TID) | SUBCUTANEOUS | Status: DC
  Administered 2020-01-26: 2 [IU] via SUBCUTANEOUS

## 2020-01-25 MED ORDER — SODIUM CHLORIDE 0.45 % IV SOLN: INTRAVENOUS | Status: AC

## 2020-01-25 MED ORDER — ENOXAPARIN SODIUM 40 MG/0.4ML ~~LOC~~ SOLN
40.0000 mg | SUBCUTANEOUS | Status: DC
  Administered 2020-01-25 – 2020-01-27 (×2): 40 mg via SUBCUTANEOUS
  Filled 2020-01-25 (×2): qty 0.4

## 2020-01-25 MED ORDER — PIPERACILLIN-TAZOBACTAM 3.375 G IVPB 30 MIN
3.3750 g | Freq: Once | INTRAVENOUS | Status: AC
  Administered 2020-01-25: 3.375 g via INTRAVENOUS
  Filled 2020-01-25: qty 50

## 2020-01-25 NOTE — ED Provider Notes (Signed)
[facility] EMERGENCY DEPARTMENT Provider Note   CSN: [id number] Arrival date & time: 01/25/20  [id number]     History Chief Complaint  Patient presents with  . Abdomen Swelling    Juan Richards is a 64 y.o. male.  The history is provided by the patient and medical records. No language interpreter was used.   Juan Richards is a 64 y.o. male who presents to the Emergency Department complaining of abdominal pain and swelling. He presents to the emergency department complaining of a week and a half of progressive abdominal pain and bloating. Symptoms initially started with constipation. He had associated cramping in his lower abdomen. Last night his cramping and swelling progressed and he developed multiple episodes of emesis. He was able to have a small amount of loose bowel movement yesterday. Denies any hematochezia or melena. His emesis is described as very dark in color. He has a history of prostatectomy as well as abdominal radiation. He saw gastroenterology yesterday and had labs and x-ray performed but does not have results from that visit.    Past Medical History:  Diagnosis Date  . DIVERTICULITIS, COLON, WITH PERFORATION 07/23/2009  . Numbness and tingling    RIGHT TOES AND LEFT LEG DUE TO L 5 DISC   . PLEURISY YRS AGO  . Prostate cancer ([place])   . 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 Diverticulitis of large intestine with perforation 01/25/2020  . SBO (small bowel obstruction) ([place]) 01/25/2020  . Prostate cancer ([place]) 09/21/2018  . Pre-diabetes 06/20/2018  . Routine general medical examination at a health care facility 08/11/2016  . DIVERTICULITIS, COLON, WITH PERFORATION 07/23/2009  . Gross hematuria 07/23/2009  . 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 EPIDIDYMAL CYST 08/31/2007    Past Surgical History:  Procedure Laterality Date  . BACK SURGERY  2017   L4 BACK SURGERY  . COLONOSCOPY    .  PELVIC LYMPH NODE DISSECTION Bilateral 09/21/2018   Procedure: BILATERAL PELVIC LYMPH NODE DISSECTION;  Surgeon: Ardis Hughs, MD;  Location: [facility];  Service: Urology;  Laterality: Bilateral;  . POLYPECTOMY    . ROBOT ASSISTED LAPAROSCOPIC RADICAL PROSTATECTOMY N/A 09/21/2018   Procedure: XI ROBOTIC ASSISTED LAPAROSCOPIC RADICAL PROSTATECTOMY;  Surgeon: Ardis Hughs, MD;  Location: [facility];  Service: Urology;  Laterality: N/A;       Family History  Problem Relation Age of Onset  . Diabetes Mother   . Kidney disease Mother   . Cancer Father        ?  . Diabetes Maternal Grandmother   . Colon cancer Brother   . Esophageal cancer Neg Hx   . Rectal cancer Neg Hx   . Stomach cancer Neg Hx     Social History   Tobacco Use  . Smoking status: Never Smoker  . Smokeless tobacco: Never Used  Substance Use Topics  . Alcohol use: Yes    Alcohol/week: 2.0 - 3.0 standard drinks    Types: 2 - 3 Cans of beer per week    Comment: OCC  . Drug use: No    Home Medications Prior to Admission medications   Not on File    Allergies    Patient has no known allergies.  Review of Systems   Review of Systems  All other systems reviewed and are negative.   Physical Exam Updated Vital Signs BP (!) 140/98 (BP Location: Right Arm)   Pulse  93   Temp 97.9 F (36.6 C) (Axillary)   Resp 18   Ht 5\' 9"  (1.753 m)   Wt 87.2 kg   SpO2 99%   BMI 28.39 kg/m   Physical Exam Vitals and nursing note reviewed.  Constitutional:      Appearance: He is well-developed.  HENT:     Head: Normocephalic and atraumatic.  Cardiovascular:     Rate and Rhythm: Regular rhythm. Tachycardia present.     Heart sounds: No murmur.  Pulmonary:     Effort: Pulmonary effort is normal. No respiratory distress.     Breath sounds: Normal breath sounds.  Abdominal:     General: There is distension.     Palpations: Abdomen is soft.     Tenderness: There is abdominal tenderness. There is no guarding or  rebound.     Comments: Mild generalized abdominal tenderness.  Musculoskeletal:        General: No tenderness.  Skin:    General: Skin is warm and dry.  Neurological:     Mental Status: He is alert and oriented to person, place, and time.  Psychiatric:        Behavior: Behavior normal.     ED Results / Procedures / Treatments   Labs (all labs ordered are listed, but only abnormal results are displayed) Labs Reviewed  COMPREHENSIVE METABOLIC PANEL - Abnormal; Notable for the following components:      Result Value   Sodium 133 (*)    Chloride 94 (*)    Glucose, Bld 155 (*)    ALT 45 (*)    Total Bilirubin 1.4 (*)    All other components within normal limits  CBC WITH DIFFERENTIAL/PLATELET - Abnormal; Notable for the following components:   RBC 4.20 (*)    Hemoglobin 12.7 (*)    HCT 37.9 (*)    Platelets 417 (*)    Lymphs Abs 0.5 (*)    All other components within normal limits  URINALYSIS, ROUTINE W REFLEX MICROSCOPIC - Abnormal; Notable for the following components:   Color, Urine AMBER (*)    APPearance HAZY (*)    Hgb urine dipstick SMALL (*)    Bilirubin Urine SMALL (*)    Ketones, ur 5 (*)    Protein, ur 30 (*)    Leukocytes,Ua SMALL (*)    WBC, UA >50 (*)    Bacteria, UA FEW (*)    All other components within normal limits  GLUCOSE, CAPILLARY - Abnormal; Notable for the following components:   Glucose-Capillary 115 (*)    All other components within normal limits  URINE CULTURE  SARS CORONAVIRUS 2 (TAT 6-24 HRS)  HIV ANTIBODY (ROUTINE TESTING W REFLEX)    EKG EKG Interpretation  Date/Time:  Saturday January 25 2020 07:54:34 EST Ventricular Rate:  104 PR Interval:    QRS Duration: 87 QT Interval:  366 QTC Calculation: 482 R Axis:   60 Text Interpretation: Sinus tachycardia Ventricular premature complex Aberrant complex Borderline prolonged QT interval no prior available for comparison Confirmed by Quintella Reichert [phone]) on 01/25/2020 8:02:40  AM   Radiology CT Abdomen Pelvis W Contrast  Result Date: 01/25/2020 CLINICAL DATA:  Acute generalized abdominal pain, nausea, vomiting. EXAM: CT ABDOMEN AND PELVIS WITH CONTRAST TECHNIQUE: Multidetector CT imaging of the abdomen and pelvis was performed using the standard protocol following bolus administration of intravenous contrast. CONTRAST:  134mL OMNIPAQUE IOHEXOL 300 MG/ML  SOLN COMPARISON:  None. FINDINGS: Lower chest: No acute abnormality. Hepatobiliary: No focal liver  abnormality is seen. No gallstones, gallbladder wall thickening, or biliary dilatation. Pancreas: Unremarkable. No pancreatic ductal dilatation or surrounding inflammatory changes. Spleen: Normal in size without focal abnormality. Adrenals/Urinary Tract: Adrenal glands are unremarkable. Kidneys are normal, without renal calculi, focal lesion, or hydronephrosis. There appears to be severe wall thickening involving superior portion of the urinary bladder, with fistulization to the adjacent inflamed sigmoid colon. Stomach/Bowel: Small sliding-type hiatal hernia is noted. Otherwise stomach is unremarkable. There appears to be moderate to severe small bowel dilatation is noted concerning for distal small bowel obstruction. Definite transition zone is not identified. Severe wall thickening of proximal sigmoid colon is noted concerning for diverticulitis. There is dilatation of the more proximal colon concerning for obstruction. Large amount of stool is noted in the cecum. The appendix is clearly visualized. Vascular/Lymphatic: No significant vascular findings are present. No enlarged abdominal or pelvic lymph nodes. Reproductive: Prostate is unremarkable. Other: No abdominal wall hernia or abnormality. No abdominopelvic ascites. Musculoskeletal: Severe multilevel degenerative disc disease is noted in the lumbar spine. No acute osseous abnormality is noted. IMPRESSION: Severe wall thickening of proximal sigmoid colon is noted consistent with  sigmoid diverticulitis. There is significant dilatation of the more proximal colon concerning for some degree of obstruction. Large amount of residual stool is noted in the right colon. There is also noted wall thickening involving the superior portion of the urinary bladder with probable fistula formation extending to the inflamed segment of sigmoid colon. Also noted is moderate to severe small bowel dilatation which is concerning for distal small bowel obstruction, although definite transition zone is not identified. Electronically Signed   By: Marijo Conception M.D.   On: 01/25/2020 11:12    Procedures Procedures (including critical care time)  Medications Ordered in ED Medications  enoxaparin (LOVENOX) injection 40 mg (40 mg Subcutaneous Given 01/25/20 1353)  0.45 % sodium chloride infusion ( Intravenous New Bag/Given 01/25/20 1344)  ketorolac (TORADOL) 30 MG/ML injection 30 mg (30 mg Intravenous Given 01/25/20 1401)  insulin aspart (novoLOG) injection 0-15 Units (has no administration in time range)  ondansetron (ZOFRAN) injection 4 mg (has no administration in time range)  piperacillin-tazobactam (ZOSYN) IVPB 3.375 g (3.375 g Intravenous New Bag/Given 01/25/20 1355)  sodium chloride 0.9 % bolus 500 mL (0 mLs Intravenous Stopped 01/25/20 0942)  fentaNYL (SUBLIMAZE) injection 50 mcg (50 mcg Intravenous Given 01/25/20 0825)  ondansetron (ZOFRAN) injection 4 mg (4 mg Intravenous Given 01/25/20 0825)  iohexol (OMNIPAQUE) 300 MG/ML solution 100 mL (100 mLs Intravenous Contrast Given 01/25/20 1035)  piperacillin-tazobactam (ZOSYN) IVPB 3.375 g (0 g Intravenous Stopped 01/25/20 1225)  morphine 4 MG/ML injection 4 mg (4 mg Intravenous Given 01/25/20 1149)    ED Course  I have reviewed the triage vital signs and the nursing notes.  Pertinent labs & imaging results that were available during my care of the patient were reviewed by me and considered in my medical decision making (see chart for details).    MDM  Rules/Calculators/A&P                       Patient here for evaluation of abdominal bloating and abdominal pain with vomiting. He is very distended on examination. CT abdomen pelvis demonstrates diverticulitis with possible bowel obstruction. Will treat with antibiotics. Patient updated findings of studies recommendation for admission and he is in agreement with treatment plan.  D/w [name] with Urology - will see the patient in the ED.  [name] consulted Dr.  Tsuei, who will see the patient in consult.  Hospitalist consulted for admission.   Final Clinical Impression(s) / ED Diagnoses Final diagnoses:  Acute diverticulitis    Rx / DC Orders ED Discharge Orders    None       Quintella Reichert, MD 01/25/20 1410

## 2020-01-25 NOTE — H&P (Addendum)
History and Physical    Juan Richards [id number] DOB: [birth year] DOA: 01/25/2020  PCP: [name], MD (Confirm with patient/family/NH records and if not entered, this has to be entered at [facility] point of entry) Patient coming from: Patient is coming from home  I have personally briefly reviewed patient's old medical records in [place]  Chief Complaint: Acute abdominal pain with distention, constipation  HPI: Juan Richards is a 64 y.o. male with medical history significant of prostate cancer status post robotic assisted prostatectomy 2019, radiation therapy completed in November 2020.  Patient has history of prediabetes.  Last colonoscopy was September 17, 2019 with findings of diverticulosis without diverticulitis, sigmoid stricture was noted.  She had a PET scan October 15, 2019 - for any metastatic disease but positive for sigmoid diverticulitis and small contained perforation.  He has been treated with Flagyl and Cipro for that finding.  He was seen in GI clinic on 01/24/2020 for complaint of abdominal distention, loss of appetite, and constipation.  Routine laboratory was ordered.  He was given a regimen for constipation. Abd films were ordered but report not finalized. On review there is classic "stack of coins" sign c/w SBO. Because of continued pain and distention her presents to [facility] for further evaluation. (For level 3, the HPI must include 4+ descriptors: Location, Quality, Severity, Duration, Timing, Context, modifying factors, associated signs/symptoms and/or status of 3+ chronic problems.)  (Please avoid self-populating past medical history here) (The initial 2-3 lines should be focused and good to copy and paste in the HPI section of the daily progress note).  ED Course: In the ED routine labs were notable for glucose of 155, CBC was normal, Cmet normal. 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 He was started on Zosyn. Dr. Dayna Ramus for Urol and Dr. Prince Solian consulted by EDP  Review of Systems: As per HPI otherwise 10 point review of systems negative.  Unacceptable ROS statements: "10 systems reviewed," "Extensive" (without elaboration).  Acceptable ROS statements: "All others negative," "All others reviewed and are negative," and "All others unremarkable," with at [place] documented Can't double dip - if using for HPI can't use for ROS  Past Medical History:  Diagnosis Date  . DIVERTICULITIS, COLON, WITH PERFORATION 07/23/2009  . Numbness and tingling    RIGHT TOES AND LEFT LEG DUE TO L 5 DISC   . PLEURISY YRS AGO  . Prostate cancer ([place])   . 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 BACK SURGERY  2017   L4 BACK SURGERY  . COLONOSCOPY    . PELVIC LYMPH NODE DISSECTION Bilateral 09/21/2018   Procedure: BILATERAL PELVIC LYMPH NODE DISSECTION;  Surgeon: Ardis Hughs, MD;  Location: [facility];  Service: Urology;  Laterality: Bilateral;  . POLYPECTOMY    . ROBOT ASSISTED LAPAROSCOPIC RADICAL PROSTATECTOMY N/A 09/21/2018   Procedure: XI ROBOTIC ASSISTED LAPAROSCOPIC RADICAL PROSTATECTOMY;  Surgeon: Ardis Hughs, MD;  Location: [facility];  Service: Urology;  Laterality: N/A;   Soc Hx - married. Lives with his wife.   reports that he has never smoked. He has never used smokeless tobacco. He reports current alcohol use of about 2.0 - 3.0 standard drinks of alcohol per week. He reports that he does not use drugs.  No Known Allergies  Family  History  Problem Relation Age of Onset  . Diabetes Mother   . Kidney disease Mother   . Cancer Father        ?  . Diabetes Maternal Grandmother   . Colon cancer Brother   . Esophageal cancer Neg Hx   . Rectal cancer Neg Hx   . Stomach cancer Neg Hx    Unacceptable: Noncontributory,  unremarkable, or negative. Acceptable: Family history reviewed and not pertinent (If you reviewed it)  Prior to Admission medications   Not on File    Physical Exam: Vitals:   01/25/20 0945 01/25/20 1000 01/25/20 1030 01/25/20 1141  BP: (!) 141/96 (!) 143/89  (!) 152/92  Pulse: 86 78 84 85  Resp: 17 17 16 18   Temp:      TempSrc:      SpO2: 98% 99% 99% 99%    Constitutional: NAD, calm, comfortable Vitals:   01/25/20 0945 01/25/20 1000 01/25/20 1030 01/25/20 1141  BP: (!) 141/96 (!) 143/89  (!) 152/92  Pulse: 86 78 84 85  Resp: 17 17 16 18   Temp:      TempSrc:      SpO2: 98% 99% 99% 99%   General:  Heavy set man in no acute distress Eyes: PERRL, lids and conjunctivae normal ENMT: Mucous membranes are moist. Posterior pharynx clear of any exudate or lesions.Normal dentition.  Neck: normal, supple, no masses, no thyromegaly Respiratory: clear to auscultation bilaterally, no wheezing, no crackles. Normal respiratory effort. No accessory muscle use.  Cardiovascular: Regular rate and rhythm, no murmurs / rubs / gallops. No extremity edema. 2+ pedal pulses. No carotid bruits.  Abdomen: Absent BS, distended and tympanetic. Tender LLQ with rebound, tender RLQ Musculoskeletal: no clubbing / cyanosis. No joint deformity upper and lower extremities. Good ROM, no contractures. Normal muscle tone.  Skin: no rashes, lesions, ulcers. No induration Neurologic: CN 2-12 grossly intact. Sensation intact, DTR normal. Strength 5/5 in all 4.  Psychiatric: Normal judgment and insight. Alert and oriented x 3. Normal mood.   (Anything < 9 systems with 2 bullets each down codes to level 1) (If patient refuses exam can't bill higher level) (Make sure to document decubitus ulcers present on admission -- if possible -- and whether patient has chronic indwelling catheter at time of admission)  Labs on Admission: I have personally reviewed following labs and imaging studies  CBC: Recent Labs  Lab  01/24/20 1429 01/25/20 0830  WBC 4.7 6.8  NEUTROABS 2.9 5.9  HGB 11.9* 12.7*  HCT 34.8* 37.9*  MCV 89.5 90.2  PLT 363.0 [id number]*   Basic Metabolic Panel: Recent Labs  Lab 01/24/20 1429 01/25/20 0830  NA 134* 133*  K 3.6 4.0  CL 97 94*  CO2 28 24  GLUCOSE 101* 155*  BUN 17 19  CREATININE 0.75 0.93  CALCIUM 10.1 10.3   GFR: Estimated Creatinine Clearance: 92 mL/min (by C-G formula based on SCr of 0.93 mg/dL). Liver Function Tests: Recent Labs  Lab 01/24/20 1429 01/25/20 0830  AST 15 39  ALT 16 45*  ALKPHOS 56 64  BILITOT 0.7 1.4*  PROT 8.3 7.7  ALBUMIN 4.3 3.9   No results for input(s): LIPASE, AMYLASE in the last 168 hours. No results for input(s): AMMONIA in the last 168 hours. Coagulation Profile: No results for input(s): INR, PROTIME in the last 168 hours. Cardiac Enzymes: No results for input(s): CKTOTAL, CKMB, CKMBINDEX, TROPONINI in the last 168 hours. BNP (last 3 results) No results for input(s): PROBNP in  the last 8760 hours. HbA1C: No results for input(s): HGBA1C in the last 72 hours. CBG: No results for input(s): GLUCAP in the last 168 hours. Lipid Profile: No results for input(s): CHOL, HDL, LDLCALC, TRIG, CHOLHDL, LDLDIRECT in the last 72 hours. Thyroid Function Tests: No results for input(s): TSH, T4TOTAL, FREET4, T3FREE, THYROIDAB in the last 72 hours. Anemia Panel: No results for input(s): VITAMINB12, FOLATE, FERRITIN, TIBC, IRON, RETICCTPCT in the last 72 hours. Urine analysis:    Component Value Date/Time   COLORURINE AMBER (A) 01/25/2020 0939   APPEARANCEUR HAZY (A) 01/25/2020 0939   LABSPEC 1.030 01/25/2020 0939   PHURINE 5.0 01/25/2020 0939   GLUCOSEU NEGATIVE 01/25/2020 0939   GLUCOSEU NEGATIVE 07/22/2009 1428   HGBUR SMALL (A) 01/25/2020 0939   BILIRUBINUR SMALL (A) 01/25/2020 0939   KETONESUR 5 (A) 01/25/2020 0939   PROTEINUR 30 (A) 01/25/2020 0939   UROBILINOGEN 2.0 (H) 07/24/2009 0304   NITRITE NEGATIVE 01/25/2020 0939    LEUKOCYTESUR SMALL (A) 01/25/2020 0939    Radiological Exams on Admission: CT Abdomen Pelvis W Contrast  Result Date: 01/25/2020 CLINICAL DATA:  Acute generalized abdominal pain, nausea, vomiting. EXAM: CT ABDOMEN AND PELVIS WITH CONTRAST TECHNIQUE: Multidetector CT imaging of the abdomen and pelvis was performed using the standard protocol following bolus administration of intravenous contrast. CONTRAST:  143mL OMNIPAQUE IOHEXOL 300 MG/ML  SOLN COMPARISON:  None. FINDINGS: Lower chest: No acute abnormality. Hepatobiliary: No focal liver abnormality is seen. No gallstones, gallbladder wall thickening, or biliary dilatation. Pancreas: Unremarkable. No pancreatic ductal dilatation or surrounding inflammatory changes. Spleen: Normal in size without focal abnormality. Adrenals/Urinary Tract: Adrenal glands are unremarkable. Kidneys are normal, without renal calculi, focal lesion, or hydronephrosis. There appears to be severe wall thickening involving superior portion of the urinary bladder, with fistulization to the adjacent inflamed sigmoid colon. Stomach/Bowel: Small sliding-type hiatal hernia is noted. Otherwise stomach is unremarkable. There appears to be moderate to severe small bowel dilatation is noted concerning for distal small bowel obstruction. Definite transition zone is not identified. Severe wall thickening of proximal sigmoid colon is noted concerning for diverticulitis. There is dilatation of the more proximal colon concerning for obstruction. Large amount of stool is noted in the cecum. The appendix is clearly visualized. Vascular/Lymphatic: No significant vascular findings are present. No enlarged abdominal or pelvic lymph nodes. Reproductive: Prostate is unremarkable. Other: No abdominal wall hernia or abnormality. No abdominopelvic ascites. Musculoskeletal: Severe multilevel degenerative disc disease is noted in the lumbar spine. No acute osseous abnormality is noted. IMPRESSION: Severe wall  thickening of proximal sigmoid colon is noted consistent with sigmoid diverticulitis. There is significant dilatation of the more proximal colon concerning for some degree of obstruction. Large amount of residual stool is noted in the right colon. There is also noted wall thickening involving the superior portion of the urinary bladder with probable fistula formation extending to the inflamed segment of sigmoid colon. Also noted is moderate to severe small bowel dilatation which is concerning for distal small bowel obstruction, although definite transition zone is not identified. Electronically Signed   By: Marijo Conception M.D.   On: 01/25/2020 11:12    EKG: Independently reviewed. Sinus tachycardia, no acute changes, question QT prolongation  Assessment/Plan Active Problems:   SBO (small bowel obstruction) (HCC)   DIVERTICULITIS, COLON, WITH PERFORATION   Pre-diabetes   Prostate cancer ([place])   Diverticulitis of large intestine with perforation  (please populate well all problems here in Problem List. (For example, if patient is on  BP meds at home and you resume or decide to hold them, it is a problem that needs to be her. Same for CAD, COPD, HLD and so on)   1. Diverticulitis - patient with recent h/o diverticulitis treated in Nov with Cipro/Flagyl. 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 No fever, no leukocytosis mitigating evidence of diverticulitis Plan Med-surg admit  Zosyn q 6 hrs for abx coverage diverticulitis  Pain mgt with Ketorolac q 6 IV  Urology consult   2. SBO -patient with clinical signs of obstruction: N/V, has not been able to eat for over a week and has not been able to keep down fluids. On Exam distended abd, absent BS. CT suggestive of distal small bowel obstruction Plan NPO except sip/chips  IVF  Antiemetics  GS consult  F/u xray  For refractory nausea (currently not a problem) NG to low  suction.  3. Prediabetes - last A1C 5.8% Nov '20. Currently with elevated glucose at 155 Plan Ss coverage.  DVT prophylaxis: lovenox (Lovenox/Heparin/SCD's/anticoagulated/None (if comfort care) Code Status: full code (Full/Partial (specify details) Family Communication: spoke withwife [name] - reviewed dx and treatment plan. (Specify name, relationship. Do not write "discussed with patient". Specify tel # if discussed over the phone) Disposition Plan: home when stable (specify when and where you expect patient to be discharged) Consults called: Dr. Herrick-Urology, Dr. Prince Solian -GS (with names) Admission status: inpatient (inpatient / obs / tele / medical floor / SDU)   Adella Hare MD [facility] Pager [phone]  If 7PM-7AM, please contact night-coverage [email or web address] Password [email or web address]  01/25/2020, 1:01 PM

## 2020-01-25 NOTE — Telephone Encounter (Addendum)
Received a call [place] recent radiology about a KUB that was performed showing large stool burden with dilated air-filled loops of large bowel which is concerning for potential distal large bowel obstruction.   The KUB was performed on 2/5 however, Radiology did not read until 2/6 PM. Looks like patient came into the ED and subsequently has been admitted to the surgical service at [facility]. GI was not called. Will place on list for [place] to see how patient does.  Justice Britain, MD [facility] # [phone]

## 2020-01-25 NOTE — Consult Note (Signed)
Gastroenterology Inpatient Consultation   Attending Requesting Consult [facility], [facility] Kn[facility], [facility] Day: 1  Reason for Consult Admission after recent outpatient evaluation with findings of complicated diverticulitis   History of Present Illness  Juan Richards is a 64 y.o. male with a pmh significant for diverticulosis and prior diverticulitis, prostate cancer s/p prostatectomy with subsequent radiation for metastatic disease.  The GI service is consulted for evaluation and management of complicated diverticulitis.    The patient states that since initiating his radiation that he has had issues of worsening abdominal discomfort and gurgling and pain.  He was diagnosed with a diverticular stenosis based on colonoscopy at the end of last year by Dr. Fuller Plan.  He found at that time diverticulitis was treated with antibiotics and the patient states he was doing much better.  However, the patient has just not felt well since the initiation of his radiation.  He has been concerned about this.  Did not see GI until yesterday.  Patient's had increased issues prior to his admission with nausea and vomiting.  He was sent for laboratories as well as a KUB and was told to wait for a call with next action plan.  The patient could not tolerate the worsening discomfort and presented to the emergency department early this morning and was subsequently admitted after CT abdomen pelvis showed evidence of significant findings of complicated diverticulitis and possible colovesical fistula.  I received a call from [facility] radiology after 2 PM that the KUB had finally been read and subsequently showed findings concerning for potential obstruction but he had already been admitted to the hospital.  Today when I talk with the patient, he is frustrated and a bit upset as result of never hearing about the results of the KUB.  He does feel better while on antibiotics at this time than when he came into the  hospital earlier this morning.  No fevers or chills.  He denies pneumaturia or fecaluria.  He had a bowel movement earlier today.  He has passed some gas but not a significant amount.  He has not had an NG tube placed.  He has been seen by surgery monitoring the patient closely.  GI Review of Systems Positive as above Negative for dysphagia, odynophagia   Review of Systems  General: Positive for weight loss but denies fevers or chills HEENT: Denies oral lesions Cardiovascular: Denies chest pain Pulmonary: Denies shortness of breath Gastroenterological: See HPI Hematological: Denies easy bruising/bleeding Dermatological: Denies jaundice Psychological: Mood is frustrated and upset   Histories  Past Medical History Past Medical History:  Diagnosis Date  . DIVERTICULITIS, COLON, WITH PERFORATION 07/23/2009  . Numbness and tingling    RIGHT TOES AND LEFT LEG DUE TO L 5 DISC   . PLEURISY YRS AGO  . Prostate cancer ([place])   . 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 BACK SURGERY  2017   L4 BACK SURGERY  . COLONOSCOPY    . PELVIC LYMPH NODE DISSECTION Bilateral 09/21/2018   Procedure: BILATERAL PELVIC LYMPH NODE DISSECTION;  Surgeon: Ardis Hughs, MD;  Location: [facility];  Service: Urology;  Laterality: Bilateral;  . POLYPECTOMY    . ROBOT ASSISTED LAPAROSCOPIC RADICAL PROSTATECTOMY N/A 09/21/2018   Procedure: XI ROBOTIC ASSISTED LAPAROSCOPIC RADICAL PROSTATECTOMY;  Surgeon: Ardis Hughs, MD;  Location: [facility];  Service: Urology;  Laterality: N/A;    Allergies No Known Allergies  Family History Family  History  Problem Relation Age of Onset  . Diabetes Mother   . Kidney disease Mother   . Cancer Father        ?  . Diabetes Maternal Grandmother   . Colon cancer Brother   . Esophageal cancer Neg Hx   . Rectal cancer Neg Hx   . Stomach cancer Neg Hx     Social History Social History   Socioeconomic History  .  Marital status: Married    Spouse name: Not on file  . Number of children: 1  . Years of education: 73  . Highest education level: Not on file  Occupational History  . Occupation: Production assistant, radio  Tobacco Use  . Smoking status: Never Smoker  . Smokeless tobacco: Never Used  Substance and Sexual Activity  . Alcohol use: Yes    Alcohol/week: 2.0 - 3.0 standard drinks    Types: 2 - 3 Cans of beer per week    Comment: OCC  . Drug use: No  . Sexual activity: Yes  Other Topics Concern  . Not on file  Social History Narrative   Fun: Watch TV   Social Determinants of Health   Financial Resource Strain:   . Difficulty of Paying Living Expenses: Not on file  Food Insecurity:   . Worried About Charity fundraiser in the Last Year: Not on file  . Ran Out of Food in the Last Year: Not on file  Transportation Needs:   . Lack of Transportation (Medical): Not on file  . Lack of Transportation (Non-Medical): Not on file  Physical Activity:   . Days of Exercise per Week: Not on file  . Minutes of Exercise per Session: Not on file  Stress:   . Feeling of Stress : Not on file  Social Connections:   . Frequency of Communication with Friends and Family: Not on file  . Frequency of Social Gatherings with Friends and Family: Not on file  . Attends Religious Services: Not on file  . Active Member of Clubs or Organizations: Not on file  . Attends Archivist Meetings: Not on file  . Marital Status: Not on file  Intimate Partner Violence:   . Fear of Current or Ex-Partner: Not on file  . Emotionally Abused: Not on file  . Physically Abused: Not on file  . Sexually Abused: Not on file     Medications  Home Medications No current facility-administered medications on file prior to encounter.   No current outpatient medications on file prior to encounter.   Scheduled Inpatient Medications . enoxaparin (LOVENOX) injection  40 mg Subcutaneous Q24H  . insulin aspart  0-15  Units Subcutaneous TID WC   Continuous Inpatient Infusions . sodium chloride 50 mL/hr at 01/25/20 1344  . piperacillin-tazobactam (ZOSYN)  IV 3.375 g (01/25/20 2011)   PRN Inpatient Medications ketorolac, ondansetron (ZOFRAN) IV   Physical Examination  BP 125/86 (BP Location: Right Arm)   Pulse 90   Temp 98.5 F (36.9 C) (Oral)   Resp 19   Ht 5\' 9"  (1.753 m)   Wt 87.2 kg   SpO2 99%   BMI 28.39 kg/m  GEN: NAD, resting in bed  PSYCH: Cooperative EYE: Conjunctivae pink, sclerae anicteric ENT: MMM, without oral ulcers, no erythema or exudates noted CV: Without rubs or gallops RESP: No wheezing present GI: Hypoactive bowel sounds, protuberant abdomen, healed surgical scars present, hernia present in the lower abdomen, tenderness to palpation throughout the lower abdomen, no rebound but  volitional guarding is present  MSK/EXT: Lower extremity edema present SKIN: No jaundice NEURO:  Alert & Oriented x 3, no focal deficits   Review of Data  I reviewed the following data at the time of this encounter:  Laboratory Studies   Recent Labs  Lab 01/25/20 0830  NA 133*  K 4.0  CL 94*  CO2 24  BUN 19  CREATININE 0.93  GLUCOSE 155*  CALCIUM 10.3   Recent Labs  Lab 01/25/20 0830  AST 39  ALT 45*  ALKPHOS 64    Recent Labs  Lab 01/24/20 1429 01/24/20 1429 01/25/20 0830  WBC 4.7   < > 6.8  HGB 11.9*   < > 12.7*  HCT 34.8*   < > 37.9*  PLT 363.0  --  417*   < > = values in this interval not displayed.   No results for input(s): APTT, INR in the last 168 hours. Computed MELD-Na score unavailable. Necessary lab results were not found in the last year. Computed MELD score unavailable. Necessary lab results were not found in the last year.  Imaging Studies  01/24/2019 CT abdomen pelvis IMPRESSION: Severe wall thickening of proximal sigmoid colon is noted consistent with sigmoid diverticulitis. There is significant dilatation of the more proximal colon concerning for  some degree of obstruction. Large amount of residual stool is noted in the right colon. There is also noted wall thickening involving the superior portion of the urinary bladder with probable fistula formation extending to the inflamed segment of sigmoid colon.  Also noted is moderate to severe small bowel dilatation which is concerning for distal small bowel obstruction, although definite transition zone is not identified  GI Procedures and Studies  September 2020 colonoscopy - Three 5 to 8 mm polyps in the rectum and in the sigmoid colon, removed with a cold snare. Resected and retrieved. - Severe diverticulosis with diverticulitis in the left colon. - Stricture in the sigmoid colon. - Internal hemorrhoids. - The examination was otherwise normal on direct and retroflexion views.   Assessment  Mr. Goette is a 64 y.o. male with a pmh significant for diverticulosis and prior diverticulitis, prostate cancer s/p prostatectomy with subsequent radiation for metastatic disease.  The GI service is consulted for evaluation and management of complicated diverticulitis.    The patient is hemodynamically stable however from a clinical perspective he has evidence of significant complicated diverticulitis.  With his recent radiation it seems unlikely that this would be radiation-induced issues but we have to keep that in mind as well.  We greatly appreciate from the GI standpoint of our surgical colleagues monitoring the patient.  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  I have a concern and have told the patient that I agree with Dr. Georgette Dover that there is a chance he may need an diversion but hopefully we do not have to come to that.  I also apologized that our service had not reached out to him with the results of the KUB but I told him, that we ourselves in heard  about the results until 2 PM today and thus that would be a reason why we did not reach out to the patient in follow-up since we do not have the final results.  However, we will be with him moving forward happy to be of any assistance but it seems from a  medical standpoint IV antibiotics, supportive measures, and if this fails the surgery is the likely course.  Will pass by and see how he is doing tomorrow.  Plan/Recommendations  Appreciate surgical input Continue antibiotics Continue supportive measures and IV fluids If patient has progressive worsening he will need NG tube and he understands this No endoscopic treatment available at this time   Thank you for this consult.  We will continue to follow.  Please page/call with questions or concerns.   Justice Britain, MD [facility] # [phone]

## 2020-01-25 NOTE — Consult Note (Signed)
Reason for Consult:  Severe diverticulitis Referring Physician: [name] is an 64 y.o. male.  HPI: This is a 64 year old male with a history of complicated diverticulitis in 2010, also s/p prostatectomy followed by XRT recently for metastatic disease.  He has had chronic issues with constipation since surgery.  He presents with a 2-week history of abdominal distention and bloating, progressing to nausea and vomiting.  It has been several days since his last BM.  Afebrile.  Normal WBC.    CT scan shows dilated distal SB, thickening of sigmoid colon with probable colovesical fistula.  He is being admitted by [facility] for bowel rest, IV hydration, and IV antibiotics.  Past Medical History:  Diagnosis Date  . DIVERTICULITIS, COLON, WITH PERFORATION 07/23/2009  . Numbness and tingling    RIGHT TOES AND LEFT LEG DUE TO L 5 DISC   . PLEURISY YRS AGO  . Prostate cancer ([place])   . 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 BACK SURGERY  2017   L4 BACK SURGERY  . COLONOSCOPY    . PELVIC LYMPH NODE DISSECTION Bilateral 09/21/2018   Procedure: BILATERAL PELVIC LYMPH NODE DISSECTION;  Surgeon: Juan Hughs, MD;  [facility];  Service: Urology;  Laterality: Bilateral;  . POLYPECTOMY    . ROBOT ASSISTED LAPAROSCOPIC RADICAL PROSTATECTOMY N/A 09/21/2018   Procedure: XI ROBOTIC ASSISTED LAPAROSCOPIC RADICAL PROSTATECTOMY;  Surgeon: Juan Hughs, MD;  Location[facility];  Service: Urology;  Laterality: N/A;    Family History  Problem Relation Age of Onset  . Diabetes Mother   . Kidney disease Mother   . Cancer Father        ?  . Diabetes Maternal Grandmother   . Colon cancer Brother   . Esophageal cancer Neg Hx   . Rectal cancer Neg Hx   . Stomach cancer Neg Hx     Social History:  reports that he has never smoked. He has never used smokeless tobacco. He reports current alcohol use of about 2.0 - 3.0 standard drinks of  alcohol per week. He reports that he does not use drugs.  Allergies: No Known Allergies  Medications:  Prior to Admission medications   Not on File     Results for orders placed or performed during the hospital encounter of 01/25/20 (from the past 48 hour(s))  Comprehensive metabolic panel     Status: Abnormal   Collection Time: 01/25/20  8:30 AM  Result Value Ref Range   Sodium 133 (L) 135 - 145 mmol/L   Potassium 4.0 3.5 - 5.1 mmol/L   Chloride 94 (L) 98 - 111 mmol/L   CO2 24 22 - 32 mmol/L   Glucose, Bld 155 (H) 70 - 99 mg/dL   BUN 19 8 - 23 mg/dL   Creatinine, Ser 0.93 0.61 - 1.24 mg/dL   Calcium 10.3 8.9 - 10.3 mg/dL   Total Protein 7.7 6.5 - 8.1 g/dL   Albumin 3.9 3.5 - 5.0 g/dL   AST 39 15 - 41 U/L   ALT 45 (H) 0 - 44 U/L   Alkaline Phosphatase 64 38 - 126 U/L   Total Bilirubin 1.4 (H) 0.3 - 1.2 mg/dL   GFR calc non Af Amer >60 >60 mL/min   GFR calc Af Amer >60 >60 mL/min   Anion gap 15 5 - 15    Comment: Performed at [facility], [address]  [place], [place] [address]  CBC with Differential     Status: Abnormal   Collection Time: 01/25/20  8:30 AM  Result Value Ref Range   WBC 6.8 4.0 - 10.5 K/uL   RBC 4.20 (L) 4.22 - 5.81 MIL/uL   Hemoglobin 12.7 (L) 13.0 - 17.0 g/dL   HCT 37.9 (L) 39.0 - 52.0 %   MCV 90.2 80.0 - 100.0 fL   MCH 30.2 26.0 - 34.0 pg   MCHC 33.5 30.0 - 36.0 g/dL   RDW 13.6 11.5 - 15.5 %   Platelets 417 (H) 150 - 400 K/uL   nRBC 0.0 0.0 - 0.2 %   Neutrophils Relative % 86 %   Neutro Abs 5.9 1.7 - 7.7 K/uL   Lymphocytes Relative 8 %   Lymphs Abs 0.5 (L) 0.7 - 4.0 K/uL   Monocytes Relative 6 %   Monocytes Absolute 0.4 0.1 - 1.0 K/uL   Eosinophils Relative 0 %   Eosinophils Absolute 0.0 0.0 - 0.5 K/uL   Basophils Relative 0 %   Basophils Absolute 0.0 0.0 - 0.1 K/uL   Immature Granulocytes 0 %   Abs Immature Granulocytes 0.02 0.00 - 0.07 K/uL    Comment: Performed at [facility], [place] [address], [place], [place] [address]   Urinalysis, Routine w reflex microscopic     Status: Abnormal   Collection Time: 01/25/20  9:39 AM  Result Value Ref Range   Color, Urine AMBER (A) YELLOW    Comment: BIOCHEMICALS MAY BE AFFECTED BY COLOR   APPearance HAZY (A) CLEAR   Specific Gravity, Urine 1.030 1.005 - 1.030   pH 5.0 5.0 - 8.0   Glucose, UA NEGATIVE NEGATIVE mg/dL   Hgb urine dipstick SMALL (A) NEGATIVE   Bilirubin Urine SMALL (A) NEGATIVE   Ketones, ur 5 (A) NEGATIVE mg/dL   Protein, ur 30 (A) NEGATIVE mg/dL   Nitrite NEGATIVE NEGATIVE   Leukocytes,Ua SMALL (A) NEGATIVE   RBC / HPF 0-5 0 - 5 RBC/hpf   WBC, UA >50 (H) 0 - 5 WBC/hpf   Bacteria, UA FEW (A) NONE SEEN   Squamous Epithelial / LPF 0-5 0 - 5   WBC Clumps PRESENT    Mucus PRESENT    Hyaline Casts, UA PRESENT    Ca Oxalate Crys, UA PRESENT     Comment: Performed at [facility], [place] [address], [place], [place] [address]  Glucose, capillary     Status: Abnormal   Collection Time: 01/25/20  1:34 PM  Result Value Ref Range   Glucose-Capillary 115 (H) 70 - 99 mg/dL    CT Abdomen Pelvis W Contrast  Result Date: 01/25/2020 CLINICAL DATA:  Acute generalized abdominal pain, nausea, vomiting. EXAM: CT ABDOMEN AND PELVIS WITH CONTRAST TECHNIQUE: Multidetector CT imaging of the abdomen and pelvis was performed using the standard protocol following bolus administration of intravenous contrast. CONTRAST:  145mL OMNIPAQUE IOHEXOL 300 MG/ML  SOLN COMPARISON:  None. FINDINGS: Lower chest: No acute abnormality. Hepatobiliary: No focal liver abnormality is seen. No gallstones, gallbladder wall thickening, or biliary dilatation. Pancreas: Unremarkable. No pancreatic ductal dilatation or surrounding inflammatory changes. Spleen: Normal in size without focal abnormality. Adrenals/Urinary Tract: Adrenal glands are unremarkable. Kidneys are normal, without renal calculi, focal lesion, or hydronephrosis. There appears to be severe wall thickening involving superior portion  of the urinary bladder, with fistulization to the adjacent inflamed sigmoid colon. Stomach/Bowel: Small sliding-type hiatal hernia is noted. Otherwise stomach is unremarkable. There appears to be moderate  to severe small bowel dilatation is noted concerning for distal small bowel obstruction. Definite transition zone is not identified. Severe wall thickening of proximal sigmoid colon is noted concerning for diverticulitis. There is dilatation of the more proximal colon concerning for obstruction. Large amount of stool is noted in the cecum. The appendix is clearly visualized. Vascular/Lymphatic: No significant vascular findings are present. No enlarged abdominal or pelvic lymph nodes. Reproductive: Prostate is unremarkable. Other: No abdominal wall hernia or abnormality. No abdominopelvic ascites. Musculoskeletal: Severe multilevel degenerative disc disease is noted in the lumbar spine. No acute osseous abnormality is noted. IMPRESSION: Severe wall thickening of proximal sigmoid colon is noted consistent with sigmoid diverticulitis. There is significant dilatation of the more proximal colon concerning for some degree of obstruction. Large amount of residual stool is noted in the right colon. There is also noted wall thickening involving the superior portion of the urinary bladder with probable fistula formation extending to the inflamed segment of sigmoid colon. Also noted is moderate to severe small bowel dilatation which is concerning for distal small bowel obstruction, although definite transition zone is not identified. Electronically Signed   By: Marijo Conception M.D.   On: 01/25/2020 11:12    Review of Systems  Constitutional: Negative for fever, chills and unexpected weight change.  HENT: Negative for hearing loss, congestion, sore throat, trouble swallowing and voice change.  Eyes: Negative for visual disturbance.  Respiratory: Negative for cough and wheezing.  Cardiovascular: Negative for chest pain,  palpitations and leg swelling.  Gastrointestinal: Positive for nausea, vomiting, abdominal pain, distention, constipation. Negative for diarrhea, blood in stool, and anal bleeding.  Genitourinary: Negative for hematuria, vaginal bleeding and difficulty urinating.  Musculoskeletal: Negative for arthralgias.  Skin: Negative for rash and wound.  Neurological: Negative for seizures, syncope and headaches.  Hematological: Negative for adenopathy. Does not bruise/bleed easily.  Psychiatric/Behavioral: Negative for confusion.  10-system review otherwise negative.  Blood pressure (!) 140/98, pulse 93, temperature 97.9 F (36.6 C), temperature source Axillary, resp. rate 18, height 5\' 9"  (1.753 m), weight 87.2 kg, SpO2 99 %. Physical Exam Patient appears to be in no acute distress  patient is alert and oriented x3 Atraumatic normocephalic head No cervical or supraclavicular lymphadenopathy appreciated No increased work of breathing, no audible wheezes/rhonchi Regular sinus rhythm/rate Abdomen is severely distended and tender. Lower extremities are symmetric without appreciable edema Grossly neurologically intact No identifiable skin lesions  Assessment/Plan: 1.  Dilated SB with apparent distal obstruction, possibly secondary to severe constipation - stool fills the colon all the way to the cecum. 2.  Thickening of the proximal sigmoid colon with probable colovesical fistula - this could represent diverticulitis, although I do not see much evidence of diverticulosis.  This could also be secondary to pelvic radiation causing dysmotility, thickening, stricture of the sigmoid colon with accompany colovesical fistula.  This could be causing the severe constipation and distention, resulting in obstructive symptoms.  Recs:   NPO x ice chips If he vomits again, would place NG tube to ILWS Agree with IV antibiotics No indications for urgent surgery, but he does not improve with IV antibiotics and  bowel rest, he might require at the very least a diverting colostomy/ mucus fistula.  Attempting a sigmoid resection and repair of the colovesical fistula in a recently radiated field would be risky at this time. Repeat abdominal films in AM.  We will continue to follow.  Juan Richards 01/25/2020, 2:36 PM

## 2020-01-25 NOTE — Consult Note (Signed)
I have been asked to see the patient by Dr. Darol Destine, for evaluation and management of diverticulitis.  History of present illness: 64 year old male well-known to me for his history of prostate cancer.  The patient was diagnosed with prostate cancer in 2017.  He underwent a robotic assisted prostatectomy in the fall 2019.  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  Over the course of his recovery he has struggled to regain control of his urine, but equally struggled with his bowels and his GI dysfunction.  He had been working aggressively with physical therapy and his bowels had improved quite significantly.  His urine control was also improving.  Unfortunately, given the patient's metastatic disease his PSA did start to rise again, and as such we referred him to to radiation oncology for salvage radiation.  He is recently completed this.  I saw the patient after he had begun the pelvic radiation and at that time, approximately 2 months ago, he was complaining of some mucoid discharge from his urethra intermittently.  He was not having any significant dysuria.  He was not having any worsening of his lower urinary tract symptoms.  At this time, I did not think any thing of the discharge, thinking this was maybe mucus discharge from his urethra.  The patient relates a 2-week history of worsening distention and bloating.  Finally over the course of the last 48 hours he has had emesis and food intolerance.  He is having severe constipation and bloating.  Denies any fevers.  He denies any changes to his urination.  Review of systems: A 12 point comprehensive review of systems was obtained and is negative unless otherwise stated in the history of present illness.  Patient Active Problem List   Diagnosis Date Noted  . Diverticulitis of large intestine with perforation 01/25/2020  . SBO (small bowel obstruction) ([place]) 01/25/2020  . Prostate cancer ([place])  09/21/2018  . Pre-diabetes 06/20/2018  . Routine general medical examination at a health care facility 08/11/2016  . DIVERTICULITIS, COLON, WITH PERFORATION 07/23/2009  . Gross hematuria 07/23/2009  . 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 EPIDIDYMAL CYST 08/31/2007    No current facility-administered medications on file prior to encounter.   No current outpatient medications on file prior to encounter.    Past Medical History:  Diagnosis Date  . DIVERTICULITIS, COLON, WITH PERFORATION 07/23/2009  . Numbness and tingling    RIGHT TOES AND LEFT LEG DUE TO L 5 DISC   . PLEURISY YRS AGO  . Prostate cancer ([place])   . 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 BACK SURGERY  2017   L4 BACK SURGERY  . COLONOSCOPY    . PELVIC LYMPH NODE DISSECTION Bilateral 09/21/2018   Procedure: BILATERAL PELVIC LYMPH NODE DISSECTION;  Surgeon: Ardis Hughs, MD;  Location: [facility];  Service: Urology;  Laterality: Bilateral;  . POLYPECTOMY    . ROBOT ASSISTED LAPAROSCOPIC RADICAL PROSTATECTOMY N/A 09/21/2018   Procedure: XI ROBOTIC ASSISTED LAPAROSCOPIC RADICAL PROSTATECTOMY;  Surgeon: Ardis Hughs, MD;  Location: [facility];  Service: Urology;  Laterality: N/A;    Social History   Tobacco Use  . Smoking status: Never Smoker  . Smokeless tobacco: Never Used  Substance Use Topics  . Alcohol use: Yes    Alcohol/week: 2.0 - 3.0 standard drinks    Types: 2 - 3 Cans of beer per week  Comment: OCC  . Drug use: No    Family History  Problem Relation Age of Onset  . Diabetes Mother   . Kidney disease Mother   . Cancer Father        ?  . Diabetes Maternal Grandmother   . Colon cancer Brother   . Esophageal cancer Neg Hx   . Rectal cancer Neg Hx   . Stomach cancer Neg Hx     PE: Vitals:   01/25/20 0945 01/25/20 1000 01/25/20 1030 01/25/20 1141  BP: (!) 141/96 (!) 143/89  (!) 152/92  Pulse: 86 78 84 85  Resp: 17 17 16 18   Temp:       TempSrc:      SpO2: 98% 99% 99% 99%   Patient appears to be in no acute distress  patient is alert and oriented x3 Atraumatic normocephalic head No cervical or supraclavicular lymphadenopathy appreciated No increased work of breathing, no audible wheezes/rhonchi Regular sinus rhythm/rate Abdomen is severely distended and tender. Lower extremities are symmetric without appreciable edema Grossly neurologically intact No identifiable skin lesions  Recent Labs    01/24/20 1429 01/25/20 0830  WBC 4.7 6.8  HGB 11.9* 12.7*  HCT 34.8* 37.9*   Recent Labs    01/24/20 1429 01/25/20 0830  NA 134* 133*  K 3.6 4.0  CL 97 94*  CO2 28 24  GLUCOSE 101* 155*  BUN 17 19  CREATININE 0.75 0.93  CALCIUM 10.1 10.3   No results for input(s): LABPT, INR in the last 72 hours. No results for input(s): LABURIN in the last 72 hours. Results for orders placed or performed during the hospital encounter of 11/08/19  Urine culture     Status: Abnormal   Collection Time: 11/08/19  3:04 PM   Specimen: Urine, Clean Catch  Result Value Ref Range Status   Specimen Description   Final    URINE, CLEAN CATCH Performed at [facility], [place] [address] [place], [place] [address]    Special Requests   Final    NONE Performed at [facility], [place] [address] [place], [place] [address]    Culture (A)  Final    <10,000 COLONIES/mL INSIGNIFICANT GROWTH Performed at [facility] [address], [place], [place] [address]    Report Status 11/09/2019 FINAL  Final    Imaging: I reviewed the patient's abdominal pelvic CT scan which shows significantly dilated loops of bowel.  There is air in the bladder as well concerning for a colovesical fistula.  Imp: Patient with a history of metastatic prostate cancer status post salvage radiation.  He also has a history of diverticulitis and his current presentation is concerning for bowel obstruction, acute  exacerbation of his diverticulitis, and colovesical fistula.  Recommendations: Spoke with general surgery who recommended that the patient be admitted for IV antibiotics and bowel rest.  Hopefully, things will cool off on their own, and the patient will not require any urgent surgical intervention.  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  I will continue to follow the patient peripherally.  At this point, he needs no urologic interventions.   Thank you for involving me in this patient's care, Please page with any further questions or concerns. Ardis Hughs

## 2020-01-25 NOTE — ED Notes (Signed)
Got patient on the monitor patient is resting with call bell in reach  ?

## 2020-01-25 NOTE — ED Notes (Signed)
Pt transported to CT ?

## 2020-01-25 NOTE — ED Triage Notes (Signed)
Pt c/o swollen abdomen, with mild tenderness with nausea and vomiting x6 days. States a two weeks ago he completed radiation treatments for his prostate. States hes had diarrhea off and on and also constipation. Had abdomen xrays yesterday at urology office but has not heard the results.

## 2020-01-26 ENCOUNTER — Inpatient Hospital Stay (HOSPITAL_COMMUNITY): Payer: 59 | Admitting: Registered Nurse

## 2020-01-26 ENCOUNTER — Other Ambulatory Visit: Payer: Self-pay

## 2020-01-26 ENCOUNTER — Inpatient Hospital Stay (HOSPITAL_COMMUNITY): Payer: 59

## 2020-01-26 ENCOUNTER — Encounter (HOSPITAL_COMMUNITY)
Admission: EM | Disposition: A | Discharge status: Home Health | Payer: Self-pay | Source: Home / Self Care | Attending: Family Medicine

## 2020-01-26 DIAGNOSIS — N3000 Acute cystitis without hematuria: Secondary | ICD-10-CM

## 2020-01-26 DIAGNOSIS — A419 Sepsis, unspecified organism: Principal | ICD-10-CM

## 2020-01-26 DIAGNOSIS — C61 Malignant neoplasm of prostate: Secondary | ICD-10-CM

## 2020-01-26 DIAGNOSIS — N321 Vesicointestinal fistula: Secondary | ICD-10-CM | POA: Diagnosis present

## 2020-01-26 DIAGNOSIS — D709 Neutropenia, unspecified: Secondary | ICD-10-CM

## 2020-01-26 DIAGNOSIS — R5081 Fever presenting with conditions classified elsewhere: Secondary | ICD-10-CM

## 2020-01-26 DIAGNOSIS — R000 Tachycardia, unspecified: Secondary | ICD-10-CM | POA: Diagnosis present

## 2020-01-26 DIAGNOSIS — R6520 Severe sepsis without septic shock: Secondary | ICD-10-CM

## 2020-01-26 HISTORY — PX: COLON SURGERY: SHX602

## 2020-01-26 HISTORY — PX: LAPAROTOMY: SHX154

## 2020-01-26 LAB — BASIC METABOLIC PANEL
Anion gap: 12 (ref 5–15)
BUN: 25 mg/dL — ABNORMAL HIGH (ref 8–23)
CO2: 27 mmol/L (ref 22–32)
Calcium: 9.3 mg/dL (ref 8.9–10.3)
Chloride: 97 mmol/L — ABNORMAL LOW (ref 98–111)
Creatinine, Ser: 1.14 mg/dL (ref 0.61–1.24)
GFR calc Af Amer: >60 mL/min (ref >60)
GFR calc non Af Amer: >60 mL/min (ref >60)
Glucose, Bld: 135 mg/dL — ABNORMAL HIGH (ref 70–99)
Potassium: 3.9 mmol/L (ref 3.5–5.1)
Sodium: 136 mmol/L (ref 135–145)

## 2020-01-26 LAB — GLUCOSE, CAPILLARY
Glucose-Capillary: 109 mg/dL — ABNORMAL HIGH (ref 70–99)
Glucose-Capillary: 105 mg/dL — ABNORMAL HIGH (ref 70–99)
Glucose-Capillary: 133 mg/dL — ABNORMAL HIGH (ref 70–99)
Glucose-Capillary: 117 mg/dL — ABNORMAL HIGH (ref 70–99)

## 2020-01-26 LAB — CBC
HCT: 34.3 % — ABNORMAL LOW (ref 39.0–52.0)
Hemoglobin: 11.3 g/dL — ABNORMAL LOW (ref 13.0–17.0)
MCH: 29.8 pg (ref 26.0–34.0)
MCHC: 32.9 g/dL (ref 30.0–36.0)
MCV: 90.5 fL (ref 80.0–100.0)
Platelets: 373 10*3/uL (ref 150–400)
RBC: 3.79 MIL/uL — ABNORMAL LOW (ref 4.22–5.81)
RDW: 13.7 % (ref 11.5–15.5)
WBC: 1.3 10*3/uL — CL (ref 4.0–10.5)
nRBC: 0 % (ref 0.0–0.2)

## 2020-01-26 LAB — TYPE AND SCREEN
ABO/RH(D): O POS
Antibody Screen: NEGATIVE

## 2020-01-26 LAB — MRSA PCR SCREENING: MRSA by PCR: NEGATIVE

## 2020-01-26 LAB — HIV ANTIBODY (ROUTINE TESTING W REFLEX): HIV Screen 4th Generation wRfx: NONREACTIVE

## 2020-01-26 LAB — ABO/RH: ABO/RH(D): O POS

## 2020-01-26 SURGERY — LAPAROTOMY, EXPLORATORY
Anesthesia: General | Site: Abdomen

## 2020-01-26 MED ORDER — FENTANYL CITRATE (PF) 100 MCG/2ML IJ SOLN
INTRAMUSCULAR | Status: AC
  Administered 2020-01-26: 50 ug
  Filled 2020-01-26: qty 2

## 2020-01-26 MED ORDER — LACTATED RINGERS IV SOLN: INTRAVENOUS | Status: DC | PRN

## 2020-01-26 MED ORDER — FENTANYL CITRATE (PF) 250 MCG/5ML IJ SOLN
INTRAMUSCULAR | Status: DC | PRN
  Administered 2020-01-26: 50 ug via INTRAVENOUS
  Administered 2020-01-26: 100 ug via INTRAVENOUS
  Administered 2020-01-26 (×2): 50 ug via INTRAVENOUS

## 2020-01-26 MED ORDER — LACTATED RINGERS IV SOLN: INTRAVENOUS | Status: DC

## 2020-01-26 MED ORDER — ONDANSETRON HCL 4 MG/2ML IJ SOLN
INTRAMUSCULAR | Status: AC
  Filled 2020-01-26: qty 2

## 2020-01-26 MED ORDER — PIPERACILLIN-TAZOBACTAM 3.375 G IVPB
3.3750 g | Freq: Three times a day (TID) | INTRAVENOUS | Status: DC
  Administered 2020-01-26 – 2020-01-29 (×9): 3.375 g via INTRAVENOUS
  Filled 2020-01-26 (×9): qty 50

## 2020-01-26 MED ORDER — DEXAMETHASONE SODIUM PHOSPHATE 10 MG/ML IJ SOLN
INTRAMUSCULAR | Status: DC | PRN
  Administered 2020-01-26: 4 mg via INTRAVENOUS

## 2020-01-26 MED ORDER — ONDANSETRON HCL 4 MG/2ML IJ SOLN
INTRAMUSCULAR | Status: DC | PRN
  Administered 2020-01-26: 4 mg via INTRAVENOUS

## 2020-01-26 MED ORDER — KETAMINE HCL 50 MG/ML IJ SOLN
INTRAMUSCULAR | Status: DC | PRN
  Administered 2020-01-26: 10 mg via INTRAMUSCULAR
  Administered 2020-01-26: 40 mg via INTRAMUSCULAR

## 2020-01-26 MED ORDER — FENTANYL CITRATE (PF) 250 MCG/5ML IJ SOLN
INTRAMUSCULAR | Status: AC
  Filled 2020-01-26: qty 5

## 2020-01-26 MED ORDER — DIPHENHYDRAMINE HCL 50 MG/ML IJ SOLN
INTRAMUSCULAR | Status: DC | PRN
  Administered 2020-01-26: 12.5 mg via INTRAVENOUS

## 2020-01-26 MED ORDER — DIPHENHYDRAMINE HCL 12.5 MG/5ML PO ELIX: 12.5000 mg | ORAL_SOLUTION | Freq: Four times a day (QID) | ORAL | Status: DC | PRN

## 2020-01-26 MED ORDER — CHLORHEXIDINE GLUCONATE CLOTH 2 % EX PADS
6.0000 | MEDICATED_PAD | Freq: Once | CUTANEOUS | Status: AC
  Administered 2020-01-26: 6 via TOPICAL

## 2020-01-26 MED ORDER — MIDAZOLAM HCL 2 MG/2ML IJ SOLN
INTRAMUSCULAR | Status: DC | PRN
  Administered 2020-01-26: 2 mg via INTRAVENOUS

## 2020-01-26 MED ORDER — PHENYLEPHRINE 40 MCG/ML (10ML) SYRINGE FOR IV PUSH (FOR BLOOD PRESSURE SUPPORT)
PREFILLED_SYRINGE | INTRAVENOUS | Status: DC | PRN
  Administered 2020-01-26: 160 ug via INTRAVENOUS

## 2020-01-26 MED ORDER — PROPOFOL 500 MG/50ML IV EMUL
INTRAVENOUS | Status: DC | PRN
  Administered 2020-01-26: 25 ug/kg/min via INTRAVENOUS

## 2020-01-26 MED ORDER — MORPHINE SULFATE 2 MG/ML IV SOLN
INTRAVENOUS | Status: DC
  Administered 2020-01-26: 21 mg via INTRAVENOUS
  Administered 2020-01-27: 10.5 mg via INTRAVENOUS
  Administered 2020-01-27: 6 mg via INTRAVENOUS
  Administered 2020-01-27: 3 mg via INTRAVENOUS
  Administered 2020-01-27 (×2): 1.5 mg via INTRAVENOUS
  Administered 2020-01-27: 7.5 mg via INTRAVENOUS
  Administered 2020-01-28: 1.5 mg via INTRAVENOUS
  Administered 2020-01-28: 4.5 mg via INTRAVENOUS
  Filled 2020-01-26 (×2): qty 30

## 2020-01-26 MED ORDER — 0.9 % SODIUM CHLORIDE (POUR BTL) OPTIME
TOPICAL | Status: DC | PRN
  Administered 2020-01-26 (×2): 3000 mL

## 2020-01-26 MED ORDER — ONDANSETRON HCL 4 MG/2ML IJ SOLN: 4.0000 mg | Freq: Four times a day (QID) | INTRAMUSCULAR | Status: DC | PRN

## 2020-01-26 MED ORDER — KETAMINE HCL 50 MG/5ML IJ SOSY
PREFILLED_SYRINGE | INTRAMUSCULAR | Status: AC
  Filled 2020-01-26: qty 10

## 2020-01-26 MED ORDER — DEXAMETHASONE SODIUM PHOSPHATE 10 MG/ML IJ SOLN
INTRAMUSCULAR | Status: AC
  Filled 2020-01-26: qty 1

## 2020-01-26 MED ORDER — FENTANYL CITRATE (PF) 100 MCG/2ML IJ SOLN
25.0000 ug | INTRAMUSCULAR | Status: DC | PRN
  Administered 2020-01-26: 50 ug via INTRAVENOUS

## 2020-01-26 MED ORDER — NALOXONE HCL 0.4 MG/ML IJ SOLN
0.4000 mg | INTRAMUSCULAR | Status: DC | PRN
  Filled 2020-01-26: qty 1

## 2020-01-26 MED ORDER — MORPHINE SULFATE (PF) 2 MG/ML IV SOLN
2.0000 mg | Freq: Once | INTRAVENOUS | Status: AC
  Administered 2020-01-26: 2 mg via INTRAVENOUS
  Filled 2020-01-26: qty 1

## 2020-01-26 MED ORDER — ROCURONIUM BROMIDE 10 MG/ML (PF) SYRINGE
PREFILLED_SYRINGE | INTRAVENOUS | Status: DC | PRN
  Administered 2020-01-26: 30 mg via INTRAVENOUS
  Administered 2020-01-26: 50 mg via INTRAVENOUS
  Administered 2020-01-26: 20 mg via INTRAVENOUS

## 2020-01-26 MED ORDER — SUCCINYLCHOLINE CHLORIDE 200 MG/10ML IV SOSY
PREFILLED_SYRINGE | INTRAVENOUS | Status: DC | PRN
  Administered 2020-01-26: 200 mg via INTRAVENOUS

## 2020-01-26 MED ORDER — MIDAZOLAM HCL 2 MG/2ML IJ SOLN
INTRAMUSCULAR | Status: AC
  Filled 2020-01-26: qty 2

## 2020-01-26 MED ORDER — DIPHENHYDRAMINE HCL 50 MG/ML IJ SOLN: 12.5000 mg | Freq: Four times a day (QID) | INTRAMUSCULAR | Status: DC | PRN

## 2020-01-26 MED ORDER — ACETAMINOPHEN 325 MG PO TABS
650.0000 mg | ORAL_TABLET | Freq: Four times a day (QID) | ORAL | Status: DC | PRN
  Administered 2020-01-26: 650 mg via ORAL
  Filled 2020-01-26: qty 2

## 2020-01-26 MED ORDER — DIPHENHYDRAMINE HCL 50 MG/ML IJ SOLN
INTRAMUSCULAR | Status: AC
  Filled 2020-01-26: qty 1

## 2020-01-26 MED ORDER — LIDOCAINE 2% (20 MG/ML) 5 ML SYRINGE
INTRAMUSCULAR | Status: DC | PRN
  Administered 2020-01-26: 60 mg via INTRAVENOUS

## 2020-01-26 MED ORDER — PROPOFOL 10 MG/ML IV BOLUS
INTRAVENOUS | Status: DC | PRN
  Administered 2020-01-26: 140 mg via INTRAVENOUS

## 2020-01-26 MED ORDER — SODIUM CHLORIDE 0.9% FLUSH: 9.0000 mL | INTRAVENOUS | Status: DC | PRN

## 2020-01-26 SURGICAL SUPPLY — 52 items
APL PRP STRL LF DISP 70% ISPRP (MISCELLANEOUS) ×1
BLADE CLIPPER SURG (BLADE) ×2 IMPLANT
BNDG GAUZE ELAST 4 BULKY (GAUZE/BANDAGES/DRESSINGS) ×3 IMPLANT
CANISTER SUCT 3000ML PPV (MISCELLANEOUS) IMPLANT
CHLORAPREP W/TINT 26 (MISCELLANEOUS) ×3 IMPLANT
CLAMP POUCH DRAINAGE QUIET (OSTOMY) ×3 IMPLANT
COVER SURGICAL LIGHT HANDLE (MISCELLANEOUS) ×3 IMPLANT
COVER WAND RF STERILE (DRAPES) ×3 IMPLANT
DRAPE LAPAROSCOPIC ABDOMINAL (DRAPES) ×3 IMPLANT
DRAPE WARM FLUID 44X44 (DRAPES) ×3 IMPLANT
DRSG OPSITE POSTOP 4X10 (GAUZE/BANDAGES/DRESSINGS) IMPLANT
DRSG OPSITE POSTOP 4X8 (GAUZE/BANDAGES/DRESSINGS) IMPLANT
ELECT BLADE 6.5 EXT (BLADE) ×2 IMPLANT
ELECT CAUTERY BLADE 6.4 (BLADE) ×3 IMPLANT
ELECT REM PT RETURN 9FT ADLT (ELECTROSURGICAL) ×3
ELECTRODE REM PT RTRN 9FT ADLT (ELECTROSURGICAL) ×1 IMPLANT
GLOVE BIO SURGEON STRL SZ 6 (GLOVE) ×3 IMPLANT
GLOVE INDICATOR 6.5 STRL GRN (GLOVE) ×3 IMPLANT
GOWN STRL REUS W/ TWL LRG LVL3 (GOWN DISPOSABLE) ×2 IMPLANT
GOWN STRL REUS W/TWL 2XL LVL3 (GOWN DISPOSABLE) ×3 IMPLANT
GOWN STRL REUS W/TWL LRG LVL3 (GOWN DISPOSABLE) ×6
HANDLE SUCTION POOLE (INSTRUMENTS) ×1 IMPLANT
KIT BASIN OR (CUSTOM PROCEDURE TRAY) ×3 IMPLANT
KIT TURNOVER KIT B (KITS) ×3 IMPLANT
LIGASURE IMPACT 36 18CM CVD LR (INSTRUMENTS) ×3 IMPLANT
NS IRRIG 1000ML POUR BTL (IV SOLUTION) ×9 IMPLANT
PACK GENERAL/GYN (CUSTOM PROCEDURE TRAY) ×3 IMPLANT
PAD ABD 8X10 STRL (GAUZE/BANDAGES/DRESSINGS) ×3 IMPLANT
PAD ARMBOARD 7.5X6 YLW CONV (MISCELLANEOUS) ×6 IMPLANT
PENCIL SMOKE EVACUATOR (MISCELLANEOUS) ×3 IMPLANT
POUCHKINS HOLLISTER NEWBORN (OSTOMY) ×3 IMPLANT
RELOAD PROXIMATE 75MM BLUE (ENDOMECHANICALS) ×6 IMPLANT
RELOAD STAPLE 75 3.8 BLU REG (ENDOMECHANICALS) IMPLANT
SPECIMEN JAR LARGE (MISCELLANEOUS) IMPLANT
SPONGE LAP 18X18 RF (DISPOSABLE) ×6 IMPLANT
STAPLER PROXIMATE 75MM BLUE (STAPLE) ×3 IMPLANT
STAPLER VISISTAT 35W (STAPLE) ×3 IMPLANT
SUCTION POOLE HANDLE (INSTRUMENTS) ×3
SUT PDS AB 1 TP1 96 (SUTURE) ×6 IMPLANT
SUT PDS II 0 TP-1 LOOPED 60 (SUTURE) ×6 IMPLANT
SUT VIC AB 2-0 SH 18 (SUTURE) ×3 IMPLANT
SUT VIC AB 3-0 SH 18 (SUTURE) ×3 IMPLANT
SUT VIC AB 3-0 SH 8-18 (SUTURE) ×9 IMPLANT
SUT VICRYL 4-0 PS2 18IN ABS (SUTURE) ×9 IMPLANT
SUT VICRYL AB 2 0 TIES (SUTURE) ×3 IMPLANT
SUT VICRYL AB 3 0 TIES (SUTURE) ×3 IMPLANT
SWAB COLLECTION DEVICE MRSA (MISCELLANEOUS) ×3 IMPLANT
SWAB CULTURE ESWAB REG 1ML (MISCELLANEOUS) ×3 IMPLANT
TOWEL GREEN STERILE (TOWEL DISPOSABLE) ×3 IMPLANT
TOWEL GREEN STERILE FF (TOWEL DISPOSABLE) ×3 IMPLANT
TRAY FOLEY MTR SLVR 16FR STAT (SET/KITS/TRAYS/PACK) ×3 IMPLANT
YANKAUER SUCT BULB TIP NO VENT (SUCTIONS) IMPLANT

## 2020-01-26 NOTE — Progress Notes (Signed)
Day of Surgery   Subjective/Chief Complaint: Called by nursing because patient developed acutely worsened pain and 1 View KUB was concerning for possible free air.  Upright film confirmed.    Objective: Vital signs in last 24 hours: Temp:  [97.9 F (36.6 C)-100.3 F (37.9 C)] 100.3 F (37.9 C) (02/07 0615) Pulse Rate:  [85-117] 116 (02/07 0615) Resp:  [18-19] 19 (02/07 0605) BP: (119-152)/(75-98) 119/75 (02/07 0605) SpO2:  [98 %-99 %] 98 % (02/07 0605) Weight:  [87.2 kg] 87.2 kg (02/06 1327) Last BM Date: 01/24/20  Intake/Output from previous day: 02/06 0701 - 02/07 0700 In: 500 [IV Piggyback:500] Out: 400 [Urine:400] Intake/Output this shift: No intake/output data recorded.  General appearance: alert, cooperative and moderate distress Resp: clear to auscultation bilaterally GI: soft, distended, very tender with guarding worse on left abdomen Extremities: extremities normal, atraumatic, no cyanosis or edema  Lab Results:  Recent Labs    01/25/20 0830 01/26/20 0309  WBC 6.8 1.3*  HGB 12.7* 11.3*  HCT 37.9* 34.3*  PLT 417* 373   BMET Recent Labs    01/25/20 0830 01/26/20 0309  NA 133* 136  K 4.0 3.9  CL 94* 97*  CO2 24 27  GLUCOSE 155* 135*  BUN 19 25*  CREATININE 0.93 1.14  CALCIUM 10.3 9.3   PT/INR No results for input(s): LABPROT, INR in the last 72 hours. ABG No results for input(s): PHART, HCO3 in the last 72 hours.  Invalid input(s): PCO2, PO2  Studies/Results: DG Abd 1 View  Result Date: 01/26/2020 CLINICAL DATA:  64 year old male with possible pneumoperitoneum on supine abdominal radiograph EXAM: ABDOMEN - 1 VIEW COMPARISON:  Film earlier today FINDINGS: Upright abdominal radiograph demonstrates a large amount of pneumoperitoneum underlying the diaphragm. Distended small bowel loops are again noted. IMPRESSION: 1. Large amount of pneumoperitoneum underlying the diaphragm compatible with bowel perforation. 2. Distended small bowel loops again  noted. Critical Value/emergent results were called by telephone at the time of interpretation on 01/26/2020 at 9:23 am to provider Dr. Barry Dienes, who verbally acknowledged these results. Electronically Signed   By: Margarette Canada M.D.   On: 01/26/2020 09:24   DG Abd 1 View  Result Date: 01/26/2020 CLINICAL DATA:  Small-bowel obstruction. EXAM: ABDOMEN - 1 VIEW COMPARISON:  01/25/2020 CT, 01/24/2020 radiograph and prior studies FINDINGS: Distended small bowel loops within the LEFT abdomen are noted. Both sides of the distended small bowel walls are well-defined and can be a sign of pneumoperitoneum. Gas and stool in the RIGHT colon is again identified. No other significant changes identified. IMPRESSION: Distended small bowel loops with both sides of the walls well-defined - pneumoperitoneum is not excluded. Consider upright/decubitus views or CT for further evaluation. Critical Value/emergent results were called by telephone at the time of interpretation on 01/26/2020 at 7:42 am to provider Charleston Ropes, RN, who verbally acknowledged these results. Electronically Signed   By: Margarette Canada M.D.   On: 01/26/2020 07:43   CT Abdomen Pelvis W Contrast  Result Date: 01/25/2020 CLINICAL DATA:  Acute generalized abdominal pain, nausea, vomiting. EXAM: CT ABDOMEN AND PELVIS WITH CONTRAST TECHNIQUE: Multidetector CT imaging of the abdomen and pelvis was performed using the standard protocol following bolus administration of intravenous contrast. CONTRAST:  141mL OMNIPAQUE IOHEXOL 300 MG/ML  SOLN COMPARISON:  None. FINDINGS: Lower chest: No acute abnormality. Hepatobiliary: No focal liver abnormality is seen. No gallstones, gallbladder wall thickening, or biliary dilatation. Pancreas: Unremarkable. No pancreatic ductal dilatation or surrounding inflammatory changes. Spleen: Normal in size without  focal abnormality. Adrenals/Urinary Tract: Adrenal glands are unremarkable. Kidneys are normal, without renal calculi, focal lesion, or  hydronephrosis. There appears to be severe wall thickening involving superior portion of the urinary bladder, with fistulization to the adjacent inflamed sigmoid colon. Stomach/Bowel: Small sliding-type hiatal hernia is noted. Otherwise stomach is unremarkable. There appears to be moderate to severe small bowel dilatation is noted concerning for distal small bowel obstruction. Definite transition zone is not identified. Severe wall thickening of proximal sigmoid colon is noted concerning for diverticulitis. There is dilatation of the more proximal colon concerning for obstruction. Large amount of stool is noted in the cecum. The appendix is clearly visualized. Vascular/Lymphatic: No significant vascular findings are present. No enlarged abdominal or pelvic lymph nodes. Reproductive: Prostate is unremarkable. Other: No abdominal wall hernia or abnormality. No abdominopelvic ascites. Musculoskeletal: Severe multilevel degenerative disc disease is noted in the lumbar spine. No acute osseous abnormality is noted. IMPRESSION: Severe wall thickening of proximal sigmoid colon is noted consistent with sigmoid diverticulitis. There is significant dilatation of the more proximal colon concerning for some degree of obstruction. Large amount of residual stool is noted in the right colon. There is also noted wall thickening involving the superior portion of the urinary bladder with probable fistula formation extending to the inflamed segment of sigmoid colon. Also noted is moderate to severe small bowel dilatation which is concerning for distal small bowel obstruction, although definite transition zone is not identified. Electronically Signed   By: Marijo Conception M.D.   On: 01/25/2020 11:12   DG Abd 2 Views  Result Date: 01/25/2020 CLINICAL DATA:  Constipation, nausea and vomiting. EXAM: ABDOMEN - 2 VIEW COMPARISON:  July 25, 2009 FINDINGS: Air is seen within markedly dilated loops of large bowel, with a large amount of  stool seen within the ascending colon. The haustra markings are preserved throughout the colon. The visualized small bowel loops are normal in caliber. There is no evidence of free air. No radio-opaque calculi or other significant radiographic abnormality is seen. IMPRESSION: Large stool burden with dilated air-filled loops of large bowel. While this may be, in part, chronic in nature, a distal large bowel obstruction cannot be excluded. Electronically Signed   By: Virgina Norfolk M.D.   On: 01/25/2020 15:46    Anti-infectives: Anti-infectives (From admission, onward)   Start     Dose/Rate Route Frequency Ordered Stop   01/25/20 1330  piperacillin-tazobactam (ZOSYN) IVPB 3.375 g     3.375 g 12.5 mL/hr over 240 Minutes Intravenous Every 6 hours 01/25/20 1320 01/30/20 1359   01/25/20 1130  piperacillin-tazobactam (ZOSYN) IVPB 3.375 g     3.375 g 100 mL/hr over 30 Minutes Intravenous  Once 01/25/20 1118 01/25/20 1225      Assessment/Plan: Large bowel obstruction Free air this AM Possible colitis/diverticulitis and possible colovesical fistula H/o XRT and prostate cancer.    Will take emergently to the OR for perforated viscus.   Discussed risks with patient including abscess. Pt informed that he will have ostomy.    Will leave skin open.      LOS: 1 day    [name] 01/26/2020

## 2020-01-26 NOTE — Progress Notes (Signed)
PHARMACY NOTE:  ANTIMICROBIAL RENAL DOSAGE ADJUSTMENT  Current antimicrobial regimen includes a mismatch between antimicrobial dosage and estimated renal function.  As per policy approved by the Pharmacy & Therapeutics and Medical Executive Committees, the antimicrobial dosage will be adjusted accordingly.  Current antimicrobial dosage: Zosyn 3.375 g IV every 6 hours  Indication: Diverticulitis  Renal Function:  Estimated Creatinine Clearance: 71.6 mL/min (by C-G formula based on SCr of 1.14 mg/dL). []      On intermittent HD, scheduled: []      On CRRT    Antimicrobial dosage has been changed to: Zosyn 3.375 g IV every 8 hours    Shahid Flori L. Devin Going, [place] PGY1 Pharmacy Resident [phone] 01/26/20      3:31 PM  Please check AMION for all [facility] phone numbers After 10:00 PM, call the [facility] [phone]

## 2020-01-26 NOTE — Progress Notes (Signed)
REcieved a call from radiollogy,surgery made aware to call radiology. Will continue to monitor patient

## 2020-01-26 NOTE — Anesthesia Postprocedure Evaluation (Signed)
Anesthesia Post Note  Patient: Juan Richards  Procedure(s) Performed: EXPLORATORY LAPAROTOMY  WITH RIGHT HEMICOLECTOMY,  END ILEOSTOMY, AND MUCUS FISTULA. (N/A Abdomen)     Patient location during evaluation: PACU Anesthesia Type: General Level of consciousness: awake Pain management: pain level controlled Respiratory status: spontaneous breathing Cardiovascular status: stable Postop Assessment: no headache Anesthetic complications: no    Last Vitals:  Vitals:   01/26/20 1436 01/26/20 1518  BP: (!) 158/101   Pulse: 94   Resp: 20 20  Temp: 37.2 C   SpO2: 98% 100%    Last Pain:  Vitals:   01/26/20 1518  TempSrc:   PainSc: 10-Worst pain ever                 Dellar Traber

## 2020-01-26 NOTE — Anesthesia Procedure Notes (Addendum)
Procedure Name: Intubation Date/Time: 01/26/2020 12:00 PM Performed by: Bryson Corona, CRNA Pre-anesthesia Checklist: Patient identified, Emergency Drugs available, Suction available and Patient being monitored Patient Re-evaluated:Patient Re-evaluated prior to induction Oxygen Delivery Method: Circle System Utilized Preoxygenation: Pre-oxygenation with 100% oxygen Induction Type: Rapid sequence, Cricoid Pressure applied and IV induction Laryngoscope Size: Mac and 4 Grade View: Grade I Tube type: Oral Tube size: 7.5 mm Number of attempts: 1 Airway Equipment and Method: Stylet and Oral airway Placement Confirmation: ETT inserted through vocal cords under direct vision,  positive ETCO2 and breath sounds checked- equal and bilateral Secured at: 22 cm Tube secured with: Tape Dental Injury: Teeth and Oropharynx as per pre-operative assessment  Comments: Small mouth opening.

## 2020-01-26 NOTE — Progress Notes (Signed)
Gastroenterology Inpatient Follow-up Note   PATIENT IDENTIFICATION  Juan Richards is a 64 y.o. male with a pmh significant for diverticulosis and prior diverticulitis, prostate cancer s/p prostatectomy with subsequent radiation for metastatic disease.  The GI service is consulted for evaluation and management of complicated diverticulitis; now status post perforation with surgical intervention. Hospital Day: 2  SUBJECTIVE  The patient has progressive abdominal pain leading to imaging showing evidence of a perforation.  He was taken to the OR by Dr. Barry Dienes for emergent surgery with operative note reviewed.  I am seeing the patient with the patient's wife at the bedside we briefly went over the operative report.  Patient upset that things progress so quickly when he was feeling okay when I had last seen him last night but understands that things can change and that was my concern about whether he would be able to tolerate just antibiotics.  He is having significant pain and discomfort at this time   OBJECTIVE  Scheduled Inpatient Medications:  . Chlorhexidine Gluconate Cloth  6 each Topical Once  . enoxaparin (LOVENOX) injection  40 mg Subcutaneous Q24H  . insulin aspart  0-15 Units Subcutaneous TID WC  . morphine   Intravenous Q4H   Continuous Inpatient Infusions:  . lactated ringers 100 mL/hr at 01/26/20 1442  . piperacillin-tazobactam (ZOSYN)  IV 3.375 g (01/26/20 1545)   PRN Inpatient Medications: acetaminophen, diphenhydrAMINE **OR** diphenhydrAMINE, ketorolac, naloxone **AND** sodium chloride flush, ondansetron (ZOFRAN) IV, ondansetron (ZOFRAN) IV   Physical Examination  Temp:  [97.6 F (36.4 C)-100.3 F (37.9 C)] 98 F (36.7 C) (02/07 1746) Pulse Rate:  [90-117] 93 (02/07 1746) Resp:  [17-24] 17 (02/07 1746) BP: (119-158)/(75-101) 136/88 (02/07 1746) SpO2:  [95 %-100 %] 100 % (02/07 1746) Temp (24hrs), Avg:98.7 F (37.1 C), Min:97.6 F (36.4 C), Max:100.3 F (37.9 C)  Weight: 87.2 kg GEN: Ill-appearing today, wife at bedside PSYCH: Cooperative EYE: Conjunctivae pink, sclerae anicteric ENT: NG tube in place CV: Tachycardic RESP: No wheezing present GI: Abdomen covered with surgical tape MSK/EXT: Lower extremity edema present SKIN: No jaundice NEURO:  Alert & Oriented x 3, no focal deficits   Review of Data   Laboratory Studies   Recent Labs  Lab 01/26/20 0309  NA 136  K 3.9  CL 97*  CO2 27  BUN 25*  CREATININE 1.14  GLUCOSE 135*  CALCIUM 9.3   Recent Labs  Lab 01/25/20 0830  AST 39  ALT 45*  ALKPHOS 64    Recent Labs  Lab 01/24/20 1429 01/24/20 1429 01/25/20 0830 01/25/20 0830 01/26/20 0309  WBC 4.7   < > 6.8   < > 1.3*  HGB 11.9*   < > 12.7*   < > 11.3*  HCT 34.8*   < > 37.9*   < > 34.3*  PLT 363.0  --  417*  --  373   < > = values in this interval not displayed.   No results for input(s): APTT, INR in the last 168 hours.   Imaging Studies  DG Abd 1 View  Result Date: 01/26/2020 CLINICAL DATA:  64 year old male with possible pneumoperitoneum on supine abdominal radiograph EXAM: ABDOMEN - 1 VIEW COMPARISON:  Film earlier today FINDINGS: Upright abdominal radiograph demonstrates a large amount of pneumoperitoneum underlying the diaphragm. Distended small bowel loops are again noted. IMPRESSION: 1. Large amount of pneumoperitoneum underlying the diaphragm compatible with bowel perforation. 2. Distended small bowel loops again noted. Critical Value/emergent results were called by telephone  at the time of interpretation on 01/26/2020 at 9:23 am to provider Dr. Barry Dienes, who verbally acknowledged these results. Electronically Signed   By: Margarette Canada M.D.   On: 01/26/2020 09:24   DG Abd 1 View  Result Date: 01/26/2020 CLINICAL DATA:  Small-bowel obstruction. EXAM: ABDOMEN - 1 VIEW COMPARISON:  01/25/2020 CT, 01/24/2020 radiograph and prior studies FINDINGS: Distended small bowel loops within the LEFT abdomen are noted. Both sides of  the distended small bowel walls are well-defined and can be a sign of pneumoperitoneum. Gas and stool in the RIGHT colon is again identified. No other significant changes identified. IMPRESSION: Distended small bowel loops with both sides of the walls well-defined - pneumoperitoneum is not excluded. Consider upright/decubitus views or CT for further evaluation. Critical Value/emergent results were called by telephone at the time of interpretation on 01/26/2020 at 7:42 am to provider Charleston Ropes, RN, who verbally acknowledged these results. Electronically Signed   By: Margarette Canada M.D.   On: 01/26/2020 07:43   CT Abdomen Pelvis W Contrast  Result Date: 01/25/2020 CLINICAL DATA:  Acute generalized abdominal pain, nausea, vomiting. EXAM: CT ABDOMEN AND PELVIS WITH CONTRAST TECHNIQUE: Multidetector CT imaging of the abdomen and pelvis was performed using the standard protocol following bolus administration of intravenous contrast. CONTRAST:  183mL OMNIPAQUE IOHEXOL 300 MG/ML  SOLN COMPARISON:  None. FINDINGS: Lower chest: No acute abnormality. Hepatobiliary: No focal liver abnormality is seen. No gallstones, gallbladder wall thickening, or biliary dilatation. Pancreas: Unremarkable. No pancreatic ductal dilatation or surrounding inflammatory changes. Spleen: Normal in size without focal abnormality. Adrenals/Urinary Tract: Adrenal glands are unremarkable. Kidneys are normal, without renal calculi, focal lesion, or hydronephrosis. There appears to be severe wall thickening involving superior portion of the urinary bladder, with fistulization to the adjacent inflamed sigmoid colon. Stomach/Bowel: Small sliding-type hiatal hernia is noted. Otherwise stomach is unremarkable. There appears to be moderate to severe small bowel dilatation is noted concerning for distal small bowel obstruction. Definite transition zone is not identified. Severe wall thickening of proximal sigmoid colon is noted concerning for diverticulitis. There is  dilatation of the more proximal colon concerning for obstruction. Large amount of stool is noted in the cecum. The appendix is clearly visualized. Vascular/Lymphatic: No significant vascular findings are present. No enlarged abdominal or pelvic lymph nodes. Reproductive: Prostate is unremarkable. Other: No abdominal wall hernia or abnormality. No abdominopelvic ascites. Musculoskeletal: Severe multilevel degenerative disc disease is noted in the lumbar spine. No acute osseous abnormality is noted. IMPRESSION: Severe wall thickening of proximal sigmoid colon is noted consistent with sigmoid diverticulitis. There is significant dilatation of the more proximal colon concerning for some degree of obstruction. Large amount of residual stool is noted in the right colon. There is also noted wall thickening involving the superior portion of the urinary bladder with probable fistula formation extending to the inflamed segment of sigmoid colon. Also noted is moderate to severe small bowel dilatation which is concerning for distal small bowel obstruction, although definite transition zone is not identified. Electronically Signed   By: Marijo Conception M.D.   On: 01/25/2020 11:12     ASSESSMENT  Mr. Terhune is a 64 y.o. male with a pmh significant for diverticulosis and prior diverticulitis, prostate cancer s/p prostatectomy with subsequent radiation for metastatic disease.  The GI service is consulted for evaluation and management of complicated diverticulitis; now status post perforation with surgical intervention.  Patient now status post intervention with right hemicolectomy and mucous fistula formation.  We greatly appreciate  the surgical service and their management of the patient at this time.  Unfortunate that the patient was not able to get through this with just antibiotics but not inevitable.  Patient somewhat frustrated by the whole situation but wants to get better and will try to stay positive.    PLAN/RECOMMENDATIONS  Appreciate medicine and surgical services for their excellent care of our patient We will update his primary gastroenterologist Dr. Fuller Plan  GI will defer nutrition plan and overall care plan to surgery but we will be available as needed We will check in intermittently   Please page/call with questions or concerns.   Justice Britain, MD [facility] # [phone]    LOS: 1 day  Irving Copas  01/26/2020, 6:55 PM

## 2020-01-26 NOTE — Op Note (Addendum)
PRE-OPERATIVE DIAGNOSIS: perforated viscus  POST-OPERATIVE DIAGNOSIS:  Perforated cecum, carcinomatosis, sigmoid with dense adhesion to pelvic side wall   PROCEDURE:  Procedure(s): Exploratory laparotomy, right hemicolectomy with end ileostomy, mucus fistula of transverse colon  SURGEON:  Surgeon(s): [name], MD  ASSIST: Lorine Bears, MD  ANESTHESIA:   general  DRAINS: none   LOCAL MEDICATIONS USED:  NONE  SPECIMEN:  Source of Specimen:  right colon, segment of ileum   DISPOSITION OF SPECIMEN:  PATHOLOGY  COUNTS:  YES  DICTATION: .Dragon Dictation  PLAN OF CARE: back to room  PATIENT DISPOSITION:  PACU - hemodynamically stable.  FINDINGS:  Small perforation on cecum. Distended cecum.  Sub-centimeter nodules over all segments of  mesentery and bowel.  1-2 cm firm nodule tethering terminal ileum to right pelvic sidewall.  Sigmoid colon densely adherent to bladder and left/middle pelvic sidewall.  Unclear if there is tumor in this region.    Gross contamination with succus and peritonitis were present upon entrance into the abdomen.    EBL: <100 mL  PROCEDURE:  Patient was identified in the holding area and taken the operating room where he was placed supine on the operating room table.  General endotracheal anesthesia was induced.  Foley catheter was placed.  His abdomen was then prepped and draped in sterile fashion.  A timeout was performed according to the surgical safety checklist.  When all was correct, we continued.  A midline incision was made with the #10 blade.  The subcutaneous tissues were divided with the cautery.  The fascia was elevated with 2 clamps and entered sharply.  The peritoneum was also elevated and entered sharply carefully.  There was murky fluid in the abdomen and a significant amount of air.  The fascial incision was carried out the length of the skin incision.  The fluid was suctioned from the abdomen and cultured.  The abdomen was examined with the  left colon evaluated first.  There was contamination everywhere but no site of perforation was seen on the left or in the sigmoid.  It was immediately apparent that the sigmoid was densely adherent to the sidewall of the pelvis and the bladder.  Also immediately apparent were innumerable nodules on the mesentery of the colon and the small bowel.  There were also several nodules on the bowel itself scattered throughout the abdomen.  The right colon was then examined and the perforation was seen on the anterior surface of the cecum.  This was quite small.  The cecum however was extremely dilated.  The terminal ileum was divided and mobilized.  There was a 1 to soon 2 cm firm nodule tethering the very distal small bowel to the pelvic sidewall.  This was freed up and taken with the right colon.  The colon was mobilized with the LigaSure and the cautery.  Care was taken to avoid the duodenum.  Again this bowel involved many nodules.  The transverse colon was divided of the site that appeared to be free of nodules.  This was divided with the 75 mm GIA stapler.  The LigaSure was used to divide the mesentery, staying closer toward the abdominal wall.  The omentum was taken off of the colon.  The abdomen was then irrigated copiously.  5 to 6 L of warm saline were used.  This was performed until the irrigant returned clear.  The retroperitoneum along the right colon was carefully examined for any evidence of bleeding.  There was none seen.  An appropriate site  for an end ileostomy was identified in the right abdomen.  A oval portion of the skin and subcutaneous tissue were taken with the cautery.  The anterior rectus fascia was exposed and a cruciate incision was made in the anterior fascia.  The muscle fibers were spread longitudinally and the posterior fascia was also divided in cruciate fashion.  The intra-abdominal contents were protected with a laparotomy sponge and metal retractor.  Once the fascia was opened wide  enough for 2 fingerbreadths to pass, the terminal ileum was advanced through the abdominal wall carefully and held in place with a Babcock.  Because of the large bowel obstruction, the transverse colon was brought up as a mucous fistula.  A much smaller incision was made in the skin.  The fascia was opened similarly.  The corner of the proximal portion of the transverse colon was advanced to the abdominal wall.  This was also held in place with a Babcock.  The NG tube placement was confirmed manually.  The fascia was then closed using running #1 looped PDS suture.  Instrument and sponge count was performed and was correct.  The skin was packed open with moist Kerlix and covered with ABDs.  The ostomy was created on the right side and Brooke fashion with 3-0 Vicryl.  A mucous fistula was created on the left with 3-0 Vicryl interrupted sutures.  The mucous fistula was not brooked.  An ostomy appliance was placed on the ileostomy as well as on the mucous fistula.  The patient was then allowed to emerge from anesthesia and was extubated.  He was taken to the recovery room in stable condition.

## 2020-01-26 NOTE — Progress Notes (Signed)
CRITICAL VALUE ALERT  Critical Value:  WBC 1.3  Date & Time Notied:  01/26/20 0523  Provider Notified: on call Amion -X.  Blount  Orders Received/Actions taken: No new orders given at this time

## 2020-01-26 NOTE — Anesthesia Preprocedure Evaluation (Addendum)
Anesthesia Evaluation  Patient identified by MRN, date of birth, ID band Patient awake    Reviewed: Allergy & Precautions, NPO status , Patient's Chart, lab work & pertinent test results  Airway Mallampati: III  TM Distance: >3 FB Neck ROM: Full    Dental  (+) Teeth Intact, Dental Advisory Given   Pulmonary neg pulmonary ROS,    Pulmonary exam normal breath sounds clear to auscultation       Cardiovascular negative cardio ROS Normal cardiovascular exam Rhythm:Regular Rate:Normal     Neuro/Psych negative neurological ROS     GI/Hepatic negative GI ROS, Neg liver ROS,   Endo/Other  Obesity   Renal/GU negative Renal ROS   Prostate cancer     Musculoskeletal negative musculoskeletal ROS (+)   Abdominal   Peds  Hematology  (+) Blood dyscrasia, anemia ,   Anesthesia Other Findings Day of surgery medications reviewed with the patient.  Reproductive/Obstetrics                             Anesthesia Physical  Anesthesia Plan  ASA: III and emergent  Anesthesia Plan: General   Post-op Pain Management:    Induction: Intravenous and Cricoid pressure planned  PONV Risk Score and Plan: 4 or greater and Midazolam, Dexamethasone, Ondansetron, Diphenhydramine and Treatment may vary due to age or medical condition  Airway Management Planned: Oral ETT and LMA  Additional Equipment:   Intra-op Plan:   Post-operative Plan: Extubation in OR  Informed Consent: I have reviewed the patients History and Physical, chart, labs and discussed the procedure including the risks, benefits and alternatives for the proposed anesthesia with the patient or authorized representative who has indicated his/her understanding and acceptance.     Dental advisory given  Plan Discussed with: CRNA, Anesthesiologist and Surgeon  Anesthesia Plan Comments:        Anesthesia Quick Evaluation

## 2020-01-26 NOTE — Transfer of Care (Signed)
Immediate Anesthesia Transfer of Care Note  Patient: Juan Richards  Procedure(s) Performed: EXPLORATORY LAPAROTOMY  WITH RIGHT HEMICOLECTOMY,  END ILEOSTOMY, AND MUCUS FISTULA. (N/A Abdomen)  Patient Location: PACU  Anesthesia Type:General  Level of Consciousness: drowsy and patient cooperative  Airway & Oxygen Therapy: Patient Spontanous Breathing and Patient connected to face mask oxygen  Post-op Assessment: Report given to RN and Post -op Vital signs reviewed and stable  Post vital signs: Reviewed and stable  Last Vitals:  Vitals Value Taken Time  BP 141/95   Temp    Pulse 99   Resp 16   SpO2 95     Last Pain:  Vitals:   01/26/20 0857  TempSrc:   PainSc: Asleep      Patients Stated Pain Goal: 0 ([id number])  Complications: No apparent anesthesia complications

## 2020-01-26 NOTE — Progress Notes (Signed)
Got a phone call from Dr Barry Dienes (operating surgeon) who just performed right hemicolectomy with end ileostomy. Pt did have cecum perforation. However, despite negative PET scan, he has innumerous cancer foci implanted to his peritoneum.   Will consult palliative care

## 2020-01-26 NOTE — Progress Notes (Signed)
PROGRESS NOTE    Juan Richards  QQP:[id number] DOB: Mar 03, 1956 DOA: 01/25/2020 PCP: [name], MD   Brief Narrative:  Juan Richards is a 64 y.o. male with medical history significant of prostate cancer status post robotic assisted prostatectomy 2019, radiation therapy completed in November 2020, prediabetes, diverticulosis on colonoscopy (Sept 2020) with PET scan finding of diverticulitis and microperforation (Oct. 2020) which was medically treated with antibiotics. He's referred from GI office to ER due to c/o abdominal distension, constipation and loss of appetite. KUB has classic "stack of coins" sign c/w SBO.  In ER, CBC normal, CT revealed dilatation of the bowel c/w possible distal small bowel obstruction, thickening of the wall sigmoid colon c/w diverticulitis, thickening of the bladder wall c/w possible fistula bladder-sigmoid colon. He was started on Zosyn and NPO. Dr. Dayna Ramus for Urol and Dr. Prince Solian consulted.  On 01/26/20, pt has persistent fever>100F, tachycardia, wbc dropped to 1.3. he met sepsis criteria, source is due to perforated diverticulitis. Repeated abdominal Xray with upright and decubitus view revealed large amount of pneumoperitoneum underlying diaphragm compatible with bowel perforation; distended small bowel loops again noted. Surgery took patient emergently to OR.    Assessment & Plan:   Principal Problem:   Diverticulitis of large intestine with perforation Active Problems:   UTI (urinary tract infection)   Pre-diabetes   Prostate cancer (HCC)   SBO (small bowel obstruction) (HCC)   Sepsis (HCC)   Tachycardia   Neutropenia with fever (HCC)   Colovesical fistula  Plan: - due to large amount of pneumoperitoneum from diverticulitis with perforation, pt will be sent to OR emergently and he will have ostomy - continue iv zosyn, pending blood culture - IVF hydration - monitor CBC and fever curve. Pt has neutropenic fever today due to sepsis - he may have  colovesical fistula and UTI, which is also covered by zosyn, pending urine culture, urology follow along - prognosis is guarded - NPO   DVT prophylaxis: lovenox  [place] Code Status: full code Family Communication: no family at bedside Disposition Plan:  Pending hospital course   Consultants:   General surgery, urology  Procedures: OR, ostomy  Antimicrobials: iv zosyn  Subjective: Acute worsening abdominal pain, high fever this morning, tachycardia and tachypnea, wbc dropped to 1.3. stat 1 view KUB was concerning for possible free air. RN paged me. So I ordered stat upright and decubitus abdominal Xray which confirmed large amount of free air under the diaphragm. Surgical attending informed.   Objective: Vitals:   01/25/20 1327 01/25/20 2153 01/26/20 0605 01/26/20 0615  BP: (!) 140/98 125/86 119/75   Pulse: 93 90 (!) 117 (!) 116  Resp: _0 Temp: 97.9 F (36.6 C) 98.5 F (36.9 C) 100.3 F (37.9 C) 100.3 F (37.9 C)  TempSrc: Axillary Oral Oral   SpO2: 99% 99% 98%   Weight: 87.2 kg     Height: _1  (1.753 m)       Intake/Output Summary (Last 24 hours) at 01/26/2020 1239 Last data filed at 01/26/2020 0630 Gross per 24 hour  Intake --  Output 200 ml  Net -200 ml   Filed Weights   01/25/20 1327  Weight: 87.2 kg    Examination:  General exam: alert, cooperative and moderate distress Respiratory system: Clear to auscultation. Tachypnea due to pain Cardiovascular system: S1 & S2 heard, tachycardia. No JVD, murmurs, rubs, gallops or clicks. No pedal edema. Gastrointestinal system: soft, distended, very tender with guarding worse on  left abdomen Central nervous system: Alert and oriented. No focal neurological deficits. Extremities: Symmetric 5 x 5 power. Skin: No rashes, lesions or ulcers Psychiatry: Judgement and insight appear normal. Mood & affect appropriate.     Data Reviewed: I have personally reviewed following labs and imaging studies  CBC: Recent Labs   Lab 01/24/20 1429 01/25/20 0830 01/26/20 0309  WBC 4.7 6.8 1.3*  NEUTROABS 2.9 5.9  --   HGB 11.9* 12.7* 11.3*  HCT 34.8* 37.9* 34.3*  MCV 89.5 90.2 90.5  PLT 363.0 417* 071   Basic Metabolic Panel: Recent Labs  Lab 01/24/20 1429 01/25/20 0830 01/26/20 0309  NA 134* 133* 136  K 3.6 4.0 3.9  CL 97 94* 97*  CO2 _0 GLUCOSE 101* 155* 135*  BUN 17 19 25*  CREATININE 0.75 0.93 1.14  CALCIUM 10.1 10.3 9.3   GFR: Estimated Creatinine Clearance: 71.6 mL/min (by C-G formula based on SCr of 1.14 mg/dL). Liver Function Tests: Recent Labs  Lab 01/24/20 1429 01/25/20 0830  AST 15 39  ALT 16 45*  ALKPHOS 56 64  BILITOT 0.7 1.4*  PROT 8.3 7.7  ALBUMIN 4.3 3.9   No results for input(s): LIPASE, AMYLASE in the last 168 hours. No results for input(s): AMMONIA in the last 168 hours. Coagulation Profile: No results for input(s): INR, PROTIME in the last 168 hours. Cardiac Enzymes: No results for input(s): CKTOTAL, CKMB, CKMBINDEX, TROPONINI in the last 168 hours. BNP (last 3 results) No results for input(s): PROBNP in the last 8760 hours. HbA1C: No results for input(s): HGBA1C in the last 72 hours. CBG: Recent Labs  Lab 01/25/20 1334 01/25/20 1645 01/25/20 2151 01/26/20 0838 01/26/20 1033  GLUCAP 115* 94 128* 109* 105*   Lipid Profile: No results for input(s): CHOL, HDL, LDLCALC, TRIG, CHOLHDL, LDLDIRECT in the last 72 hours. Thyroid Function Tests: No results for input(s): TSH, T4TOTAL, FREET4, T3FREE, THYROIDAB in the last 72 hours. Anemia Panel: No results for input(s): VITAMINB12, FOLATE, FERRITIN, TIBC, IRON, RETICCTPCT in the last 72 hours. Sepsis Labs: No results for input(s): PROCALCITON, LATICACIDVEN in the last 168 hours.  Recent Results (from the past 240 hour(s))  Urine culture     Status: Abnormal (Preliminary result)   Collection Time: 01/25/20 11:19 AM   Specimen: Urine, Random  Result Value Ref Range Status   Specimen Description URINE,  RANDOM  Final   Special Requests NONE  Final   Culture (A)  Final    70,000 COLONIES/mL GRAM NEGATIVE RODS IDENTIFICATION AND SUSCEPTIBILITIES TO FOLLOW Performed at [facility], [address], [place], [place] [address]    Report Status PENDING  Incomplete  SARS CORONAVIRUS 2 (TAT 6-24 HRS) Nasopharyngeal Nasopharyngeal Swab     Status: None   Collection Time: 01/25/20 11:25 AM   Specimen: Nasopharyngeal Swab  Result Value Ref Range Status   SARS Coronavirus 2 NEGATIVE NEGATIVE Final    Comment: (NOTE) SARS-CoV-2 target nucleic acids are NOT DETECTED. The SARS-CoV-2 RNA is generally detectable in upper and lower respiratory specimens during the acute phase of infection. Negative results do not preclude SARS-CoV-2 infection, do not rule out co-infections with other pathogens, and should not be used as the sole basis for treatment or other patient management decisions. Negative results must be combined with clinical observations, patient history, and epidemiological information. The expected result is Negative. Fact Sheet for Patients: SugarRoll.be Fact Sheet for Healthcare Providers: https://www.woods-mathews.com/ This test is not yet approved or cleared by the  [place] Architectural technologist and  has been authorized for detection and/or diagnosis of SARS-CoV-2 by FDA under an Print production planner (EUA). This EUA will remain  in effect (meaning this test can be used) for the duration of the COVID-19 declaration under Section 56 4(b)(1) of the Act, 21 U.S.C. section 360bbb-3(b)(1), unless the authorization is terminated or revoked sooner. Performed at [facility], [place] [address], [place], [place] [address]   MRSA PCR Screening     Status: None   Collection Time: 01/26/20  9:32 AM   Specimen: Nasal Mucosa; Nasopharyngeal  Result Value Ref Range Status   MRSA by PCR NEGATIVE NEGATIVE Final    Comment:        The GeneXpert MRSA  Assay (FDA approved for NASAL specimens only), is one component of a comprehensive MRSA colonization surveillance program. It is not intended to diagnose MRSA infection nor to guide or monitor treatment for MRSA infections. Performed at [facility], [place] [address], [place], [place] [address]          Radiology Studies: DG Abd 1 View  Result Date: 01/26/2020 CLINICAL DATA:  64 year old male with possible pneumoperitoneum on supine abdominal radiograph EXAM: ABDOMEN - 1 VIEW COMPARISON:  Film earlier today FINDINGS: Upright abdominal radiograph demonstrates a large amount of pneumoperitoneum underlying the diaphragm. Distended small bowel loops are again noted. IMPRESSION: 1. Large amount of pneumoperitoneum underlying the diaphragm compatible with bowel perforation. 2. Distended small bowel loops again noted. Critical Value/emergent results were called by telephone at the time of interpretation on 01/26/2020 at 9:23 am to provider Dr. Barry Dienes, who verbally acknowledged these results. Electronically Signed   By: Margarette Canada M.D.   On: 01/26/2020 09:24   DG Abd 1 View  Result Date: 01/26/2020 CLINICAL DATA:  Small-bowel obstruction. EXAM: ABDOMEN - 1 VIEW COMPARISON:  01/25/2020 CT, 01/24/2020 radiograph and prior studies FINDINGS: Distended small bowel loops within the LEFT abdomen are noted. Both sides of the distended small bowel walls are well-defined and can be a sign of pneumoperitoneum. Gas and stool in the RIGHT colon is again identified. No other significant changes identified. IMPRESSION: Distended small bowel loops with both sides of the walls well-defined - pneumoperitoneum is not excluded. Consider upright/decubitus views or CT for further evaluation. Critical Value/emergent results were called by telephone at the time of interpretation on 01/26/2020 at 7:42 am to provider Charleston Ropes, RN, who verbally acknowledged these results. Electronically Signed   By: Margarette Canada M.D.   On:  01/26/2020 07:43   CT Abdomen Pelvis W Contrast  Result Date: 01/25/2020 CLINICAL DATA:  Acute generalized abdominal pain, nausea, vomiting. EXAM: CT ABDOMEN AND PELVIS WITH CONTRAST TECHNIQUE: Multidetector CT imaging of the abdomen and pelvis was performed using the standard protocol following bolus administration of intravenous contrast. CONTRAST:  161m OMNIPAQUE IOHEXOL 300 MG/ML  SOLN COMPARISON:  None. FINDINGS: Lower chest: No acute abnormality. Hepatobiliary: No focal liver abnormality is seen. No gallstones, gallbladder wall thickening, or biliary dilatation. Pancreas: Unremarkable. No pancreatic ductal dilatation or surrounding inflammatory changes. Spleen: Normal in size without focal abnormality. Adrenals/Urinary Tract: Adrenal glands are unremarkable. Kidneys are normal, without renal calculi, focal lesion, or hydronephrosis. There appears to be severe wall thickening involving superior portion of the urinary bladder, with fistulization to the adjacent inflamed sigmoid colon. Stomach/Bowel: Small sliding-type hiatal hernia is noted. Otherwise stomach is unremarkable. There appears to be moderate to severe small bowel dilatation is noted concerning for distal small bowel obstruction. Definite  transition zone is not identified. Severe wall thickening of proximal sigmoid colon is noted concerning for diverticulitis. There is dilatation of the more proximal colon concerning for obstruction. Large amount of stool is noted in the cecum. The appendix is clearly visualized. Vascular/Lymphatic: No significant vascular findings are present. No enlarged abdominal or pelvic lymph nodes. Reproductive: Prostate is unremarkable. Other: No abdominal wall hernia or abnormality. No abdominopelvic ascites. Musculoskeletal: Severe multilevel degenerative disc disease is noted in the lumbar spine. No acute osseous abnormality is noted. IMPRESSION: Severe wall thickening of proximal sigmoid colon is noted consistent with  sigmoid diverticulitis. There is significant dilatation of the more proximal colon concerning for some degree of obstruction. Large amount of residual stool is noted in the right colon. There is also noted wall thickening involving the superior portion of the urinary bladder with probable fistula formation extending to the inflamed segment of sigmoid colon. Also noted is moderate to severe small bowel dilatation which is concerning for distal small bowel obstruction, although definite transition zone is not identified. Electronically Signed   By: Marijo Conception M.D.   On: 01/25/2020 11:12   DG Abd 2 Views  Result Date: 01/25/2020 CLINICAL DATA:  Constipation, nausea and vomiting. EXAM: ABDOMEN - 2 VIEW COMPARISON:  July 25, 2009 FINDINGS: Air is seen within markedly dilated loops of large bowel, with a large amount of stool seen within the ascending colon. The haustra markings are preserved throughout the colon. The visualized small bowel loops are normal in caliber. There is no evidence of free air. No radio-opaque calculi or other significant radiographic abnormality is seen. IMPRESSION: Large stool burden with dilated air-filled loops of large bowel. While this may be, in part, chronic in nature, a distal large bowel obstruction cannot be excluded. Electronically Signed   By: Virgina Norfolk M.D.   On: 01/25/2020 15:46        Scheduled Meds: . Chlorhexidine Gluconate Cloth  6 each Topical Once  . [MAR Hold] enoxaparin (LOVENOX) injection  40 mg Subcutaneous Q24H  . [MAR Hold] insulin aspart  0-15 Units Subcutaneous TID WC   Continuous Infusions: . lactated ringers    . [MAR Hold] piperacillin-tazobactam (ZOSYN)  IV 3.375 g (01/26/20 0740)     LOS: 1 day    Time spent: total 35 min on this encounter with >50% of time on direct patient, care coordination with specialty, and plan formulation.     Paticia Stack, MD [facility] Pager [phone]  If 7PM-7AM, please contact  night-coverage [email or web address] Password [facility] 01/26/2020, 12:39 PM

## 2020-01-27 DIAGNOSIS — K631 Perforation of intestine (nontraumatic): Secondary | ICD-10-CM | POA: Diagnosis not present

## 2020-01-27 LAB — CBC WITH DIFFERENTIAL/PLATELET
Abs Immature Granulocytes: 0.05 10*3/uL (ref 0.00–0.07)
Basophils Absolute: 0 10*3/uL (ref 0.0–0.1)
Basophils Relative: 1 %
Eosinophils Absolute: 0 10*3/uL (ref 0.0–0.5)
Eosinophils Relative: 0 %
HCT: 32.6 % — ABNORMAL LOW (ref 39.0–52.0)
Hemoglobin: 10.7 g/dL — ABNORMAL LOW (ref 13.0–17.0)
Immature Granulocytes: 1 %
Lymphocytes Relative: 5 %
Lymphs Abs: 0.2 10*3/uL — ABNORMAL LOW (ref 0.7–4.0)
MCH: 30.1 pg (ref 26.0–34.0)
MCHC: 32.8 g/dL (ref 30.0–36.0)
MCV: 91.8 fL (ref 80.0–100.0)
Monocytes Absolute: 0.2 10*3/uL (ref 0.1–1.0)
Monocytes Relative: 3 %
Neutro Abs: 4.1 10*3/uL (ref 1.7–7.7)
Neutrophils Relative %: 90 %
Platelets: 286 10*3/uL (ref 150–400)
RBC: 3.55 MIL/uL — ABNORMAL LOW (ref 4.22–5.81)
RDW: 13.8 % (ref 11.5–15.5)
WBC: 4.6 10*3/uL (ref 4.0–10.5)
nRBC: 0 % (ref 0.0–0.2)

## 2020-01-27 LAB — BASIC METABOLIC PANEL
Anion gap: 11 (ref 5–15)
BUN: 21 mg/dL (ref 8–23)
CO2: 27 mmol/L (ref 22–32)
Calcium: 8.9 mg/dL (ref 8.9–10.3)
Chloride: 95 mmol/L — ABNORMAL LOW (ref 98–111)
Creatinine, Ser: 0.99 mg/dL (ref 0.61–1.24)
GFR calc Af Amer: >60 mL/min (ref >60)
GFR calc non Af Amer: >60 mL/min (ref >60)
Glucose, Bld: 121 mg/dL — ABNORMAL HIGH (ref 70–99)
Potassium: 3.9 mmol/L (ref 3.5–5.1)
Sodium: 133 mmol/L — ABNORMAL LOW (ref 135–145)

## 2020-01-27 LAB — URINE CULTURE: Culture: 70000 — AB

## 2020-01-27 LAB — GLUCOSE, CAPILLARY
Glucose-Capillary: 109 mg/dL — ABNORMAL HIGH (ref 70–99)
Glucose-Capillary: 104 mg/dL — ABNORMAL HIGH (ref 70–99)
Glucose-Capillary: 117 mg/dL — ABNORMAL HIGH (ref 70–99)
Glucose-Capillary: 94 mg/dL (ref 70–99)

## 2020-01-27 MED ORDER — POTASSIUM CHLORIDE IN NACL 20-0.9 MEQ/L-% IV SOLN
INTRAVENOUS | Status: DC
  Filled 2020-01-27 (×8): qty 1000

## 2020-01-27 MED ORDER — PHENOL 1.4 % MT LIQD
1.0000 | OROMUCOSAL | Status: DC | PRN
  Administered 2020-01-27: 1 via OROMUCOSAL
  Filled 2020-01-27: qty 177

## 2020-01-27 MED ORDER — CHLORHEXIDINE GLUCONATE CLOTH 2 % EX PADS
6.0000 | MEDICATED_PAD | Freq: Every day | CUTANEOUS | Status: DC
  Administered 2020-01-27 – 2020-01-31 (×5): 6 via TOPICAL

## 2020-01-27 MED ORDER — METHOCARBAMOL 1000 MG/10ML IJ SOLN
500.0000 mg | Freq: Three times a day (TID) | INTRAVENOUS | Status: DC
  Administered 2020-01-27 – 2020-01-28 (×3): 500 mg via INTRAVENOUS
  Filled 2020-01-27 (×6): qty 5

## 2020-01-27 MED ORDER — MENTHOL 3 MG MT LOZG: 1.0000 | LOZENGE | OROMUCOSAL | Status: DC | PRN

## 2020-01-27 MED ORDER — ACETAMINOPHEN 10 MG/ML IV SOLN
1000.0000 mg | Freq: Four times a day (QID) | INTRAVENOUS | Status: AC
  Administered 2020-01-27 – 2020-01-28 (×4): 1000 mg via INTRAVENOUS
  Filled 2020-01-27 (×4): qty 100

## 2020-01-27 NOTE — Consult Note (Signed)
[place] Nurse ostomy consult note Stoma type/location:  RLQ; 1 1/2" ileostomy  LLQ; 1 3/8" mucous fistula Stomal assessment/size: see above; both area budded and pink, moist Peristomal assessment: NA Treatment options for stomal/peristomal skin: NA Output bloody in both pouches  Ostomy pouching:  2 pc 2 3/4" in place from the [place] in place on the MF Education provided:  Explained role of ostomy nurse and creation of stoma  Explained stoma characteristics (budded, flush, color, texture, care)  Patient not feeling well today, NG in place still.  Just up with PT  Enrolled patient in [facility] program: Yes [place] Nurse will follow along with you for continued support with ostomy teaching and care [place] MSN, [place], [place], [place], [place]

## 2020-01-27 NOTE — Telephone Encounter (Signed)
Thank you for the followup.

## 2020-01-27 NOTE — Progress Notes (Signed)
[facility] Progress Note  1 Day Post-Op  Subjective: CC: pain Patient reports abdominal pain is 5/10, improved from prior to surgery but he is very sore any time he tries to move. Patient denies nausea and feels less bloated. He is disappointed about ileostomy/mucus fistula but we discussed that he will learn how to care for both in the hospital. 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 He reports prior to all this he was having some urinary incontinence but it had been improving some. We discussed importance of mobilization post-op and patient understands.   Objective: Vital signs in last 24 hours: Temp:  [97.6 F (36.4 C)-98.9 F (37.2 C)] 98.4 F (36.9 C) (02/08 0525) Pulse Rate:  [93-100] 98 (02/08 0525) Resp:  [16-24] 19 (02/08 0605) BP: (118-158)/(84-101) 131/85 (02/08 0525) SpO2:  [95 %-100 %] 100 % (02/08 0605) Last BM Date: 01/24/20  Intake/Output from previous day: 02/07 0701 - 02/08 0700 In: 2959.9 [I.V.:2710.7; IV Piggyback:249.2] Out: 1055 [Urine:600; Emesis/NG output:400; Stool:5; Blood:50] Intake/Output this shift: No intake/output data recorded.  PE: Gen:  Alert, NAD, pleasant Card:  Regular rate and rhythm Pulm:  Normal effort, clear to auscultation bilaterally Abd: Soft, appropriately tender, mildly distended, +BS hypoactive, midline wound clean with small amount bloody drainage, ileostomy viable with SS fluid in bag, Mucus fistula viable with small amount drainage in bag   Skin: warm and dry, no rashes  Psych: A&Ox3   Lab Results:  Recent Labs    01/26/20 0309 01/27/20 0242  WBC 1.3* 4.6  HGB 11.3* 10.7*  HCT 34.3* 32.6*  PLT 373 286   BMET Recent Labs    01/26/20 0309 01/27/20 0242  NA 136 133*  K 3.9 3.9  CL 97* 95*  CO2 27 27  GLUCOSE 135* 121*  BUN 25* 21  CREATININE 1.14 0.99    CALCIUM 9.3 8.9   PT/INR No results for input(s): LABPROT, INR in the last 72 hours. CMP     Component Value Date/Time   NA 133 (L) 01/27/2020 0242   K 3.9 01/27/2020 0242   CL 95 (L) 01/27/2020 0242   CO2 27 01/27/2020 0242   GLUCOSE 121 (H) 01/27/2020 0242   BUN 21 01/27/2020 0242   CREATININE 0.99 01/27/2020 0242   CALCIUM 8.9 01/27/2020 0242   PROT 7.7 01/25/2020 0830   ALBUMIN 3.9 01/25/2020 0830   AST 39 01/25/2020 0830   ALT 45 (H) 01/25/2020 0830   ALKPHOS 64 01/25/2020 0830   BILITOT 1.4 (H) 01/25/2020 0830   GFRNONAA >60 01/27/2020 0242   GFRAA >60 01/27/2020 0242   Lipase  No results found for: LIPASE     Studies/Results: DG Abd 1 View  Result Date: 01/26/2020 CLINICAL DATA:  64 year old male with possible pneumoperitoneum on supine abdominal radiograph EXAM: ABDOMEN - 1 VIEW COMPARISON:  Film earlier today FINDINGS: Upright abdominal radiograph demonstrates a large amount of pneumoperitoneum underlying the diaphragm. Distended small bowel loops are again noted. IMPRESSION: 1. Large amount of pneumoperitoneum underlying the diaphragm compatible with bowel perforation. 2. Distended small bowel loops again noted. Critical Value/emergent results were called by telephone at the time of interpretation on 01/26/2020 at 9:23 am to provider Dr. Barry Dienes, who verbally acknowledged these results. Electronically Signed   By: Margarette Canada M.D.   On: 01/26/2020 09:24   DG Abd 1 View  Result  Date: 01/26/2020 CLINICAL DATA:  Small-bowel obstruction. EXAM: ABDOMEN - 1 VIEW COMPARISON:  01/25/2020 CT, 01/24/2020 radiograph and prior studies FINDINGS: Distended small bowel loops within the LEFT abdomen are noted. Both sides of the distended small bowel walls are well-defined and can be a sign of pneumoperitoneum. Gas and stool in the RIGHT colon is again identified. No other significant changes identified. IMPRESSION: Distended small bowel loops with both sides of the walls well-defined -  pneumoperitoneum is not excluded. Consider upright/decubitus views or CT for further evaluation. Critical Value/emergent results were called by telephone at the time of interpretation on 01/26/2020 at 7:42 am to provider Charleston Ropes, RN, who verbally acknowledged these results. Electronically Signed   By: Margarette Canada M.D.   On: 01/26/2020 07:43   CT Abdomen Pelvis W Contrast  Result Date: 01/25/2020 CLINICAL DATA:  Acute generalized abdominal pain, nausea, vomiting. EXAM: CT ABDOMEN AND PELVIS WITH CONTRAST TECHNIQUE: Multidetector CT imaging of the abdomen and pelvis was performed using the standard protocol following bolus administration of intravenous contrast. CONTRAST:  180mL OMNIPAQUE IOHEXOL 300 MG/ML  SOLN COMPARISON:  None. FINDINGS: Lower chest: No acute abnormality. Hepatobiliary: No focal liver abnormality is seen. No gallstones, gallbladder wall thickening, or biliary dilatation. Pancreas: Unremarkable. No pancreatic ductal dilatation or surrounding inflammatory changes. Spleen: Normal in size without focal abnormality. Adrenals/Urinary Tract: Adrenal glands are unremarkable. Kidneys are normal, without renal calculi, focal lesion, or hydronephrosis. There appears to be severe wall thickening involving superior portion of the urinary bladder, with fistulization to the adjacent inflamed sigmoid colon. Stomach/Bowel: Small sliding-type hiatal hernia is noted. Otherwise stomach is unremarkable. There appears to be moderate to severe small bowel dilatation is noted concerning for distal small bowel obstruction. Definite transition zone is not identified. Severe wall thickening of proximal sigmoid colon is noted concerning for diverticulitis. There is dilatation of the more proximal colon concerning for obstruction. Large amount of stool is noted in the cecum. The appendix is clearly visualized. Vascular/Lymphatic: No significant vascular findings are present. No enlarged abdominal or pelvic lymph nodes.  Reproductive: Prostate is unremarkable. Other: No abdominal wall hernia or abnormality. No abdominopelvic ascites. Musculoskeletal: Severe multilevel degenerative disc disease is noted in the lumbar spine. No acute osseous abnormality is noted. IMPRESSION: Severe wall thickening of proximal sigmoid colon is noted consistent with sigmoid diverticulitis. There is significant dilatation of the more proximal colon concerning for some degree of obstruction. Large amount of residual stool is noted in the right colon. There is also noted wall thickening involving the superior portion of the urinary bladder with probable fistula formation extending to the inflamed segment of sigmoid colon. Also noted is moderate to severe small bowel dilatation which is concerning for distal small bowel obstruction, although definite transition zone is not identified. Electronically Signed   By: Marijo Conception M.D.   On: 01/25/2020 11:12    Anti-infectives: Anti-infectives (From admission, onward)   Start     Dose/Rate Route Frequency Ordered Stop   01/26/20 1600  piperacillin-tazobactam (ZOSYN) IVPB 3.375 g     3.375 g 12.5 mL/hr over 240 Minutes Intravenous Every 8 hours 01/26/20 1532     01/25/20 1330  piperacillin-tazobactam (ZOSYN) IVPB 3.375 g  Status:  Discontinued     3.375 g 12.5 mL/hr over 240 Minutes Intravenous Every 6 hours 01/25/20 1320 01/26/20 1532   01/25/20 1130  piperacillin-tazobactam (ZOSYN) IVPB 3.375 g     3.375 g 100 mL/hr over 30 Minutes Intravenous  Once 01/25/20 1118 01/25/20 1225  Assessment/Plan Hx of prostate cancer s/p XRT  LBO with cecal perforation Carcinomatosis S/p exploratory laparotomy, right hemicolectomy, end ileostomy, mucus fistula of transverse colon - 01/26/20 Dr. Barry Dienes - POD#1 - surgical path pending - patient with 400cc bilious drainage out from NGT, try clamping today and may be able to remove NGT later this afternoon - PT/OT, start to mobilize - if mobilizing  well may remove foley today vs tomorrow AM - add IV robaxin and tylenol for pain control - continue IV abx for now, follow WBC   FEN: sips/chips, clamp NGT, IVF VTE: SCDs, lovenox ID: IV zosyn 2/6>> Foley: present Follow up: Dr. Barry Dienes   LOS: 2 days    Brigid Re , [facility] 01/27/2020, 8:30 AM Please see Amion for pager number during day hours 7:00am-4:30pm

## 2020-01-27 NOTE — Plan of Care (Signed)

## 2020-01-27 NOTE — Progress Notes (Signed)
Eval complete, formal note pending. At this time strongly recommend CIR. Windell Norfolk, DPT, PN1   Supplemental Physical Therapist [facility]    Pager [phone] Acute Rehab Office [phone]

## 2020-01-27 NOTE — Progress Notes (Signed)
Rehab Admissions Coordinator Note:  Per PT recommendation, this patient was screened by Raechel Ache for appropriateness for an Inpatient Acute Rehab Consult.  At this time, we are recommending Inpatient Rehab consult.   AC will place consult order per protocol.   Raechel Ache 01/27/2020, 2:22 PM  I can be reached at [phone].

## 2020-01-27 NOTE — Progress Notes (Signed)
Pt's NGT came out inadvertently while moving in chair.  It has been clamped all day and he has no nausea.  Let Dr. Kae Heller know, will continue to monitor.

## 2020-01-27 NOTE — Progress Notes (Addendum)
Patient seen with Dr. Fuller Plan.  Patient's clinical course leading up to hospitalization reviewed with the patient as well as events that have occurred while hospitalized.  Reviewed Sept 2020 colonoscopy findings. He completed radiation therapy for recurrent prostate cancer on Jan 27.  He noted worsening abdominal distention, constipation and then obstipation. Unfortunately he suffered a cecal perforation from colonic obstruction apparently related to diverticulitis, diverticular stricture. Underwent ex lap, right hemicolectomy with end ileostomy, and mucus fistula of transverse colon by Dr. Barry Dienes on Feb 7. Peritonitis and mesenteric / bowel nodules noted at ex lap. He is doing well in his early post op course. Appreciate medicine and surgical service for their excellent ongoing care. Please call us if we can assist in management. GI signing off for now.  We will see him in follow-up in the office as needed.

## 2020-01-27 NOTE — Evaluation (Signed)
Physical Therapy Evaluation Patient Details Name: Juan Richards MRN: [id number] DOB: 09-15-1956 Today's Date: 01/27/2020   History of Present Illness  64yo male c/o increased bowel pain on 2/6, referred to the ED by GI team due to abdominal extension and loss of appetite, X-ray on 2/6 showed SBO. Developed signs of sepsis on 2/7 and X-ray then showed concern for bowel perforation. Taken to the OR on an emergent basis and received exploratory laparatomy, R hemicolectomy, end ileostomy, and mucous fistula on 2/7. PMH colon diverticulitis with perforation, prostate cancer with hx of radiation treatments, lumbar surgery, prostatectomy  Clinical Impression   Patient received in bed, very pleasant and willing to participate in session but anxious about pain. See below for mobility/assist levels. Generally able to mobilize well with assist of MinAx2 during session, did need cues for safety to maintain precautions with abdominal incisions and lines/drains, also extended time/increased effort with all mobility. Fatigued after short distance ambulation in the room. RN present and assisted with line management during session, aware of assist levels for return to bed. He was left up in the chair with all needs met this afternoon. Currently feel he may be a great candidate for CIR!     Follow Up Recommendations CIR;Supervision/Assistance - 24 hour    Equipment Recommendations  Rolling walker with 5" wheels;3in1 (PT)    Recommendations for Other Services       Precautions / Restrictions Precautions Precautions: Fall;Other (comment) Precaution Comments: abdominal incision, colostomy/ileostomy bags, multiple IVs, NG tube (but may be removed today) Restrictions Weight Bearing Restrictions: No      Mobility  Bed Mobility Overal bed mobility: Needs Assistance Bed Mobility: Rolling;Sidelying to Sit Rolling: Min assist Sidelying to sit: Min assist;HOB elevated       General bed mobility comments:  MinA to roll completely over to the side, then MinA and use of rails to boost trunk to upright at EOB, HOB mildly elevated  Transfers Overall transfer level: Needs assistance Equipment used: Rolling walker (2 wheeled) Transfers: Sit to/from Stand Sit to Stand: Min assist;+2 physical assistance;+2 safety/equipment         General transfer comment: MinAx2 to boost to full standing position and gain balance; RN helped with transfer and line management  Ambulation/Gait Ambulation/Gait assistance: Min guard;Min assist Gait Distance (Feet): 6 Feet Assistive device: Rolling walker (2 wheeled) Gait Pattern/deviations: Step-through pattern;Decreased step length - right;Decreased step length - left;Antalgic Gait velocity: decreased   General Gait Details: slow but steady with RW, required Min guard-MinA for balance especially with turning and for assist with lines. Limited gait distance due to fatigue and pain  Stairs            Wheelchair Mobility    Modified Rankin (Stroke Patients Only)       Balance Overall balance assessment: Needs assistance Sitting-balance support: Bilateral upper extremity supported;Feet supported Sitting balance-Leahy Scale: Good Sitting balance - Comments: S for safety   Standing balance support: Bilateral upper extremity supported;During functional activity Standing balance-Leahy Scale: Poor Standing balance comment: heavy reliance on BUE support                             Pertinent Vitals/Pain Pain Assessment: Faces Faces Pain Scale: Hurts a little bit Pain Location: generalized discomfort Pain Descriptors / Indicators: Aching;Discomfort Pain Intervention(s): Limited activity within patient's tolerance;Monitored during session    Home Living Family/patient expects to be discharged to:: Private residence Living Arrangements: Spouse/significant other  Available Help at Discharge: Family;Available 24 hours/day Type of Home:  House Home Access: Stairs to enter Entrance Stairs-Rails: None Entrance Stairs-Number of Steps: 1 STE in the back no rails Home Layout: Multi-level Home Equipment: None Additional Comments: very independent before, this surgery and resultant deconditioning is new to him    Prior Function Level of Independence: Independent               Hand Dominance        Extremity/Trunk Assessment   Upper Extremity Assessment Upper Extremity Assessment: Defer to OT evaluation    Lower Extremity Assessment Lower Extremity Assessment: Generalized weakness    Cervical / Trunk Assessment Cervical / Trunk Assessment: Normal  Communication   Communication: No difficulties  Cognition Arousal/Alertness: Awake/alert Behavior During Therapy: WFL for tasks assessed/performed;Anxious Overall Cognitive Status: Within Functional Limits for tasks assessed                                 General Comments: slightly anxious about mobility due to worries over pain, otherwise very pleasant and motivated, will do whatever is asked of him      General Comments      Exercises     Assessment/Plan    PT Assessment Patient needs continued PT services  PT Problem List Decreased strength;Decreased knowledge of use of DME;Decreased activity tolerance;Decreased balance;Decreased knowledge of precautions;Pain;Decreased mobility       PT Treatment Interventions DME instruction;Balance training;Gait training;Stair training;Functional mobility training;Patient/family education;Therapeutic activities;Therapeutic exercise    PT Goals (Current goals can be found in the Care Plan section)  Acute Rehab PT Goals Patient Stated Goal: get stronger, rehab before home PT Goal Formulation: With patient Time For Goal Achievement: 01/27/20 Potential to Achieve Goals: Good    Frequency Min 3X/week   Barriers to discharge        Co-evaluation               AM-PAC PT "6 Clicks" Mobility   Outcome Measure Help needed turning from your back to your side while in a flat bed without using bedrails?: A Little Help needed moving from lying on your back to sitting on the side of a flat bed without using bedrails?: A Little Help needed moving to and from a bed to a chair (including a wheelchair)?: A Little Help needed standing up from a chair using your arms (e.g., wheelchair or bedside chair)?: A Lot Help needed to walk in hospital room?: A Little Help needed climbing 3-5 steps with a railing? : A Lot 6 Click Score: 16    End of Session   Activity Tolerance: Patient tolerated treatment well Patient left: in chair;with call bell/phone within reach Nurse Communication: Mobility status PT Visit Diagnosis: Difficulty in walking, not elsewhere classified (R26.2);Muscle weakness (generalized) (M62.81)    Time: 8485-9276 PT Time Calculation (min) (ACUTE ONLY): 33 min   Charges:   PT Evaluation $PT Eval Moderate Complexity: 1 Mod PT Treatments $Therapeutic Activity: 8-22 mins        Windell Norfolk, DPT, PN1   Supplemental Physical Therapist [place]    Pager [phone] Acute Rehab Office [phone]

## 2020-01-27 NOTE — Progress Notes (Signed)
PROGRESS NOTE    Juan Richards  KJZ:[id number] DOB: 1955-12-30 DOA: 01/25/2020 PCP: [name], MD     Brief Narrative:  Patient is a 64 year old male with a past medical history of prostate cancer status post radiation and prostatectomy who presented with increasing abdominal pain on 01/25/2020.  His GI team sent him in due to abdominal distention and loss of appetite.  Abdominal x-ray showed findings consistent with small bowel obstruction.  Patient met criteria for sepsis on 2/7 with persistent fever, tachycardia and drop of white blood cell count to 1.3.  Repeat abdominal x-ray showed air underneath the diaphragm concerning for perforation he was taken to the operating room on an emergent basis.  He was found to have a perforated cecum in addition to small nodules over the segments of the mesentery and bowel concerning for malignancy.  The sigmoid colon was adhered to the bladder and wall of the pelvis making unclear if there was a mass.    New events last 24 hours / Subjective: Patient reports feeling better since the procedure but is still in pain.  Appetite is not back yet. No fevers. Has many questions for surgery team.   Assessment & Plan:   Principal Problem:   Bowel perforation ([place])  S/p surgery on 2/7  Appreciate Gen surg recs  Ofirmev and Robaxin per primary team  Toradol 30 mg every 6 hours as needed  Morphine PCA  Await surgical path  Active Problems:   Hyponatremia  Monitor BMP    UTI (urinary tract infection)  Zosyn 3.37 g every 8 hours    Pre-diabetes  Sliding scale insulin    Diverticulitis of large intestine with perforation   SBO (small bowel obstruction) ([facility])  NG tube in place  Appreciate general surgery    Sepsis ([place])  Resolved  Monitor CBC    Tachycardia  Resolved    Neutropenia with fever ([place])  Resolved    Colovesical fistula   DVT prophylaxis: Lovenox Code Status: Full Family Communication: Self Disposition Plan: Home;  coming from home, barriers include recovery time from intraprocedure   Consultants:   General surgery  Gastroenterology signed off  Procedures:  Exploratory laparotomy, right hemicolectomy, end ileostomy, mucus fistula of transverse colon on 01/26/20  Antimicrobials:  Anti-infectives (From admission, onward)   Start     Dose/Rate Route Frequency Ordered Stop   01/26/20 1600  piperacillin-tazobactam (ZOSYN) IVPB 3.375 g     3.375 g 12.5 mL/hr over 240 Minutes Intravenous Every 8 hours 01/26/20 1532     01/25/20 1330  piperacillin-tazobactam (ZOSYN) IVPB 3.375 g  Status:  Discontinued     3.375 g 12.5 mL/hr over 240 Minutes Intravenous Every 6 hours 01/25/20 1320 01/26/20 1532   01/25/20 1130  piperacillin-tazobactam (ZOSYN) IVPB 3.375 g     3.375 g 100 mL/hr over 30 Minutes Intravenous  Once 01/25/20 1118 01/25/20 1225       Objective: Vitals:   01/27/20 0212 01/27/20 0525 01/27/20 0605 01/27/20 0917  BP: 129/84 131/85    Pulse: 96 98    Resp: '20 20 19 17  '$ Temp: 98.4 F (36.9 C) 98.4 F (36.9 C)    TempSrc: Oral     SpO2: 100% 100% 100% 99%  Weight:      Height:        Intake/Output Summary (Last 24 hours) at 01/27/2020 1243 Last data filed at 01/27/2020 0602 Gross per 24 hour  Intake 2959.87 ml  Output 1005 ml  Net 1954.87  ml   Filed Weights   01/25/20 1327  Weight: 87.2 kg    Examination:  General exam: Appears calm and comfortable  Respiratory system: Clear to auscultation. Respiratory effort normal. No respiratory distress. No conversational dyspnea.  Cardiovascular system: S1 & S2 heard, RRR. No murmurs. No pedal edema. Gastrointestinal system: 2 osteomy bags present, midline wound dressed, c/d Central nervous system: Alert and oriented. No focal neurological deficits. Speech clear.  Extremities: Symmetric in appearance  Skin: No rashes, lesions or ulcers on exposed skin  Psychiatry: Judgement and insight appear normal. Mood & affect appropriate.   Data  Reviewed: I have personally reviewed following labs and imaging studies  CBC: Recent Labs  Lab 01/24/20 1429 01/25/20 0830 01/26/20 0309 01/27/20 0242  WBC 4.7 6.8 1.3* 4.6  NEUTROABS 2.9 5.9  --  4.1  HGB 11.9* 12.7* 11.3* 10.7*  HCT 34.8* 37.9* 34.3* 32.6*  MCV 89.5 90.2 90.5 91.8  PLT 363.0 417* 373 945   Basic Metabolic Panel: Recent Labs  Lab 01/24/20 1429 01/25/20 0830 01/26/20 0309 01/27/20 0242  NA 134* 133* 136 133*  K 3.6 4.0 3.9 3.9  CL 97 94* 97* 95*  CO2 '28 24 27 27  '$ GLUCOSE 101* 155* 135* 121*  BUN 17 19 25* 21  CREATININE 0.75 0.93 1.14 0.99  CALCIUM 10.1 10.3 9.3 8.9   GFR: Estimated Creatinine Clearance: 82.4 mL/min (by C-G formula based on SCr of 0.99 mg/dL). Liver Function Tests: Recent Labs  Lab 01/24/20 1429 01/25/20 0830  AST 15 39  ALT 16 45*  ALKPHOS 56 64  BILITOT 0.7 1.4*  PROT 8.3 7.7  ALBUMIN 4.3 3.9   CBG: Recent Labs  Lab 01/26/20 0838 01/26/20 1033 01/26/20 1748 01/26/20 2058 01/27/20 0813  GLUCAP 109* 105* 133* 117* 109*   Recent Results (from the past 240 hour(s))  Urine culture     Status: Abnormal   Collection Time: 01/25/20 11:19 AM   Specimen: Urine, Random  Result Value Ref Range Status   Specimen Description URINE, RANDOM  Final   Special Requests   Final    NONE Performed at [facility], [place] [address], [place], [place] [address]    Culture 70,000 COLONIES/mL ESCHERICHIA COLI (A)  Final   Report Status 01/27/2020 FINAL  Final   Organism ID, Bacteria ESCHERICHIA COLI (A)  Final      Susceptibility   Escherichia coli - MIC*    AMPICILLIN >=32 RESISTANT Resistant     CEFAZOLIN <=4 SENSITIVE Sensitive     CEFTRIAXONE <=0.25 SENSITIVE Sensitive     CIPROFLOXACIN <=0.25 SENSITIVE Sensitive     GENTAMICIN >=16 RESISTANT Resistant     IMIPENEM <=0.25 SENSITIVE Sensitive     NITROFURANTOIN <=16 SENSITIVE Sensitive     TRIMETH/SULFA >=320 RESISTANT Resistant     AMPICILLIN/SULBACTAM >=32 RESISTANT  Resistant     PIP/TAZO <=4 SENSITIVE Sensitive     * 70,000 COLONIES/mL ESCHERICHIA COLI  SARS CORONAVIRUS 2 (TAT 6-24 HRS) Nasopharyngeal Nasopharyngeal Swab     Status: None   Collection Time: 01/25/20 11:25 AM   Specimen: Nasopharyngeal Swab  Result Value Ref Range Status   SARS Coronavirus 2 NEGATIVE NEGATIVE Final    Comment: (NOTE) SARS-CoV-2 target nucleic acids are NOT DETECTED. The SARS-CoV-2 RNA is generally detectable in upper and lower respiratory specimens during the acute phase of infection. Negative results do not preclude SARS-CoV-2 infection, do not rule out co-infections with other pathogens, and should not be used as the sole  basis for treatment or other patient management decisions. Negative results must be combined with clinical observations, patient history, and epidemiological information. The expected result is Negative. Fact Sheet for Patients: SugarRoll.be Fact Sheet for Healthcare Providers: https://www.woods-mathews.com/ This test is not yet approved or cleared by the [place] FDA and  has been authorized for detection and/or diagnosis of SARS-CoV-2 by FDA under an Emergency Use Authorization (EUA). This EUA will remain  in effect (meaning this test can be used) for the duration of the COVID-19 declaration under Section 56 4(b)(1) of the Act, 21 U.S.C. section 360bbb-3(b)(1), unless the authorization is terminated or revoked sooner. Performed at [facility], [place] [address], [place], [place] [address]   MRSA PCR Screening     Status: None   Collection Time: 01/26/20  9:32 AM   Specimen: Nasal Mucosa; Nasopharyngeal  Result Value Ref Range Status   MRSA by PCR NEGATIVE NEGATIVE Final    Comment:        The GeneXpert MRSA Assay (FDA approved for NASAL specimens only), is one component of a comprehensive MRSA colonization surveillance program. It is not intended to diagnose MRSA infection nor to  guide or monitor treatment for MRSA infections. Performed at [facility], [place] [address], [place], [place] [address]       Radiology Studies: DG Abd 1 View  Result Date: 01/26/2020 CLINICAL DATA:  64 year old male with possible pneumoperitoneum on supine abdominal radiograph EXAM: ABDOMEN - 1 VIEW COMPARISON:  Film earlier today FINDINGS: Upright abdominal radiograph demonstrates a large amount of pneumoperitoneum underlying the diaphragm. Distended small bowel loops are again noted. IMPRESSION: 1. Large amount of pneumoperitoneum underlying the diaphragm compatible with bowel perforation. 2. Distended small bowel loops again noted. Critical Value/emergent results were called by telephone at the time of interpretation on 01/26/2020 at 9:23 am to provider Dr. Barry Dienes, who verbally acknowledged these results. Electronically Signed   By: Margarette Canada M.D.   On: 01/26/2020 09:24   DG Abd 1 View  Result Date: 01/26/2020 CLINICAL DATA:  Small-bowel obstruction. EXAM: ABDOMEN - 1 VIEW COMPARISON:  01/25/2020 CT, 01/24/2020 radiograph and prior studies FINDINGS: Distended small bowel loops within the LEFT abdomen are noted. Both sides of the distended small bowel walls are well-defined and can be a sign of pneumoperitoneum. Gas and stool in the RIGHT colon is again identified. No other significant changes identified. IMPRESSION: Distended small bowel loops with both sides of the walls well-defined - pneumoperitoneum is not excluded. Consider upright/decubitus views or CT for further evaluation. Critical Value/emergent results were called by telephone at the time of interpretation on 01/26/2020 at 7:42 am to provider Charleston Ropes, RN, who verbally acknowledged these results. Electronically Signed   By: Margarette Canada M.D.   On: 01/26/2020 07:43      Scheduled Meds: . Chlorhexidine Gluconate Cloth  6 each Topical Daily  . enoxaparin (LOVENOX) injection  40 mg Subcutaneous Q24H  . insulin aspart  0-15 Units  Subcutaneous TID WC  . morphine   Intravenous Q4H   Continuous Infusions: . 0.9 % NaCl with KCl 20 mEq / L    . acetaminophen    . methocarbamol (ROBAXIN) IV    . piperacillin-tazobactam (ZOSYN)  IV 3.375 g (01/27/20 0602)     LOS: 2 days    Time spent: 30 minutes   Shelda Pal, DO [facility] 01/27/2020, 12:43 PM   Available via Epic secure chat 7am-7pm After these hours, please refer to coverage provider  listed on [email or web address]

## 2020-01-28 DIAGNOSIS — Z515 Encounter for palliative care: Secondary | ICD-10-CM

## 2020-01-28 DIAGNOSIS — Z7189 Other specified counseling: Secondary | ICD-10-CM

## 2020-01-28 LAB — CBC WITH DIFFERENTIAL/PLATELET
Abs Immature Granulocytes: 0 10*3/uL (ref 0.00–0.07)
Basophils Absolute: 0 10*3/uL (ref 0.0–0.1)
Basophils Relative: 0 %
Eosinophils Absolute: 0 10*3/uL (ref 0.0–0.5)
Eosinophils Relative: 0 %
HCT: 26.2 % — ABNORMAL LOW (ref 39.0–52.0)
Hemoglobin: 8.5 g/dL — ABNORMAL LOW (ref 13.0–17.0)
Immature Granulocytes: 0 %
Lymphocytes Relative: 8 %
Lymphs Abs: 0.3 10*3/uL — ABNORMAL LOW (ref 0.7–4.0)
MCH: 30.2 pg (ref 26.0–34.0)
MCHC: 32.4 g/dL (ref 30.0–36.0)
MCV: 93.2 fL (ref 80.0–100.0)
Monocytes Absolute: 0.2 10*3/uL (ref 0.1–1.0)
Monocytes Relative: 7 %
Neutro Abs: 2.5 10*3/uL (ref 1.7–7.7)
Neutrophils Relative %: 85 %
Platelets: 253 10*3/uL (ref 150–400)
RBC: 2.81 MIL/uL — ABNORMAL LOW (ref 4.22–5.81)
RDW: 13.7 % (ref 11.5–15.5)
WBC: 3 10*3/uL — ABNORMAL LOW (ref 4.0–10.5)
nRBC: 0 % (ref 0.0–0.2)

## 2020-01-28 LAB — BASIC METABOLIC PANEL
Anion gap: 7 (ref 5–15)
BUN: 16 mg/dL (ref 8–23)
CO2: 29 mmol/L (ref 22–32)
Calcium: 8.8 mg/dL — ABNORMAL LOW (ref 8.9–10.3)
Chloride: 100 mmol/L (ref 98–111)
Creatinine, Ser: 0.81 mg/dL (ref 0.61–1.24)
GFR calc Af Amer: >60 mL/min (ref >60)
GFR calc non Af Amer: >60 mL/min (ref >60)
Glucose, Bld: 104 mg/dL — ABNORMAL HIGH (ref 70–99)
Potassium: 4.1 mmol/L (ref 3.5–5.1)
Sodium: 136 mmol/L (ref 135–145)

## 2020-01-28 LAB — GLUCOSE, CAPILLARY
Glucose-Capillary: 90 mg/dL (ref 70–99)
Glucose-Capillary: 116 mg/dL — ABNORMAL HIGH (ref 70–99)
Glucose-Capillary: 76 mg/dL (ref 70–99)
Glucose-Capillary: 97 mg/dL (ref 70–99)
Glucose-Capillary: 90 mg/dL (ref 70–99)

## 2020-01-28 MED ORDER — METHOCARBAMOL 500 MG PO TABS
500.0000 mg | ORAL_TABLET | Freq: Three times a day (TID) | ORAL | Status: DC
  Administered 2020-01-28 – 2020-01-29 (×4): 500 mg via ORAL
  Filled 2020-01-28 (×4): qty 1

## 2020-01-28 MED ORDER — OXYCODONE HCL 5 MG PO TABS
5.0000 mg | ORAL_TABLET | ORAL | Status: DC | PRN
  Administered 2020-01-28 (×3): 10 mg via ORAL
  Filled 2020-01-28 (×3): qty 2

## 2020-01-28 MED ORDER — ACETAMINOPHEN 325 MG PO TABS
650.0000 mg | ORAL_TABLET | Freq: Four times a day (QID) | ORAL | Status: DC
  Administered 2020-01-28 – 2020-01-29 (×4): 650 mg via ORAL
  Filled 2020-01-28 (×5): qty 2

## 2020-01-28 MED ORDER — MORPHINE SULFATE (PF) 2 MG/ML IV SOLN
2.0000 mg | INTRAVENOUS | Status: DC | PRN
  Administered 2020-01-30 – 2020-01-31 (×2): 2 mg via INTRAVENOUS
  Administered 2020-02-01: 4 mg via INTRAVENOUS
  Administered 2020-02-01: 2 mg via INTRAVENOUS
  Administered 2020-02-01: 4 mg via INTRAVENOUS
  Administered 2020-02-01: 2 mg via INTRAVENOUS
  Administered 2020-02-02 (×2): 4 mg via INTRAVENOUS
  Administered 2020-02-07 (×2): 2 mg via INTRAVENOUS
  Filled 2020-01-28: qty 2
  Filled 2020-01-28: qty 1
  Filled 2020-01-28: qty 2
  Filled 2020-01-28: qty 1
  Filled 2020-01-28 (×3): qty 2
  Filled 2020-01-28 (×4): qty 1

## 2020-01-28 MED ORDER — ENOXAPARIN SODIUM 40 MG/0.4ML ~~LOC~~ SOLN
40.0000 mg | SUBCUTANEOUS | Status: DC
  Administered 2020-01-28 – 2020-02-06 (×10): 40 mg via SUBCUTANEOUS
  Filled 2020-01-28 (×10): qty 0.4

## 2020-01-28 NOTE — Progress Notes (Signed)
The chaplain visited with the patient as a result of a consult. The patient spoke about not being heard by medical professionals and feeling as if the people who are suppose to care for him do not care or even hear him.  The patient does not trust everyone who is responsible for his health.  The chaplain prayed with the patient and offered supportive conversation. The chaplain will follow-up later.  Brion Aliment Chaplain Resident For questions concerning this note please contact me by pager [phone]

## 2020-01-28 NOTE — Progress Notes (Signed)
Physical Therapy Treatment Patient Details Name: Juan Richards MRN: [id number] DOB: [birth year] Today's Date: 01/28/2020    History of Present Illness 64yo male c/o increased bowel pain on 2/6, referred to the ED by GI team due to abdominal extension and loss of appetite, X-ray on 2/6 showed SBO. Developed signs of sepsis on 2/7 and X-ray then showed concern for bowel perforation. Taken to the OR on an emergent basis and received exploratory laparatomy, R hemicolectomy, end ileostomy, and mucous fistula on 2/7. PMH colon diverticulitis with perforation, prostate cancer with hx of radiation treatments, lumbar surgery, prostatectomy    PT Comments    Continuing work on functional mobility and activity tolerance;  Slow moving, but very motivated to get better; Tearful at times with the overwhelming nature of his diagnosis and medical status   Follow Up Recommendations  CIR;Supervision/Assistance - 24 hour(May progress beyond needing CIR soon)     Equipment Recommendations  Rolling walker with 5" wheels;3in1 (PT)    Recommendations for Other Services       Precautions / Restrictions Precautions Precautions: Fall;Other (comment) Precaution Comments: abdominal incision, colostomy/ileostomy bags, multiple IVs    Mobility  Bed Mobility Overal bed mobility: Needs Assistance Bed Mobility: Rolling;Sidelying to Sit Rolling: Min assist Sidelying to sit: Min assist;HOB elevated       General bed mobility comments: MinA to roll completely over to the side, then MinA and use of rails to boost trunk to upright at EOB, HOB mildly elevated  Transfers Overall transfer level: Needs assistance Equipment used: Rolling walker (2 wheeled) Transfers: Sit to/from Stand Sit to Stand: Min assist         General transfer comment: Cues for hand placement; lots of encouragement  Ambulation/Gait Ambulation/Gait assistance: Min guard;Min assist Gait Distance (Feet): 60 Feet Assistive device:  Rolling walker (2 wheeled) Gait Pattern/deviations: Step-through pattern;Decreased step length - right;Decreased step length - left;Antalgic Gait velocity: decreased   General Gait Details: slow but steady with RW, required Min guard-MinA for balance with turns; cues for posture; RW height adjusted for optimal fit   Stairs             Wheelchair Mobility    Modified Rankin (Stroke Patients Only)       Balance     Sitting balance-Leahy Scale: Good       Standing balance-Leahy Scale: Poor Standing balance comment: heavy reliance on BUE support                            Cognition Arousal/Alertness: Awake/alert Behavior During Therapy: WFL for tasks assessed/performed;Anxious Overall Cognitive Status: Within Functional Limits for tasks assessed                                 General Comments: slightly anxious about mobility due to worries over pain, otherwise very pleasant and motivated, will do whatever is asked of him      Exercises      General Comments        Pertinent Vitals/Pain Pain Assessment: Faces Faces Pain Scale: Hurts worst Pain Location: generalized discomfort, and abdominal discomfort Pain Descriptors / Indicators: Aching;Discomfort;Crying Pain Intervention(s): Monitored during session;Patient requesting pain meds-RN notified    Home Living                      Prior Function  PT Goals (current goals can now be found in the care plan section) Acute Rehab PT Goals Patient Stated Goal: To walk PT Goal Formulation: With patient Time For Goal Achievement: 01/27/20 Potential to Achieve Goals: Good Progress towards PT goals: Progressing toward goals    Frequency    Min 3X/week      PT Plan Current plan remains appropriate    Co-evaluation              AM-PAC PT "6 Clicks" Mobility   Outcome Measure  Help needed turning from your back to your side while in a flat bed without  using bedrails?: A Little Help needed moving from lying on your back to sitting on the side of a flat bed without using bedrails?: A Little Help needed moving to and from a bed to a chair (including a wheelchair)?: A Little Help needed standing up from a chair using your arms (e.g., wheelchair or bedside chair)?: A Lot Help needed to walk in hospital room?: A Little Help needed climbing 3-5 steps with a railing? : A Lot 6 Click Score: 16    End of Session   Activity Tolerance: Patient tolerated treatment well Patient left: in chair;with call bell/phone within reach Nurse Communication: Mobility status PT Visit Diagnosis: Difficulty in walking, not elsewhere classified (R26.2);Muscle weakness (generalized) (M62.81)     Time: [id number] PT Time Calculation (min) (ACUTE ONLY): 28 min  Charges:  $Gait Training: 23-37 mins                     Roney Marion, [place]  Acute Rehabilitation Services Pager [phone] Office [phone]    Colletta [place] 01/28/2020, 1:17 PM

## 2020-01-28 NOTE — Progress Notes (Signed)
[facility] Progress Note  2 Days Post-Op  Subjective: Patient reports pain control was adequate overnight and he was able to get some sleep. Discussed transition from PCA to PO pain control today. NGT removed last night and patient denies nausea or vomiting. Patient sat up in the chair for several hours yesterday and hoping to ambulate in the halls today.    Objective: Vital signs in last 24 hours: Temp:  [97.8 F (36.6 C)-98.3 F (36.8 C)] 97.8 F (36.6 C) (02/09 0509) Pulse Rate:  [71-111] 71 (02/09 0509) Resp:  [14-19] 16 (02/09 0509) BP: (116-142)/(82-102) 142/82 (02/09 0509) SpO2:  [92 %-100 %] 100 % (02/09 0509) Last BM Date: 01/24/20  Intake/Output from previous day: 02/08 0701 - 02/09 0700 In: 2078.5 [P.O.:160; I.V.:1425; IV Piggyback:493.5] Out: 1700 [Urine:1300; Emesis/NG output:400] Intake/Output this shift: No intake/output data recorded.  PE: Gen:  Alert, NAD, pleasant Card:  Regular rate and rhythm Pulm:  Normal effort, clear to auscultation bilaterally Abd: Soft, appropriately tender, mildly distended, +BS hypoactive, midline wound clean, ileostomy viable with watery stool in bag, mucus fistula with small amount mucus drainage and some thick stool on stoma   Lab Results:  Recent Labs    01/27/20 0242 01/28/20 0154  WBC 4.6 3.0*  HGB 10.7* 8.5*  HCT 32.6* 26.2*  PLT 286 253   BMET Recent Labs    01/27/20 0242 01/28/20 0154  NA 133* 136  K 3.9 4.1  CL 95* 100  CO2 27 29  GLUCOSE 121* 104*  BUN 21 16  CREATININE 0.99 0.81  CALCIUM 8.9 8.8*   PT/INR No results for input(s): LABPROT, INR in the last 72 hours. CMP     Component Value Date/Time   NA 136 01/28/2020 0154   K 4.1 01/28/2020 0154   CL 100 01/28/2020 0154   CO2 29 01/28/2020 0154   GLUCOSE 104 (H) 01/28/2020 0154   BUN 16 01/28/2020 0154   CREATININE 0.81 01/28/2020 0154   CALCIUM 8.8 (L) 01/28/2020 0154   PROT 7.7 01/25/2020 0830   ALBUMIN 3.9 01/25/2020 0830    AST 39 01/25/2020 0830   ALT 45 (H) 01/25/2020 0830   ALKPHOS 64 01/25/2020 0830   BILITOT 1.4 (H) 01/25/2020 0830   GFRNONAA >60 01/28/2020 0154   GFRAA >60 01/28/2020 0154   Lipase  No results found for: LIPASE     Studies/Results: No results found.  Anti-infectives: Anti-infectives (From admission, onward)   Start     Dose/Rate Route Frequency Ordered Stop   01/26/20 1600  piperacillin-tazobactam (ZOSYN) IVPB 3.375 g     3.375 g 12.5 mL/hr over 240 Minutes Intravenous Every 8 hours 01/26/20 1532     01/25/20 1330  piperacillin-tazobactam (ZOSYN) IVPB 3.375 g  Status:  Discontinued     3.375 g 12.5 mL/hr over 240 Minutes Intravenous Every 6 hours 01/25/20 1320 01/26/20 1532   01/25/20 1130  piperacillin-tazobactam (ZOSYN) IVPB 3.375 g     3.375 g 100 mL/hr over 30 Minutes Intravenous  Once 01/25/20 1118 01/25/20 1225       Assessment/Plan Hx of prostate cancer s/p XRT  LBO with cecal perforation Carcinomatosis S/p exploratory laparotomy, right hemicolectomy, end ileostomy, mucus fistula of transverse colon - 01/26/20 Dr. Barry Dienes - POD#2 - surgical path pending - NGT out and patient having output from ileostomy - start FLD - Continue therapies, recommending CIR - continue BID dressing changes - start transition to PO pain control - d/c foley ABL anemia - expected post-op, hgb  8.5, VSS, follow CBC   FEN: FLD, SLIV VTE: SCDs, lovenox ID: IV zosyn 2/6>> Foley: d/c today  Follow up: Dr. Barry Dienes  LOS: 3 days    Brigid Re , [facility] 01/28/2020, 9:24 AM Please see Amion for pager number during day hours 7:00am-4:30pm

## 2020-01-28 NOTE — Progress Notes (Signed)
PROGRESS NOTE    Juan Richards  ZOX:[id number] DOB: 08-08-1956 DOA: 01/25/2020 PCP: [name], MD     Brief Narrative:  Patient is a 64 year old male with a past medical history of prostate cancer status post radiation and prostatectomy who presented with increasing abdominal pain on 01/25/2020.  His GI team sent him in due to abdominal distention and loss of appetite.  Abdominal x-ray showed findings consistent with small bowel obstruction.  Patient met criteria for sepsis on 2/7 with persistent fever, tachycardia and drop of white blood cell count to 1.3.  Repeat abdominal x-ray showed air underneath the diaphragm concerning for perforation he was taken to the operating room on an emergent basis.  He was found to have a perforated cecum in addition to small nodules over the segments of the mesentery and bowel concerning for malignancy.  The sigmoid colon was adhered to the bladder and wall of the pelvis making unclear if there was a mass.    New events last 24 hours / Subjective: IV pain medications withdrawn, currently controlled with orals.  No nausea or vomiting, trying to eat a little something this morning.  Plan is to discharge him to inpatient rehab following hospitalization.  Assessment & Plan:   Principal Problem:   Bowel perforation (HCC)             S/p surgery on 2/7             Appreciate Gen surg recs             Ofirmev and Robaxin per primary team             Toradol 30 mg every 6 hours as needed             Morphine 2-4 mg q 2 hrs prn  Oxycodone 5-10 mg q 4 hrs prn             Await surgical path  Zosyn 3.37 g every 8 hours  Active Problems:   Anemia  Monitor CBC    Hyponatremia             Monitor BMP    UTI (urinary tract infection)             Zosyn    Pre-diabetes             Sliding scale insulin    Diverticulitis of large intestine with perforation   SBO (small bowel obstruction) (HCC)             NG tube no longer in, starting to eat            Appreciate general surgery    Sepsis ([place])             Resolved             Monitor CBC    Tachycardia             Resolved    Neutropenia with fever (HCC)             Resolved    Colovesical fistula  DVT prophylaxis: Lovenox Code Status: Full Family Communication: Self Coming From: Home Disposition Plan: Inpatient rehab Barriers to Discharge: Rehab availability, clinical improvement  Consultants:   General surgery  Procedures:   Exploratory laparotomy, right hemicolectomy, end ileostomy, mucus fistula of transverse colon on 01/26/20  Antimicrobials:  Anti-infectives (From admission, onward)   Start     Dose/Rate Route Frequency Ordered Stop   01/26/20  1600  piperacillin-tazobactam (ZOSYN) IVPB 3.375 g     3.375 g 12.5 mL/hr over 240 Minutes Intravenous Every 8 hours 01/26/20 1532     01/25/20 1330  piperacillin-tazobactam (ZOSYN) IVPB 3.375 g  Status:  Discontinued     3.375 g 12.5 mL/hr over 240 Minutes Intravenous Every 6 hours 01/25/20 1320 01/26/20 1532   01/25/20 1130  piperacillin-tazobactam (ZOSYN) IVPB 3.375 g     3.375 g 100 mL/hr over 30 Minutes Intravenous  Once 01/25/20 1118 01/25/20 1225        Objective: Vitals:   01/28/20 0010 01/28/20 0400 01/28/20 0409 01/28/20 0509  BP:    (!) 142/82  Pulse:    71  Resp: '14 15 14 16  '$ Temp:    97.8 F (36.6 C)  TempSrc:    Oral  SpO2: 100% 100% 100% 100%  Weight:      Height:        Intake/Output Summary (Last 24 hours) at 01/28/2020 1227 Last data filed at 01/28/2020 0953 Gross per 24 hour  Intake 2498.47 ml  Output 1800 ml  Net 698.47 ml   Filed Weights   01/25/20 1327  Weight: 87.2 kg    Examination:  General exam: Appears calm and comfortable  Respiratory system: Clear to auscultation. Respiratory effort normal. No respiratory distress. No conversational dyspnea.  Cardiovascular system: S1 & S2 heard, RRR. No murmurs. No pedal edema. Gastrointestinal system: Abdomen is dressed  in middle, ostomy bags in place b/l Central nervous system: Alert and oriented. No focal neurological deficits. Speech clear.  Extremities: Symmetric in appearance  Skin: No rashes, lesions or ulcers on exposed skin  Psychiatry: Judgement and insight appear normal. Mood & affect appropriate.   Data Reviewed: I have personally reviewed following labs and imaging studies  CBC: Recent Labs  Lab 01/24/20 1429 01/25/20 0830 01/26/20 0309 01/27/20 0242 01/28/20 0154  WBC 4.7 6.8 1.3* 4.6 3.0*  NEUTROABS 2.9 5.9  --  4.1 2.5  HGB 11.9* 12.7* 11.3* 10.7* 8.5*  HCT 34.8* 37.9* 34.3* 32.6* 26.2*  MCV 89.5 90.2 90.5 91.8 93.2  PLT 363.0 417* 373 286 161   Basic Metabolic Panel: Recent Labs  Lab 01/24/20 1429 01/25/20 0830 01/26/20 0309 01/27/20 0242 01/28/20 0154  NA 134* 133* 136 133* 136  K 3.6 4.0 3.9 3.9 4.1  CL 97 94* 97* 95* 100  CO2 '28 24 27 27 29  '$ GLUCOSE 101* 155* 135* 121* 104*  BUN 17 19 25* 21 16  CREATININE 0.75 0.93 1.14 0.99 0.81  CALCIUM 10.1 10.3 9.3 8.9 8.8*   GFR: Estimated Creatinine Clearance: 100.7 mL/min (by C-G formula based on SCr of 0.81 mg/dL). Liver Function Tests: Recent Labs  Lab 01/24/20 1429 01/25/20 0830  AST 15 39  ALT 16 45*  ALKPHOS 56 64  BILITOT 0.7 1.4*  PROT 8.3 7.7  ALBUMIN 4.3 3.9   CBG: Recent Labs  Lab 01/27/20 1244 01/27/20 1653 01/27/20 2124 01/28/20 0728 01/28/20 1152  GLUCAP 104* 117* 94 90 116*   Recent Results (from the past 240 hour(s))  Urine culture     Status: Abnormal   Collection Time: 01/25/20 11:19 AM   Specimen: Urine, Random  Result Value Ref Range Status   Specimen Description URINE, RANDOM  Final   Special Requests   Final    NONE Performed at [facility], [place] [address] [place], [place] [address]    Culture 70,000 COLONIES/mL ESCHERICHIA COLI (A)  Final   Report Status  01/27/2020 FINAL  Final   Organism ID, Bacteria ESCHERICHIA COLI (A)  Final      Susceptibility   Escherichia  coli - MIC*    AMPICILLIN >=32 RESISTANT Resistant     CEFAZOLIN <=4 SENSITIVE Sensitive     CEFTRIAXONE <=0.25 SENSITIVE Sensitive     CIPROFLOXACIN <=0.25 SENSITIVE Sensitive     GENTAMICIN >=16 RESISTANT Resistant     IMIPENEM <=0.25 SENSITIVE Sensitive     NITROFURANTOIN <=16 SENSITIVE Sensitive     TRIMETH/SULFA >=320 RESISTANT Resistant     AMPICILLIN/SULBACTAM >=32 RESISTANT Resistant     PIP/TAZO <=4 SENSITIVE Sensitive     * 70,000 COLONIES/mL ESCHERICHIA COLI  SARS CORONAVIRUS 2 (TAT 6-24 HRS) Nasopharyngeal Nasopharyngeal Swab     Status: None   Collection Time: 01/25/20 11:25 AM   Specimen: Nasopharyngeal Swab  Result Value Ref Range Status   SARS Coronavirus 2 NEGATIVE NEGATIVE Final    Comment: (NOTE) SARS-CoV-2 target nucleic acids are NOT DETECTED. The SARS-CoV-2 RNA is generally detectable in upper and lower respiratory specimens during the acute phase of infection. Negative results do not preclude SARS-CoV-2 infection, do not rule out co-infections with other pathogens, and should not be used as the sole basis for treatment or other patient management decisions. Negative results must be combined with clinical observations, patient history, and epidemiological information. The expected result is Negative. Fact Sheet for Patients: SugarRoll.be Fact Sheet for Healthcare Providers: https://www.woods-mathews.com/ This test is not yet approved or cleared by the [place] FDA and  has been authorized for detection and/or diagnosis of SARS-CoV-2 by FDA under an Emergency Use Authorization (EUA). This EUA will remain  in effect (meaning this test can be used) for the duration of the COVID-19 declaration under Section 56 4(b)(1) of the Act, 21 U.S.C. section 360bbb-3(b)(1), unless the authorization is terminated or revoked sooner. Performed at [facility], [place] [address], [place], [place] [address]   MRSA PCR  Screening     Status: None   Collection Time: 01/26/20  9:32 AM   Specimen: Nasal Mucosa; Nasopharyngeal  Result Value Ref Range Status   MRSA by PCR NEGATIVE NEGATIVE Final    Comment:        The GeneXpert MRSA Assay (FDA approved for NASAL specimens only), is one component of a comprehensive MRSA colonization surveillance program. It is not intended to diagnose MRSA infection nor to guide or monitor treatment for MRSA infections. Performed at [facility], [place] [address], [place], [place] [address]   Aerobic/Anaerobic Culture (surgical/deep wound)     Status: None (Preliminary result)   Collection Time: 01/26/20  1:16 PM   Specimen: Other Source; Body Fluid  Result Value Ref Range Status   Specimen Description WOUND ABDOMEN FLUID  Final   Special Requests PATIENT ON FOLLOWING ZOSYN  Final   Gram Stain   Final    MODERATE WBC PRESENT,BOTH PMN AND MONONUCLEAR NO ORGANISMS SEEN Performed at [facility], [address] [place],  [address]    Culture RARE GRAM NEGATIVE RODS  Final   Report Status PENDING  Incomplete    Scheduled Meds: . acetaminophen  650 mg Oral Q6H  . Chlorhexidine Gluconate Cloth  6 each Topical Daily  . insulin aspart  0-15 Units Subcutaneous TID WC  . methocarbamol  500 mg Oral TID   Continuous Infusions: . 0.9 % NaCl with KCl 20 mEq / L 10 mL/hr at 01/28/20 0808  . piperacillin-tazobactam (ZOSYN)  IV  3.375 g (01/28/20 0502)     LOS: 3 days    Time spent: 20 minutes   Shelda Pal, DO [facility] 01/28/2020, 12:27 PM   Available via Epic secure chat 7am-7pm After these hours, please refer to coverage provider listed on [email or web address]

## 2020-01-28 NOTE — Consult Note (Signed)
Consultation Note Date: 01/28/2020   Patient Name: Juan Richards  DOB: 12/25/1955  MRN: [id number]  Age / Sex: 64 y.o., male  PCP: [name], MD Referring Physician: Shelda Pal,*  Reason for Consultation: Establishing goals of care  HPI/Patient Profile: 64 y.o. male  with past medical history of prostate cancer s/p radiation and prostatectomy, diverticulitis with colon perforation in 2010, and lumbar spine stenosis admitted on 01/25/2020 with abdominal pain, distention, and loss of appetite. Abd xray revealed sm bowel obstruction. On 2/7 patient developed fever and tachycardia. Repeat xray revealed perforation and he was emergently taken to OR. Found to have perforated cecum and small nodules over segments of bowel concerning for malignancy. PMT consulted for goals of care.   Clinical Assessment and Goals of Care: I have reviewed medical records including EPIC notes, labs and imaging, received report from RN and Dr. Nani Ravens, assessed the patient and then met with patient to discuss diagnosis prognosis, [place], EOL wishes, disposition and options.  I introduced Palliative Medicine as specialized medical care for people living with serious illness. It focuses on providing relief from the symptoms and stress of a serious illness. The goal is to improve quality of life for both the patient and the family.  We discussed a brief life review of the patient. [name] has been married to his wife "[name]" for 36 years. They have one daughter who is a Education officer, museum. [name] tells me about multiple supportive family members - he has 5 siblings. 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 He describes himself as analytical - tells me he can handle things much better as long as he knows the plan and what to expect. He describes himself as a spiritual person - supportive pastor. He is also a Air cabin crew.   As far  as functional and nutritional status, prior to current illness [name] was fully functional - still working and driving himself to his medical appointments. He was able to care for himself independently. He does endorse a poor appetite. Hardly any PO intake over the past 3 weeks. Has had trouble with diarrhea since starting radiation and has been limiting intake due to this.    We discussed his current illness and what it means in the larger context of his on-going co-morbidities.  Mr. Anspach has a good understanding of his current illness and asks appropriate questions. He has a great relationship with his urologist, [name], and heavily depends on his advice. He understands the concern about cancer in his abdomen.  [name] shares a lot about his frustrations with his disease and treatments. Therapeutic listening and emotional support provided.   I attempted to elicit values and goals of care important to the patient.  At this time, [name] is interested in all treatment options offered - chemo, radiation, surgery, etc.   We discussed [name] plans after discharge - he is very interested and agreeable to CIR.  [name] shares that if he became unable to make his own medical decisions he would want his wife to be his HCPOA - we discussed that she already is legally and so documents are not necessary. He shares that they have discussed what his wishes would be if he were nearing the end of life.   We discussed resuscitative measures - at this time, [name] is interested in resuscitative attempts. He does share that as he becomes more ill he would reconsider this decision - he  has discussed this with his wife as well. He tells me she will know when it is time to "let him go".  [name] is interested in chaplain services for ongoing emotional support.   Questions and concerns were addressed.  Hard Choices booklet left for review. The family was encouraged to call with questions  or concerns.   Primary Decision Maker PATIENT    SUMMARY OF RECOMMENDATIONS   - full code/full scope - patient interested in CIR - chaplain consulted for ongoing emotional support/prayer - ongoing palliative support through cancer center  Code Status/Advance Care Planning:  Full code   Symptom Management:   Pain well controlled w/ current regimen, denies other symptoms at this time  Psycho-social/Spiritual:   Desire for further Chaplaincy support:yes  Prognosis:   Unable to determine  Discharge Planning: CIR      Primary Diagnoses: Present on Admission: . Pre-diabetes . Prostate cancer ([place]) . Diverticulitis of large intestine with perforation . SBO (small bowel obstruction) ([place]) . Sepsis ([place]) . Tachycardia . Neutropenia with fever ([place]) . UTI (urinary tract infection) . Colovesical fistula   I have reviewed the medical record, interviewed the patient and family, and examined the patient. The following aspects are pertinent.  Past Medical History:  Diagnosis Date  . DIVERTICULITIS, COLON, WITH PERFORATION 07/23/2009  . Numbness and tingling    RIGHT TOES AND LEFT LEG DUE TO L 5 DISC   . PLEURISY YRS AGO  . Prostate cancer ([place])   . 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 Marital status: Married    Spouse name: Not on file  . Number of children: 1  . Years of education: 60  . Highest education level: Not on file  Occupational History  . Occupation: Production assistant, radio  Tobacco Use  . Smoking status: Never Smoker  . Smokeless tobacco: Never Used  Substance and Sexual Activity  . Alcohol use: Yes    Alcohol/week: 2.0 - 3.0 standard drinks    Types: 2 - 3 Cans of beer per week    Comment: OCC  . Drug use: No  . Sexual activity: Yes  Other Topics Concern  . Not on file  Social History Narrative   Fun: Watch TV   Social Determinants of Health   Financial Resource Strain:   . Difficulty of Paying  Living Expenses: Not on file  Food Insecurity:   . Worried About Charity fundraiser in the Last Year: Not on file  . Ran Out of Food in the Last Year: Not on file  Transportation Needs:   . Lack of Transportation (Medical): Not on file  . Lack of Transportation (Non-Medical): Not on file  Physical Activity:   . Days of Exercise per Week: Not on file  . Minutes of Exercise per Session: Not on file  Stress:   . Feeling of Stress : Not on file  Social Connections:   . Frequency of Communication with Friends and Family: Not on file  . Frequency of Social Gatherings with Friends and Family: Not on file  . Attends Religious Services: Not on file  . Active Member of Clubs or Organizations: Not on file  . Attends Archivist Meetings: Not on file  . Marital Status: Not on file   Family History  Problem Relation Age of Onset  . Diabetes Mother   . Kidney disease Mother   . Cancer Father        ?  Marland Kitchen  Diabetes Maternal Grandmother   . Colon cancer Brother   . Esophageal cancer Neg Hx   . Rectal cancer Neg Hx   . Stomach cancer Neg Hx    Scheduled Meds: . acetaminophen  650 mg Oral Q6H  . Chlorhexidine Gluconate Cloth  6 each Topical Daily  . insulin aspart  0-15 Units Subcutaneous TID WC  . methocarbamol  500 mg Oral TID   Continuous Infusions: . 0.9 % NaCl with KCl 20 mEq / L 10 mL/hr at 01/28/20 0808  . piperacillin-tazobactam (ZOSYN)  IV 3.375 g (01/28/20 0502)   PRN Meds:.ketorolac, menthol-cetylpyridinium, morphine injection, ondansetron (ZOFRAN) IV, oxyCODONE, phenol No Known Allergies Review of Systems  Constitutional: Positive for activity change, appetite change and fatigue.  Respiratory: Negative.   Cardiovascular: Negative.   Gastrointestinal: Positive for abdominal pain. Negative for nausea and vomiting.  Neurological: Positive for weakness.  Psychiatric/Behavioral: The patient is nervous/anxious.     Physical Exam Constitutional:      General: He is  not in acute distress. HENT:     Head: Normocephalic and atraumatic.  Cardiovascular:     Rate and Rhythm: Normal rate and regular rhythm.  Pulmonary:     Effort: Pulmonary effort is normal.     Breath sounds: Normal breath sounds.  Musculoskeletal:     Right lower leg: No edema.     Left lower leg: No edema.  Skin:    General: Skin is warm and dry.  Neurological:     Mental Status: He is alert and oriented to person, place, and time.     Vital Signs: BP (!) 142/82 (BP Location: Right Arm)   Pulse 71   Temp 97.8 F (36.6 C) (Oral)   Resp 16   Ht 5' 9" (1.753 m)   Wt 87.2 kg   SpO2 100%   BMI 28.39 kg/m  Pain Scale: 0-10 POSS *See Group Information*: S-Acceptable,Sleep, easy to arouse Pain Score: 0-No pain   SpO2: SpO2: 100 % O2 Device:SpO2: 100 % O2 Flow Rate: .O2 Flow Rate (L/min): 2 L/min  IO: Intake/output summary:   Intake/Output Summary (Last 24 hours) at 01/28/2020 0949 Last data filed at 01/28/2020 1324 Gross per 24 hour  Intake 2138.47 ml  Output 1300 ml  Net 838.47 ml    LBM: Last BM Date: 01/24/20 Baseline Weight: Weight: 87.2 kg Most recent weight: Weight: 87.2 kg     Palliative Assessment/Data: PPS 60%    Time Total: 70 minutes Greater than 50%  of this time was spent counseling and coordinating care related to the above assessment and plan.  Juel Burrow, DNP, AGNP-C Palliative Medicine Team [phone] Pager: [phone]

## 2020-01-28 NOTE — Evaluation (Signed)
Occupational Therapy Evaluation Patient Details Name: Juan Richards MRN: [id number] DOB: 08/06/1956 Today's Date: 01/28/2020    History of Present Illness 64yo male c/o increased bowel pain on 2/6, referred to the ED by GI team due to abdominal extension and loss of appetite, X-ray on 2/6 showed SBO. Developed signs of sepsis on 2/7 and X-ray then showed concern for bowel perforation. Taken to the OR on an emergent basis and received exploratory laparatomy, R hemicolectomy, end ileostomy, and mucous fistula on 2/7. PMH colon diverticulitis with perforation, prostate cancer with hx of radiation treatments, lumbar surgery, prostatectomy   Clinical Impression   Patient presenting with decreased I in self care, balance, functional mobility/transfers, endurance, strength, and safety awareness. Patient reports being independent PTA. Patient currently functioning min A with ambulation and min - mod for functional transfers depending on fatigue and pain.min - max A for self care tasks. Patient will benefit from acute OT to increase overall independence in the areas of ADLs, functional mobility, and safety awareness in order to safely discharge to next venue of care.    Follow Up Recommendations  CIR;Supervision/Assistance - 24 hour    Equipment Recommendations  Other (comment)(defer to next venue of care)       Precautions / Restrictions Precautions Precautions: Fall;Other (comment) Precaution Comments: abdominal incision, colostomy/ileostomy bags, multiple IVs      Mobility Bed Mobility Overal bed mobility: Needs Assistance Bed Mobility: Rolling;Sidelying to Sit Rolling: Min assist Sidelying to sit: Min assist;HOB elevated       General bed mobility comments: HOB elevated, min A for trunk after pt rolling onto R side himself  Transfers Overall transfer level: Needs assistance Equipment used: Rolling walker (2 wheeled) Transfers: Sit to/from Stand Sit to Stand: Mod assist          General transfer comment: mod lifting assistance to come into standing    Balance Overall balance assessment: Needs assistance Sitting-balance support: Bilateral upper extremity supported;Feet supported Sitting balance-Leahy Scale: Good Sitting balance - Comments: S for safety   Standing balance support: Bilateral upper extremity supported;During functional activity Standing balance-Leahy Scale: Poor Standing balance comment: heavy reliance on BUE support          ADL either performed or assessed with clinical judgement   ADL Overall ADL's : Needs assistance/impaired Eating/Feeding: Supervision/ safety   Grooming: Wash/dry hands;Wash/dry face;Oral care;Standing;Minimal assistance Grooming Details (indicate cue type and reason): for balance Upper Body Bathing: Minimal assistance;Sitting   Lower Body Bathing: Maximal assistance;Sit to/from stand   Upper Body Dressing : Minimal assistance;Sitting   Lower Body Dressing: Maximal assistance;Sit to/from stand   Toilet Transfer: Moderate assistance Toilet Transfer Details (indicate cue type and reason): mod lifting assistance           General ADL Comments: pt needing increased assistance with self care secondary to increased pain with mobility     Vision Baseline Vision/History: No visual deficits              Pertinent Vitals/Pain Pain Assessment: Faces Faces Pain Scale: Hurts whole lot Pain Location: generalized discomfort, and abdominal discomfort Pain Descriptors / Indicators: Aching;Discomfort;Moaning;Guarding Pain Intervention(s): Limited activity within patient's tolerance;Monitored during session;Premedicated before session;Repositioned     Hand Dominance Right   Extremity/Trunk Assessment Upper Extremity Assessment Upper Extremity Assessment: Generalized weakness   Lower Extremity Assessment Lower Extremity Assessment: Generalized weakness   Cervical / Trunk Assessment Cervical / Trunk Assessment:  Normal   Communication Communication Communication: No difficulties   Cognition Arousal/Alertness: Awake/alert Behavior  During Therapy: WFL for tasks assessed/performed;Anxious Overall Cognitive Status: Within Functional Limits for tasks assessed        General Comments: pt anxious with mobility and wanting medication before moving but very pleasant              Home Living Family/patient expects to be discharged to:: Private residence Living Arrangements: Spouse/significant other Available Help at Discharge: Family;Available 24 hours/day Type of Home: House Home Access: Stairs to enter [employer] of Steps: 1 STE in the back no rails Entrance Stairs-Rails: None Home Layout: Multi-level Alternate Level Stairs-Number of Steps: flight   Bathroom Shower/Tub: Tub/shower unit;Door   [employer] Toilet: Standard     Home Equipment: None   Additional Comments: very independent before, this surgery and resultant deconditioning is new to him      Prior Functioning/Environment Level of Independence: Independent                 OT Problem List: Decreased strength;Pain;Decreased activity tolerance;Decreased safety awareness;Impaired balance (sitting and/or standing);Decreased knowledge of use of DME or AE      OT Treatment/Interventions: Self-care/ADL training;Therapeutic exercise;Therapeutic activities;Energy conservation;DME and/or AE instruction;Patient/family education;Balance training    OT Goals(Current goals can be found in the care plan section) Acute Rehab OT Goals Patient Stated Goal: to go to rehab and get stronger OT Goal Formulation: With patient Time For Goal Achievement: 02/11/20 Potential to Achieve Goals: Good ADL Goals Pt Will Perform Upper Body Bathing: with supervision Pt Will Perform Lower Body Bathing: with supervision Pt Will Perform Upper Body Dressing: with supervision Pt Will Perform Lower Body Dressing: with supervision Pt Will  Transfer to Toilet: with supervision Pt Will Perform Toileting - Clothing Manipulation and hygiene: with supervision Pt Will Perform Tub/Shower Transfer: with supervision  OT Frequency: Min 2X/week   Barriers to D/C: Other (comment)  none known at this time          AM-PAC OT "6 Clicks" Daily Activity     Outcome Measure Help from another person eating meals?: None Help from another person taking care of personal grooming?: A Little Help from another person toileting, which includes using toliet, bedpan, or urinal?: A Lot Help from another person bathing (including washing, rinsing, drying)?: A Lot Help from another person to put on and taking off regular upper body clothing?: A Little Help from another person to put on and taking off regular lower body clothing?: A Lot 6 Click Score: 16   End of Session Equipment Utilized During Treatment: Rolling walker;Gait belt Nurse Communication: Mobility status;Patient requests pain meds  Activity Tolerance: Patient tolerated treatment well Patient left: in chair;with call bell/phone within reach;with chair alarm set  OT Visit Diagnosis: Unsteadiness on feet (R26.81);Muscle weakness (generalized) (M62.81);Pain Pain - part of body: (abdomen)                Time: [id number] OT Time Calculation (min): 29 min Charges:  OT General Charges $OT Visit: 1 Visit OT Evaluation $OT Eval Low Complexity: 1 Low OT Treatments $Therapeutic Activity: 8-22 mins   Gypsy Decant MS, OTR/L 01/28/2020, 3:05 PM

## 2020-01-28 NOTE — Consult Note (Addendum)
[facility] Nurse ostomy follow up Stoma type/location:  Ileostomy RLQ; MF LLQ   Stomal assessment/size:  Ileostomy: 1 3/8" round, budded, pink, moist, superficial mucosal sloughing from 10-1 o'clock MF oval shaped area with small pink opening  1 3/8" x 3/4"  Peristomal assessment: intact   Treatment options for stomal/peristomal skin:  Ileostomy; using 2" barrier ring to protect skin from ileostomy effluent   Output: ileostomy liquid brown; MF scant liquid brown  Ostomy pouching: 2pc. Placed to the ileostomy; 1pc flat to the MF   Education provided:  Explained role of ostomy nurse and creation of stoma  Explained stoma characteristics (budded, flush, color, texture, care) Demonstrated pouch change (cutting new skin barrier, measuring stoma, cleaning peristomal skin and stoma, use of barrier ring) Education on emptying when 1/3 to 1/2 full and how to empty Demonstrated use of wick to clean spout  Discussed bathing, diet, gas Answered patient/family questions:   Discussed anatomy and creation of ileostomy and MF multiple times while in the room with the patient.   Enrolled patient in [place] Start Discharge program: Yes  [facility] Nurse will follow along with you for continued support with ostomy teaching and care [place] MSN, [place], [place], [place], [place]

## 2020-01-28 NOTE — Progress Notes (Signed)
Inpatient Rehabilitation Admissions Coordinator  Inpatient rehab consult received. Patient currently in bathroom bathing self. I will follow up today for rehab assessment.  Danne Baxter, RN, MSN Rehab Admissions Coordinator [phone] 01/28/2020 10:16 AM

## 2020-01-28 NOTE — Progress Notes (Addendum)
Inpatient Rehabilitation Admissions Coordinator  Met with patient at bedside and he is in agreement to a possible CIR admit. We discussed goals and expectations of an inpt rehab admit. I will begin authorization with [employer].  Danne Baxter, RN, MSN Rehab Admissions Coordinator [phone] 01/28/2020 1:48 PM

## 2020-01-29 ENCOUNTER — Inpatient Hospital Stay (HOSPITAL_COMMUNITY): Payer: 59

## 2020-01-29 DIAGNOSIS — D62 Acute posthemorrhagic anemia: Secondary | ICD-10-CM | POA: Diagnosis not present

## 2020-01-29 DIAGNOSIS — E871 Hypo-osmolality and hyponatremia: Secondary | ICD-10-CM | POA: Diagnosis not present

## 2020-01-29 LAB — GLUCOSE, CAPILLARY
Glucose-Capillary: 93 mg/dL (ref 70–99)
Glucose-Capillary: 101 mg/dL — ABNORMAL HIGH (ref 70–99)
Glucose-Capillary: 100 mg/dL — ABNORMAL HIGH (ref 70–99)
Glucose-Capillary: 86 mg/dL (ref 70–99)

## 2020-01-29 LAB — BASIC METABOLIC PANEL
Anion gap: 8 (ref 5–15)
BUN: 10 mg/dL (ref 8–23)
CO2: 27 mmol/L (ref 22–32)
Calcium: 8.5 mg/dL — ABNORMAL LOW (ref 8.9–10.3)
Chloride: 98 mmol/L (ref 98–111)
Creatinine, Ser: 0.76 mg/dL (ref 0.61–1.24)
GFR calc Af Amer: >60 mL/min (ref >60)
GFR calc non Af Amer: >60 mL/min (ref >60)
Glucose, Bld: 101 mg/dL — ABNORMAL HIGH (ref 70–99)
Potassium: 3.7 mmol/L (ref 3.5–5.1)
Sodium: 133 mmol/L — ABNORMAL LOW (ref 135–145)

## 2020-01-29 LAB — CBC WITH DIFFERENTIAL/PLATELET
Abs Immature Granulocytes: 0.02 10*3/uL (ref 0.00–0.07)
Basophils Absolute: 0 10*3/uL (ref 0.0–0.1)
Basophils Relative: 0 %
Eosinophils Absolute: 0.1 10*3/uL (ref 0.0–0.5)
Eosinophils Relative: 3 %
HCT: 27.5 % — ABNORMAL LOW (ref 39.0–52.0)
Hemoglobin: 9 g/dL — ABNORMAL LOW (ref 13.0–17.0)
Immature Granulocytes: 1 %
Lymphocytes Relative: 8 %
Lymphs Abs: 0.3 10*3/uL — ABNORMAL LOW (ref 0.7–4.0)
MCH: 30 pg (ref 26.0–34.0)
MCHC: 32.7 g/dL (ref 30.0–36.0)
MCV: 91.7 fL (ref 80.0–100.0)
Monocytes Absolute: 0.3 10*3/uL (ref 0.1–1.0)
Monocytes Relative: 9 %
Neutro Abs: 2.9 10*3/uL (ref 1.7–7.7)
Neutrophils Relative %: 79 %
Platelets: 287 10*3/uL (ref 150–400)
RBC: 3 MIL/uL — ABNORMAL LOW (ref 4.22–5.81)
RDW: 13.5 % (ref 11.5–15.5)
WBC: 3.6 10*3/uL — ABNORMAL LOW (ref 4.0–10.5)
nRBC: 0 % (ref 0.0–0.2)

## 2020-01-29 LAB — AEROBIC/ANAEROBIC CULTURE W GRAM STAIN (SURGICAL/DEEP WOUND)

## 2020-01-29 MED ORDER — PSYLLIUM 95 % PO PACK
1.0000 | PACK | Freq: Every day | ORAL | Status: DC
  Administered 2020-01-29: 1 via ORAL
  Filled 2020-01-29 (×2): qty 1

## 2020-01-29 MED ORDER — ACETAMINOPHEN 10 MG/ML IV SOLN
1000.0000 mg | Freq: Four times a day (QID) | INTRAVENOUS | Status: AC
  Administered 2020-01-29 – 2020-01-30 (×4): 1000 mg via INTRAVENOUS
  Filled 2020-01-29 (×4): qty 100

## 2020-01-29 MED ORDER — CEFAZOLIN SODIUM-DEXTROSE 2-4 GM/100ML-% IV SOLN
2.0000 g | Freq: Three times a day (TID) | INTRAVENOUS | Status: DC
  Administered 2020-01-29 – 2020-02-01 (×9): 2 g via INTRAVENOUS
  Filled 2020-01-29 (×10): qty 100

## 2020-01-29 MED ORDER — PROCHLORPERAZINE EDISYLATE 10 MG/2ML IJ SOLN
10.0000 mg | Freq: Four times a day (QID) | INTRAMUSCULAR | Status: DC | PRN
  Administered 2020-01-29 – 2020-02-05 (×3): 10 mg via INTRAVENOUS
  Filled 2020-01-29 (×3): qty 2

## 2020-01-29 MED ORDER — PANTOPRAZOLE SODIUM 40 MG PO TBEC
40.0000 mg | DELAYED_RELEASE_TABLET | Freq: Every day | ORAL | Status: DC
  Administered 2020-01-29: 40 mg via ORAL
  Filled 2020-01-29 (×2): qty 1

## 2020-01-29 MED ORDER — METHOCARBAMOL 500 MG PO TABS
1000.0000 mg | ORAL_TABLET | Freq: Three times a day (TID) | ORAL | Status: DC
  Administered 2020-01-29: 1000 mg via ORAL
  Filled 2020-01-29: qty 2

## 2020-01-29 NOTE — Progress Notes (Signed)
[facility] Progress Note  3 Days Post-Op  Subjective: Patient reports emesis overnight and indigestion. He reports pain is about the same and feels like he gets jerked around sometimes and it is worse. Patient does not like the fulls. Reports there was a lot of output from ileostomy yesterday.    Objective: Vital signs in last 24 hours: Temp:  [98.3 F (36.8 C)-99.8 F (37.7 C)] 98.4 F (36.9 C) (02/10 0601) Pulse Rate:  [92-109] 96 (02/10 0601) Resp:  [16-17] 16 (02/10 0601) BP: (136-157)/(80-97) 136/92 (02/10 0601) SpO2:  [98 %] 98 % (02/10 0601) Last BM Date: 01/28/20(colostomy)  Intake/Output from previous day: 02/09 0701 - 02/10 0700 In: 1020 [P.O.:1020] Out: 1905 [Urine:1200; Emesis/NG output:400; Stool:305] Intake/Output this shift: Total I/O In: -  Out: 180 [Stool:180]  PE: Gen: Alert, NAD, pleasant Card: Regular rate and rhythm Pulm: Normal effort, clear to auscultation bilaterally Abd: Soft,appropriatelytender,mildlydistended,+BS hypoactive, midline wound clean, ileostomy viable with watery stool in bag, mucus fistula with small amount mucus drainage and stoma appears to be somewhat necrotic   Lab Results:  Recent Labs    01/28/20 0154 01/29/20 0133  WBC 3.0* 3.6*  HGB 8.5* 9.0*  HCT 26.2* 27.5*  PLT 253 287   BMET Recent Labs    01/28/20 0154 01/29/20 0133  NA 136 133*  K 4.1 3.7  CL 100 98  CO2 29 27  GLUCOSE 104* 101*  BUN 16 10  CREATININE 0.81 0.76  CALCIUM 8.8* 8.5*   PT/INR No results for input(s): LABPROT, INR in the last 72 hours. CMP     Component Value Date/Time   NA 133 (L) 01/29/2020 0133   K 3.7 01/29/2020 0133   CL 98 01/29/2020 0133   CO2 27 01/29/2020 0133   GLUCOSE 101 (H) 01/29/2020 0133   BUN 10 01/29/2020 0133   CREATININE 0.76 01/29/2020 0133   CALCIUM 8.5 (L) 01/29/2020 0133   PROT 7.7 01/25/2020 0830   ALBUMIN 3.9 01/25/2020 0830   AST 39 01/25/2020 0830   ALT 45 (H) 01/25/2020 0830   ALKPHOS 64 01/25/2020 0830   BILITOT 1.4 (H) 01/25/2020 0830   GFRNONAA >60 01/29/2020 0133   GFRAA >60 01/29/2020 0133   Lipase  No results found for: LIPASE     Studies/Results: No results found.  Anti-infectives: Anti-infectives (From admission, onward)   Start     Dose/Rate Route Frequency Ordered Stop   01/29/20 1400  ceFAZolin (ANCEF) IVPB 2g/100 mL premix     2 g 200 mL/hr over 30 Minutes Intravenous Every 8 hours 01/29/20 0951     01/26/20 1600  piperacillin-tazobactam (ZOSYN) IVPB 3.375 g  Status:  Discontinued     3.375 g 12.5 mL/hr over 240 Minutes Intravenous Every 8 hours 01/26/20 1532 01/29/20 0950   01/25/20 1330  piperacillin-tazobactam (ZOSYN) IVPB 3.375 g  Status:  Discontinued     3.375 g 12.5 mL/hr over 240 Minutes Intravenous Every 6 hours 01/25/20 1320 01/26/20 1532   01/25/20 1130  piperacillin-tazobactam (ZOSYN) IVPB 3.375 g     3.375 g 100 mL/hr over 30 Minutes Intravenous  Once 01/25/20 1118 01/25/20 1225       Assessment/Plan Hx of prostate cancer s/p XRT  LBO with cecal perforation Carcinomatosis S/p exploratory laparotomy, right hemicolectomy, end ileostomy, mucus fistula of transverse colon - 01/26/20 Dr. Barry Dienes - POD#3 - surgical path pending -patient having ileostomy output but also having some nausea and indigestion - keep on fluids for now, patient prefers clears so  will switch to this - add metamucil for ileostomy output, pt reports as high yesterday although nothing recorded in I/O - Continue therapies, recommending CIR - continue BID dressing changes - increase scheduled robaxin for pain control ABL anemia - expected post-op, hgb 9 this AM, stable   FEN:CLD [id number], lovenox ID:IV zosyn 2/6>> Foley: removed 2/9 Follow up: Dr. Barry Dienes  LOS: 4 days    Brigid Re , [facility] 01/29/2020, 10:48 AM Please see Amion for pager number during day hours 7:00am-4:30pm

## 2020-01-29 NOTE — Progress Notes (Signed)
Inpatient Rehabilitation Admissions Coordinator  Patient getting back into bed to go to XRAY. Continuous nausea and vomiting today , but states feels better now. I briefly explained that his MD will have to speak to MD at [facility] to pursue peer to peer approval for CIR admit. I will not arrange until after further eval of his N/V today. RN CM made aware. I will follow up tomorrow.  Danne Baxter, RN, MSN Rehab Admissions Coordinator [phone] 01/29/2020 2:26 PM

## 2020-01-29 NOTE — Progress Notes (Signed)
PROGRESS NOTE    Juan Richards  QIH:[id number] DOB: [birth year] DOA: 01/25/2020 PCP: [name], MD     Brief Narrative:  Patient is a 64 year old male with a past medical history of prostate cancer status post radiation and prostatectomy who presented with increasing abdominal pain on 01/25/2020. His GI team sent him in due to abdominal distention and loss of appetite. Abdominal x-ray showed findings consistent with small bowel obstruction.  Patient met criteria for sepsis on 2/7 with persistent fever, tachycardia and drop of white blood cell count to 1.3. Repeat abdominal x-ray showed air underneath the diaphragm concerning for perforation he was taken to the operating room on an emergent basis. He was found to have a perforated cecum in addition to small nodules over the segments of the mesentery and bowel concerning for malignancy. The sigmoid colon was adhered to the bladder and wall of the pelvis making unclear if there was a mass.   CIR has been recommended.    New events last 24 hours / Subjective: Awaiting approval for inpatient rehab.  The patient did throw up last night after eating.  He does have an appetite.  Pain is controlled with medication.  Trying to work with physical therapy.  No fevers.  Cultures came back with sensitivity to Ancef.  Assessment & Plan:   Principal Problem:   Bowel perforation (HCC)  S/p surgery on 2/7 Appreciate Gen surg recs Ofirmev and Robaxin  Toradol 30 mg every 6 hours as needed             Oxycodone 5-10 mg q 4 hrs prn Await surgical path             Stop Zosyn, start Ancef 2 g every 8 hours  Zofran for nausea, particularly if he is going to do something he knows will make him nauseous  Working w therapy  Active Problems:   UTI (urinary tract infection)  Change abx to Ancef    Hyponatremia  Stable  Monitor BMP    Anemia  Likely 2/2 surgery and acute illness  Monitor CBC   Diverticulitis of large intestine with perforation   SBO (small bowel obstruction) (HCC)  NGT out  Appreciate Gen surg    Sepsis ([place])  Resolved  Monitor CBC    Tachycardia  Resolved    Neutropenia with fever (HCC)  Resolved    Colovesical fistula   DVT prophylaxis: Lovenox Code Status: Full Family Communication: Self Coming From: Home Disposition Plan: Inpatient rehab Barriers to Discharge: Rehab authorization approval  Consultants:   General surgery  Procedures:   Exploratory laparotomy, right hemicolectomy, end ileostomy, mucus fistula of transverse colonon 01/26/20  Antimicrobials:  Anti-infectives (From admission, onward)   Start     Dose/Rate Route Frequency Ordered Stop   01/29/20 1400  ceFAZolin (ANCEF) IVPB 2g/100 mL premix     2 g 200 mL/hr over 30 Minutes Intravenous Every 8 hours 01/29/20 0951     01/26/20 1600  piperacillin-tazobactam (ZOSYN) IVPB 3.375 g  Status:  Discontinued     3.375 g 12.5 mL/hr over 240 Minutes Intravenous Every 8 hours 01/26/20 1532 01/29/20 0950   01/25/20 1330  piperacillin-tazobactam (ZOSYN) IVPB 3.375 g  Status:  Discontinued     3.375 g 12.5 mL/hr over 240 Minutes Intravenous Every 6 hours 01/25/20 1320 01/26/20 1532   01/25/20 1130  piperacillin-tazobactam (ZOSYN) IVPB 3.375 g     3.375 g 100 mL/hr over 30 Minutes Intravenous  Once 01/25/20 1118 01/25/20 1225  Objective: Vitals:   01/28/20 0509 01/28/20 1300 01/28/20 2154 01/29/20 0601  BP: (!) 142/82 137/80 (!) 157/97 (!) 136/92  Pulse: 71 92 (!) 109 96  Resp: '16 17 16 16  '$ Temp: 97.8 F (36.6 C) 98.3 F (36.8 C) 99.8 F (37.7 C) 98.4 F (36.9 C)  TempSrc: Oral Oral Oral Oral  SpO2: 100% 98% 98% 98%  Weight:      Height:        Intake/Output Summary (Last 24 hours) at 01/29/2020 0954 Last data filed at 01/29/2020 0920 Gross per 24 hour  Intake 600 ml  Output 1585 ml  Net -985 ml   Filed Weights   01/25/20 1327  Weight: 87.2 kg     Examination:  General exam: Appears calm and comfortable  Respiratory system: Clear to auscultation. Respiratory effort normal. No respiratory distress. No conversational dyspnea.  Cardiovascular system: S1 & S2 heard, RRR. No murmurs. No pedal edema. Gastrointestinal system: Defer to [place] team Central nervous system: Alert and oriented. No focal neurological deficits. Speech clear.  Extremities: Symmetric in appearance  Skin: No rashes, lesions or ulcers on exposed skin  Psychiatry: Judgement and insight appear normal. Mood & affect appropriate.   Data Reviewed: I have personally reviewed following labs and imaging studies  CBC: Recent Labs  Lab 01/24/20 1429 01/24/20 1429 01/25/20 0830 01/26/20 0309 01/27/20 0242 01/28/20 0154 01/29/20 0133  WBC 4.7   < > 6.8 1.3* 4.6 3.0* 3.6*  NEUTROABS 2.9  --  5.9  --  4.1 2.5 2.9  HGB 11.9*   < > 12.7* 11.3* 10.7* 8.5* 9.0*  HCT 34.8*   < > 37.9* 34.3* 32.6* 26.2* 27.5*  MCV 89.5   < > 90.2 90.5 91.8 93.2 91.7  PLT 363.0   < > 417* 373 286 253 287   < > = values in this interval not displayed.   Basic Metabolic Panel: Recent Labs  Lab 01/25/20 0830 01/26/20 0309 01/27/20 0242 01/28/20 0154 01/29/20 0133  NA 133* 136 133* 136 133*  K 4.0 3.9 3.9 4.1 3.7  CL 94* 97* 95* 100 98  CO2 '24 27 27 29 27  '$ GLUCOSE 155* 135* 121* 104* 101*  BUN 19 25* '21 16 10  '$ CREATININE 0.93 1.14 0.99 0.81 0.76  CALCIUM 10.3 9.3 8.9 8.8* 8.5*   GFR: Estimated Creatinine Clearance: 102 mL/min (by C-G formula based on SCr of 0.76 mg/dL). Liver Function Tests: Recent Labs  Lab 01/24/20 1429 01/25/20 0830  AST 15 39  ALT 16 45*  ALKPHOS 56 64  BILITOT 0.7 1.4*  PROT 8.3 7.7  ALBUMIN 4.3 3.9   CBG: Recent Labs  Lab 01/28/20 1152 01/28/20 1555 01/28/20 1707 01/28/20 2138 01/29/20 0808  GLUCAP 116* 76 97 90 93   Recent Results (from the past 240 hour(s))  Urine culture     Status: Abnormal   Collection Time: 01/25/20 11:19 AM    Specimen: Urine, Random  Result Value Ref Range Status   Specimen Description URINE, RANDOM  Final   Special Requests   Final    NONE Performed at [facility], [place] [address] [place], [place] [address]    Culture 70,000 COLONIES/mL ESCHERICHIA COLI (A)  Final   Report Status 01/27/2020 FINAL  Final   Organism ID, Bacteria ESCHERICHIA COLI (A)  Final      Susceptibility   Escherichia coli - MIC*    AMPICILLIN >=32 RESISTANT Resistant     CEFAZOLIN <=4 SENSITIVE Sensitive  CEFTRIAXONE <=0.25 SENSITIVE Sensitive     CIPROFLOXACIN <=0.25 SENSITIVE Sensitive     GENTAMICIN >=16 RESISTANT Resistant     IMIPENEM <=0.25 SENSITIVE Sensitive     NITROFURANTOIN <=16 SENSITIVE Sensitive     TRIMETH/SULFA >=320 RESISTANT Resistant     AMPICILLIN/SULBACTAM >=32 RESISTANT Resistant     PIP/TAZO <=4 SENSITIVE Sensitive     * 70,000 COLONIES/mL ESCHERICHIA COLI  SARS CORONAVIRUS 2 (TAT 6-24 HRS) Nasopharyngeal Nasopharyngeal Swab     Status: None   Collection Time: 01/25/20 11:25 AM   Specimen: Nasopharyngeal Swab  Result Value Ref Range Status   SARS Coronavirus 2 NEGATIVE NEGATIVE Final    Comment: (NOTE) SARS-CoV-2 target nucleic acids are NOT DETECTED. The SARS-CoV-2 RNA is generally detectable in upper and lower respiratory specimens during the acute phase of infection. Negative results do not preclude SARS-CoV-2 infection, do not rule out co-infections with other pathogens, and should not be used as the sole basis for treatment or other patient management decisions. Negative results must be combined with clinical observations, patient history, and epidemiological information. The expected result is Negative. Fact Sheet for Patients: SugarRoll.be Fact Sheet for Healthcare Providers: https://www.woods-mathews.com/ This test is not yet approved or cleared by the [place] FDA and  has been authorized for detection and/or diagnosis of  SARS-CoV-2 by FDA under an Emergency Use Authorization (EUA). This EUA will remain  in effect (meaning this test can be used) for the duration of the COVID-19 declaration under Section 56 4(b)(1) of the Act, 21 U.S.C. section 360bbb-3(b)(1), unless the authorization is terminated or revoked sooner. Performed at [facility], [place] [address], [place], [place] [address]   MRSA PCR Screening     Status: None   Collection Time: 01/26/20  9:32 AM   Specimen: Nasal Mucosa; Nasopharyngeal  Result Value Ref Range Status   MRSA by PCR NEGATIVE NEGATIVE Final    Comment:        The GeneXpert MRSA Assay (FDA approved for NASAL specimens only), is one component of a comprehensive MRSA colonization surveillance program. It is not intended to diagnose MRSA infection nor to guide or monitor treatment for MRSA infections. Performed at [facility], [place] [address], [place], [place] [address]   Aerobic/Anaerobic Culture (surgical/deep wound)     Status: None (Preliminary result)   Collection Time: 01/26/20  1:16 PM   Specimen: Other Source; Body Fluid  Result Value Ref Range Status   Specimen Description WOUND ABDOMEN FLUID  Final   Special Requests PATIENT ON FOLLOWING ZOSYN  Final   Gram Stain   Final    MODERATE WBC PRESENT,BOTH PMN AND MONONUCLEAR NO ORGANISMS SEEN Performed at [facility], [address], [place], [place] [address]    Culture RARE ESCHERICHIA COLI  Final   Report Status PENDING  Incomplete   Organism ID, Bacteria ESCHERICHIA COLI  Final      Susceptibility   Escherichia coli - MIC*    AMPICILLIN >=32 RESISTANT Resistant     CEFAZOLIN <=4 SENSITIVE Sensitive     CEFEPIME <=0.12 SENSITIVE Sensitive     CEFTAZIDIME <=1 SENSITIVE Sensitive     CEFTRIAXONE <=0.25 SENSITIVE Sensitive     CIPROFLOXACIN <=0.25 SENSITIVE Sensitive     GENTAMICIN >=16 RESISTANT Resistant     IMIPENEM <=0.25 SENSITIVE Sensitive     TRIMETH/SULFA >=320 RESISTANT Resistant       AMPICILLIN/SULBACTAM >=32 RESISTANT Resistant     PIP/TAZO <=4 SENSITIVE Sensitive     *  RARE ESCHERICHIA COLI     Scheduled Meds: . acetaminophen  650 mg Oral Q6H  . Chlorhexidine Gluconate Cloth  6 each Topical Daily  . enoxaparin (LOVENOX) injection  40 mg Subcutaneous Q24H  . insulin aspart  0-15 Units Subcutaneous TID WC  . methocarbamol  500 mg Oral TID   Continuous Infusions: . 0.9 % NaCl with KCl 20 mEq / L 10 mL/hr at 01/28/20 0808  .  ceFAZolin (ANCEF) IV       LOS: 4 days    Time spent: 18 minutes   Shelda Pal, DO [facility] 01/29/2020, 9:54 AM   Available via Epic secure chat 7am-7pm After these hours, please refer to coverage provider listed on [email or web address]

## 2020-01-29 NOTE — Progress Notes (Signed)
PT Cancellation Note  Patient Details Name: Juan Richards MRN: [id number] DOB: 1956/12/06   Cancelled Treatment:    Reason Eval/Treat Not Completed: Patient declined, no reason specified . Pt reported he is generally unable to participate due to pain, nausea, and general fatigue. He is requesting to wait for therapy until later this afternoon. PT will continue to follow acutely and attempt to treat later today as time/schedule allow.   Hardie Pulley, DPT   Acute Rehabilitation Department Pager #: [phone]   Otho Bellows 01/29/2020, 12:01 PM

## 2020-01-29 NOTE — Plan of Care (Signed)

## 2020-01-30 ENCOUNTER — Inpatient Hospital Stay (HOSPITAL_COMMUNITY): Payer: 59

## 2020-01-30 LAB — CBC WITH DIFFERENTIAL/PLATELET
Abs Immature Granulocytes: 0 10*3/uL (ref 0.00–0.07)
Basophils Absolute: 0 10*3/uL (ref 0.0–0.1)
Basophils Relative: 0 %
Eosinophils Absolute: 0.1 10*3/uL (ref 0.0–0.5)
Eosinophils Relative: 6 %
HCT: 27.5 % — ABNORMAL LOW (ref 39.0–52.0)
Hemoglobin: 9.1 g/dL — ABNORMAL LOW (ref 13.0–17.0)
Lymphocytes Relative: 10 %
Lymphs Abs: 0.2 10*3/uL — ABNORMAL LOW (ref 0.7–4.0)
MCH: 30.2 pg (ref 26.0–34.0)
MCHC: 33.1 g/dL (ref 30.0–36.0)
MCV: 91.4 fL (ref 80.0–100.0)
Monocytes Absolute: 0.4 10*3/uL (ref 0.1–1.0)
Monocytes Relative: 15 %
Neutro Abs: 1.7 10*3/uL (ref 1.7–7.7)
Neutrophils Relative %: 69 %
Platelets: 285 10*3/uL (ref 150–400)
RBC: 3.01 MIL/uL — ABNORMAL LOW (ref 4.22–5.81)
RDW: 13.5 % (ref 11.5–15.5)
WBC: 2.4 10*3/uL — ABNORMAL LOW (ref 4.0–10.5)
nRBC: 0 % (ref 0.0–0.2)
nRBC: 0 /100 WBC

## 2020-01-30 LAB — BASIC METABOLIC PANEL
Anion gap: 13 (ref 5–15)
BUN: 15 mg/dL (ref 8–23)
CO2: 28 mmol/L (ref 22–32)
Calcium: 9 mg/dL (ref 8.9–10.3)
Chloride: 96 mmol/L — ABNORMAL LOW (ref 98–111)
Creatinine, Ser: 0.8 mg/dL (ref 0.61–1.24)
GFR calc Af Amer: >60 mL/min (ref >60)
GFR calc non Af Amer: >60 mL/min (ref >60)
Glucose, Bld: 90 mg/dL (ref 70–99)
Potassium: 3.7 mmol/L (ref 3.5–5.1)
Sodium: 137 mmol/L (ref 135–145)

## 2020-01-30 LAB — GLUCOSE, CAPILLARY
Glucose-Capillary: 81 mg/dL (ref 70–99)
Glucose-Capillary: 84 mg/dL (ref 70–99)
Glucose-Capillary: 83 mg/dL (ref 70–99)
Glucose-Capillary: 70 mg/dL (ref 70–99)

## 2020-01-30 MED ORDER — DEXTROSE-NACL 5-0.45 % IV SOLN: INTRAVENOUS | Status: AC

## 2020-01-30 MED ORDER — PANTOPRAZOLE SODIUM 40 MG IV SOLR
40.0000 mg | INTRAVENOUS | Status: DC
  Administered 2020-01-30 – 2020-02-04 (×6): 40 mg via INTRAVENOUS
  Filled 2020-01-30 (×6): qty 40

## 2020-01-30 MED ORDER — ACETAMINOPHEN 10 MG/ML IV SOLN
1000.0000 mg | Freq: Four times a day (QID) | INTRAVENOUS | Status: AC
  Administered 2020-01-30 – 2020-01-31 (×4): 1000 mg via INTRAVENOUS
  Filled 2020-01-30 (×4): qty 100

## 2020-01-30 NOTE — Consult Note (Signed)
[facility] Nurse ostomy follow up Stoma type/location:  Ileostomy RLQ MF LLQ Stomal assessment/size:  Ileostomy: 1 3/4" round, pink, budded, slight slough noted from 7-11 o'clock MF: 1/2" x 1 3/4" oval shaped, slightly retracted, some pink mucosa seen today. Still has some slough along perimeter   Peristomal assessment: intact at both sites  Treatment options for stomal/peristomal skin: 2" barrier on both sites  Output  Ileostomy; liquid brown MF: pasty brown  Ostomy pouching 2pc.2 3/4" with 2" barrier ring used for ileostomy 2pc 2 1/4" closed end pouch use for MF per patient request.  He would like to use this at home if possible. Requested placement while inpatient to determine functionality  Education provided:  Explained role of ostomy nurse and creation of stoma; particularly for the wife.   Explained stoma characteristics (budded, flush, color, texture, care) Demonstrated pouch change (cutting new skin barrier, measuring stoma, cleaning peristomal skin and stoma, use of barrier ring) Education on emptying when 1/3 to 1/2 full and how to empty Demonstrated use of wick to clean spout  Discussed bathing, diet, gas, medication use Answered patient/family questions:  Patient's wife at bedside; video taken by wife for use later. Explained Scientist, clinical (histocompatibility and immunogenetics). Offered emotional support.   Dr. Donne Hazel in to see patient while [facility] nurse teaching session occurring, he was able to see MF today.  Enrolled patient in [place] Start Discharge program: Yes  [place] Nurse will follow along with you for continued support with ostomy teaching and care [place] MSN, [place], [place], [place], [place]

## 2020-01-30 NOTE — Progress Notes (Signed)
Physical Therapy Treatment Patient Details Name: Juan Richards MRN: [id number] DOB: [birth year] Today's Date: 01/30/2020    History of Present Illness Pt is a 64 y/o male c/o increased bowel pain on 2/6, referred to the ED by GI team due to abdominal extension and loss of appetite, X-ray on 2/6 showed SBO. Developed signs of sepsis on 2/7 and X-ray then showed concern for bowel perforation. Taken to the OR on an emergent basis and received exploratory laparatomy, R hemicolectomy, end ileostomy, and mucous fistula on 2/7. PMH colon diverticulitis with perforation, prostate cancer with hx of radiation treatments, lumbar surgery, prostatectomy    PT Comments    Pt making steady progress with gait distance and all functional mobility. He continues to have increased abdominal pain with movement; however, willing to work and make progress.  Continue to recommend pt transition to CIR for further intensive therapy services to maximize his independence with functional mobility prior to returning home with family support. Pt would continue to benefit from skilled physical therapy services at this time while admitted and after d/c to address the below listed limitations in order to improve overall safety and independence with functional mobility.   Follow Up Recommendations  CIR;Supervision/Assistance - 24 hour     Equipment Recommendations  Rolling walker with 5" wheels;3in1 (PT)    Recommendations for Other Services       Precautions / Restrictions Precautions Precautions: Fall;Other (comment) Precaution Comments: abdominal incision, colostomy/ileostomy bags, multiple IVs, NGT Restrictions Weight Bearing Restrictions: No    Mobility  Bed Mobility Overal bed mobility: Needs Assistance Bed Mobility: Rolling;Sidelying to Sit;Sit to Sidelying Rolling: Supervision Sidelying to sit: Min guard     Sit to sidelying: Min guard General bed mobility comments: pt able to complete bed mobility  with no physical assist. good demo of log roll technique with use of bed rails  Transfers Overall transfer level: Needs assistance Equipment used: None Transfers: Sit to/from Stand Sit to Stand: From elevated surface;Min assist         General transfer comment: min A for stability with transitional movement  Ambulation/Gait Ambulation/Gait assistance: Min guard;Min assist Gait Distance (Feet): 150 Feet Assistive device: IV Pole Gait Pattern/deviations: Step-through pattern;Decreased stride length Gait velocity: decreased   General Gait Details: pt with mild instability overall with one UE support on IV pole, pt declining RW   Stairs             Wheelchair Mobility    Modified Rankin (Stroke Patients Only)       Balance Overall balance assessment: Needs assistance Sitting-balance support: Feet supported;No upper extremity supported Sitting balance-Leahy Scale: Good     Standing balance support: Single extremity supported;During functional activity Standing balance-Leahy Scale: Poor                              Cognition Arousal/Alertness: Awake/alert Behavior During Therapy: WFL for tasks assessed/performed Overall Cognitive Status: Within Functional Limits for tasks assessed                                        Exercises      General Comments        Pertinent Vitals/Pain Pain Assessment: 0-10 Pain Score: 6  Pain Location: abdomen Pain Descriptors / Indicators: Aching;Discomfort;Guarding Pain Intervention(s): Monitored during session;Repositioned;Patient requesting pain meds-RN notified;RN gave pain meds during  session    Home Living                      Prior Function            PT Goals (current goals can now be found in the care plan section) Acute Rehab PT Goals PT Goal Formulation: With patient Time For Goal Achievement: 02/13/20 Potential to Achieve Goals: Good Progress towards PT goals:  Progressing toward goals    Frequency    Min 3X/week      PT Plan Current plan remains appropriate    Co-evaluation              AM-PAC PT "6 Clicks" Mobility   Outcome Measure  Help needed turning from your back to your side while in a flat bed without using bedrails?: A Little Help needed moving from lying on your back to sitting on the side of a flat bed without using bedrails?: A Little Help needed moving to and from a bed to a chair (including a wheelchair)?: A Little Help needed standing up from a chair using your arms (e.g., wheelchair or bedside chair)?: A Lot Help needed to walk in hospital room?: A Little Help needed climbing 3-5 steps with a railing? : A Lot 6 Click Score: 16    End of Session   Activity Tolerance: Patient tolerated treatment well Patient left: in bed;with call bell/phone within reach;with SCD's reapplied Nurse Communication: Mobility status PT Visit Diagnosis: Difficulty in walking, not elsewhere classified (R26.2);Muscle weakness (generalized) (M62.81)     Time: 1528-1600 PT Time Calculation (min) (ACUTE ONLY): 32 min  Charges:  $Gait Training: 8-22 mins $Therapeutic Activity: 8-22 mins                     Anastasio Champion, DPT  Acute Rehabilitation Services Pager [phone] Office [place] 01/30/2020, 4:40 PM

## 2020-01-30 NOTE — Progress Notes (Signed)
PROGRESS NOTE    Juan Richards  [id number] DOB: 05/31/1956 DOA: 01/25/2020 PCP: [name], MD     Brief Narrative:  Patient is a 64 year old male with a past medical history of prostate cancer status post radiation and prostatectomy who presented with increasing abdominal pain on 01/25/2020. His GI team sent him in due to abdominal distention and loss of appetite. Abdominal x-ray showed findings consistent with small bowel obstruction.  Patient met criteria for sepsis on 2/7 with persistent fever, tachycardia and drop of white blood cell count to 1.3. Repeat abdominal x-ray showed air underneath the diaphragm concerning for perforation he was taken to the operating room on an emergent basis. He was found to have a perforated cecum in addition to small nodules over the segments of the mesentery and bowel concerning for malignancy. The sigmoid colon was adhered to the bladder and wall of the pelvis making unclear if there was a mass.  CIR has been recommended for both rehab and physician oversight of postoperative management.   New events last 24 hours / Subjective: Due to nausea/vomting, AXR obtained. Suggestive of ileus, NGT placed again. Patient frustrated with requiring an NG tube again.  Reports feeling better since placement.  Note nausea or vomiting.  Denies having an appetite at this time.  Assessment & Plan:   Principal Problem:   Bowel perforation (HCC)             S/p surgery on 2/7 Appreciate Gen surg recs Ofirmev and Robaxin  Toradol 30 mg every 6 hours as needed Oxycodone 5-10 mg q 4 hrs prn Await surgical path Ancef 2 g every 8 hours             Zofran for nausea, particularly if he is going to do something he knows will make him nauseous             Working w therapy  Active Problems:   UTI (urinary tract infection)             Cont Ancef    Post-operative ileus  Per gen surg   NPO  NGT    Hyponatremia             Stable             Monitor BMP    Anemia             Likely 2/2 surgery and acute illness             Monitor CBC    Diverticulitis of large intestine with perforation   SBO (small bowel obstruction) (HCC)             Appreciate Gen surg    Sepsis (HCC)             Resolved             Monitor CBC    Tachycardia             Resolved     Neutropenia with fever ([place])             Resolved    Colovesical fistula   DVT prophylaxis:Lovenox Code Status:Full Family Communication:Self Coming From:Home Disposition Plan:Inpatient rehab Barriers to Discharge:Rehab authorization approval  Consultants:  General surgery  Procedures:  Exploratory laparotomy, right hemicolectomy, end ileostomy, mucus fistula of transverse colonon 01/26/20   Antimicrobials:  Anti-infectives (From admission, onward)   Start     Dose/Rate Route Frequency Ordered Stop   01/29/20 1400  ceFAZolin (ANCEF) IVPB 2g/100 mL premix     2 g 200 mL/hr over 30 Minutes Intravenous Every 8 hours 01/29/20 0951     01/26/20 1600  piperacillin-tazobactam (ZOSYN) IVPB 3.375 g  Status:  Discontinued     3.375 g 12.5 mL/hr over 240 Minutes Intravenous Every 8 hours 01/26/20 1532 01/29/20 0950   01/25/20 1330  piperacillin-tazobactam (ZOSYN) IVPB 3.375 g  Status:  Discontinued     3.375 g 12.5 mL/hr over 240 Minutes Intravenous Every 6 hours 01/25/20 1320 01/26/20 1532   01/25/20 1130  piperacillin-tazobactam (ZOSYN) IVPB 3.375 g     3.375 g 100 mL/hr over 30 Minutes Intravenous  Once 01/25/20 1118 01/25/20 1225        Objective: Vitals:   01/29/20 1450 01/29/20 2032 01/30/20 0400 01/30/20 0556  BP: 134/86 (!) 147/86 131/81 136/83  Pulse: 99 (!) 103 93 93  Resp: _0 Temp: 98.6 F (37 C) 99.1 F (37.3 C) 97.9 F (36.6 C) 98.4 F (36.9 C)  TempSrc: Oral Oral Oral Oral  SpO2: 98% 98% 99% 98%  Weight:      Height:         Intake/Output Summary (Last 24 hours) at 01/30/2020 1231 Last data filed at 01/30/2020 0900 Gross per 24 hour  Intake 520 ml  Output 2525 ml  Net -2005 ml   Filed Weights   01/25/20 1327  Weight: 87.2 kg    Examination:  General exam: Appears calm and comfortable  Respiratory system: Clear to auscultation. Respiratory effort normal. No respiratory distress. No conversational dyspnea.  Cardiovascular system: S1 & S2 heard, RRR. No murmurs. No pedal edema. Gastrointestinal system: osteomy bags w stool, small amount of gas, central dressing is clean and dry. Central nervous system: Alert and oriented. No focal neurological deficits. Speech clear.  Extremities: Symmetric in appearance  Skin: No rashes, lesions or ulcers on exposed skin  Psychiatry: Judgement and insight appear normal. Mood & affect appropriate.   Data Reviewed: I have personally reviewed following labs and imaging studies  CBC: Recent Labs  Lab 01/25/20 0830 01/25/20 0830 01/26/20 0309 01/27/20 0242 01/28/20 0154 01/29/20 0133 01/30/20 0230  WBC 6.8   < > 1.3* 4.6 3.0* 3.6* 2.4*  NEUTROABS 5.9  --   --  4.1 2.5 2.9 1.7  HGB 12.7*   < > 11.3* 10.7* 8.5* 9.0* 9.1*  HCT 37.9*   < > 34.3* 32.6* 26.2* 27.5* 27.5*  MCV 90.2   < > 90.5 91.8 93.2 91.7 91.4  PLT 417*   < > 373 286 253 287 285   < > = values in this interval not displayed.   Basic Metabolic Panel: Recent Labs  Lab 01/26/20 0309 01/27/20 0242 01/28/20 0154 01/29/20 0133 01/30/20 0230  NA 136 133* 136 133* 137  K 3.9 3.9 4.1 3.7 3.7  CL 97* 95* 100 98 96*  CO2 _1 GLUCOSE 135* 121* 104* 101* 90  BUN 25* _2 CREATININE 1.14 0.99 0.81 0.76 0.80  CALCIUM 9.3 8.9 8.8* 8.5* 9.0   GFR: Estimated Creatinine Clearance: 102 mL/min (by C-G formula based on SCr of 0.8 mg/dL). Liver Function Tests: Recent Labs  Lab 01/24/20 1429 01/25/20 0830  AST 15 39  ALT 16 45*  ALKPHOS 56 64  BILITOT 0.7 1.4*  PROT 8.3 7.7  ALBUMIN  4.3 3.9  CBG: Recent Labs  Lab 01/29/20 1202 01/29/20 1657 01/29/20 2034 01/30/20 0804 01/30/20 1207  GLUCAP 101* 100* 86 81 84    Recent Results (from the past 240 hour(s))  Urine culture     Status: Abnormal   Collection Time: 01/25/20 11:19 AM   Specimen: Urine, Random  Result Value Ref Range Status   Specimen Description URINE, RANDOM  Final   Special Requests   Final    NONE Performed at [facility], [address] [place], [place] [address]    Culture 70,000 COLONIES/mL ESCHERICHIA COLI (A)  Final   Report Status 01/27/2020 FINAL  Final   Organism ID, Bacteria ESCHERICHIA COLI (A)  Final      Susceptibility   Escherichia coli - MIC*    AMPICILLIN >=32 RESISTANT Resistant     CEFAZOLIN <=4 SENSITIVE Sensitive     CEFTRIAXONE <=0.25 SENSITIVE Sensitive     CIPROFLOXACIN <=0.25 SENSITIVE Sensitive     GENTAMICIN >=16 RESISTANT Resistant     IMIPENEM <=0.25 SENSITIVE Sensitive     NITROFURANTOIN <=16 SENSITIVE Sensitive     TRIMETH/SULFA >=320 RESISTANT Resistant     AMPICILLIN/SULBACTAM >=32 RESISTANT Resistant     PIP/TAZO <=4 SENSITIVE Sensitive     * 70,000 COLONIES/mL ESCHERICHIA COLI  SARS CORONAVIRUS 2 (TAT 6-24 HRS) Nasopharyngeal Nasopharyngeal Swab     Status: None   Collection Time: 01/25/20 11:25 AM   Specimen: Nasopharyngeal Swab  Result Value Ref Range Status   SARS Coronavirus 2 NEGATIVE NEGATIVE Final    Comment: (NOTE) SARS-CoV-2 target nucleic acids are NOT DETECTED. The SARS-CoV-2 RNA is generally detectable in upper and lower respiratory specimens during the acute phase of infection. Negative results do not preclude SARS-CoV-2 infection, do not rule out co-infections with other pathogens, and should not be used as the sole basis for treatment or other patient management decisions. Negative results must be combined with clinical observations, patient history, and epidemiological information. The expected result is Negative. Fact Sheet  for Patients: SugarRoll.be Fact Sheet for Healthcare Providers: https://www.woods-mathews.com/ This test is not yet approved or cleared by the [place] FDA and  has been authorized for detection and/or diagnosis of SARS-CoV-2 by FDA under an Emergency Use Authorization (EUA). This EUA will remain  in effect (meaning this test can be used) for the duration of the COVID-19 declaration under Section 56 4(b)(1) of the Act, 21 U.S.C. section 360bbb-3(b)(1), unless the authorization is terminated or revoked sooner. Performed at [facility], [place] [address], [place], [place] [address]   MRSA PCR Screening     Status: None   Collection Time: 01/26/20  9:32 AM   Specimen: Nasal Mucosa; Nasopharyngeal  Result Value Ref Range Status   MRSA by PCR NEGATIVE NEGATIVE Final    Comment:        The GeneXpert MRSA Assay (FDA approved for NASAL specimens only), is one component of a comprehensive MRSA colonization surveillance program. It is not intended to diagnose MRSA infection nor to guide or monitor treatment for MRSA infections. Performed at [facility], [place] [address], [place], [place] [address]   Aerobic/Anaerobic Culture (surgical/deep wound)     Status: None   Collection Time: 01/26/20  1:16 PM   Specimen: Other Source; Body Fluid  Result Value Ref Range Status   Specimen Description WOUND ABDOMEN FLUID  Final   Special Requests PATIENT ON FOLLOWING ZOSYN  Final   Gram Stain   Final    MODERATE WBC PRESENT,BOTH PMN AND MONONUCLEAR NO ORGANISMS SEEN    Culture   Final  RARE ESCHERICHIA COLI RARE CLOSTRIDIUM TERTIUM WITHIN MIXED ANAEROBIC ORGANISMS Performed at [facility], [place] [address], [place], [place] [address]    Report Status 01/29/2020 FINAL  Final   Organism ID, Bacteria ESCHERICHIA COLI  Final      Susceptibility   Escherichia coli - MIC*    AMPICILLIN >=32 RESISTANT Resistant     CEFAZOLIN <=4  SENSITIVE Sensitive     CEFEPIME <=0.12 SENSITIVE Sensitive     CEFTAZIDIME <=1 SENSITIVE Sensitive     CEFTRIAXONE <=0.25 SENSITIVE Sensitive     CIPROFLOXACIN <=0.25 SENSITIVE Sensitive     GENTAMICIN >=16 RESISTANT Resistant     IMIPENEM <=0.25 SENSITIVE Sensitive     TRIMETH/SULFA >=320 RESISTANT Resistant     AMPICILLIN/SULBACTAM >=32 RESISTANT Resistant     PIP/TAZO <=4 SENSITIVE Sensitive     * RARE ESCHERICHIA COLI      Radiology Studies: DG Abd 1 View  Result Date: 01/29/2020 CLINICAL DATA:  Follow-up. Post exploratory laparotomy with right hemicolectomy and end ileostomy. Persistent pain. Nausea today. Evaluate ileus. EXAM: ABDOMEN - 1 VIEW COMPARISON:  Radiograph 01/26/2020, CT 01/25/2020 FINDINGS: Dilated partially air-filled small bowel measuring up to 4.7 cm in the lower abdomen. Ostomy noted in the right lower quadrant. Residual stool in the splenic flexure. Chain sutures noted in the left mid abdomen. No radiopaque calculi or abnormal soft tissue calcifications. IMPRESSION: Bowel-gas pattern most consistent with postoperative ileus with mild gaseous small bowel dilatation. Electronically Signed   By: Keith Rake M.D.   On: 01/29/2020 14:38   DG Abd Portable 1V  Result Date: 01/30/2020 CLINICAL DATA:  Postoperative ileus EXAM: PORTABLE ABDOMEN - 1 VIEW COMPARISON:  Yesterday FINDINGS: Gaseous distention of the colon. An ostomy is seen over the right lower quadrant. Enteric tube with tip at the distal stomach. IMPRESSION: 1. Moderate gaseous distention of the colon. 2. The enteric tube is in good position. Electronically Signed   By: Monte Fantasia M.D.   On: 01/30/2020 09:14   DG Abd Portable 1V  Result Date: 01/29/2020 CLINICAL DATA:  NG tube placement. EXAM: PORTABLE ABDOMEN - 1 VIEW COMPARISON:  Radiograph earlier this day. FINDINGS: Tip and side port of the enteric tube below the diaphragm in the stomach. Dilated small bowel in the lower abdomen again seen.  IMPRESSION: Tip and side port of the enteric tube below the diaphragm in the stomach. Electronically Signed   By: Keith Rake M.D.   On: 01/29/2020 18:27     Scheduled Meds: . Chlorhexidine Gluconate Cloth  6 each Topical Daily  . enoxaparin (LOVENOX) injection  40 mg Subcutaneous Q24H  . insulin aspart  0-15 Units Subcutaneous TID WC  . pantoprazole (PROTONIX) IV  40 mg Intravenous Q24H   Continuous Infusions: . 0.9 % NaCl with KCl 20 mEq / L 100 mL/hr at 01/30/20 0448  . acetaminophen    .  ceFAZolin (ANCEF) IV 2 g (01/30/20 0444)     LOS: 5 days    Time spent: 20 minutes   Shelda Pal, DO [facility] 01/30/2020, 12:31 PM   Available via Epic secure chat 7am-7pm After these hours, please refer to coverage provider listed on [email or web address]

## 2020-01-30 NOTE — Progress Notes (Signed)
Daily Progress Note   Patient Name: Juan Richards       Date: 01/30/2020 DOB: Mar 19, 1956  Age: 64 y.o. MRN#: [id number] Attending Physician: Shelda Pal,* Primary Care Physician: [name], MD Admit Date: 01/25/2020  Reason for Consultation/Follow-up: Establishing goals of care  Subjective: Sitting up in chair, feels okay, pain and nausea well managed - frustrated with care  Length of Stay: 5  Current Medications: Scheduled Meds:  . Chlorhexidine Gluconate Cloth  6 each Topical Daily  . enoxaparin (LOVENOX) injection  40 mg Subcutaneous Q24H  . insulin aspart  0-15 Units Subcutaneous TID WC  . pantoprazole  40 mg Oral Daily    Continuous Infusions: . 0.9 % NaCl with KCl 20 mEq / L 100 mL/hr at 01/30/20 0448  . acetaminophen    .  ceFAZolin (ANCEF) IV 2 g (01/30/20 0444)    PRN Meds: ketorolac, menthol-cetylpyridinium, morphine injection, ondansetron (ZOFRAN) IV, phenol, prochlorperazine  Physical Exam Constitutional:      General: He is not in acute distress.    Comments: NG tube in place  HENT:     Head: Normocephalic and atraumatic.  Pulmonary:     Effort: Pulmonary effort is normal.  Neurological:     Mental Status: He is alert and oriented to person, place, and time.  Psychiatric:        Attention and Perception: Attention normal.        Mood and Affect: Mood is depressed.        Speech: Speech normal.        Behavior: Behavior normal. Behavior is cooperative.        Thought Content: Thought content normal.        Cognition and Memory: Cognition and memory normal.        Judgment: Judgment normal.             Vital Signs: BP 136/83 (BP Location: Left Arm)   Pulse 93   Temp 98.4 F (36.9 C) (Oral)   Resp 20   Ht 5\' 9"  (1.753 m)   Wt 87.2 kg   SpO2 98%    BMI 28.39 kg/m  SpO2: SpO2: 98 % O2 Device: O2 Device: Room Air O2 Flow Rate: O2 Flow Rate (L/min): 2 L/min  Intake/output summary:   Intake/Output Summary (Last 24 hours) at 01/30/2020 1125 Last data filed at 01/30/2020 0900 Gross per 24 hour  Intake 520 ml  Output 2525 ml  Net -2005 ml   LBM: Last BM Date: 01/28/20(colostomy) Baseline Weight: Weight: 87.2 kg Most recent weight: Weight: 87.2 kg       Palliative Assessment/Data: PPS 70%    Flowsheet Rows     Most Recent Value  Intake Tab  Referral Department  Hospitalist  Unit at Time of Referral  Med/Surg Unit  Palliative Care Primary Diagnosis  Cancer  Date Notified  01/26/20  Palliative Care Type  New Palliative care  Reason for referral  Clarify Goals of Care  Date of Admission  01/25/20  Date first seen by Palliative Care  01/28/20  # of days Palliative referral response time  2 Day(s)  # of days IP prior to Palliative referral  1  Clinical Assessment  Palliative Performance Scale Score  60%  Psychosocial & Spiritual Assessment  Palliative Care Outcomes  Patient/Family meeting held?  Yes  Who was at the meeting?  patient  Palliative Care Outcomes  Clarified goals of care, Provided psychosocial or spiritual support      Patient Active Problem List   Diagnosis Date Noted  . Acute blood loss anemia 01/29/2020  . Hyponatremia 01/29/2020  . Palliative care by specialist   . Bowel perforation ([place]) 01/27/2020  . Sepsis ([place]) 01/26/2020  . Neutropenia with fever ([place]) 01/26/2020  . 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 Diverticulitis of large intestine with perforation 01/25/2020  . SBO (small bowel obstruction) ([place]) 01/25/2020  . Prostate cancer ([place]) 09/21/2018  . Pre-diabetes 06/20/2018  . Routine general medical examination at a health care facility 08/11/2016  . Gross hematuria 07/23/2009  . UTI (urinary tract infection) 09/27/2007  . 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 EPIDIDYMAL CYST 08/31/2007     Palliative Care Assessment & Plan   HPI: 64 y.o. male  with past medical history of prostate cancer s/p radiation and prostatectomy, diverticulitis with colon perforation in 2010, and lumbar spine stenosis admitted on 01/25/2020 with abdominal pain, distention, and loss of appetite. Abd xray revealed sm bowel obstruction. On 2/7 patient developed fever and tachycardia. Repeat xray revealed perforation and he was emergently taken to OR. Found to have perforated cecum and small nodules over segments of bowel concerning for malignancy. PMT consulted for goals of care.   Assessment: F/u visit with [name] today. He expresses his concerns and frustrations with care - therapeutic listening and emotional support provided. Also discussed available resources.   He tells he thinks he is speaking with ?oncology later today to discuss future plan of care. He remains interested in all medical interventions offered to prolong life.   [name] is very concerned about proper communication - he would like to be given all details regarding his care.   Recommendations/Plan:  Full code/full scope  Agreeable to CIR  Appreciate chaplain support - continue  Please discuss [name] diagnoses and treatment options in detail with him - he feels like this has not been done as much as he would like and communication about his care is very important to him  Goals of Care and Additional Recommendations:  Limitations on Scope of Treatment: Full Scope Treatment  Code Status:  Full code  Prognosis:   Unable to determine  Discharge Planning:  To Be Determined - CIR?  Care plan was discussed with [name]  Thank you for allowing the Palliative Medicine Team to assist in the care of this patient.   Total Time 35 minutes Prolonged Time Billed  no       Greater than 50%  of this time was spent counseling and coordinating care related to the above assessment and plan.  Juel Burrow, DNP,  [facility] Palliative Medicine Team Team Phone # [phone]  Pager [phone]

## 2020-01-30 NOTE — Progress Notes (Signed)
The chaplain followed-up from a previous visit. Juan Richards expressed that they do not feel compassion from all of the medical personnel that have been treating him. This has left him with an emotional injury. Although he states that some have done a great job of caring for him, his perceived lack of compassion seems to threaten his spiritual health. The chaplain will continue to visit with the patient as being able to talk about what he is going through is helping the patient's emotional health.  Brion Aliment Chaplain Resident For questions concerning this note please contact me by pager [phone]

## 2020-01-30 NOTE — Progress Notes (Signed)
[facility] Progress Note  4 Days Post-Op  Subjective: Patient reports feeling better since NGT placement, no longer nauseated. Still having some abdominal pain with coughing/movement. Still having output from ileostomy and mucus fistula. Patient frustrated with feeling like he has had a setback.    Objective: Vital signs in last 24 hours: Temp:  [97.9 F (36.6 C)-99.1 F (37.3 C)] 98.4 F (36.9 C) (02/11 0556) Pulse Rate:  [93-103] 93 (02/11 0556) Resp:  [18-20] 20 (02/11 0556) BP: (131-147)/(81-86) 136/83 (02/11 0556) SpO2:  [98 %-99 %] 98 % (02/11 0556) Last BM Date: 01/28/20(colostomy)  Intake/Output from previous day: 02/10 0701 - 02/11 0700 In: 520 [P.O.:120; I.V.:400] Out: 2905 [Urine:200; Emesis/NG output:1500; Stool:1205] Intake/Output this shift: Total I/O In: -  Out: 250 [Urine:100; Stool:150]  PE: Gen: Alert, NAD, pleasant Card: Regular rate and rhythm Pulm: Normal effort, clear to auscultation bilaterally Abd: Soft,appropriatelytender,mildlydistended,+BS hypoactive, midline wound clean, ileostomy viable withwatery stool in bag, mucus fistula with soft stool and gas in bag   Lab Results:  Recent Labs    01/29/20 0133 01/30/20 0230  WBC 3.6* 2.4*  HGB 9.0* 9.1*  HCT 27.5* 27.5*  PLT 287 285   BMET Recent Labs    01/29/20 0133 01/30/20 0230  NA 133* 137  K 3.7 3.7  CL 98 96*  CO2 27 28  GLUCOSE 101* 90  BUN 10 15  CREATININE 0.76 0.80  CALCIUM 8.5* 9.0   PT/INR No results for input(s): LABPROT, INR in the last 72 hours. CMP     Component Value Date/Time   NA 137 01/30/2020 0230   K 3.7 01/30/2020 0230   CL 96 (L) 01/30/2020 0230   CO2 28 01/30/2020 0230   GLUCOSE 90 01/30/2020 0230   BUN 15 01/30/2020 0230   CREATININE 0.80 01/30/2020 0230   CALCIUM 9.0 01/30/2020 0230   PROT 7.7 01/25/2020 0830   ALBUMIN 3.9 01/25/2020 0830   AST 39 01/25/2020 0830   ALT 45 (H) 01/25/2020 0830   ALKPHOS 64 01/25/2020 0830   BILITOT 1.4 (H) 01/25/2020 0830   GFRNONAA >60 01/30/2020 0230   GFRAA >60 01/30/2020 0230   Lipase  No results found for: LIPASE     Studies/Results: DG Abd 1 View  Result Date: 01/29/2020 CLINICAL DATA:  Follow-up. Post exploratory laparotomy with right hemicolectomy and end ileostomy. Persistent pain. Nausea today. Evaluate ileus. EXAM: ABDOMEN - 1 VIEW COMPARISON:  Radiograph 01/26/2020, CT 01/25/2020 FINDINGS: Dilated partially air-filled small bowel measuring up to 4.7 cm in the lower abdomen. Ostomy noted in the right lower quadrant. Residual stool in the splenic flexure. Chain sutures noted in the left mid abdomen. No radiopaque calculi or abnormal soft tissue calcifications. IMPRESSION: Bowel-gas pattern most consistent with postoperative ileus with mild gaseous small bowel dilatation. Electronically Signed   By: Keith Rake M.D.   On: 01/29/2020 14:38   DG Abd Portable 1V  Result Date: 01/30/2020 CLINICAL DATA:  Postoperative ileus EXAM: PORTABLE ABDOMEN - 1 VIEW COMPARISON:  Yesterday FINDINGS: Gaseous distention of the colon. An ostomy is seen over the right lower quadrant. Enteric tube with tip at the distal stomach. IMPRESSION: 1. Moderate gaseous distention of the colon. 2. The enteric tube is in good position. Electronically Signed   By: Monte Fantasia M.D.   On: 01/30/2020 09:14   DG Abd Portable 1V  Result Date: 01/29/2020 CLINICAL DATA:  NG tube placement. EXAM: PORTABLE ABDOMEN - 1 VIEW COMPARISON:  Radiograph earlier this day. FINDINGS: Tip and  side port of the enteric tube below the diaphragm in the stomach. Dilated small bowel in the lower abdomen again seen. IMPRESSION: Tip and side port of the enteric tube below the diaphragm in the stomach. Electronically Signed   By: Keith Rake M.D.   On: 01/29/2020 18:27    Anti-infectives: Anti-infectives (From admission, onward)   Start     Dose/Rate Route Frequency Ordered Stop   01/29/20 1400  ceFAZolin (ANCEF)  IVPB 2g/100 mL premix     2 g 200 mL/hr over 30 Minutes Intravenous Every 8 hours 01/29/20 0951     01/26/20 1600  piperacillin-tazobactam (ZOSYN) IVPB 3.375 g  Status:  Discontinued     3.375 g 12.5 mL/hr over 240 Minutes Intravenous Every 8 hours 01/26/20 1532 01/29/20 0950   01/25/20 1330  piperacillin-tazobactam (ZOSYN) IVPB 3.375 g  Status:  Discontinued     3.375 g 12.5 mL/hr over 240 Minutes Intravenous Every 6 hours 01/25/20 1320 01/26/20 1532   01/25/20 1130  piperacillin-tazobactam (ZOSYN) IVPB 3.375 g     3.375 g 100 mL/hr over 30 Minutes Intravenous  Once 01/25/20 1118 01/25/20 1225       Assessment/Plan Hx of prostate cancer s/p XRT  LBO with cecal perforation Carcinomatosis S/p exploratory laparotomy, right hemicolectomy, end ileostomy, mucus fistula of transverse colon - 01/26/20 Dr. Barry Dienes - POD#4 - surgical path pending -NGT replaced yesterday for post-op ileus, still having output from ileostomy - mobilize -Continue therapies, recommending CIR -continue BID dressing changes  ABL anemia- expected post-op, hgb 9.1 this AM, stable   FEN:NPO with ice chips, IVF, NGT to LIWS [id number], lovenox ID:IV zosyn 2/6>> Foley:removed 2/9 Follow up: Dr. Barry Dienes  LOS: 5 days    Brigid Re , [facility] 01/30/2020, 10:26 AM Please see Amion for pager number during day hours 7:00am-4:30pm

## 2020-01-30 NOTE — Consult Note (Signed)
WOC in to assess patient for educational session. He was not feeling well yesterday and had to have his NG replaced.  His wife will be in the room at 1:00pm.  Will return to work with them today. Showed the patient closed end pouches today.  [place], [place], [place]

## 2020-01-30 NOTE — Progress Notes (Addendum)
Pt CBG-70. Pt asymptomatic. Notified on call. M. Denny,NP via text page with new order to change IV fluid to D5 1/2 NS at 33ml/hr.

## 2020-01-30 NOTE — Progress Notes (Signed)
Occupational Therapy Treatment Patient Details Name: Juan Richards MRN: [id number] DOB: [birth year] Today's Date: 01/30/2020    History of present illness 64yo male c/o increased bowel pain on 2/6, referred to the ED by GI team due to abdominal extension and loss of appetite, X-ray on 2/6 showed SBO. Developed signs of sepsis on 2/7 and X-ray then showed concern for bowel perforation. Taken to the OR on an emergent basis and received exploratory laparatomy, R hemicolectomy, end ileostomy, and mucous fistula on 2/7. PMH colon diverticulitis with perforation, prostate cancer with hx of radiation treatments, lumbar surgery, prostatectomy   OT comments  Pt making steady progress towards OT goals this session. Pt reporting improved pain, agreeable to OT session. Pt completed bed mobility with supervision demo'ing good log roll technique. Pt completed functional mobility greater than a household distance using IV pole as AD and min guard mostly to keep gown closed. Initiated education on LB AE for bathing and dressing as pt currently requires MAX A to reach BLEs. Pt hopeful for CIR placement to maximize functional independence before returning home with wife. Will continue to follow acutely per POC.    Follow Up Recommendations  CIR;Supervision/Assistance - 24 hour    Equipment Recommendations  Other (comment)(defer to next venue of care)    Recommendations for Other Services      Precautions / Restrictions Precautions Precautions: Fall;Other (comment) Precaution Comments: abdominal incision, colostomy/ileostomy bags, multiple IVs Restrictions Weight Bearing Restrictions: No       Mobility Bed Mobility Overal bed mobility: Needs Assistance Bed Mobility: Rolling;Sidelying to Sit Rolling: Supervision Sidelying to sit: HOB elevated;Supervision       General bed mobility comments: pt able to complete bed mobility with no physical assist. good demo of log roll technique with use of bed  rails and elevated HOB 22 degrees  Transfers Overall transfer level: Needs assistance Equipment used: None Transfers: Sit to/from Stand Sit to Stand: Min guard         General transfer comment: min guard for initial steadying    Balance Overall balance assessment: Needs assistance Sitting-balance support: Feet supported;No upper extremity supported Sitting balance-Leahy Scale: Good     Standing balance support: Single extremity supported;During functional activity Standing balance-Leahy Scale: Fair                             ADL either performed or assessed with clinical judgement   ADL Overall ADL's : Needs assistance/impaired     Grooming: Wash/dry hands;Sitting;Set up       Lower Body Bathing: Maximal assistance;Sitting/lateral leans Lower Body Bathing Details (indicate cue type and reason): unable to reach feet     Lower Body Dressing: Maximal assistance;Sitting/lateral leans Lower Body Dressing Details (indicate cue type and reason): unable to reach feet; initiated education on LB AE for bathing and dressing; pt hopeful he will not need it Toilet Transfer: Min guard;Ambulation Toilet Transfer Details (indicate cue type and reason): simulated via functional mobility; min guard to keep gown closed; pt used IV pole as AE for balance         Functional mobility during ADLs: Min guard General ADL Comments: session focus on functional mobility, initiated education on LB AE. Pt motivated and seems to progressing well     Vision       Perception     Praxis      Cognition Arousal/Alertness: Awake/alert Behavior During Therapy: WFL for tasks assessed/performed;Anxious Overall Cognitive Status:  Within Functional Limits for tasks assessed                                 General Comments: very pleasant and motivated to complete tasks independently        Exercises     Shoulder Instructions       General Comments       Pertinent Vitals/ Pain       Pain Assessment: Faces Faces Pain Scale: Hurts little more Pain Location: generalized discomfort, and abdominal discomfort Pain Descriptors / Indicators: Aching;Discomfort;Moaning;Guarding Pain Intervention(s): Monitored during session;Repositioned  Home Living                                          Prior Functioning/Environment              Frequency  Min 2X/week        Progress Toward Goals  OT Goals(current goals can now be found in the care plan section)  Progress towards OT goals: Progressing toward goals  Acute Rehab OT Goals Patient Stated Goal: to go to rehab and get stronger OT Goal Formulation: With patient Time For Goal Achievement: 02/11/20 Potential to Achieve Goals: Good  Plan Discharge plan remains appropriate    Co-evaluation                 AM-PAC OT "6 Clicks" Daily Activity     Outcome Measure   Help from another person eating meals?: None Help from another person taking care of personal grooming?: A Little Help from another person toileting, which includes using toliet, bedpan, or urinal?: A Little Help from another person bathing (including washing, rinsing, drying)?: A Little Help from another person to put on and taking off regular upper body clothing?: A Little Help from another person to put on and taking off regular lower body clothing?: A Lot 6 Click Score: 18    End of Session Equipment Utilized During Treatment: Other (comment)(IV pole as AD)  OT Visit Diagnosis: Unsteadiness on feet (R26.81);Muscle weakness (generalized) (M62.81);Pain   Activity Tolerance Patient tolerated treatment well   Patient Left in bed;with call bell/phone within reach   Nurse Communication Mobility status;Other (comment)(needs to be hooked up to NG)        Time: [id number] OT Time Calculation (min): 24 min  Charges: OT General Charges $OT Visit: 1 Visit OT Treatments $Self Care/Home  Management : 8-22 mins $Therapeutic Activity: 8-22 mins  Lanier Clam., COTA/L Acute Rehabilitation Services [phone]    Ihor Gully 01/30/2020, 11:19 AM

## 2020-01-31 LAB — CBC
HCT: 27.6 % — ABNORMAL LOW (ref 39.0–52.0)
Hemoglobin: 9.2 g/dL — ABNORMAL LOW (ref 13.0–17.0)
MCH: 30.3 pg (ref 26.0–34.0)
MCHC: 33.3 g/dL (ref 30.0–36.0)
MCV: 90.8 fL (ref 80.0–100.0)
Platelets: 341 10*3/uL (ref 150–400)
RBC: 3.04 MIL/uL — ABNORMAL LOW (ref 4.22–5.81)
RDW: 13.8 % (ref 11.5–15.5)
WBC: 5.8 10*3/uL (ref 4.0–10.5)
nRBC: 0 % (ref 0.0–0.2)

## 2020-01-31 LAB — CBC WITH DIFFERENTIAL/PLATELET
Abs Immature Granulocytes: 0.16 10*3/uL — ABNORMAL HIGH (ref 0.00–0.07)
Basophils Absolute: 0 10*3/uL (ref 0.0–0.1)
Basophils Relative: 1 %
Eosinophils Absolute: 0 10*3/uL (ref 0.0–0.5)
Eosinophils Relative: 1 %
HCT: 24.8 % — ABNORMAL LOW (ref 39.0–52.0)
Hemoglobin: 8 g/dL — ABNORMAL LOW (ref 13.0–17.0)
Immature Granulocytes: 4 %
Lymphocytes Relative: 9 %
Lymphs Abs: 0.4 10*3/uL — ABNORMAL LOW (ref 0.7–4.0)
MCH: 29.7 pg (ref 26.0–34.0)
MCHC: 32.3 g/dL (ref 30.0–36.0)
MCV: 92.2 fL (ref 80.0–100.0)
Monocytes Absolute: 0.6 10*3/uL (ref 0.1–1.0)
Monocytes Relative: 15 %
Neutro Abs: 2.9 10*3/uL (ref 1.7–7.7)
Neutrophils Relative %: 70 %
Platelets: 307 10*3/uL (ref 150–400)
RBC: 2.69 MIL/uL — ABNORMAL LOW (ref 4.22–5.81)
RDW: 13.6 % (ref 11.5–15.5)
WBC: 4.1 10*3/uL (ref 4.0–10.5)
nRBC: 0 % (ref 0.0–0.2)

## 2020-01-31 LAB — BASIC METABOLIC PANEL
Anion gap: 9 (ref 5–15)
BUN: 12 mg/dL (ref 8–23)
CO2: 25 mmol/L (ref 22–32)
Calcium: 8.7 mg/dL — ABNORMAL LOW (ref 8.9–10.3)
Chloride: 103 mmol/L (ref 98–111)
Creatinine, Ser: 0.75 mg/dL (ref 0.61–1.24)
GFR calc Af Amer: >60 mL/min (ref >60)
GFR calc non Af Amer: >60 mL/min (ref >60)
Glucose, Bld: 96 mg/dL (ref 70–99)
Potassium: 3.5 mmol/L (ref 3.5–5.1)
Sodium: 137 mmol/L (ref 135–145)

## 2020-01-31 LAB — GLUCOSE, CAPILLARY
Glucose-Capillary: 99 mg/dL (ref 70–99)
Glucose-Capillary: 96 mg/dL (ref 70–99)
Glucose-Capillary: 98 mg/dL (ref 70–99)
Glucose-Capillary: 97 mg/dL (ref 70–99)

## 2020-01-31 MED ORDER — BISACODYL 10 MG RE SUPP
10.0000 mg | Freq: Once | RECTAL | Status: AC
  Administered 2020-01-31: 10 mg via RECTAL
  Filled 2020-01-31: qty 1

## 2020-01-31 MED ORDER — BOOST / RESOURCE BREEZE PO LIQD CUSTOM
1.0000 | Freq: Three times a day (TID) | ORAL | Status: DC
  Administered 2020-01-31 – 2020-02-02 (×7): 1 via ORAL

## 2020-01-31 NOTE — Progress Notes (Addendum)
Initial Nutrition Assessment  DOCUMENTATION CODES:   Non-severe (moderate) malnutrition in context of chronic illness  INTERVENTION:   -Boost Breeze po TID, each supplement provides 250 kcal and 9 grams of protein -RD will follow for diet advancement and adjust supplement regimen as appropriate -If prolonged clear liquid diet status is anticipated, consider initiation of nutrition support  NUTRITION DIAGNOSIS:   Moderate Malnutrition related to chronic illness(diverticulitis with perforated cecum) as evidenced by energy intake < 75% for > or equal to 1 month, mild fat depletion, mild muscle depletion, moderate muscle depletion, percent weight loss.  GOAL:   Patient will meet greater than or equal to 90% of their needs  MONITOR:   PO intake, Supplement acceptance, Diet advancement, Labs, Weight trends, Skin, I & O's  REASON FOR ASSESSMENT:   NPO/Clear Liquid Diet    ASSESSMENT:   Juan Richards is a 64 y.o. male with medical history significant of prostate cancer status post robotic assisted prostatectomy 2019, radiation therapy completed in November 2020.  Patient has history of prediabetes.  Last colonoscopy was September 17, 2019 with findings of diverticulosis without diverticulitis, sigmoid stricture was noted.  She had a PET scan October 15, 2019 - for any metastatic disease but positive for sigmoid diverticulitis and small contained perforation.  He has been treated with Flagyl and Cipro for that finding.  He was seen in GI clinic on 01/24/2020 for complaint of abdominal distention, loss of appetite, and constipation.  Routine laboratory was ordered.  He was given a regimen for constipation. Abd films were ordered but report not finalized. On review there is classic "stack of coins" sign c/w SBO. Because of continued pain and distention her presents to [facility] for further evaluation.  Pt admitted with diverticulitis and SBO.   2/7- s/p Procedure(s): Exploratory laparotomy,  right hemicolectomy with end ileostomy, mucus fistula of transverse colon (operative dx: Perforated cecum, carcinomatosis, sigmoid with dense adhesion to pelvic side wall) 2/8- NGT removed 2/9- advanced to clear liquid diet 2/10- NPO due to emesis, NGT replaced  Reviewed I/O's: -95 ml x 24 hours and 982 ml since admission  UOP: 100 ml x 24 hours  NGT output: 800 ml x 24 hours  Ileostomy output: 400 ml x 24 hours  Spoke with pt at bedside, who is frustrated due to lack of progress during hospitalization. He reports he is very eager to obtain nutrition, but also understanding of rationale of NGT. Pt reports NGT has been disconnected to suction this morning with plans to consume clear liquids from the floor. Observed pt consume 2 mango ice pops during visit without difficulty and pt denies any nausea, vomiting, and abdominal distention at this time.   Spent approximately 45 minutes with patient to obtain history and establish rapport with him. Pt shares that he recently finished radiation treatments PTA, however, felt symptoms of early satiety and abdominal pain/tightness approximately 3-4 weeks PTA. These symptoms gradually got worse. Per pt, approximately one week PTA, he could only consume about 1.4 of a [place] sandwich and started vomiting all liquids.   Post-operatively, pt with minimal tolerance of clear liquids. He is anxious about NGT removal and replacement, due to prior experience. He remains hopeful that NGT can be removed soon and diet can be advanced so he can improve nutrition. He is very motivated to progress, but also expresses frustration over debility. He has been trying to walk, but reports "my legs feel like jelly". Per pt, his UBW is around 232#, which he last  weighed approximately one month ago. Reviewed wt hx; noted pt has experienced a 16% wt loss over the past 3 months, which is significant for time frame.   Pt able to teachback to this RD current plan of care. RD discussed  with pt potential for diet progression and eventual transition to soft diet. Pt open to all of RD recommendations and is willing to consume oral nutrition supplements to improve oral intake and nutritional status.   RD offered active and reflective listening during visit; noticed positive improvement in pt's mood during visit, which began as frustrated and tearful and improved to smiling and hopeful. Allowed pt to share stories about his daughter, wife, and dogs, as well as his love for the [employer] football team. Offered additional clear liquids and ice chips off the floor, however, pt politely declined. He expressed appreciation for RD visit.   Medications reviewed and include 0.9% NaCl with KCl with 20 mEq/L infusion @ 100 ml/hr.   Labs reviewed: CBGS: 70-84 (inpatient orders for glycemic control are 0-15 units insulin aspart TID with meals).   NUTRITION - FOCUSED PHYSICAL EXAM:    Most Recent Value  Orbital Region  No depletion  Upper Arm Region  Mild depletion  Thoracic and Lumbar Region  No depletion  Buccal Region  Mild depletion  Temple Region  Moderate depletion  Clavicle Bone Region  No depletion  Clavicle and Acromion Bone Region  No depletion  Scapular Bone Region  No depletion  Dorsal Hand  No depletion  Patellar Region  Mild depletion  Anterior Thigh Region  Mild depletion  Posterior Calf Region  Mild depletion  Edema (RD Assessment)  None  Hair  Reviewed  Eyes  Reviewed  Mouth  Reviewed  Skin  Reviewed  Nails  Reviewed       Diet Order:   Diet Order            Diet NPO time specified Except for: Ice Chips, Sips with Meds, Other (See Comments)  Diet effective now              EDUCATION NEEDS:   Not appropriate for education at this time  Skin:  Skin Assessment: Skin Integrity Issues: Skin Integrity Issues:: Incisions Incisions: closed abdomen  Last BM:  01/31/20 (100 ml output via ileostomy)  Height:   Ht Readings from Last 1 Encounters:   01/25/20 5\' 9"  (1.753 m)    Weight:   Wt Readings from Last 1 Encounters:  01/25/20 87.2 kg    Ideal Body Weight:  72.7 kg  BMI:  Body mass index is 28.39 kg/m.  Estimated Nutritional Needs:   Kcal:  [id number]  Protein:  130-145 grams  Fluid:  > 2.3 L    Loistine Chance, RD, LDN, [place] Registered Dietitian II Certified Diabetes Care and Education Specialist Please refer to [facility] for RD and/or RD on-call/weekend/after hours pager

## 2020-01-31 NOTE — Consult Note (Signed)
[place] Nurse ostomy follow up Stoma type/location:  Ileostomy RLQ MF LLQ  Stomal assessment/size:  Ileostomy: 1 3/4" round, pink, budded, slight slough noted from 7-11 o'clock MF: 1/2" x 1 3/4" oval shaped, slightly retracted, some pink mucosa seen today. Still has some slough along perimeter    Peristomal assessment: intact at both sites   Treatment options for stomal/peristomal skin: 2" barrier on both sites   Output  Ileostomy; liquid brown MF: pasty brown   Ostomy pouching 2pc.2 3/4" with 2" barrier ring used for ileostomy 2pc 2 1/4" closed end pouch use for MF per patient request.  He would like to use this at home if possible. Requested placement while inpatient to determine functionality  Pouching intact this am  Patient frustrated with ileus  Patient irritable; no questions today.   I verified that the patient has adequate supplies in the room for the weekend.   [place] Nurse will follow along with you for continued support with ostomy teaching and care [place] MSN, RN, [place], [place], [place]

## 2020-01-31 NOTE — Progress Notes (Signed)
PROGRESS NOTE    Juan Richards  GEX:[id number] DOB: [birth year] DOA: 01/25/2020 PCP: [name], MD     Brief Narrative:  Patient is a 64 year old male with a past medical history of prostate cancer status post radiation and prostatectomy who presented with increasing abdominal pain on 01/25/2020. His GI team sent him in due to abdominal distention and loss of appetite. Abdominal x-ray showed findings consistent with small bowel obstruction.  Patient met criteria for sepsis on 2/7 with persistent fever, tachycardia and drop of white blood cell count to 1.3. Repeat abdominal x-ray showed air underneath the diaphragm concerning for perforation he was taken to the operating room on an emergent basis. He was found to have a perforated cecum in addition to small nodules over the segments of the mesentery and bowel concerning for malignancy. The sigmoid colon was adhered to the bladder and wall of the pelvis making unclear if there was a mass.  Patient was progressing well postoperatively.  On 2/10, due to nausea/vomting, AXR obtained. Suggestive of ileus, NGT placed again.  CIR has been recommended for both rehab and physician oversight of postoperative management.   New events last 24 hours / Subjective: Frustrated with progress still. Motivated. +pain. +appetite. NGT in place w good output.   Assessment & Plan:   Principal Problem: Bowel perforation ([place]) S/p surgery on 2/7 Appreciate Gen surg recs Ofirmev and Robaxin  Toradol 30 mg every 6 hours as needed Oxycodone 5-10 mg q 4 hrs prn  Morphine prn pain Await surgical path Zofran for nausea Working w therapy  Active Problems: UTI (urinary tract infection) Cont Ancef, last dose should be 2/13 for a total of 7 days on appropriate treamtent    Post-operative ileus             Per gen surg             NPO            NGT  Hyponatremia Currently resolved Monitor BMP  Anemia Likely 2/2 surgery and acute illness  A Hb drop of 1 from yesterday, will check afternoon CBC Monitor CBC daily otherwise  Diverticulitis of large intestine with perforation SBO (small bowel obstruction) ([facility]) Appreciate Gen surg  Sepsis ([place]) Resolved Monitor CBC  Tachycardia Resolved   Neutropenia with fever ([place]) Resolved  Colovesical fistula   DVT prophylaxis:Lovenox Code Status:Full Family Communication:Self Coming From:Home Disposition Plan:Inpatient rehab Barriers to Discharge:Rehabauthorization approval  Consultants:  General surgery  Procedures:  Exploratory laparotomy, right hemicolectomy, end ileostomy, mucus fistula of transverse colonon 01/26/20  Antimicrobials:  Anti-infectives (From admission, onward)   Start     Dose/Rate Route Frequency Ordered Stop   01/29/20 1400  ceFAZolin (ANCEF) IVPB 2g/100 mL premix     2 g 200 mL/hr over 30 Minutes Intravenous Every 8 hours 01/29/20 0951     01/26/20 1600  piperacillin-tazobactam (ZOSYN) IVPB 3.375 g  Status:  Discontinued     3.375 g 12.5 mL/hr over 240 Minutes Intravenous Every 8 hours 01/26/20 1532 01/29/20 0950   01/25/20 1330  piperacillin-tazobactam (ZOSYN) IVPB 3.375 g  Status:  Discontinued     3.375 g 12.5 mL/hr over 240 Minutes Intravenous Every 6 hours 01/25/20 1320 01/26/20 1532   01/25/20 1130  piperacillin-tazobactam (ZOSYN) IVPB 3.375 g     3.375 g 100 mL/hr over 30 Minutes Intravenous  Once 01/25/20 1118 01/25/20 1225        Objective: Vitals:   01/30/20 0556 01/30/20 1452 01/30/20 1956 01/31/20  0538  BP: 136/83 137/82 (!) 153/78 138/79  Pulse: 93 91 92 70  Resp: '20 19 20 16  '$ Temp: 98.4 F (36.9 C) 97.9 F (36.6 C) 100.3 F (37.9 C) 98.2 F (36.8 C)  TempSrc: Oral  Axillary Oral Oral  SpO2: 98% 99% 99% 100%  Weight:      Height:        Intake/Output Summary (Last 24 hours) at 01/31/2020 1026 Last data filed at 01/31/2020 0900 Gross per 24 hour  Intake 1265.08 ml  Output 1050 ml  Net 215.08 ml   Filed Weights   01/25/20 1327  Weight: 87.2 kg    Examination: General exam: Appears calm and comfortable  Respiratory system: Clear to auscultation. Respiratory effort normal. No respiratory distress. No conversational dyspnea.  Cardiovascular system: S1 & S2 heard, RRR. No murmurs. No pedal edema. Gastrointestinal system: dressing is dry, L bag is with some gas w/o contents, R bag w gas and stool Central nervous system: Alert and oriented. No focal neurological deficits. Speech clear.  Extremities: Symmetric in appearance  Psychiatry: Judgement and insight appear normal. Mood & affect appropriate.   Data Reviewed: I have personally reviewed following labs and imaging studies  CBC: Recent Labs  Lab 01/27/20 0242 01/28/20 0154 01/29/20 0133 01/30/20 0230 01/31/20 0411  WBC 4.6 3.0* 3.6* 2.4* 4.1  NEUTROABS 4.1 2.5 2.9 1.7 2.9  HGB 10.7* 8.5* 9.0* 9.1* 8.0*  HCT 32.6* 26.2* 27.5* 27.5* 24.8*  MCV 91.8 93.2 91.7 91.4 92.2  PLT 286 253 287 285 264   Basic Metabolic Panel: Recent Labs  Lab 01/27/20 0242 01/28/20 0154 01/29/20 0133 01/30/20 0230 01/31/20 0411  NA 133* 136 133* 137 137  K 3.9 4.1 3.7 3.7 3.5  CL 95* 100 98 96* 103  CO2 '27 29 27 28 25  '$ GLUCOSE 121* 104* 101* 90 96  BUN '21 16 10 15 12  '$ CREATININE 0.99 0.81 0.76 0.80 0.75  CALCIUM 8.9 8.8* 8.5* 9.0 8.7*   GFR: Estimated Creatinine Clearance: 102 mL/min (by C-G formula based on SCr of 0.75 mg/dL). Liver Function Tests: Recent Labs  Lab 01/24/20 1429 01/25/20 0830  AST 15 39  ALT 16 45*  ALKPHOS 56 64  BILITOT 0.7 1.4*  PROT 8.3 7.7  ALBUMIN 4.3 3.9   CBG: Recent Labs  Lab 01/30/20 0804 01/30/20 1207 01/30/20 1630 01/30/20 1958 01/31/20 0741  GLUCAP  81 84 83 70 99   Recent Results (from the past 240 hour(s))  Urine culture     Status: Abnormal   Collection Time: 01/25/20 11:19 AM   Specimen: Urine, Random  Result Value Ref Range Status   Specimen Description URINE, RANDOM  Final   Special Requests   Final    NONE Performed at [facility], [place] [address] [place], [place] [address]    Culture 70,000 COLONIES/mL ESCHERICHIA COLI (A)  Final   Report Status 01/27/2020 FINAL  Final   Organism ID, Bacteria ESCHERICHIA COLI (A)  Final      Susceptibility   Escherichia coli - MIC*    AMPICILLIN >=32 RESISTANT Resistant     CEFAZOLIN <=4 SENSITIVE Sensitive     CEFTRIAXONE <=0.25 SENSITIVE Sensitive     CIPROFLOXACIN <=0.25 SENSITIVE Sensitive     GENTAMICIN >=16 RESISTANT Resistant     IMIPENEM <=0.25 SENSITIVE Sensitive     NITROFURANTOIN <=16 SENSITIVE Sensitive     TRIMETH/SULFA >=320 RESISTANT Resistant     AMPICILLIN/SULBACTAM >=32 RESISTANT Resistant  PIP/TAZO <=4 SENSITIVE Sensitive     * 70,000 COLONIES/mL ESCHERICHIA COLI  SARS CORONAVIRUS 2 (TAT 6-24 HRS) Nasopharyngeal Nasopharyngeal Swab     Status: None   Collection Time: 01/25/20 11:25 AM   Specimen: Nasopharyngeal Swab  Result Value Ref Range Status   SARS Coronavirus 2 NEGATIVE NEGATIVE Final    Comment: (NOTE) SARS-CoV-2 target nucleic acids are NOT DETECTED. The SARS-CoV-2 RNA is generally detectable in upper and lower respiratory specimens during the acute phase of infection. Negative results do not preclude SARS-CoV-2 infection, do not rule out co-infections with other pathogens, and should not be used as the sole basis for treatment or other patient management decisions. Negative results must be combined with clinical observations, patient history, and epidemiological information. The expected result is Negative. Fact Sheet for Patients: SugarRoll.be Fact Sheet for Healthcare  Providers: https://www.woods-mathews.com/ This test is not yet approved or cleared by the [place] FDA and  has been authorized for detection and/or diagnosis of SARS-CoV-2 by FDA under an Emergency Use Authorization (EUA). This EUA will remain  in effect (meaning this test can be used) for the duration of the COVID-19 declaration under Section 56 4(b)(1) of the Act, 21 U.S.C. section 360bbb-3(b)(1), unless the authorization is terminated or revoked sooner. Performed at [facility], [place] [address], [place], [place] [address]   MRSA PCR Screening     Status: None   Collection Time: 01/26/20  9:32 AM   Specimen: Nasal Mucosa; Nasopharyngeal  Result Value Ref Range Status   MRSA by PCR NEGATIVE NEGATIVE Final    Comment:        The GeneXpert MRSA Assay (FDA approved for NASAL specimens only), is one component of a comprehensive MRSA colonization surveillance program. It is not intended to diagnose MRSA infection nor to guide or monitor treatment for MRSA infections. Performed at [facility], [place] [address] [place], [place] [address]   Aerobic/Anaerobic Culture (surgical/deep wound)     Status: None   Collection Time: 01/26/20  1:16 PM   Specimen: Other Source; Body Fluid  Result Value Ref Range Status   Specimen Description WOUND ABDOMEN FLUID  Final   Special Requests PATIENT ON FOLLOWING ZOSYN  Final   Gram Stain   Final    MODERATE WBC PRESENT,BOTH PMN AND MONONUCLEAR NO ORGANISMS SEEN    Culture   Final    RARE ESCHERICHIA COLI RARE CLOSTRIDIUM TERTIUM WITHIN MIXED ANAEROBIC ORGANISMS Performed at [facility], [place] [address], [place], [place] [address]    Report Status 01/29/2020 FINAL  Final   Organism ID, Bacteria ESCHERICHIA COLI  Final      Susceptibility   Escherichia coli - MIC*    AMPICILLIN >=32 RESISTANT Resistant     CEFAZOLIN <=4 SENSITIVE Sensitive     CEFEPIME <=0.12 SENSITIVE Sensitive     CEFTAZIDIME <=1  SENSITIVE Sensitive     CEFTRIAXONE <=0.25 SENSITIVE Sensitive     CIPROFLOXACIN <=0.25 SENSITIVE Sensitive     GENTAMICIN >=16 RESISTANT Resistant     IMIPENEM <=0.25 SENSITIVE Sensitive     TRIMETH/SULFA >=320 RESISTANT Resistant     AMPICILLIN/SULBACTAM >=32 RESISTANT Resistant     PIP/TAZO <=4 SENSITIVE Sensitive     * RARE ESCHERICHIA COLI      Radiology Studies: DG Abd 1 View  Result Date: 01/29/2020 CLINICAL DATA:  Follow-up. Post exploratory laparotomy with right hemicolectomy and end ileostomy. Persistent pain. Nausea today. Evaluate ileus. EXAM: ABDOMEN - 1 VIEW COMPARISON:  Radiograph 01/26/2020, CT 01/25/2020 FINDINGS: Dilated partially air-filled small bowel measuring up to 4.7 cm in the lower abdomen. Ostomy noted in the right lower quadrant. Residual stool in the splenic flexure. Chain sutures noted in the left mid abdomen. No radiopaque calculi or abnormal soft tissue calcifications. IMPRESSION: Bowel-gas pattern most consistent with postoperative ileus with mild gaseous small bowel dilatation. Electronically Signed   By: Keith Rake M.D.   On: 01/29/2020 14:38   DG Abd Portable 1V  Result Date: 01/30/2020 CLINICAL DATA:  Postoperative ileus EXAM: PORTABLE ABDOMEN - 1 VIEW COMPARISON:  Yesterday FINDINGS: Gaseous distention of the colon. An ostomy is seen over the right lower quadrant. Enteric tube with tip at the distal stomach. IMPRESSION: 1. Moderate gaseous distention of the colon. 2. The enteric tube is in good position. Electronically Signed   By: Monte Fantasia M.D.   On: 01/30/2020 09:14   DG Abd Portable 1V  Result Date: 01/29/2020 CLINICAL DATA:  NG tube placement. EXAM: PORTABLE ABDOMEN - 1 VIEW COMPARISON:  Radiograph earlier this day. FINDINGS: Tip and side port of the enteric tube below the diaphragm in the stomach. Dilated small bowel in the lower abdomen again seen. IMPRESSION: Tip and side port of the enteric tube below the diaphragm in the stomach.  Electronically Signed   By: Keith Rake M.D.   On: 01/29/2020 18:27     Scheduled Meds: . Chlorhexidine Gluconate Cloth  6 each Topical Daily  . enoxaparin (LOVENOX) injection  40 mg Subcutaneous Q24H  . insulin aspart  0-15 Units Subcutaneous TID WC  . pantoprazole (PROTONIX) IV  40 mg Intravenous Q24H   Continuous Infusions: . 0.9 % NaCl with KCl 20 mEq / L 100 mL/hr at 01/31/20 0930  .  ceFAZolin (ANCEF) IV 2 g (01/31/20 0536)     LOS: 6 days    Time spent: 25 minutes   Shelda Pal, DO [facility] 01/31/2020, 10:26 AM   Available via Epic secure chat 7am-7pm After these hours, please refer to coverage provider listed on [email or web address]

## 2020-01-31 NOTE — Progress Notes (Signed)
OT Cancellation Note  Patient Details Name: KEMARIAN AREVALO MRN: [id number] DOB: Aug 29, 1956   Cancelled Treatment:    Reason Eval/Treat Not Completed: Pain limiting ability to participate;Other (comment). Upon arrival and introduction of OT, pt shaking head and stated "It ain't happening, I am in pain and waiting for my nurse to come check my belly".   Emmit Alexanders [facility] 01/31/2020, 12:35 PM

## 2020-01-31 NOTE — Progress Notes (Signed)
[facility] Progress Note  5 Days Post-Op  Subjective: Patient reports abdomen is sore from being up and washing up this AM. Denies nausea at this time. Feels like he needs to have a BM from rectum. Tired and feeling a little weak. Pain still present but he feels like current pain regimen is working for him. He is concerned about urinary continence overnight, we discussed possible condom catheter or wick overnight to help with this, notified nursing staff as well.    Objective: Vital signs in last 24 hours: Temp:  [97.9 F (36.6 C)-100.3 F (37.9 C)] 98.2 F (36.8 C) (02/12 0538) Pulse Rate:  [70-92] 70 (02/12 0538) Resp:  [16-20] 16 (02/12 0538) BP: (137-153)/(78-82) 138/79 (02/12 0538) SpO2:  [99 %-100 %] 100 % (02/12 0538) Last BM Date: 01/28/20(colostomy)  Intake/Output from previous day: 02/11 0701 - 02/12 0700 In: 1205.1 [P.O.:30; I.V.:675.1; IV Piggyback:500] Out: 1300 [Urine:100; Emesis/NG output:800; Stool:400] Intake/Output this shift: Total I/O In: 38 [P.O.:60] Out: -   PE: Gen: Alert, NAD, pleasant Card: Regular rate and rhythm Pulm: Normal effort, clear to auscultation bilaterally Abd: Soft,appropriatelytender,mildlydistended,+BS hypoactive, midline wound clean, ileostomy viable withwatery stool in bag, mucus fistula with scabbing over stoma   Lab Results:  Recent Labs    01/30/20 0230 01/31/20 0411  WBC 2.4* 4.1  HGB 9.1* 8.0*  HCT 27.5* 24.8*  PLT 285 307   BMET Recent Labs    01/30/20 0230 01/31/20 0411  NA 137 137  K 3.7 3.5  CL 96* 103  CO2 28 25  GLUCOSE 90 96  BUN 15 12  CREATININE 0.80 0.75  CALCIUM 9.0 8.7*   PT/INR No results for input(s): LABPROT, INR in the last 72 hours. CMP     Component Value Date/Time   NA 137 01/31/2020 0411   K 3.5 01/31/2020 0411   CL 103 01/31/2020 0411   CO2 25 01/31/2020 0411   GLUCOSE 96 01/31/2020 0411   BUN 12 01/31/2020 0411   CREATININE 0.75 01/31/2020 0411   CALCIUM  8.7 (L) 01/31/2020 0411   PROT 7.7 01/25/2020 0830   ALBUMIN 3.9 01/25/2020 0830   AST 39 01/25/2020 0830   ALT 45 (H) 01/25/2020 0830   ALKPHOS 64 01/25/2020 0830   BILITOT 1.4 (H) 01/25/2020 0830   GFRNONAA >60 01/31/2020 0411   GFRAA >60 01/31/2020 0411   Lipase  No results found for: LIPASE     Studies/Results: DG Abd 1 View  Result Date: 01/29/2020 CLINICAL DATA:  Follow-up. Post exploratory laparotomy with right hemicolectomy and end ileostomy. Persistent pain. Nausea today. Evaluate ileus. EXAM: ABDOMEN - 1 VIEW COMPARISON:  Radiograph 01/26/2020, CT 01/25/2020 FINDINGS: Dilated partially air-filled small bowel measuring up to 4.7 cm in the lower abdomen. Ostomy noted in the right lower quadrant. Residual stool in the splenic flexure. Chain sutures noted in the left mid abdomen. No radiopaque calculi or abnormal soft tissue calcifications. IMPRESSION: Bowel-gas pattern most consistent with postoperative ileus with mild gaseous small bowel dilatation. Electronically Signed   By: Keith Rake M.D.   On: 01/29/2020 14:38   DG Abd Portable 1V  Result Date: 01/30/2020 CLINICAL DATA:  Postoperative ileus EXAM: PORTABLE ABDOMEN - 1 VIEW COMPARISON:  Yesterday FINDINGS: Gaseous distention of the colon. An ostomy is seen over the right lower quadrant. Enteric tube with tip at the distal stomach. IMPRESSION: 1. Moderate gaseous distention of the colon. 2. The enteric tube is in good position. Electronically Signed   By: Neva Seat.D.  On: 01/30/2020 09:14   DG Abd Portable 1V  Result Date: 01/29/2020 CLINICAL DATA:  NG tube placement. EXAM: PORTABLE ABDOMEN - 1 VIEW COMPARISON:  Radiograph earlier this day. FINDINGS: Tip and side port of the enteric tube below the diaphragm in the stomach. Dilated small bowel in the lower abdomen again seen. IMPRESSION: Tip and side port of the enteric tube below the diaphragm in the stomach. Electronically Signed   By: Keith Rake M.D.   On:  01/29/2020 18:27    Anti-infectives: Anti-infectives (From admission, onward)   Start     Dose/Rate Route Frequency Ordered Stop   01/29/20 1400  ceFAZolin (ANCEF) IVPB 2g/100 mL premix     2 g 200 mL/hr over 30 Minutes Intravenous Every 8 hours 01/29/20 0951     01/26/20 1600  piperacillin-tazobactam (ZOSYN) IVPB 3.375 g  Status:  Discontinued     3.375 g 12.5 mL/hr over 240 Minutes Intravenous Every 8 hours 01/26/20 1532 01/29/20 0950   01/25/20 1330  piperacillin-tazobactam (ZOSYN) IVPB 3.375 g  Status:  Discontinued     3.375 g 12.5 mL/hr over 240 Minutes Intravenous Every 6 hours 01/25/20 1320 01/26/20 1532   01/25/20 1130  piperacillin-tazobactam (ZOSYN) IVPB 3.375 g     3.375 g 100 mL/hr over 30 Minutes Intravenous  Once 01/25/20 1118 01/25/20 1225       Assessment/Plan Hx of prostate cancer s/p XRT  LBO with cecal perforation Carcinomatosis S/p exploratory laparotomy, right hemicolectomy, end ileostomy, mucus fistula of transverse colon - 01/26/20 Dr. Barry Dienes - POD#5 - surgical path pending - spoke with pathology yesterday and they are waiting on some stainings -clamp NGT and trial clears today  - mobilize -Continue therapies, recommending CIR -continue BID dressing changes  ABL anemia- expected post-op, hgb8 this AM, follow CBC   QN:[id number] of clears, IVF, NGT clamped ID:[id number], lovenox ID:IV zosyn 2/6>2/10; Ancef 2/10>> Foley:removed 2/9 Follow up: Dr. Barry Dienes  LOS: 6 days    Brigid Re , [facility] 01/31/2020, 10:35 AM Please see Amion for pager number during day hours 7:00am-4:30pm

## 2020-02-01 DIAGNOSIS — C762 Malignant neoplasm of abdomen: Secondary | ICD-10-CM

## 2020-02-01 DIAGNOSIS — E440 Moderate protein-calorie malnutrition: Secondary | ICD-10-CM

## 2020-02-01 LAB — GLUCOSE, CAPILLARY
Glucose-Capillary: 90 mg/dL (ref 70–99)
Glucose-Capillary: 119 mg/dL — ABNORMAL HIGH (ref 70–99)
Glucose-Capillary: 100 mg/dL — ABNORMAL HIGH (ref 70–99)
Glucose-Capillary: 93 mg/dL (ref 70–99)

## 2020-02-01 LAB — CBC
HCT: 26 % — ABNORMAL LOW (ref 39.0–52.0)
Hemoglobin: 8.2 g/dL — ABNORMAL LOW (ref 13.0–17.0)
MCH: 29.3 pg (ref 26.0–34.0)
MCHC: 31.5 g/dL (ref 30.0–36.0)
MCV: 92.9 fL (ref 80.0–100.0)
Platelets: 336 10*3/uL (ref 150–400)
RBC: 2.8 MIL/uL — ABNORMAL LOW (ref 4.22–5.81)
RDW: 13.9 % (ref 11.5–15.5)
WBC: 5.7 10*3/uL (ref 4.0–10.5)
nRBC: 0 % (ref 0.0–0.2)

## 2020-02-01 LAB — BASIC METABOLIC PANEL
Anion gap: 9 (ref 5–15)
BUN: 9 mg/dL (ref 8–23)
CO2: 23 mmol/L (ref 22–32)
Calcium: 8.7 mg/dL — ABNORMAL LOW (ref 8.9–10.3)
Chloride: 104 mmol/L (ref 98–111)
Creatinine, Ser: 0.73 mg/dL (ref 0.61–1.24)
GFR calc Af Amer: >60 mL/min (ref >60)
GFR calc non Af Amer: >60 mL/min (ref >60)
Glucose, Bld: 91 mg/dL (ref 70–99)
Potassium: 3.9 mmol/L (ref 3.5–5.1)
Sodium: 136 mmol/L (ref 135–145)

## 2020-02-01 LAB — PREALBUMIN: Prealbumin: 8.8 mg/dL — ABNORMAL LOW (ref 18–38)

## 2020-02-01 MED ORDER — LIP MEDEX EX OINT
1.0000 "application " | TOPICAL_OINTMENT | Freq: Two times a day (BID) | CUTANEOUS | Status: DC
  Administered 2020-02-01 – 2020-02-07 (×13): 1 via TOPICAL
  Filled 2020-02-01: qty 7

## 2020-02-01 MED ORDER — DIPHENHYDRAMINE HCL 50 MG/ML IJ SOLN: 12.5000 mg | Freq: Four times a day (QID) | INTRAMUSCULAR | Status: DC | PRN

## 2020-02-01 MED ORDER — ENALAPRILAT 1.25 MG/ML IV SOLN
0.6250 mg | Freq: Four times a day (QID) | INTRAVENOUS | Status: DC | PRN
  Filled 2020-02-01: qty 1

## 2020-02-01 MED ORDER — MORPHINE SULFATE (PF) 4 MG/ML IV SOLN
4.0000 mg | Freq: Once | INTRAVENOUS | Status: AC
  Administered 2020-02-01: 4 mg via INTRAVENOUS
  Filled 2020-02-01: qty 1

## 2020-02-01 MED ORDER — MAGIC MOUTHWASH
15.0000 mL | Freq: Four times a day (QID) | ORAL | Status: DC | PRN
  Filled 2020-02-01: qty 15

## 2020-02-01 MED ORDER — METHOCARBAMOL 1000 MG/10ML IJ SOLN
1000.0000 mg | Freq: Four times a day (QID) | INTRAVENOUS | Status: DC | PRN
  Administered 2020-02-01 – 2020-02-02 (×3): 1000 mg via INTRAVENOUS
  Filled 2020-02-01 (×5): qty 10

## 2020-02-01 MED ORDER — NAPHAZOLINE-GLYCERIN 0.012-0.2 % OP SOLN
1.0000 [drp] | Freq: Four times a day (QID) | OPHTHALMIC | Status: DC | PRN
  Filled 2020-02-01: qty 15

## 2020-02-01 MED ORDER — ACETAMINOPHEN 650 MG RE SUPP: 650.0000 mg | Freq: Four times a day (QID) | RECTAL | Status: DC | PRN

## 2020-02-01 MED ORDER — ALUM & MAG HYDROXIDE-SIMETH 200-200-20 MG/5ML PO SUSP: 30.0000 mL | Freq: Four times a day (QID) | ORAL | Status: DC | PRN

## 2020-02-01 NOTE — Progress Notes (Signed)
PROGRESS NOTE    HARMAN LANGHANS  JIR:[id number] DOB: 1956-04-13 DOA: 01/25/2020  PCP: [name], MD   Brief Narrative:  Patient is a 64 year old male with a past medical history of prostate cancer status post radiation and prostatectomy who presented with increasing abdominal pain on 01/25/2020. His GI team sent him in due to abdominal distention and loss of appetite. Abdominal x-ray showed findings consistent with small bowel obstruction. Patient met criteria for sepsis on 2/7 with persistent fever, tachycardia and drop of white blood cell count to 1.3. Repeat abdominal x-ray showed air underneath the diaphragm concerning for perforation he was taken to the operating room on an emergent basis. He was found to have a perforated cecum in addition to small nodules over the segments of the mesentery and bowel concerning for malignancy. The sigmoid colon was adhered to the bladder and wall of the pelvis making unclear if there was a mass. Patient was progressing well postoperatively.  On 2/10, due to nausea/vomting, abdominal x-ray was obtained . Suggestive of ileus, NGT placed again. CIR has been recommendedfor both rehab and physician oversight of postoperative management.  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  Large bowel obstruction/cecal perforation status post ex lap with right hemicolectomy and end ileostomy 2.  Urinary tract infection 3.  Sepsis, present on admission 4.  Anemia due to acute blood loss 5.  Abdominal  carcinomatosis 6.  Protein calorie malnutrition, moderate  1.  Large bowel obstruction/cecal perforation, status post ex lap with right hemicolectomy and end ileostomy by general surgery.  Patient developed postop ileus and currently on NG tube.  Surgery is managing.  2.  Urinary tract infection patient has been on Ancef which is completed today 02/01/2020. Currently without any symptoms continue to monitor  3.  Sepsis, present on admission, secondary to large bowel perforation.  Resolved.  4.  Anemia due to acute blood loss.  Hemoglobin staying steady at 8.2 today Continue to monitor CBC and transfuse if hemoglobin is less than 7.0  5.  Abdominal carcinomatosis. Awaiting for surgical pathology report to determine site.  Patient has a history of prostate cancer status post radiation therapy and prostatectomy.  6.  Moderate protein calorie malnutrition Nutrition on board Continue with dietary supplementation    DVT prophylaxis: Lovenox subacute Code Status: Full code  Family Communication: None at bedside  Disposition Plan: Patient is from home and may likely be discharged to inpatient rehab. There is no barrier to discharge if accepted for inpatient rehab.  Consultants:   General surgery  Procedures:  01/26/2020 exploratory laparotomy, right hemicolectomy and end ileostomy, mucous fistula of the transverse colon    Antimicrobials: 01/25/2020 Zosyn 01/29/2020 cefazolin   Subjective: Patient was seen and evaluated at bedside.  Sitting up in chair not in acute distress.  NG tube clamped.  Patient endorsed passing of gas. Surgery planning for wound VAC placement today.  Will order one-time morphine prior to wound VAC placement.  Objective: Vitals:   01/31/20 1535 01/31/20 2117 02/01/20 0516 02/01/20  1545  BP: (!) 149/82 139/79 (!) 145/83 (!) 144/90  Pulse: 77 77 70 73  Resp: '16 16 16 20  '$ Temp: 99.7 F (37.6 C) 99.9 F (37.7 C) 98.2 F (36.8 C) 98.5 F (36.9 C)  TempSrc:  Oral Oral Oral Oral  SpO2: 100% 98% 100% 100%  Weight:      Height:        Intake/Output Summary (Last 24 hours) at 02/01/2020 1928 Last data filed at 02/01/2020 1738 Gross per 24 hour  Intake 1560 ml  Output 1595 ml  Net -35 ml   Filed Weights   01/25/20 1327  Weight: 87.2 kg    Examination:  General exam: Appears calm and comfortable NG tube in place, clamped Respiratory system: Clear to auscultation. Respiratory effort normal. Cardiovascular system: S1 & S2 heard, RRR. No JVD, murmurs, rubs, gallops or clicks. No pedal edema. Gastrointestinal system: Status post exploratory lap abdomen. Wound dressing clean and dry.  Mild abdominal distention and tenderness at incision site with palpation.  Bowel sounds were present  Central nervous system: Alert and oriented. No focal neurological deficits. Extremities: Symmetric 5 x 5 power. Skin: No rashes, lesions or ulcers Psychiatry: Judgement and insight appear normal. Mood & affect appropriate.     Data Reviewed: I have personally reviewed following labs and imaging studies  CBC: Recent Labs  Lab 01/27/20 0242 01/27/20 0242 01/28/20 0154 01/28/20 0154 01/29/20 0133 01/30/20 0230 01/31/20 0411 01/31/20 1303 02/01/20 0537  WBC 4.6   < > 3.0*   < > 3.6* 2.4* 4.1 5.8 5.7  NEUTROABS 4.1  --  2.5  --  2.9 1.7 2.9  --   --   HGB 10.7*   < > 8.5*   < > 9.0* 9.1* 8.0* 9.2* 8.2*  HCT 32.6*   < > 26.2*   < > 27.5* 27.5* 24.8* 27.6* 26.0*  MCV 91.8   < > 93.2   < > 91.7 91.4 92.2 90.8 92.9  PLT 286   < > 253   < > 287 285 307 341 336   < > = values in this interval not displayed.   Basic Metabolic Panel: Recent Labs  Lab 01/28/20 0154 01/29/20 0133 01/30/20 0230 01/31/20 0411 02/01/20 0537  NA 136 133* 137 137 136  K 4.1 3.7 3.7 3.5 3.9  CL 100 98 96* 103 104  CO2 '29 27 28 25 23  '$ GLUCOSE 104* 101* 90 96 91  BUN '16 10 15 12 9  '$ CREATININE 0.81 0.76 0.80 0.75 0.73  CALCIUM 8.8* 8.5* 9.0 8.7* 8.7*   GFR: Estimated  Creatinine Clearance: 102 mL/min (by C-G formula based on SCr of 0.73 mg/dL). Liver Function Tests: No results for input(s): AST, ALT, ALKPHOS, BILITOT, PROT, ALBUMIN in the last 168 hours. No results for input(s): LIPASE, AMYLASE in the last 168 hours. No results for input(s): AMMONIA in the last 168 hours. Coagulation Profile: No results for input(s): INR, PROTIME in the last 168 hours. Cardiac Enzymes: No results for input(s): CKTOTAL, CKMB, CKMBINDEX, TROPONINI in the last 168 hours. BNP (last 3 results) No results for input(s): PROBNP in the last 8760 hours. HbA1C: No results for input(s): HGBA1C in the last 72 hours. CBG: Recent Labs  Lab 01/31/20 1656 01/31/20 2114 02/01/20 0730 02/01/20 1127 02/01/20 1647  GLUCAP 98 97 90 119* 100*   Lipid Profile: No results for input(s): CHOL, HDL, LDLCALC, TRIG, CHOLHDL, LDLDIRECT in the last 72 hours. Thyroid Function Tests: No results for input(s): TSH,  T4TOTAL, FREET4, T3FREE, THYROIDAB in the last 72 hours. Anemia Panel: No results for input(s): VITAMINB12, FOLATE, FERRITIN, TIBC, IRON, RETICCTPCT in the last 72 hours. Sepsis Labs: No results for input(s): PROCALCITON, LATICACIDVEN in the last 168 hours.  Recent Results (from the past 240 hour(s))  Urine culture     Status: Abnormal   Collection Time: 01/25/20 11:19 AM   Specimen: Urine, Random  Result Value Ref Range Status   Specimen Description URINE, RANDOM  Final   Special Requests   Final    NONE Performed at [facility], [address] [place], [place] [address]    Culture 70,000 COLONIES/mL ESCHERICHIA COLI (A)  Final   Report Status 01/27/2020 FINAL  Final   Organism ID, Bacteria ESCHERICHIA COLI (A)  Final      Susceptibility   Escherichia coli - MIC*    AMPICILLIN >=32 RESISTANT Resistant     CEFAZOLIN <=4 SENSITIVE Sensitive     CEFTRIAXONE <=0.25 SENSITIVE Sensitive     CIPROFLOXACIN <=0.25 SENSITIVE Sensitive     GENTAMICIN >=16 RESISTANT  Resistant     IMIPENEM <=0.25 SENSITIVE Sensitive     NITROFURANTOIN <=16 SENSITIVE Sensitive     TRIMETH/SULFA >=320 RESISTANT Resistant     AMPICILLIN/SULBACTAM >=32 RESISTANT Resistant     PIP/TAZO <=4 SENSITIVE Sensitive     * 70,000 COLONIES/mL ESCHERICHIA COLI  SARS CORONAVIRUS 2 (TAT 6-24 HRS) Nasopharyngeal Nasopharyngeal Swab     Status: None   Collection Time: 01/25/20 11:25 AM   Specimen: Nasopharyngeal Swab  Result Value Ref Range Status   SARS Coronavirus 2 NEGATIVE NEGATIVE Final    Comment: (NOTE) SARS-CoV-2 target nucleic acids are NOT DETECTED. The SARS-CoV-2 RNA is generally detectable in upper and lower respiratory specimens during the acute phase of infection. Negative results do not preclude SARS-CoV-2 infection, do not rule out co-infections with other pathogens, and should not be used as the sole basis for treatment or other patient management decisions. Negative results must be combined with clinical observations, patient history, and epidemiological information. The expected result is Negative. Fact Sheet for Patients: SugarRoll.be Fact Sheet for Healthcare Providers: https://www.woods-mathews.com/ This test is not yet approved or cleared by the [place] FDA and  has been authorized for detection and/or diagnosis of SARS-CoV-2 by FDA under an Emergency Use Authorization (EUA). This EUA will remain  in effect (meaning this test can be used) for the duration of the COVID-19 declaration under Section 56 4(b)(1) of the Act, 21 U.S.C. section 360bbb-3(b)(1), unless the authorization is terminated or revoked sooner. Performed at [facility], [place] [address], [place], [place] [address]   MRSA PCR Screening     Status: None   Collection Time: 01/26/20  9:32 AM   Specimen: Nasal Mucosa; Nasopharyngeal  Result Value Ref Range Status   MRSA by PCR NEGATIVE NEGATIVE Final    Comment:        The GeneXpert MRSA  Assay (FDA approved for NASAL specimens only), is one component of a comprehensive MRSA colonization surveillance program. It is not intended to diagnose MRSA infection nor to guide or monitor treatment for MRSA infections. Performed at [facility], [place] [address], [place], [place] [address]   Aerobic/Anaerobic Culture (surgical/deep wound)     Status: None   Collection Time: 01/26/20  1:16 PM   Specimen: Other Source; Body Fluid  Result Value Ref Range Status   Specimen Description WOUND ABDOMEN FLUID  Final   Special Requests PATIENT  ON FOLLOWING ZOSYN  Final   Gram Stain   Final    MODERATE WBC PRESENT,BOTH PMN AND MONONUCLEAR NO ORGANISMS SEEN    Culture   Final    RARE ESCHERICHIA COLI RARE CLOSTRIDIUM TERTIUM WITHIN MIXED ANAEROBIC ORGANISMS Performed at [facility], [place] [address] [place], [place] [address]    Report Status 01/29/2020 FINAL  Final   Organism ID, Bacteria ESCHERICHIA COLI  Final      Susceptibility   Escherichia coli - MIC*    AMPICILLIN >=32 RESISTANT Resistant     CEFAZOLIN <=4 SENSITIVE Sensitive     CEFEPIME <=0.12 SENSITIVE Sensitive     CEFTAZIDIME <=1 SENSITIVE Sensitive     CEFTRIAXONE <=0.25 SENSITIVE Sensitive     CIPROFLOXACIN <=0.25 SENSITIVE Sensitive     GENTAMICIN >=16 RESISTANT Resistant     IMIPENEM <=0.25 SENSITIVE Sensitive     TRIMETH/SULFA >=320 RESISTANT Resistant     AMPICILLIN/SULBACTAM >=32 RESISTANT Resistant     PIP/TAZO <=4 SENSITIVE Sensitive     * RARE ESCHERICHIA COLI         Radiology Studies: No results found.      Scheduled Meds: . enoxaparin (LOVENOX) injection  40 mg Subcutaneous Q24H  . feeding supplement  1 Container Oral TID BM  . insulin aspart  0-15 Units Subcutaneous TID WC  . lip balm  1 application Topical BID  . pantoprazole (PROTONIX) IV  40 mg Intravenous Q24H   Continuous Infusions: . methocarbamol (ROBAXIN) IV 1,000 mg (02/01/20 1738)     LOS: 7 days    Time  spent: 35 minutes    Elie Confer, MD [facility] Pager [phone]   If 7PM-7AM, please contact night-coverage [email or web address] Password [facility] 02/01/2020, 7:28 PM

## 2020-02-01 NOTE — Progress Notes (Signed)
[name] [id number] 05/02/1956  CARE TEAM:  PCP: [name], MD  Outpatient Care Team: Patient Care Team: [name], MD as PCP - General (Internal Medicine) Cira Rue, RN Nurse Navigator as Registered Nurse (Medical Oncology) Tyler Pita, MD as Consulting Physician (Radiation Oncology) Wyatt Portela, MD as Consulting Physician (Oncology)  Inpatient Treatment Team: Treatment Team: Attending Provider: Elie Confer, MD; Registered Nurse: Dagoberto Ligas, RN; Consulting Physician: Nolon Nations, MD; Respiratory Therapist: Hansel Feinstein, RRT; Rounding Team: Dillard Essex, MD; [place] Nurse: Nadara Mode, RN; Technician: Gasper Sells, [place]; Case Manager: Cristina Gong, RN; Nurse Practitioner: Philis Pique, NP; Registered Nurse: Westly Pam, RN; Technician: Adele Barthel, NT; Technician: Raenette Rover, [place]; Consulting Physician: Tyler Pita, MD   Problem List:   Principal Problem:   Bowel perforation (HCC) Active Problems:   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[place])   UTI (urinary tract infection)   Pre-diabetes   Prostate cancer ([place])   Diverticulitis of large intestine with perforation   SBO (small bowel obstruction) (HCC)   Sepsis (HCC)   Neutropenia with fever ([place])   Colovesical fistula   Palliative care by specialist   Acute blood loss anemia   Hyponatremia   Protein-calorie malnutrition, moderate (HCC)   6 Days Post-Op  01/26/2020   POST-OPERATIVE DIAGNOSIS:  Perforated cecum, carcinomatosis, sigmoid with dense adhesion to pelvic side wall   PROCEDURE:  Procedure(s): Exploratory laparotomy, right hemicolectomy with end ileostomy, mucus fistula of transverse colon  SURGEON:  Surgeon(s): [name], MD   Assessment  Slowly recovering  [facility] Stay = 7 days)  Plan:  Hx of prostate cancer s/p XRT  LBO with cecal perforation Carcinomatosis S/p exploratory laparotomy, right hemicolectomy, end ileostomy,  mucus fistula of transverse colon - 01/26/20 Dr. Barry Dienes  - surgical path pending  -Retry NG tube clamping trial with liquids.  If tolerates with low residuals, remove NG tube and advance to full liquid/dysphagia 1 diet tomorrow.  Patient very nervous about having NG tube removed since was difficult to get placed -wound much more clean.  Switched to wound VAC  -mobilize.  Get him up -Continue therapies, recommending CIR -  ABL anemia- expected post-op, hgblow but stable, follow CBC   [id number] of clears, IVF, NGT clamped [id number], lovenox ID:IV zosyn 2/6>2/10; Ancef 2/10-2/13 stop & follow Foley:removed 2/9 Follow up: Dr. Barry Dienes  LOS: 6 days     35 minutes spent in review, evaluation, examination, counseling, and coordination of care.  More than 50% of that time was spent in counseling.  I updated the patient's status to the patient and nurse.  Recommendations were made.  Questions were answered.  They expressed understanding & appreciation.   02/01/2020    Subjective: (Chief complaint)  Patient desired NG tube back to suction at bedtime.  Staying in bed.  Nursing just outside room.  Objective:  Vital signs:  Vitals:   01/31/20 0538 01/31/20 1535 01/31/20 2117 02/01/20 0516  BP: 138/79 (!) 149/82 139/79 (!) 145/83  Pulse: 70 77 77 70  Resp: 16 16 16 16   Temp: 98.2 F (36.8 C) 99.7 F (37.6 C) 99.9 F (37.7 C) 98.2 F (36.8 C)  TempSrc: Oral Oral Oral Oral  SpO2: 100% 100% 98% 100%  Weight:      Height:        Last BM Date: 01/28/20(colostomy)  Intake/Output   Yesterday:  02/12 0701 - 02/13 0700 In: [id number] [P.O.:120; I.V.:1350; IV Piggyback:200] Out: 1925 [Urine:950;  Emesis/NG output:550; Stool:425] This shift:  No intake/output data recorded.  Bowel function:  Flatus: YES -ileostomy with moderate flatus  BM:  YES  Drain: (No drain)   Physical Exam:  General: Pt awake/alert in no acute distress Eyes: PERRL, normal EOM.  Sclera clear.   No icterus Neuro: CN II-XII intact w/o focal sensory/motor deficits. Lymph: No head/neck/groin lymphadenopathy Psych:  No delerium/psychosis/paranoia.  Oriented x 4.  Mildly anxious but consolable HENT: Normocephalic, Mucus membranes moist.  No thrush Neck: Supple, No tracheal deviation.  No obvious thyromegaly Chest: No pain to chest wall compression.  Good respiratory excursion.  No audible wheezing CV:  Pulses intact.  Regular rhythm.  No major extremity edema MS: Normal AROM mjr joints.  No obvious deformity  Abdomen: Soft.  Mildy distended.  Mildly tender at incisions only.  Ileostomy pink with gas and thin succus in bag.  Mucous fistula viable with no gas or stool.  Midline wound with good granulation no evidence of peritonitis.  No incarcerated hernias.  Ext:   No deformity.  No mjr edema.  No cyanosis Skin: No petechiae / purpurea.  No major sores.  Warm and dry    Results:   Cultures: Recent Results (from the past 720 hour(s))  Urine culture     Status: Abnormal   Collection Time: 01/25/20 11:19 AM   Specimen: Urine, Random  Result Value Ref Range Status   Specimen Description URINE, RANDOM  Final   Special Requests   Final    NONE Performed at [facility], [address] [place], [place] [address]    Culture 70,000 COLONIES/mL ESCHERICHIA COLI (A)  Final   Report Status 01/27/2020 FINAL  Final   Organism ID, Bacteria ESCHERICHIA COLI (A)  Final      Susceptibility   Escherichia coli - MIC*    AMPICILLIN >=32 RESISTANT Resistant     CEFAZOLIN <=4 SENSITIVE Sensitive     CEFTRIAXONE <=0.25 SENSITIVE Sensitive     CIPROFLOXACIN <=0.25 SENSITIVE Sensitive     GENTAMICIN >=16 RESISTANT Resistant     IMIPENEM <=0.25 SENSITIVE Sensitive     NITROFURANTOIN <=16 SENSITIVE Sensitive     TRIMETH/SULFA >=320 RESISTANT Resistant     AMPICILLIN/SULBACTAM >=32 RESISTANT Resistant     PIP/TAZO <=4 SENSITIVE Sensitive     * 70,000 COLONIES/mL ESCHERICHIA COLI  SARS  CORONAVIRUS 2 (TAT 6-24 HRS) Nasopharyngeal Nasopharyngeal Swab     Status: None   Collection Time: 01/25/20 11:25 AM   Specimen: Nasopharyngeal Swab  Result Value Ref Range Status   SARS Coronavirus 2 NEGATIVE NEGATIVE Final    Comment: (NOTE) SARS-CoV-2 target nucleic acids are NOT DETECTED. The SARS-CoV-2 RNA is generally detectable in upper and lower respiratory specimens during the acute phase of infection. Negative results do not preclude SARS-CoV-2 infection, do not rule out co-infections with other pathogens, and should not be used as the sole basis for treatment or other patient management decisions. Negative results must be combined with clinical observations, patient history, and epidemiological information. The expected result is Negative. Fact Sheet for Patients: SugarRoll.be Fact Sheet for Healthcare Providers: https://www.woods-mathews.com/ This test is not yet approved or cleared by the [place] FDA and  has been authorized for detection and/or diagnosis of SARS-CoV-2 by FDA under an Emergency Use Authorization (EUA). This EUA will remain  in effect (meaning this test can be used) for the duration of the COVID-19 declaration under Section 56 4(b)(1) of the Act, 21 U.S.C. section 360bbb-3(b)(1), unless the  authorization is terminated or revoked sooner. Performed at [facility], [place] [address], [place], [place] [address]   MRSA PCR Screening     Status: None   Collection Time: 01/26/20  9:32 AM   Specimen: Nasal Mucosa; Nasopharyngeal  Result Value Ref Range Status   MRSA by PCR NEGATIVE NEGATIVE Final    Comment:        The GeneXpert MRSA Assay (FDA approved for NASAL specimens only), is one component of a comprehensive MRSA colonization surveillance program. It is not intended to diagnose MRSA infection nor to guide or monitor treatment for MRSA infections. Performed at [facility], [place] [address], [place], [place] [address]   Aerobic/Anaerobic Culture (surgical/deep wound)     Status: None   Collection Time: 01/26/20  1:16 PM   Specimen: Other Source; Body Fluid  Result Value Ref Range Status   Specimen Description WOUND ABDOMEN FLUID  Final   Special Requests PATIENT ON FOLLOWING ZOSYN  Final   Gram Stain   Final    MODERATE WBC PRESENT,BOTH PMN AND MONONUCLEAR NO ORGANISMS SEEN    Culture   Final    RARE ESCHERICHIA COLI RARE CLOSTRIDIUM TERTIUM WITHIN MIXED ANAEROBIC ORGANISMS Performed at [facility], [place] [address], [place], [place] [address]    Report Status 01/29/2020 FINAL  Final   Organism ID, Bacteria ESCHERICHIA COLI  Final      Susceptibility   Escherichia coli - MIC*    AMPICILLIN >=32 RESISTANT Resistant     CEFAZOLIN <=4 SENSITIVE Sensitive     CEFEPIME <=0.12 SENSITIVE Sensitive     CEFTAZIDIME <=1 SENSITIVE Sensitive     CEFTRIAXONE <=0.25 SENSITIVE Sensitive     CIPROFLOXACIN <=0.25 SENSITIVE Sensitive     GENTAMICIN >=16 RESISTANT Resistant     IMIPENEM <=0.25 SENSITIVE Sensitive     TRIMETH/SULFA >=320 RESISTANT Resistant     AMPICILLIN/SULBACTAM >=32 RESISTANT Resistant     PIP/TAZO <=4 SENSITIVE Sensitive     * RARE ESCHERICHIA COLI    Labs: Results for orders placed or performed during the hospital encounter of 01/25/20 (from the past 48 hour(s))  Glucose, capillary     Status: None   Collection Time: 01/30/20  8:04 AM  Result Value Ref Range   Glucose-Capillary 81 70 - 99 mg/dL  Glucose, capillary     Status: None   Collection Time: 01/30/20 12:07 PM  Result Value Ref Range   Glucose-Capillary 84 70 - 99 mg/dL  Glucose, capillary     Status: None   Collection Time: 01/30/20  4:30 PM  Result Value Ref Range   Glucose-Capillary 83 70 - 99 mg/dL  Glucose, capillary     Status: None   Collection Time: 01/30/20  7:58 PM  Result Value Ref Range   Glucose-Capillary 70 70 - 99 mg/dL  CBC with Differential/Platelet     Status: Abnormal    Collection Time: 01/31/20  4:11 AM  Result Value Ref Range   WBC 4.1 4.0 - 10.5 K/uL   RBC 2.69 (L) 4.22 - 5.81 MIL/uL   Hemoglobin 8.0 (L) 13.0 - 17.0 g/dL   HCT 24.8 (L) 39.0 - 52.0 %   MCV 92.2 80.0 - 100.0 fL   MCH 29.7 26.0 - 34.0 pg   MCHC 32.3 30.0 - 36.0 g/dL   RDW 13.6 11.5 - 15.5 %   Platelets 307 150 - 400 K/uL   nRBC 0.0 0.0 - 0.2 %   Neutrophils Relative %  70 %   Neutro Abs 2.9 1.7 - 7.7 K/uL   Lymphocytes Relative 9 %   Lymphs Abs 0.4 (L) 0.7 - 4.0 K/uL   Monocytes Relative 15 %   Monocytes Absolute 0.6 0.1 - 1.0 K/uL   Eosinophils Relative 1 %   Eosinophils Absolute 0.0 0.0 - 0.5 K/uL   Basophils Relative 1 %   Basophils Absolute 0.0 0.0 - 0.1 K/uL   Immature Granulocytes 4 %   Abs Immature Granulocytes 0.16 (H) 0.00 - 0.07 K/uL    Comment: Performed at [facility] [address], [place], [place] [id number]  Basic metabolic panel     Status: Abnormal   Collection Time: 01/31/20  4:11 AM  Result Value Ref Range   Sodium 137 135 - 145 mmol/L   Potassium 3.5 3.5 - 5.1 mmol/L   Chloride 103 98 - 111 mmol/L   CO2 25 22 - 32 mmol/L   Glucose, Bld 96 70 - 99 mg/dL   BUN 12 8 - 23 mg/dL   Creatinine, Ser 0.75 0.61 - 1.24 mg/dL   Calcium 8.7 (L) 8.9 - 10.3 mg/dL   GFR calc non Af Amer >60 >60 mL/min   GFR calc Af Amer >60 >60 mL/min   Anion gap 9 5 - 15    Comment: Performed at [facility] [address] [place], [place] [address]  Glucose, capillary     Status: None   Collection Time: 01/31/20  7:41 AM  Result Value Ref Range   Glucose-Capillary 99 70 - 99 mg/dL  Glucose, capillary     Status: None   Collection Time: 01/31/20 11:28 AM  Result Value Ref Range   Glucose-Capillary 96 70 - 99 mg/dL  CBC     Status: Abnormal   Collection Time: 01/31/20  1:03 PM  Result Value Ref Range   WBC 5.8 4.0 - 10.5 K/uL   RBC 3.04 (L) 4.22 - 5.81 MIL/uL   Hemoglobin 9.2 (L) 13.0 - 17.0 g/dL   HCT 27.6 (L) 39.0 - 52.0 %   MCV 90.8 80.0 - 100.0 fL    MCH 30.3 26.0 - 34.0 pg   MCHC 33.3 30.0 - 36.0 g/dL   RDW 13.8 11.5 - 15.5 %   Platelets 341 150 - 400 K/uL   nRBC 0.0 0.0 - 0.2 %    Comment: Performed at [facility], [place] [address], [place], [place] [address]  Glucose, capillary     Status: None   Collection Time: 01/31/20  4:56 PM  Result Value Ref Range   Glucose-Capillary 98 70 - 99 mg/dL  Glucose, capillary     Status: None   Collection Time: 01/31/20  9:14 PM  Result Value Ref Range   Glucose-Capillary 97 70 - 99 mg/dL  CBC     Status: Abnormal   Collection Time: 02/01/20  5:37 AM  Result Value Ref Range   WBC 5.7 4.0 - 10.5 K/uL   RBC 2.80 (L) 4.22 - 5.81 MIL/uL   Hemoglobin 8.2 (L) 13.0 - 17.0 g/dL   HCT 26.0 (L) 39.0 - 52.0 %   MCV 92.9 80.0 - 100.0 fL   MCH 29.3 26.0 - 34.0 pg   MCHC 31.5 30.0 - 36.0 g/dL   RDW 13.9 11.5 - 15.5 %   Platelets 336 150 - 400 K/uL   nRBC 0.0 0.0 - 0.2 %    Comment: Performed at [facility], [place],  [place] [id number]  Basic metabolic panel     Status: Abnormal   Collection Time: 02/01/20  5:37 AM  Result Value Ref Range   Sodium 136 135 - 145 mmol/L   Potassium 3.9 3.5 - 5.1 mmol/L   Chloride 104 98 - 111 mmol/L   CO2 23 22 - 32 mmol/L   Glucose, Bld 91 70 - 99 mg/dL   BUN 9 8 - 23 mg/dL   Creatinine, Ser 0.73 0.61 - 1.24 mg/dL   Calcium 8.7 (L) 8.9 - 10.3 mg/dL   GFR calc non Af Amer >60 >60 mL/min   GFR calc Af Amer >60 >60 mL/min   Anion gap 9 5 - 15    Comment: Performed at [facility], [place] [address], [place], [place] [address]  Glucose, capillary     Status: None   Collection Time: 02/01/20  7:30 AM  Result Value Ref Range   Glucose-Capillary 90 70 - 99 mg/dL    Imaging / Studies: No results found.  Medications / Allergies: per chart  Antibiotics: Anti-infectives (From admission, onward)   Start     Dose/Rate Route Frequency Ordered Stop   01/29/20 1400  ceFAZolin (ANCEF) IVPB 2g/100 mL premix     2 g 200 mL/hr over 30  Minutes Intravenous Every 8 hours 01/29/20 0951     01/26/20 1600  piperacillin-tazobactam (ZOSYN) IVPB 3.375 g  Status:  Discontinued     3.375 g 12.5 mL/hr over 240 Minutes Intravenous Every 8 hours 01/26/20 1532 01/29/20 0950   01/25/20 1330  piperacillin-tazobactam (ZOSYN) IVPB 3.375 g  Status:  Discontinued     3.375 g 12.5 mL/hr over 240 Minutes Intravenous Every 6 hours 01/25/20 1320 01/26/20 1532   01/25/20 1130  piperacillin-tazobactam (ZOSYN) IVPB 3.375 g     3.375 g 100 mL/hr over 30 Minutes Intravenous  Once 01/25/20 1118 01/25/20 1225        Note: Portions of this report may have been transcribed using voice recognition software. Every effort was made to ensure accuracy; however, inadvertent computerized transcription errors may be present.   Any transcriptional errors that result from this process are unintentional.     Juan Hector, MD, FACS, MASCRS Gastrointestinal and Minimally Invasive Surgery    [address] [place], [place] [address] [phone] Main / Paging [phone] Fax Please see Amion for pager number, especial 5pm - 7am.

## 2020-02-02 LAB — COMPREHENSIVE METABOLIC PANEL
ALT: 34 U/L (ref 0–44)
AST: 37 U/L (ref 15–41)
Albumin: 2 g/dL — ABNORMAL LOW (ref 3.5–5.0)
Alkaline Phosphatase: 82 U/L (ref 38–126)
Anion gap: 9 (ref 5–15)
BUN: 7 mg/dL — ABNORMAL LOW (ref 8–23)
CO2: 25 mmol/L (ref 22–32)
Calcium: 8.9 mg/dL (ref 8.9–10.3)
Chloride: 101 mmol/L (ref 98–111)
Creatinine, Ser: 0.62 mg/dL (ref 0.61–1.24)
GFR calc Af Amer: >60 mL/min (ref >60)
GFR calc non Af Amer: >60 mL/min (ref >60)
Glucose, Bld: 101 mg/dL — ABNORMAL HIGH (ref 70–99)
Potassium: 3.8 mmol/L (ref 3.5–5.1)
Sodium: 135 mmol/L (ref 135–145)
Total Bilirubin: 0.7 mg/dL (ref 0.3–1.2)
Total Protein: 6.2 g/dL — ABNORMAL LOW (ref 6.5–8.1)

## 2020-02-02 LAB — MAGNESIUM: Magnesium: 1.7 mg/dL (ref 1.7–2.4)

## 2020-02-02 LAB — C-REACTIVE PROTEIN: CRP: 19.6 mg/dL — ABNORMAL HIGH (ref <1.0)

## 2020-02-02 LAB — GLUCOSE, CAPILLARY
Glucose-Capillary: 102 mg/dL — ABNORMAL HIGH (ref 70–99)
Glucose-Capillary: 102 mg/dL — ABNORMAL HIGH (ref 70–99)
Glucose-Capillary: 103 mg/dL — ABNORMAL HIGH (ref 70–99)
Glucose-Capillary: 123 mg/dL — ABNORMAL HIGH (ref 70–99)

## 2020-02-02 LAB — CBC
HCT: 26.4 % — ABNORMAL LOW (ref 39.0–52.0)
Hemoglobin: 8.4 g/dL — ABNORMAL LOW (ref 13.0–17.0)
MCH: 29.6 pg (ref 26.0–34.0)
MCHC: 31.8 g/dL (ref 30.0–36.0)
MCV: 93 fL (ref 80.0–100.0)
Platelets: 390 10*3/uL (ref 150–400)
RBC: 2.84 MIL/uL — ABNORMAL LOW (ref 4.22–5.81)
RDW: 13.8 % (ref 11.5–15.5)
WBC: 5.2 10*3/uL (ref 4.0–10.5)
nRBC: 0 % (ref 0.0–0.2)

## 2020-02-02 LAB — PHOSPHORUS: Phosphorus: 3.1 mg/dL (ref 2.5–4.6)

## 2020-02-02 NOTE — Progress Notes (Signed)
PROGRESS NOTE    Juan Richards  HQI:[id number] DOB: August 18, 1956 DOA: 01/25/2020  PCP: [name], MD   Brief Narrative:  Patient is a 64 year old male with a past medical history of prostate cancer status post radiation and prostatectomy who presented with increasing abdominal pain on 01/25/2020. His GI team sent him in due to abdominal distention and loss of appetite. Abdominal x-ray showed findings consistent with small bowel obstruction. Patient met criteria for sepsis on 2/7 with persistent fever, tachycardia and drop of white blood cell count to 1.3. Repeat abdominal x-ray showed air underneath the diaphragm concerning for perforation he was taken to the operating room on an emergent basis. He was found to have a perforated cecum in addition to small nodules over the segments of the mesentery and bowel concerning for malignancy. The sigmoid colon was adhered to the bladder and wall of the pelvis making unclear if there was a mass. Patient was progressing well postoperatively.  On 2/10, due to nausea/vomting, abdominal x-ray was obtained . Suggestive of ileus, NGT placed again. CIR has been recommendedfor both rehab and physician oversight of postoperative management.  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  Large bowel obstruction/cecal perforation status post ex lap with right hemicolectomy and end ileostomy 2.  Urinary tract infection 3.  Sepsis, present on admission 4.  Anemia due to acute blood loss 5.  Abdominal  carcinomatosis 6.  Protein calorie malnutrition, moderate  1.  Large bowel obstruction/cecal perforation, status post ex lap with right hemicolectomy and end ileostomy by general surgery.  Patient developed postop ileus.  Status post NG tube placement which was discontinued and patient tolerating full liquid diet.  Surgery is managing.  2.  Urinary tract infection patient has been on Ancef which was completed on  02/01/2020. Currently without any symptoms.   Continue to monitor  3.  Sepsis, present on admission, secondary to large bowel perforation.  Resolved.  4.  Anemia due to acute blood loss.  Hemoglobin staying steady at 8.4 today Continue to monitor CBC and transfuse if hemoglobin is less than 7.0  5.  Abdominal carcinomatosis. Awaiting for surgical pathology report to determine site.  Patient has a history of prostate cancer status post radiation therapy and prostatectomy.  6.  Moderate protein calorie malnutrition Nutrition on board Continue with dietary supplementation    DVT prophylaxis: Lovenox subacute Code Status: Full code  Family Communication: None at bedside  Disposition Plan: Patient is from home and may likely be discharged to inpatient rehab. There is no barrier to discharge to inpatient rehab.  .  Consultants:   General surgery  Procedures:  01/26/2020 exploratory laparotomy, right hemicolectomy and end ileostomy, mucous fistula of the transverse colon    Antimicrobials: 01/25/2020 Zosyn 01/29/2020 cefazolin   Subjective: Patient was seen and evaluated at bedside.  NG tube has been removed and patient currently tolerating full liquid diet.   Sitting up in bed and not in acute distress.   Wound VAC was placed. No acute overnight event was  reported.   Objective: Vitals:   02/01/20 0516 02/01/20 1545 02/01/20 2121 02/02/20 0513  BP: (!) 145/83 (!) 144/90 (!) 153/89 139/84  Pulse: 70 73 75 76  Resp: '16 20 17 15  '$ Temp: 98.2 F (36.8 C) 98.5 F (36.9  C) 100.1 F (37.8 C) 99.4 F (37.4 C)  TempSrc: Oral Oral Oral Oral  SpO2: 100% 100% 99% 99%  Weight:      Height:        Intake/Output Summary (Last 24 hours) at 02/02/2020 1341 Last data filed at 02/02/2020 0845 Gross per 24 hour  Intake -  Output 995 ml  Net -995 ml   Filed Weights   01/25/20 1327  Weight: 87.2 kg    Examination:  General exam: Appears calm and comfortable. Respiratory system: Clear to auscultation. Respiratory effort normal. Cardiovascular system: S1 & S2 heard, RRR. No JVD, murmurs, rubs, gallops or clicks. No pedal edema. Gastrointestinal system: Status post exploratory lap abdomen. Wound dressing clean and dry with wound VAC in place.  Bowel sounds were present  Central nervous system: Alert and oriented. No focal neurological deficits. Extremities: Symmetric 5 x 5 power. Skin: No rashes, lesions or ulcers Psychiatry: Judgement and insight appear normal. Mood & affect appropriate.     Data Reviewed: I have personally reviewed following labs and imaging studies  CBC: Recent Labs  Lab 01/27/20 0242 01/27/20 0242 01/28/20 0154 01/28/20 0154 01/29/20 0133 01/29/20 0133 01/30/20 0230 01/31/20 0411 01/31/20 1303 02/01/20 0537 02/02/20 0255  WBC 4.6   < > 3.0*   < > 3.6*   < > 2.4* 4.1 5.8 5.7 5.2  NEUTROABS 4.1  --  2.5  --  2.9  --  1.7 2.9  --   --   --   HGB 10.7*   < > 8.5*   < > 9.0*   < > 9.1* 8.0* 9.2* 8.2* 8.4*  HCT 32.6*   < > 26.2*   < > 27.5*   < > 27.5* 24.8* 27.6* 26.0* 26.4*  MCV 91.8   < > 93.2   < > 91.7   < > 91.4 92.2 90.8 92.9 93.0  PLT 286   < > 253   < > 287   < > 285 307 341 336 390   < > = values in this interval not displayed.   Basic Metabolic Panel: Recent Labs  Lab 01/29/20 0133 01/30/20 0230 01/31/20 0411 02/01/20 0537 02/02/20 0255  NA 133* 137 137 136 135  K 3.7 3.7 3.5 3.9 3.8  CL 98 96* 103 104 101  CO2 '27 28 25 23 25  '$ GLUCOSE 101* 90 96 91 101*  BUN '10 15 12 9 '$ 7*  CREATININE 0.76 0.80 0.75 0.73  0.62  CALCIUM 8.5* 9.0 8.7* 8.7* 8.9  MG  --   --   --   --  1.7  PHOS  --   --   --   --  3.1   GFR: Estimated Creatinine Clearance: 102 mL/min (by C-G formula based on SCr of 0.62 mg/dL). Liver Function Tests: Recent Labs  Lab 02/02/20 0255  AST 37  ALT 34  ALKPHOS 82  BILITOT 0.7  PROT 6.2*  ALBUMIN 2.0*   No results for input(s): LIPASE, AMYLASE in the last 168 hours. No results for input(s): AMMONIA in the last 168 hours. Coagulation Profile: No results for input(s): INR, PROTIME in the last 168 hours. Cardiac Enzymes: No results for input(s): CKTOTAL, CKMB, CKMBINDEX, TROPONINI in  the last 168 hours. BNP (last 3 results) No results for input(s): PROBNP in the last 8760 hours. HbA1C: No results for input(s): HGBA1C in the last 72 hours. CBG: Recent Labs  Lab 02/01/20 1127 02/01/20 1647 02/01/20 2117 02/02/20 0746 02/02/20 1249  GLUCAP 119* 100* 93 102* 102*   Lipid Profile: No results for input(s): CHOL, HDL, LDLCALC, TRIG, CHOLHDL, LDLDIRECT in the last 72 hours. Thyroid Function Tests: No results for input(s): TSH, T4TOTAL, FREET4, T3FREE, THYROIDAB in the last 72 hours. Anemia Panel: No results for input(s): VITAMINB12, FOLATE, FERRITIN, TIBC, IRON, RETICCTPCT in the last 72 hours. Sepsis Labs: No results for input(s): PROCALCITON, LATICACIDVEN in the last 168 hours.  Recent Results (from the past 240 hour(s))  Urine culture     Status: Abnormal   Collection Time: 01/25/20 11:19 AM   Specimen: Urine, Random  Result Value Ref Range Status   Specimen Description URINE, RANDOM  Final   Special Requests   Final    NONE Performed at [facility], [address], [place], [place] [address]    Culture 70,000 COLONIES/mL ESCHERICHIA COLI (A)  Final   Report Status 01/27/2020 FINAL  Final   Organism ID, Bacteria ESCHERICHIA COLI (A)  Final      Susceptibility   Escherichia coli - MIC*    AMPICILLIN >=32 RESISTANT Resistant     CEFAZOLIN <=4  SENSITIVE Sensitive     CEFTRIAXONE <=0.25 SENSITIVE Sensitive     CIPROFLOXACIN <=0.25 SENSITIVE Sensitive     GENTAMICIN >=16 RESISTANT Resistant     IMIPENEM <=0.25 SENSITIVE Sensitive     NITROFURANTOIN <=16 SENSITIVE Sensitive     TRIMETH/SULFA >=320 RESISTANT Resistant     AMPICILLIN/SULBACTAM >=32 RESISTANT Resistant     PIP/TAZO <=4 SENSITIVE Sensitive     * 70,000 COLONIES/mL ESCHERICHIA COLI  SARS CORONAVIRUS 2 (TAT 6-24 HRS) Nasopharyngeal Nasopharyngeal Swab     Status: None   Collection Time: 01/25/20 11:25 AM   Specimen: Nasopharyngeal Swab  Result Value Ref Range Status   SARS Coronavirus 2 NEGATIVE NEGATIVE Final    Comment: (NOTE) SARS-CoV-2 target nucleic acids are NOT DETECTED. The SARS-CoV-2 RNA is generally detectable in upper and lower respiratory specimens during the acute phase of infection. Negative results do not preclude SARS-CoV-2 infection, do not rule out co-infections with other pathogens, and should not be used as the sole basis for treatment or other patient management decisions. Negative results must be combined with clinical observations, patient history, and epidemiological information. The expected result is Negative. Fact Sheet for Patients: SugarRoll.be Fact Sheet for Healthcare Providers: https://www.woods-mathews.com/ This test is not yet approved or cleared by the [place] FDA and  has been authorized for detection and/or diagnosis of SARS-CoV-2 by FDA under an Emergency Use Authorization (EUA). This EUA will remain  in effect (meaning this test can be used) for the duration of the COVID-19 declaration under Section 56 4(b)(1) of the Act, 21 U.S.C. section 360bbb-3(b)(1), unless the authorization is terminated or revoked sooner. Performed at [facility], [place] [address], [place], [place] [address]   MRSA PCR Screening     Status: None   Collection Time: 01/26/20  9:32 AM   Specimen:  Nasal Mucosa; Nasopharyngeal  Result Value Ref Range Status   MRSA by PCR NEGATIVE NEGATIVE Final    Comment:        The GeneXpert MRSA Assay (FDA approved for NASAL specimens only), is one component of a comprehensive MRSA colonization surveillance  program. It is not intended to diagnose MRSA infection nor to guide or monitor treatment for MRSA infections. Performed at [facility], [place] [address] [place], [place] [address]   Aerobic/Anaerobic Culture (surgical/deep wound)     Status: None   Collection Time: 01/26/20  1:16 PM   Specimen: Other Source; Body Fluid  Result Value Ref Range Status   Specimen Description WOUND ABDOMEN FLUID  Final   Special Requests PATIENT ON FOLLOWING ZOSYN  Final   Gram Stain   Final    MODERATE WBC PRESENT,BOTH PMN AND MONONUCLEAR NO ORGANISMS SEEN    Culture   Final    RARE ESCHERICHIA COLI RARE CLOSTRIDIUM TERTIUM WITHIN MIXED ANAEROBIC ORGANISMS Performed at [facility], [place] [address] [place], [place] [address]    Report Status 01/29/2020 FINAL  Final   Organism ID, Bacteria ESCHERICHIA COLI  Final      Susceptibility   Escherichia coli - MIC*    AMPICILLIN >=32 RESISTANT Resistant     CEFAZOLIN <=4 SENSITIVE Sensitive     CEFEPIME <=0.12 SENSITIVE Sensitive     CEFTAZIDIME <=1 SENSITIVE Sensitive     CEFTRIAXONE <=0.25 SENSITIVE Sensitive     CIPROFLOXACIN <=0.25 SENSITIVE Sensitive     GENTAMICIN >=16 RESISTANT Resistant     IMIPENEM <=0.25 SENSITIVE Sensitive     TRIMETH/SULFA >=320 RESISTANT Resistant     AMPICILLIN/SULBACTAM >=32 RESISTANT Resistant     PIP/TAZO <=4 SENSITIVE Sensitive     * RARE ESCHERICHIA COLI         Radiology Studies: No results found.      Scheduled Meds: . enoxaparin (LOVENOX) injection  40 mg Subcutaneous Q24H  . feeding supplement  1 Container Oral TID BM  . insulin aspart  0-15 Units Subcutaneous TID WC  . lip balm  1 application Topical BID  . pantoprazole (PROTONIX)  IV  40 mg Intravenous Q24H   Continuous Infusions: . methocarbamol (ROBAXIN) IV 1,000 mg (02/02/20 1253)     LOS: 8 days    Time spent: 35 minutes    Elie Confer, MD [facility] Pager [phone]   If 7PM-7AM, please contact night-coverage [email or web address] Password [facility] 02/02/2020, 1:41 PM

## 2020-02-02 NOTE — Progress Notes (Signed)
   02/02/20 1700  Colostomy LUQ  Placement Date: 01/26/20   Location: LUQ  Ostomy Pouch 2 piece  Treatment Pouch change (taught wife how to change pouch)  Ileostomy RLQ  Placement Date: 01/26/20   Location: RLQ  Ostomy Pouch 2 piece  Treatment Pouch change

## 2020-02-02 NOTE — Progress Notes (Signed)
7 Days Post-Op   Subjective/Chief Complaint: Tolerated NGT clamped - hoping it comes out   Objective: Vital signs in last 24 hours: Temp:  [98.5 F (36.9 C)-100.1 F (37.8 C)] 99.4 F (37.4 C) (02/14 0513) Pulse Rate:  [73-76] 76 (02/14 0513) Resp:  [15-20] 15 (02/14 0513) BP: (139-153)/(84-90) 139/84 (02/14 0513) SpO2:  [99 %-100 %] 99 % (02/14 0513) Last BM Date: 01/28/20(colostomy)  Intake/Output from previous day: 02/13 0701 - 02/14 0700 In: 110 [P.O.:110] Out: 870 [Urine:850; Stool:20] Intake/Output this shift: No intake/output data recorded.  General appearance: alert and cooperative Resp: clear to auscultation bilaterally GI: soft, air and stool in ostomy, some stool on MF  Lab Results:  Recent Labs    02/01/20 0537 02/02/20 0255  WBC 5.7 5.2  HGB 8.2* 8.4*  HCT 26.0* 26.4*  PLT 336 390   BMET Recent Labs    02/01/20 0537 02/02/20 0255  NA 136 135  K 3.9 3.8  CL 104 101  CO2 23 25  GLUCOSE 91 101*  BUN 9 7*  CREATININE 0.73 0.62  CALCIUM 8.7* 8.9   PT/INR No results for input(s): LABPROT, INR in the last 72 hours. ABG No results for input(s): PHART, HCO3 in the last 72 hours.  Invalid input(s): PCO2, PO2  Studies/Results: No results found.  Anti-infectives: Anti-infectives (From admission, onward)   Start     Dose/Rate Route Frequency Ordered Stop   01/29/20 1400  ceFAZolin (ANCEF) IVPB 2g/100 mL premix  Status:  Discontinued     2 g 200 mL/hr over 30 Minutes Intravenous Every 8 hours 01/29/20 0951 02/01/20 0831   01/26/20 1600  piperacillin-tazobactam (ZOSYN) IVPB 3.375 g  Status:  Discontinued     3.375 g 12.5 mL/hr over 240 Minutes Intravenous Every 8 hours 01/26/20 1532 01/29/20 0950   01/25/20 1330  piperacillin-tazobactam (ZOSYN) IVPB 3.375 g  Status:  Discontinued     3.375 g 12.5 mL/hr over 240 Minutes Intravenous Every 6 hours 01/25/20 1320 01/26/20 1532   01/25/20 1130  piperacillin-tazobactam (ZOSYN) IVPB 3.375 g     3.375  g 100 mL/hr over 30 Minutes Intravenous  Once 01/25/20 1118 01/25/20 1225      Assessment/Plan: LBO with cecal perforation Carcinomatosis S/p exploratory laparotomy, right hemicolectomy, end ileostomy, mucus fistula of transverse colon - 01/26/20 Dr. Barry Dienes  - surgical path pending  - D/C NGT and start fulls (did clears with NGT clamped yesterday)  ABL anemia- CBC in AM   [id number], BMET in AM ID:[id number], lovenox ID:IV zosyn 2/6>2/10; Ancef 2/10-2/13 Foley:removed 2/9 Follow up: Dr. Barry Dienes  LOS: 8 days    Juan Richards 02/02/2020

## 2020-02-03 ENCOUNTER — Encounter (HOSPITAL_COMMUNITY): Payer: Self-pay | Admitting: Internal Medicine

## 2020-02-03 DIAGNOSIS — C762 Malignant neoplasm of abdomen: Secondary | ICD-10-CM

## 2020-02-03 LAB — CBC
HCT: 27.2 % — ABNORMAL LOW (ref 39.0–52.0)
Hemoglobin: 8.9 g/dL — ABNORMAL LOW (ref 13.0–17.0)
MCH: 29.5 pg (ref 26.0–34.0)
MCHC: 32.7 g/dL (ref 30.0–36.0)
MCV: 90.1 fL (ref 80.0–100.0)
Platelets: 472 10*3/uL — ABNORMAL HIGH (ref 150–400)
RBC: 3.02 MIL/uL — ABNORMAL LOW (ref 4.22–5.81)
RDW: 13.6 % (ref 11.5–15.5)
WBC: 5.4 10*3/uL (ref 4.0–10.5)
nRBC: 0 % (ref 0.0–0.2)

## 2020-02-03 LAB — PHOSPHORUS: Phosphorus: 3.5 mg/dL (ref 2.5–4.6)

## 2020-02-03 LAB — COMPREHENSIVE METABOLIC PANEL
ALT: 81 U/L — ABNORMAL HIGH (ref 0–44)
AST: 68 U/L — ABNORMAL HIGH (ref 15–41)
Albumin: 2.1 g/dL — ABNORMAL LOW (ref 3.5–5.0)
Alkaline Phosphatase: 105 U/L (ref 38–126)
Anion gap: 11 (ref 5–15)
BUN: 8 mg/dL (ref 8–23)
CO2: 26 mmol/L (ref 22–32)
Calcium: 9.1 mg/dL (ref 8.9–10.3)
Chloride: 96 mmol/L — ABNORMAL LOW (ref 98–111)
Creatinine, Ser: 0.65 mg/dL (ref 0.61–1.24)
GFR calc Af Amer: >60 mL/min (ref >60)
GFR calc non Af Amer: >60 mL/min (ref >60)
Glucose, Bld: 106 mg/dL — ABNORMAL HIGH (ref 70–99)
Potassium: 3.7 mmol/L (ref 3.5–5.1)
Sodium: 133 mmol/L — ABNORMAL LOW (ref 135–145)
Total Bilirubin: 0.9 mg/dL (ref 0.3–1.2)
Total Protein: 6.4 g/dL — ABNORMAL LOW (ref 6.5–8.1)

## 2020-02-03 LAB — GLUCOSE, CAPILLARY
Glucose-Capillary: 100 mg/dL — ABNORMAL HIGH (ref 70–99)
Glucose-Capillary: 111 mg/dL — ABNORMAL HIGH (ref 70–99)
Glucose-Capillary: 119 mg/dL — ABNORMAL HIGH (ref 70–99)
Glucose-Capillary: 112 mg/dL — ABNORMAL HIGH (ref 70–99)

## 2020-02-03 LAB — MAGNESIUM: Magnesium: 1.8 mg/dL (ref 1.7–2.4)

## 2020-02-03 MED ORDER — ADULT MULTIVITAMIN W/MINERALS CH
1.0000 | ORAL_TABLET | Freq: Every day | ORAL | Status: DC
  Administered 2020-02-03 – 2020-02-07 (×5): 1 via ORAL
  Filled 2020-02-03 (×5): qty 1

## 2020-02-03 MED ORDER — OXYCODONE HCL 5 MG PO TABS
5.0000 mg | ORAL_TABLET | ORAL | Status: DC | PRN
  Administered 2020-02-03 – 2020-02-05 (×4): 10 mg via ORAL
  Administered 2020-02-05 – 2020-02-06 (×2): 5 mg via ORAL
  Administered 2020-02-06 – 2020-02-07 (×2): 10 mg via ORAL
  Filled 2020-02-03 (×2): qty 2
  Filled 2020-02-03: qty 1
  Filled 2020-02-03: qty 2
  Filled 2020-02-03: qty 1
  Filled 2020-02-03 (×3): qty 2

## 2020-02-03 MED ORDER — ENSURE ENLIVE PO LIQD
237.0000 mL | Freq: Three times a day (TID) | ORAL | Status: DC
  Administered 2020-02-03: 237 mL via ORAL

## 2020-02-03 MED ORDER — ACETAMINOPHEN 500 MG PO TABS
1000.0000 mg | ORAL_TABLET | Freq: Three times a day (TID) | ORAL | Status: DC
  Administered 2020-02-03 – 2020-02-07 (×12): 1000 mg via ORAL
  Filled 2020-02-03 (×13): qty 2

## 2020-02-03 NOTE — Progress Notes (Signed)
SURGICAL PATHOLOGY  CASE: [id number]  PATIENT: Juan Richards  Surgical Pathology Report      Clinical History: Pneumoperitoneum (cm)      FINAL MICROSCOPIC DIAGNOSIS:   A. 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  - Tumor invades the visceral peritoneum.  - Margins of resection are not involved.  - Ten lymph nodes, negative for metastatic carcinoma (0/10).  - Twelve tumor deposits.  - See oncology table.   B. ILEUM, SEGMENTAL RESECTION:  - Metastatic carcinoma involving mesentery.   ONCOLOGY TABLE:   APPENDIX:   Procedure: Right hemicolectomy  Tumor Size: 1.9 cm  Histologic Type: Goblet cell adenocarcinoma  Histologic Grade: G2-3: Moderately to poorly differentiated  Tumor Extension: Tumor invades the visceral peritoneum  Margins: Uninvolved by tumor  Lymphovascular Invasion: Not identified  Tumor Deposits: Present    Number: 12  Regional Lymph Nodes:    Number of Lymph Nodes Involved: 0    Number of Lymph Nodes Examined: 10  Pathologic Stage Classification (pTNM, AJCC 8th Edition): pT4a, pN1,  pM1b  Representative Tumor Block: A3  Comment: Immunohistochemistry for CK7, CK20 and CDX-2 is positive in  goblet cell adenocarcinoma. Synaptophysin demonstrates patchy weak  positive staining. Chromogranin, CD56, PSA and Prostein are negative.  Tumor invading the visceral peritoneum, as well as a select tumor  deposit, are positive for CDX-2. Intradepartmental consultation (Dr.  Vic Ripper, select slides).   Will review with Dr. Rosendo Gros and discuss timing of Oncology consult.

## 2020-02-03 NOTE — Progress Notes (Signed)
PT Cancellation Note  Patient Details Name: Juan Richards MRN: [id number] DOB: August 03, 1956   Cancelled Treatment:    Reason Eval/Treat Not Completed: Patient at procedure or test/unavailable. On second attempt for PT session, NP with pt for consult. PT will follow up at later date/time.   Zachary George PT, DPT 3:48 PM,02/03/20    Juan Richards 02/03/2020, 3:47 PM

## 2020-02-03 NOTE — Progress Notes (Addendum)
Nutrition Follow-up  RD working remotely.  DOCUMENTATION CODES:   Non-severe (moderate) malnutrition in context of chronic illness  INTERVENTION:   -MVI with minerals daily -D/c Boost Breeze -Ensure Enlive po TID, each supplement provides 350 kcal and 20 grams of protein -Magic cup TID with meals, each supplement provides 290 kcal and 9 grams of protein  NUTRITION DIAGNOSIS:   Moderate Malnutrition related to chronic illness(diverticulitis with perforated cecum) as evidenced by energy intake < 75% for > or equal to 1 month, mild fat depletion, mild muscle depletion, moderate muscle depletion, percent weight loss.  Ongoing  GOAL:   Patient will meet greater than or equal to 90% of their needs  Progressing   MONITOR:   PO intake, Supplement acceptance, Diet advancement, Labs, Weight trends, Skin, I & O's  REASON FOR ASSESSMENT:   Consult Assessment of nutrition requirement/status  ASSESSMENT:   Juan Richards is a 64 y.o. male with medical history significant of prostate cancer status post robotic assisted prostatectomy 2019, radiation therapy completed in November 2020.  Patient has history of prediabetes.  Last colonoscopy was September 17, 2019 with findings of diverticulosis without diverticulitis, sigmoid stricture was noted.  She had a PET scan October 15, 2019 - for any metastatic disease but positive for sigmoid diverticulitis and small contained perforation.  He has been treated with Flagyl and Cipro for that finding.  He was seen in GI clinic on 01/24/2020 for complaint of abdominal distention, loss of appetite, and constipation.  Routine laboratory was ordered.  He was given a regimen for constipation. Abd films were ordered but report not finalized. On review there is classic "stack of coins" sign c/w SBO. Because of continued pain and distention her presents to [facility] for further evaluation.  2/7- s/p Procedure(s): Exploratory laparotomy, right hemicolectomy with  end ileostomy, mucus fistula of transverse colon (operative dx: Perforated cecum, carcinomatosis, sigmoid with dense adhesion to pelvic side wall) 2/8- NGT removed 2/9- advanced to clear liquid diet 2/10- NPO due to emesis, NGT replaced 2/12- NGT clamped, advanced to clears 2/14- NGT removed, advanced to fulls 2/15- advanced to soft diet  Reviewed I/O's: +44 ml x 24 hours and -2 L since admission  UOP: 825 ml x 24 hours  Ileostomy output: 491 ml x 24 hours  Pt just advanced to soft diet. Minimal meal completion data documented. Per [facility], he is taking most of his Boost Breeze supplements. Due to diet advancement and increased nutritional needs for healing, RD will add Ensure Enlive and Magic Cup supplements due to increased nutrient density.   Per MD notes, plan to d/c to CIR once medically stable.   Labs reviewed: Na: 133, CBGS: 102-123 (inpatient orders for glycemic control are 0-15 units insulin aspart TID with meals).   Diet Order:   Diet Order            DIET SOFT Room service appropriate? Yes; Fluid consistency: Thin  Diet effective now              EDUCATION NEEDS:   Not appropriate for education at this time  Skin:  Skin Assessment: Skin Integrity Issues: Skin Integrity Issues:: Wound VAC Wound Vac: abdomen Incisions: closed abdomen  Last BM:  02/02/20 (350 ml via ileostomy)  Height:   Ht Readings from Last 1 Encounters:  01/25/20 5\' 9"  (1.753 m)    Weight:   Wt Readings from Last 1 Encounters:  01/25/20 87.2 kg    Ideal Body Weight:  72.7 kg  BMI:  Body mass index is 28.39 kg/m.  Estimated Nutritional Needs:   Kcal:  [id number]  Protein:  130-145 grams  Fluid:  > 2.3 L    Juan Richards, RD, LDN, [place] Registered Dietitian II Certified Diabetes Care and Education Specialist Please refer to [facility] for RD and/or RD on-call/weekend/after hours pager

## 2020-02-03 NOTE — Progress Notes (Signed)
8 Days Post-Op    CC: Abdominal pain, distention/constipation  Subjective: Patient is in bed this AM.  Wound VAC is in place.  It was changed yesterday.  He has good output through his ileostomy.  All the bags were changed yesterday but he has not done this on his own.  He has a fair amount of pain with movement.  I also learned he has a fair amount of anxiety.  His wife is due to have rotator cuff surgery, and there is some uncertainty as to how they can handle both issues. Seen by CIR but will require MD peer to peer review to get him into CIR.(01/29/20)  Objective: Vital signs in last 24 hours: Temp:  [98 F (36.7 C)-99.8 F (37.7 C)] 98 F (36.7 C) (02/15 0429) Pulse Rate:  [75-94] 75 (02/15 0429) Resp:  [18] 18 (02/15 0429) BP: (123-144)/(79-89) 137/81 (02/15 0429) SpO2:  [96 %-100 %] 99 % (02/15 0429) Last BM Date: 02/03/20(has ostomy-ileostomy) 1360 PO trvptfrf NO IV recorded Drain 0 Colostomy/ileostomy 491 Afebrile, TM 99.8, VSS No labs today Prealbumin 8.8(2/13)  Intake/Output from previous day: 02/14 0701 - 02/15 0700 In: 1360 [P.O.:1360] Out: 1316 [Urine:825; Stool:491] Intake/Output this shift: Total I/O In: -  Out: 625 [Urine:275; Stool:350]  General appearance: alert, cooperative, no distress and Fairly anxious. Resp: clear to auscultation bilaterally and Moving 1250 on I-S. Cardio: Regular rate and rhythm GI: He has a wound VAC on the midline incision that was replaced yesterday.  Positive bowel sounds he still has a fair amount of discomfort in the midline.  Ileostomy is working well.  Bag has been changed and currently there is nothing in the mucous fistula bag. Extremities: extremities normal, atraumatic, no cyanosis or edema  Lab Results:  Recent Labs    02/01/20 0537 02/02/20 0255  WBC 5.7 5.2  HGB 8.2* 8.4*  HCT 26.0* 26.4*  PLT 336 390    BMET Recent Labs    02/01/20 0537 02/02/20 0255  NA 136 135  K 3.9 3.8  CL 104 101  CO2 23 25   GLUCOSE 91 101*  BUN 9 7*  CREATININE 0.73 0.62  CALCIUM 8.7* 8.9   PT/INR No results for input(s): LABPROT, INR in the last 72 hours.  Recent Labs  Lab 02/02/20 0255  AST 37  ALT 34  ALKPHOS 82  BILITOT 0.7  PROT 6.2*  ALBUMIN 2.0*     Lipase  No results found for: LIPASE   Medications: . enoxaparin (LOVENOX) injection  40 mg Subcutaneous Q24H  . feeding supplement  1 Container Oral TID BM  . insulin aspart  0-15 Units Subcutaneous TID WC  . lip balm  1 application Topical BID  . pantoprazole (PROTONIX) IV  40 mg Intravenous Q24H   . methocarbamol (ROBAXIN) IV 1,000 mg (02/02/20 2128)   Anti-infectives (From admission, onward)   Start     Dose/Rate Route Frequency Ordered Stop   01/29/20 1400  ceFAZolin (ANCEF) IVPB 2g/100 mL premix  Status:  Discontinued     2 g 200 mL/hr over 30 Minutes Intravenous Every 8 hours 01/29/20 0951 02/01/20 0831   01/26/20 1600  piperacillin-tazobactam (ZOSYN) IVPB 3.375 g  Status:  Discontinued     3.375 g 12.5 mL/hr over 240 Minutes Intravenous Every 8 hours 01/26/20 1532 01/29/20 0950   01/25/20 1330  piperacillin-tazobactam (ZOSYN) IVPB 3.375 g  Status:  Discontinued     3.375 g 12.5 mL/hr over 240 Minutes Intravenous Every 6 hours 01/25/20 1320  01/26/20 1532   01/25/20 1130  piperacillin-tazobactam (ZOSYN) IVPB 3.375 g     3.375 g 100 mL/hr over 30 Minutes Intravenous  Once 01/25/20 1118 01/25/20 1225      Assessment/Plan Hx of prostate cancer s/p XRT Prediabetes Severe malnutrition - prealbumin 8.8    Perforated cecum, with gross contamination, succus, carcinomatosis, sigmoid with dense adhesion to pelvic side wall  S/p exploratory laparotomy, right hemicolectomy, end ileostomy, mucus fistula of transverse colon - 01/26/20 Dr. Barry Dienes  - pathology still pending  FEN:  Full liquids ID: Ancef 2/10-2/13; Zosyn 2/6-2/10 DVT:  lovenox Follow up: [facility] Pain control: Morphine 4 mg IV x2  Plan: Advance his diet. Work on  OT/PT.  Work on pain control, he said p.o. pain medicines earlier made him nauseated.  He attributed this to having nothing on his stomach.  I have done a face-to-face so we can start working on getting him home health if he does not go to CIR.  Ask PT to come back and work with him.      LOS: 9 days    Sophie Quiles 02/03/2020 Please see Amion

## 2020-02-03 NOTE — Consult Note (Addendum)
Juan Richards  [phone] [phone]   MEDICAL ONCOLOGY - INITIAL CONSULTATION  Referral MD: Dr. Adan Sis  Reason for Referral: Newly diagnosed goblet cell adenocarcinoma of the appendix  HPI: [name] is a 64 year old male with a past medical history significant for prostate cancer status post robotic assisted prostatectomy in 2019 followed by radiation therapy completed November 2020 and history of prediabetes.  The patient presented to the emergency room with acute abdominal pain and distention with constipation.  The patient had his last colonoscopy on 09/17/2019 which showed diverticulosis without diverticulitis and a sigmoid stricture was noted.  He also had a PET scan ordered by his urologist which did not show any evidence of metastatic disease but did show sigmoid diverticulitis and a small contained perforation.  He was treated with Cipro and Flagyl.  He was then seen in the GI clinic on 01/24/2020 with complaints of abdominal distention, anorexia, and constipation.  He was given medication for constipation and had an abdominal x-ray.  The abdominal x-ray showed large stool burden with dilated air-filled loops of large bowel and distal large bowel obstruction could not be excluded.  Due to his continued abdominal pain and distention, he presented to the ER for further evaluation.  In the ER, labs were notable only for a glucose of 155.  He was noted to have a normal CBC and CMET.  CT the abdomen pelvis was performed on 01/25/2020 which showed severe wall thickening of proximal sigmoid colon consistent with sigmoid diverticulitis, significant dilatation of the more proximal colon is concerning for some degree of obstruction, large amount of residual stool noted in the right colon, wall thickening involving the superior portion of the urinary bladder with probable fistula formation extending to the inflamed segment of the sigmoid colon, moderate to severe small  bowel dilatation which is concerning for distal small bowel obstruction.  The patient has been seen by urology, general surgery, and GI this admission.  He was taken to the OR on 01/26/2020 for exploratory laparotomy, right hemicolectomy with end ileostomy, mucous fistula transverse colon.  Pathology from the right hemicolectomy showed goblet cell adenocarcinoma of the appendix with tumor that invades the visceral peritoneum, negative margins, 10 lymph nodes negative for metastatic disease, pT4a, pN1, pM1b.  The pathology from the ileum showed metastatic carcinoma involving the mesentery.    The patient's wife was at the bedside at the time my visit.  When seen today, the patient is already very upset and frustrated.  He was hoping to go to inpatient rehabilitation but it sounds as though there is an issue with his insurance.  He had not been told about his newly diagnosed appendiceal cancer prior to my visit. He reports that he remains very weak overall.  He has a lot of difficulty sitting up due to his recent surgery.  He can ambulate very short distances to the bathroom with a walker.  He reports that while receiving radiation for his prostate cancer, he developed worsening abdominal distention, anorexia, and constipation.  He still reports some mild abdominal discomfort today.  He reports that his appetite is improving.  No recent fever or chills.  No headaches or dizziness.  Denies chest pain, shortness of breath, cough.  He is not having any nausea or vomiting.  No bleeding has been noted. Medical oncology was asked see the patient to make recommendations regarding his newly diagnosed adenocarcinoma of the appendix.    Past Medical History:  Diagnosis Date  .  DIVERTICULITIS, COLON, WITH PERFORATION 07/23/2009  . Numbness and tingling    RIGHT TOES AND LEFT LEG DUE TO L 5 DISC   . PLEURISY YRS AGO  . Prostate cancer ([place])   . STENOSIS, LUMBAR SPINE 09/27/2007  :  Past Surgical History:  Procedure  Laterality Date  . BACK SURGERY  2017   L4 BACK SURGERY  . COLONOSCOPY    . LAPAROTOMY N/A 01/26/2020   Procedure: EXPLORATORY LAPAROTOMY  WITH RIGHT HEMICOLECTOMY,  END ILEOSTOMY, AND MUCUS FISTULA.;  Surgeon: [name], MD;  Location: [facility];  Service: General;  Laterality: N/A;  . PELVIC LYMPH NODE DISSECTION Bilateral 09/21/2018   Procedure: BILATERAL PELVIC LYMPH NODE DISSECTION;  Surgeon: Ardis Hughs, MD;  Location: [facility];  Service: Urology;  Laterality: Bilateral;  . POLYPECTOMY    . ROBOT ASSISTED LAPAROSCOPIC RADICAL PROSTATECTOMY N/A 09/21/2018   Procedure: XI ROBOTIC ASSISTED LAPAROSCOPIC RADICAL PROSTATECTOMY;  Surgeon: Ardis Hughs, MD;  Location: [facility];  Service: Urology;  Laterality: N/A;  :  Current Facility-Administered Medications  Medication Dose Route Frequency Provider Last Rate Last Admin  . acetaminophen (TYLENOL) tablet 1,000 mg  1,000 mg Oral Q8H Earnstine Regal, PA-C   1,000 mg at 02/03/20 1509  . alum & mag hydroxide-simeth (MAALOX/MYLANTA) 200-200-20 MG/5ML suspension 30 mL  30 mL Oral Q6H PRN Michael Boston, MD      . diphenhydrAMINE (BENADRYL) injection 12.5-25 mg  12.5-25 mg Intravenous Q6H PRN Michael Boston, MD      . enalaprilat (VASOTEC) injection 0.625-1.25 mg  0.625-1.25 mg Intravenous Q6H PRN Michael Boston, MD      . enoxaparin (LOVENOX) injection 40 mg  40 mg Subcutaneous Q24H Rolm Bookbinder, MD   40 mg at 02/03/20 1509  . feeding supplement (ENSURE ENLIVE) (ENSURE ENLIVE) liquid 237 mL  237 mL Oral TID BM Hongalgi, Lenis Dickinson, MD   237 mL at 02/03/20 1058  . insulin aspart (novoLOG) injection 0-15 Units  0-15 Units Subcutaneous TID WC [name], MD   2 Units at 01/26/20 1802  . lip balm (CARMEX) ointment 1 application  1 application Topical BID Michael Boston, MD   1 application at 16/94/50 1058  . magic mouthwash  15 mL Oral QID PRN Michael Boston, MD      . menthol-cetylpyridinium (CEPACOL) lozenge 3 mg  1 lozenge Oral PRN Rayburn,  Kelly A, PA-C      . methocarbamol (ROBAXIN) 1,000 mg in dextrose 5 % 100 mL IVPB  1,000 mg Intravenous Q6H PRN Michael Boston, MD 200 mL/hr at 02/02/20 2128 1,000 mg at 02/02/20 2128  . morphine 2 MG/ML injection 2-4 mg  2-4 mg Intravenous Q2H PRN Rayburn, Kelly A, PA-C   4 mg at 02/02/20 2023  . multivitamin with minerals tablet 1 tablet  1 tablet Oral Daily Modena Jansky, MD   1 tablet at 02/03/20 1058  . naphazoline-glycerin (CLEAR EYES REDNESS) ophth solution 1-2 drop  1-2 drop Both Eyes QID PRN Michael Boston, MD      . ondansetron [facility]) injection 4 mg  4 mg Intravenous Q6H PRN [name], MD   4 mg at 02/01/20 0820  . oxyCODONE (Oxy IR/ROXICODONE) immediate release tablet 5-10 mg  5-10 mg Oral Q4H PRN Earnstine Regal, PA-C      . pantoprazole (PROTONIX) injection 40 mg  40 mg Intravenous Q24H Rayburn, Kelly A, PA-C   40 mg at 02/03/20 1159  . phenol (CHLORASEPTIC) mouth spray 1 spray  1 spray Mouth/Throat PRN Rayburn,  Kelly A, PA-C   1 spray at 01/27/20 1429  . prochlorperazine (COMPAZINE) injection 10 mg  10 mg Intravenous Q6H PRN Rayburn, Kelly A, PA-C   10 mg at 02/03/20 0853     No Known Allergies:  Family History  Problem Relation Age of Onset  . Diabetes Mother   . Kidney disease Mother   . Cancer Father        ?  . Diabetes Maternal Grandmother   . Colon cancer Brother   . Esophageal cancer Neg Hx   . Rectal cancer Neg Hx   . Stomach cancer Neg Hx   :  Social History   Socioeconomic History  . Marital status: Married    Spouse name: Not on file  . Number of children: 1  . Years of education: 57  . Highest education level: Not on file  Occupational History  . Occupation: Production assistant, radio  Tobacco Use  . Smoking status: Never Smoker  . Smokeless tobacco: Never Used  Substance and Sexual Activity  . Alcohol use: Yes    Alcohol/week: 2.0 - 3.0 standard drinks    Types: 2 - 3 Cans of beer per week    Comment: OCC  . Drug use: No  . Sexual  activity: Yes  Other Topics Concern  . Not on file  Social History Narrative   Fun: Watch TV   Social Determinants of Health   Financial Resource Strain:   . Difficulty of Paying Living Expenses: Not on file  Food Insecurity:   . Worried About Charity fundraiser in the Last Year: Not on file  . Ran Out of Food in the Last Year: Not on file  Transportation Needs:   . Lack of Transportation (Medical): Not on file  . Lack of Transportation (Non-Medical): Not on file  Physical Activity:   . Days of Exercise per Week: Not on file  . Minutes of Exercise per Session: Not on file  Stress:   . Feeling of Stress : Not on file  Social Connections:   . Frequency of Communication with Friends and Family: Not on file  . Frequency of Social Gatherings with Friends and Family: Not on file  . Attends Religious Services: Not on file  . Active Member of Clubs or Organizations: Not on file  . Attends Archivist Meetings: Not on file  . Marital Status: Not on file  Intimate Partner Violence:   . Fear of Current or Ex-Partner: Not on file  . Emotionally Abused: Not on file  . Physically Abused: Not on file  . Sexually Abused: Not on file  :  Review of Systems: A comprehensive 14 point review of systems was negative except as noted in the HPI.  Exam: Patient Vitals for the past 24 hrs:  BP Temp Temp src Pulse Resp SpO2  02/03/20 1502 138/89 98.9 F (37.2 C) Oral 89 18 100 %  02/03/20 1300 138/89 98.9 F (37.2 C) Oral 88 18 100 %  02/03/20 0429 137/81 98 F (36.7 C) Oral 75 18 99 %  02/02/20 2137 123/79 99.3 F (37.4 C) Oral 94 18 96 %    General: Alert, no distress, anxious and tearful at times.   Eyes:  no scleral icterus.   ENT:  There were no oropharyngeal lesions.   Lymphatics:  Negative cervical, supraclavicular or axillary adenopathy.   Respiratory: lungs were clear bilaterally without wheezing or crackles.   Cardiovascular:  Regular rate and rhythm, S1/S2, without  murmur,  rub or gallop.  There was no pedal edema.   GI: Positive bowel sounds with wound VAC on the midline incision.  He is still having a fair amount of discomfort at the midline.  Ileostomy working well. Musculoskeletal:  no spinal tenderness of palpation of vertebral spine.   Skin exam was without echymosis, petichae.   Neuro exam was nonfocal. Patient was alert and oriented.  Attention was good.   Language was appropriate. Speech was not pressured.  Thought content was not tangential.     Lab Results  Component Value Date   WBC 5.4 02/03/2020   HGB 8.9 (L) 02/03/2020   HCT 27.2 (L) 02/03/2020   PLT 472 (H) 02/03/2020   GLUCOSE 106 (H) 02/03/2020   CHOL 183 10/29/2019   TRIG 135.0 10/29/2019   HDL 55.50 10/29/2019   LDLCALC 100 (H) 10/29/2019   ALT 81 (H) 02/03/2020   AST 68 (H) 02/03/2020   NA 133 (L) 02/03/2020   K 3.7 02/03/2020   CL 96 (L) 02/03/2020   CREATININE 0.65 02/03/2020   BUN 8 02/03/2020   CO2 26 02/03/2020    DG Abd 1 View  Result Date: 01/29/2020 CLINICAL DATA:  Follow-up. Post exploratory laparotomy with right hemicolectomy and end ileostomy. Persistent pain. Nausea today. Evaluate ileus. EXAM: ABDOMEN - 1 VIEW COMPARISON:  Radiograph 01/26/2020, CT 01/25/2020 FINDINGS: Dilated partially air-filled small bowel measuring up to 4.7 cm in the lower abdomen. Ostomy noted in the right lower quadrant. Residual stool in the splenic flexure. Chain sutures noted in the left mid abdomen. No radiopaque calculi or abnormal soft tissue calcifications. IMPRESSION: Bowel-gas pattern most consistent with postoperative ileus with mild gaseous small bowel dilatation. Electronically Signed   By: Keith Rake M.D.   On: 01/29/2020 14:38   DG Abd 1 View  Result Date: 01/26/2020 CLINICAL DATA:  64 year old male with possible pneumoperitoneum on supine abdominal radiograph EXAM: ABDOMEN - 1 VIEW COMPARISON:  Film earlier today FINDINGS: Upright abdominal radiograph demonstrates a  large amount of pneumoperitoneum underlying the diaphragm. Distended small bowel loops are again noted. IMPRESSION: 1. Large amount of pneumoperitoneum underlying the diaphragm compatible with bowel perforation. 2. Distended small bowel loops again noted. Critical Value/emergent results were called by telephone at the time of interpretation on 01/26/2020 at 9:23 am to provider Dr. Barry Dienes, who verbally acknowledged these results. Electronically Signed   By: Margarette Canada M.D.   On: 01/26/2020 09:24   DG Abd 1 View  Result Date: 01/26/2020 CLINICAL DATA:  Small-bowel obstruction. EXAM: ABDOMEN - 1 VIEW COMPARISON:  01/25/2020 CT, 01/24/2020 radiograph and prior studies FINDINGS: Distended small bowel loops within the LEFT abdomen are noted. Both sides of the distended small bowel walls are well-defined and can be a sign of pneumoperitoneum. Gas and stool in the RIGHT colon is again identified. No other significant changes identified. IMPRESSION: Distended small bowel loops with both sides of the walls well-defined - pneumoperitoneum is not excluded. Consider upright/decubitus views or CT for further evaluation. Critical Value/emergent results were called by telephone at the time of interpretation on 01/26/2020 at 7:42 am to provider Charleston Ropes, RN, who verbally acknowledged these results. Electronically Signed   By: Margarette Canada M.D.   On: 01/26/2020 07:43   CT Abdomen Pelvis W Contrast  Result Date: 01/25/2020 CLINICAL DATA:  Acute generalized abdominal pain, nausea, vomiting. EXAM: CT ABDOMEN AND PELVIS WITH CONTRAST TECHNIQUE: Multidetector CT imaging of the abdomen and pelvis was performed using the standard protocol following bolus administration of  intravenous contrast. CONTRAST:  151m OMNIPAQUE IOHEXOL 300 MG/ML  SOLN COMPARISON:  None. FINDINGS: Lower chest: No acute abnormality. Hepatobiliary: No focal liver abnormality is seen. No gallstones, gallbladder wall thickening, or biliary dilatation. Pancreas:  Unremarkable. No pancreatic ductal dilatation or surrounding inflammatory changes. Spleen: Normal in size without focal abnormality. Adrenals/Urinary Tract: Adrenal glands are unremarkable. Kidneys are normal, without renal calculi, focal lesion, or hydronephrosis. There appears to be severe wall thickening involving superior portion of the urinary bladder, with fistulization to the adjacent inflamed sigmoid colon. Stomach/Bowel: Small sliding-type hiatal hernia is noted. Otherwise stomach is unremarkable. There appears to be moderate to severe small bowel dilatation is noted concerning for distal small bowel obstruction. Definite transition zone is not identified. Severe wall thickening of proximal sigmoid colon is noted concerning for diverticulitis. There is dilatation of the more proximal colon concerning for obstruction. Large amount of stool is noted in the cecum. The appendix is clearly visualized. Vascular/Lymphatic: No significant vascular findings are present. No enlarged abdominal or pelvic lymph nodes. Reproductive: Prostate is unremarkable. Other: No abdominal wall hernia or abnormality. No abdominopelvic ascites. Musculoskeletal: Severe multilevel degenerative disc disease is noted in the lumbar spine. No acute osseous abnormality is noted. IMPRESSION: Severe wall thickening of proximal sigmoid colon is noted consistent with sigmoid diverticulitis. There is significant dilatation of the more proximal colon concerning for some degree of obstruction. Large amount of residual stool is noted in the right colon. There is also noted wall thickening involving the superior portion of the urinary bladder with probable fistula formation extending to the inflamed segment of sigmoid colon. Also noted is moderate to severe small bowel dilatation which is concerning for distal small bowel obstruction, although definite transition zone is not identified. Electronically Signed   By: JMarijo ConceptionM.D.   On:  01/25/2020 11:12   DG Abd 2 Views  Result Date: 01/25/2020 CLINICAL DATA:  Constipation, nausea and vomiting. EXAM: ABDOMEN - 2 VIEW COMPARISON:  July 25, 2009 FINDINGS: Air is seen within markedly dilated loops of large bowel, with a large amount of stool seen within the ascending colon. The haustra markings are preserved throughout the colon. The visualized small bowel loops are normal in caliber. There is no evidence of free air. No radio-opaque calculi or other significant radiographic abnormality is seen. IMPRESSION: Large stool burden with dilated air-filled loops of large bowel. While this may be, in part, chronic in nature, a distal large bowel obstruction cannot be excluded. Electronically Signed   By: TVirgina NorfolkM.D.   On: 01/25/2020 15:46   DG Abd Portable 1V  Result Date: 01/30/2020 CLINICAL DATA:  Postoperative ileus EXAM: PORTABLE ABDOMEN - 1 VIEW COMPARISON:  Yesterday FINDINGS: Gaseous distention of the colon. An ostomy is seen over the right lower quadrant. Enteric tube with tip at the distal stomach. IMPRESSION: 1. Moderate gaseous distention of the colon. 2. The enteric tube is in good position. Electronically Signed   By: JMonte FantasiaM.D.   On: 01/30/2020 09:14   DG Abd Portable 1V  Result Date: 01/29/2020 CLINICAL DATA:  NG tube placement. EXAM: PORTABLE ABDOMEN - 1 VIEW COMPARISON:  Radiograph earlier this day. FINDINGS: Tip and side port of the enteric tube below the diaphragm in the stomach. Dilated small bowel in the lower abdomen again seen. IMPRESSION: Tip and side port of the enteric tube below the diaphragm in the stomach. Electronically Signed   By: MKeith RakeM.D.   On: 01/29/2020 18:27  DG Abd 1 View  Result Date: 01/29/2020 CLINICAL DATA:  Follow-up. Post exploratory laparotomy with right hemicolectomy and end ileostomy. Persistent pain. Nausea today. Evaluate ileus. EXAM: ABDOMEN - 1 VIEW COMPARISON:  Radiograph 01/26/2020, CT 01/25/2020  FINDINGS: Dilated partially air-filled small bowel measuring up to 4.7 cm in the lower abdomen. Ostomy noted in the right lower quadrant. Residual stool in the splenic flexure. Chain sutures noted in the left mid abdomen. No radiopaque calculi or abnormal soft tissue calcifications. IMPRESSION: Bowel-gas pattern most consistent with postoperative ileus with mild gaseous small bowel dilatation. Electronically Signed   By: Keith Rake M.D.   On: 01/29/2020 14:38   DG Abd 1 View  Result Date: 01/26/2020 CLINICAL DATA:  64 year old male with possible pneumoperitoneum on supine abdominal radiograph EXAM: ABDOMEN - 1 VIEW COMPARISON:  Film earlier today FINDINGS: Upright abdominal radiograph demonstrates a large amount of pneumoperitoneum underlying the diaphragm. Distended small bowel loops are again noted. IMPRESSION: 1. Large amount of pneumoperitoneum underlying the diaphragm compatible with bowel perforation. 2. Distended small bowel loops again noted. Critical Value/emergent results were called by telephone at the time of interpretation on 01/26/2020 at 9:23 am to provider Dr. Barry Dienes, who verbally acknowledged these results. Electronically Signed   By: Margarette Canada M.D.   On: 01/26/2020 09:24   DG Abd 1 View  Result Date: 01/26/2020 CLINICAL DATA:  Small-bowel obstruction. EXAM: ABDOMEN - 1 VIEW COMPARISON:  01/25/2020 CT, 01/24/2020 radiograph and prior studies FINDINGS: Distended small bowel loops within the LEFT abdomen are noted. Both sides of the distended small bowel walls are well-defined and can be a sign of pneumoperitoneum. Gas and stool in the RIGHT colon is again identified. No other significant changes identified. IMPRESSION: Distended small bowel loops with both sides of the walls well-defined - pneumoperitoneum is not excluded. Consider upright/decubitus views or CT for further evaluation. Critical Value/emergent results were called by telephone at the time of interpretation on 01/26/2020 at  7:42 am to provider Charleston Ropes, RN, who verbally acknowledged these results. Electronically Signed   By: Margarette Canada M.D.   On: 01/26/2020 07:43   CT Abdomen Pelvis W Contrast  Result Date: 01/25/2020 CLINICAL DATA:  Acute generalized abdominal pain, nausea, vomiting. EXAM: CT ABDOMEN AND PELVIS WITH CONTRAST TECHNIQUE: Multidetector CT imaging of the abdomen and pelvis was performed using the standard protocol following bolus administration of intravenous contrast. CONTRAST:  199m OMNIPAQUE IOHEXOL 300 MG/ML  SOLN COMPARISON:  None. FINDINGS: Lower chest: No acute abnormality. Hepatobiliary: No focal liver abnormality is seen. No gallstones, gallbladder wall thickening, or biliary dilatation. Pancreas: Unremarkable. No pancreatic ductal dilatation or surrounding inflammatory changes. Spleen: Normal in size without focal abnormality. Adrenals/Urinary Tract: Adrenal glands are unremarkable. Kidneys are normal, without renal calculi, focal lesion, or hydronephrosis. There appears to be severe wall thickening involving superior portion of the urinary bladder, with fistulization to the adjacent inflamed sigmoid colon. Stomach/Bowel: Small sliding-type hiatal hernia is noted. Otherwise stomach is unremarkable. There appears to be moderate to severe small bowel dilatation is noted concerning for distal small bowel obstruction. Definite transition zone is not identified. Severe wall thickening of proximal sigmoid colon is noted concerning for diverticulitis. There is dilatation of the more proximal colon concerning for obstruction. Large amount of stool is noted in the cecum. The appendix is clearly visualized. Vascular/Lymphatic: No significant vascular findings are present. No enlarged abdominal or pelvic lymph nodes. Reproductive: Prostate is unremarkable. Other: No abdominal wall hernia or abnormality. No abdominopelvic ascites. Musculoskeletal: Severe multilevel  degenerative disc disease is noted in the lumbar spine.  No acute osseous abnormality is noted. IMPRESSION: Severe wall thickening of proximal sigmoid colon is noted consistent with sigmoid diverticulitis. There is significant dilatation of the more proximal colon concerning for some degree of obstruction. Large amount of residual stool is noted in the right colon. There is also noted wall thickening involving the superior portion of the urinary bladder with probable fistula formation extending to the inflamed segment of sigmoid colon. Also noted is moderate to severe small bowel dilatation which is concerning for distal small bowel obstruction, although definite transition zone is not identified. Electronically Signed   By: Marijo Conception M.D.   On: 01/25/2020 11:12   DG Abd 2 Views  Result Date: 01/25/2020 CLINICAL DATA:  Constipation, nausea and vomiting. EXAM: ABDOMEN - 2 VIEW COMPARISON:  July 25, 2009 FINDINGS: Air is seen within markedly dilated loops of large bowel, with a large amount of stool seen within the ascending colon. The haustra markings are preserved throughout the colon. The visualized small bowel loops are normal in caliber. There is no evidence of free air. No radio-opaque calculi or other significant radiographic abnormality is seen. IMPRESSION: Large stool burden with dilated air-filled loops of large bowel. While this may be, in part, chronic in nature, a distal large bowel obstruction cannot be excluded. Electronically Signed   By: Virgina Norfolk M.D.   On: 01/25/2020 15:46   DG Abd Portable 1V  Result Date: 01/30/2020 CLINICAL DATA:  Postoperative ileus EXAM: PORTABLE ABDOMEN - 1 VIEW COMPARISON:  Yesterday FINDINGS: Gaseous distention of the colon. An ostomy is seen over the right lower quadrant. Enteric tube with tip at the distal stomach. IMPRESSION: 1. Moderate gaseous distention of the colon. 2. The enteric tube is in good position. Electronically Signed   By: Monte Fantasia M.D.   On: 01/30/2020 09:14   DG Abd Portable  1V  Result Date: 01/29/2020 CLINICAL DATA:  NG tube placement. EXAM: PORTABLE ABDOMEN - 1 VIEW COMPARISON:  Radiograph earlier this day. FINDINGS: Tip and side port of the enteric tube below the diaphragm in the stomach. Dilated small bowel in the lower abdomen again seen. IMPRESSION: Tip and side port of the enteric tube below the diaphragm in the stomach. Electronically Signed   By: Keith Rake M.D.   On: 01/29/2020 18:27    Pathology:  SURGICAL PATHOLOGY  CASE: [id number]  PATIENT: Juan Richards  Surgical Pathology Report    Clinical History: Pneumoperitoneum (cm)   FINAL MICROSCOPIC DIAGNOSIS:   A. 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  - Tumor invades the visceral peritoneum.  - Margins of resection are not involved.  - Ten lymph nodes, negative for metastatic carcinoma (0/10).  - Twelve tumor deposits.  - See oncology table.   B. ILEUM, SEGMENTAL RESECTION:  - Metastatic carcinoma involving mesentery.   ONCOLOGY TABLE:   APPENDIX:   Procedure: Right hemicolectomy  Tumor Size: 1.9 cm  Histologic Type: Goblet cell adenocarcinoma  Histologic Grade: G2-3: Moderately to poorly differentiated  Tumor Extension: Tumor invades the visceral peritoneum  Margins: Uninvolved by tumor  Lymphovascular Invasion: Not identified  Tumor Deposits: Present    Number: 12  Regional Lymph Nodes:    Number of Lymph Nodes Involved: 0    Number of Lymph Nodes Examined: 10  Pathologic Stage Classification (pTNM, AJCC 8th Edition): pT4a, pN1,  pM1b  Representative Tumor Block: A3  Comment: Immunohistochemistry for  CK7, CK20 and CDX-2 is positive in  goblet cell adenocarcinoma. Synaptophysin demonstrates patchy weak  positive staining. Chromogranin, CD56, PSA and Prostein are negative.  Tumor invading the visceral peritoneum, as well as a select tumor  deposit, are positive for CDX-2. Intradepartmental  consultation (Dr.  Vic Ripper, select slides).  (v4.1.0.0)   GROSS DESCRIPTION:   A. Specimen: Terminal ileum, cecum, appendix and ascending colon.  Length: Terminal ileum: 30 cm in length x 2.5 cm in diameter. Cecum: 8  x 7.5 x 7 cm. Ascending colon: 29 cm in length x 7 cm in diameter.  Serosa: At the junction of the cecum and terminal ileum, there is a 1.9  x 1.5 x 1.3 cm white firm nodule approximating the radial margin. Upon  sectioning, the nodule is grossly contiguous with the appendix. The  remaining serosa and peritonealized surface, including all of the  terminal ileum and ascending colon, are studded with innumerable  tan-white nodules ranging from less than 0.1 to 0.8 cm.  Contents: Filled with firm brown fecal material.  Mucosa/Wall: Tan-pink to green-brown discolored, with normal  folding/Averages 0.2 cm. A discrete mucosal lesion is not grossly  identified.  Lymph nodes: Multiple possible lymph nodes ranging from 0.2 to 1 cm are  identified. The remaining fat is placed in [place] for additional  identification.  Block Summary:  1 = proximal margin en face.  2 = distal margin en face.  3-5 = appendix.  6 = appendiceal orifice.  7 = terminal ileum serosa.  8 = cecum and ascending colon peritonealized surface.  9 = 5 possible lymph nodes.  10 = 1 lymph node bisected.  11 = 1 lymph node bisected.  12 = 3 lymph nodes.  13-17 = multiple possible lymph nodes each.   B. Received fresh is a 32 cm in length x 2.7 cm in diameter segment of  small bowel, received with abundant mesentery. The specimen is received  unoriented, with both ends stapled. The serosa is blue-gray. The  mesentery is studded with innumerable tan-white nodules ranging from  less than 0.1 to 0.7 cm. The mucosa is  tan-green discolored and grossly free of lesions. The wall averages 0.2  cm. Representative sections:  Block summary:  1-2 = margins en face.  3-5 = nodules (AK 01/28/2020).     Final Diagnosis performed by Gillie Manners, MD.  Electronically  signed 02/03/2020   Assessment and Plan:  1. 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 Prostate cancer diagnosed in August 2019, robotic assisted prostatectomy 09/21/2018-Gleason 7 acinar tumor,pT3pN1, invasion of seminal vesicles, 3/16 lymph nodes positive, positive right anterior margin positive  -Gleason score 4+3 equal 7 and PSA of 60.97.    -Staging work-up did not show any evidence of metastatic disease.   -Bone scan 08/17/2018-negative for metastatic disease  -Elevated PSA July 2019  -PET scan scan 10/15/2019-negative for metastatic disease, evidence of sigmoid diverticulitis with a small contained perforation  -Radiation-definitive, 10/29/2019-01/15/2020, interrupted 11/22/2019-12/17/2019 secondary to COVID-19 infection  3. Anemia 4. Deconditioning 5.  COVID-19 infection December 2020  Mr. Fluharty is a 64 year old male with a history of prostate cancer and newly diagnosed appendiceal cancer.  The patient is quite distressed over this new diagnosis. He  is still healing from surgery.  He has mild anemia likely due to underlying malignancy and recent surgery. He is deconditioned.  He is also frustrated that he has not been approved for inpatient rehabilitation.  Recommendations: 1.  Dr. Benay Spice will further discuss treatment options for his newly diagnosed appendiceal cancer. 2.  He was encouraged to work with PT/OT to gain back strength. 3.  Monitor CBC closely.  No need for transfusion at this time.  Thank you for this referral.   Mikey Bussing, DNP, AGPCNP-BC, AOCNP  Mr. Cona was  interviewed and examined.  He was admitted with a bowel obstruction and perforated cecum.  He has been diagnosed with adenocarcinoma of the appendix with carcinomatosis.  He is recovering from surgery.  This occurs in the setting of recurrent prostate cancer.  It is unclear whether the sigmoid colon/bladder findings are related to radiation/diverticulitis or metastatic appendix carcinoma.  Mr. Martel understands no therapy will be curative.  We will consider systemic treatment options when he has recovered from surgery.  We will request MSI and foundation 1 testing on the resected tumor.  We will follow-up on the PSA when he returns to the cancer center.  I will will continue to follow him in the hospital and outpatient follow-up will be scheduled at the Cancer center.

## 2020-02-03 NOTE — Progress Notes (Signed)
Inpatient Rehabilitation Admissions Coordinator  Patient mobilizing well with stand by assist. I met with patient at bedside and explained to him that once he is medically ready , that I feel he can d/c directly home with [facility] for his follow up wound care and patient/family education. His wife has postponed her surgery until patient has recuperated. His mother in law is currently hospitalized which he is concerned about her at this time.. I have alerted RN CM, Almyra Free and SW, [place]. I am not pursuing peer to peer with insurance denial for he has progressed well with his functional mobility.  Danne Baxter, RN, MSN Rehab Admissions Coordinator [phone] 02/03/2020 10:53 AM

## 2020-02-03 NOTE — Progress Notes (Signed)
PROGRESS NOTE   Juan Richards  [id number]    DOB: November 22, 1956    DOA: 01/25/2020  PCP: [name], MD   I have briefly reviewed patients previous medical records in [facility].  Chief Complaint:   Chief Complaint  Patient presents with  . Abdomen Swelling    Brief Narrative:  64 year old male with PMH of metastatic prostate cancer s/p prostatectomy and subsequent radiation for metastatic disease, prediabetes, diverticulosis and prior diverticulitis, PET scan 09/2019 without metastatic disease but showed sigmoid diverticulitis and small contained perforation for which she was treated with Cipro and Flagyl, seen in GI clinic 01/24/2020 for abdominal distention, loss of appetite and constipation presented to ED 2/6 due to worsening abdominal pain and distention.  Admitted for bowel obstruction, acute diverticulitis and colovesical fistula.  General surgery and urology were consulted and initially managed conservatively with bowel rest, IV fluids and antibiotics.  On 2/7, concern for perforated viscus and taken emergently to the OR where he was noted to have perforated cecum, carcinomatosis, sigmoid with dense adhesion to pelvic sidewall.  He underwent exploratory laparotomy, right hemicolectomy with end ileostomy and mucous fistula of transverse colon.  Course complicated by postop ileus, slowly improving and diet being advanced.  General surgery managing.   Assessment & Plan:  Principal Problem:   Bowel perforation (HCC) Active Problems:   UTI (urinary tract infection)   Pre-diabetes   Prostate cancer ([place])   Diverticulitis of large intestine with perforation   SBO (small bowel obstruction) (HCC)   Sepsis ([place])   Neutropenia with fever ([place])   Colovesical fistula   Palliative care by specialist   Acute blood loss anemia   Hyponatremia   Protein-calorie malnutrition, moderate (HCC)   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HCC)   Large bowel obstruction with cecal perforation with  gross contamination: History and management as indicated above.  S/p exploratory laparotomy, right hemicolectomy, end ileostomy, mucous fistula of transverse colon 01/26/2020.  Cecal perforation felt to be from colonic obstruction apparently related to diverticulitis, diverticular stricture.  General surgery continues to manage.  Postop ileus improving, diet being advanced.  Completed course of antibiotics.    UTI: Completed course of antibiotics.  Sepsis, POA: Secondary to large bowel perforation and peritonitis.  Sepsis resolved.  Acute blood loss anemia: Hemoglobin stable in the 8 g range.  Metastatic adenocarcinoma of the appendix/abdominal carcinomatosis: Pathology results as noted below.  Consulted medical oncology, discussed with Dr. Jana Hakim 2/15.  PMT consulted for goals of care on 2/11: Full code/full scope of treatment.  Moderate protein calorie malnutrition: As per registered dietitian note 2/15.  Physical deconditioning: Secondary to acute illness.  Initially CIR was being considered.  As per discussion today with CIR team, he has improved and will not meet criteria for CIR admission.  Body mass index is 28.39 kg/m.  Nutritional Status Nutrition Problem: Moderate Malnutrition Etiology: chronic illness(diverticulitis with perforated cecum) Signs/Symptoms: energy intake < 75% for > or equal to 1 month, mild fat depletion, mild muscle depletion, moderate muscle depletion, percent weight loss Percent weight loss: 16 % Interventions: Ensure Enlive (each supplement provides 350kcal and 20 grams of protein), Magic cup, MVI  DVT prophylaxis: Lovenox Code Status: Full Family Communication: None at bedside Disposition:  . Patient came from: Home           . Anticipated d/c place: Home . Barriers to d/c: Still recovering from extensive abdominal surgery, postop ileus, diet being advanced and awaiting oncology consultation     Pathology results:  SURGICAL PATHOLOGY SURGICAL  PATHOLOGY  CASE: [id number]  PATIENT: Juan Richards  Surgical Pathology Report      Clinical History: Pneumoperitoneum (cm)      FINAL MICROSCOPIC DIAGNOSIS:   A. 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  - Tumor invades the visceral peritoneum.  - Margins of resection are not involved.  - Ten lymph nodes, negative for metastatic carcinoma (0/10).  - Twelve tumor deposits.  - See oncology table.   B. ILEUM, SEGMENTAL RESECTION:  - Metastatic carcinoma involving mesentery.       Consultants:   General surgery Urology [place] [facility] PMT Medical oncology  Procedures:   As noted above  Antimicrobials:   Completed course   Subjective:  Abdomen feels sore but better compared to couple days ago.  Tolerated small amount of diet.  Denies any other complaints  Objective:   Vitals:   02/02/20 2137 02/03/20 0429 02/03/20 1300 02/03/20 1502  BP: 123/79 137/81 138/89 138/89  Pulse: 94 75 88 89  Resp: 18 18 18 18   Temp: 99.3 F (37.4 C) 98 F (36.7 C) 98.9 F (37.2 C) 98.9 F (37.2 C)  TempSrc: Oral Oral Oral Oral  SpO2: 96% 99% 100% 100%  Weight:      Height:        General exam: Pleasant middle-age male sitting up comfortably in bed without distress Respiratory system: Clear to auscultation. Respiratory effort normal. Cardiovascular system: S1 & S2 heard, RRR. No JVD, murmurs, rubs, gallops or clicks. No pedal edema. Gastrointestinal system: Abdomen is nondistended, soft and appropriate postop tenderness.  Has wound VAC on midline incision which is not draining much.  Right ileostomy with soft green stools.  Left mucous fistula bag without much drainage. Central nervous system: Alert and oriented. No focal neurological deficits. Extremities: Symmetric 5 x 5 power. Skin: No rashes, lesions or ulcers Psychiatry: Judgement and insight appear normal. Mood & affect appropriate.     Data Reviewed:   I have  personally reviewed following labs and imaging studies   CBC: Recent Labs  Lab 01/29/20 0133 01/29/20 0133 01/30/20 0230 01/30/20 0230 01/31/20 0411 01/31/20 1303 02/01/20 0537 02/02/20 0255 02/03/20 0701  WBC 3.6*   < > 2.4*   < > 4.1   < > 5.7 5.2 5.4  NEUTROABS 2.9  --  1.7  --  2.9  --   --   --   --   HGB 9.0*   < > 9.1*   < > 8.0*   < > 8.2* 8.4* 8.9*  HCT 27.5*   < > 27.5*   < > 24.8*   < > 26.0* 26.4* 27.2*  MCV 91.7   < > 91.4   < > 92.2   < > 92.9 93.0 90.1  PLT 287   < > 285   < > 307   < > 336 390 472*   < > = values in this interval not displayed.    Basic Metabolic Panel: Recent Labs  Lab 02/01/20 0537 02/02/20 0255 02/03/20 0701  NA 136 135 133*  K 3.9 3.8 3.7  CL 104 101 96*  CO2 23 25 26   GLUCOSE 91 101* 106*  BUN 9 7* 8  CREATININE 0.73 0.62 0.65  CALCIUM 8.7* 8.9 9.1  MG  --  1.7 1.8  PHOS  --  3.1 3.5    Liver Function Tests: Recent Labs  Lab 02/02/20 0255 02/03/20 0701  AST 37  68*  ALT 34 81*  ALKPHOS 82 105  BILITOT 0.7 0.9  PROT 6.2* 6.4*  ALBUMIN 2.0* 2.1*    CBG: Recent Labs  Lab 02/03/20 0802 02/03/20 1210 02/03/20 1708  GLUCAP 100* 111* 119*    Microbiology Studies:   Recent Results (from the past 240 hour(s))  Urine culture     Status: Abnormal   Collection Time: 01/25/20 11:19 AM   Specimen: Urine, Random  Result Value Ref Range Status   Specimen Description URINE, RANDOM  Final   Special Requests   Final    NONE Performed at [facility], [place] [address], [place], [place] [address]    Culture 70,000 COLONIES/mL ESCHERICHIA COLI (A)  Final   Report Status 01/27/2020 FINAL  Final   Organism ID, Bacteria ESCHERICHIA COLI (A)  Final      Susceptibility   Escherichia coli - MIC*    AMPICILLIN >=32 RESISTANT Resistant     CEFAZOLIN <=4 SENSITIVE Sensitive     CEFTRIAXONE <=0.25 SENSITIVE Sensitive     CIPROFLOXACIN <=0.25 SENSITIVE Sensitive     GENTAMICIN >=16 RESISTANT Resistant     IMIPENEM <=0.25  SENSITIVE Sensitive     NITROFURANTOIN <=16 SENSITIVE Sensitive     TRIMETH/SULFA >=320 RESISTANT Resistant     AMPICILLIN/SULBACTAM >=32 RESISTANT Resistant     PIP/TAZO <=4 SENSITIVE Sensitive     * 70,000 COLONIES/mL ESCHERICHIA COLI  SARS CORONAVIRUS 2 (TAT 6-24 HRS) Nasopharyngeal Nasopharyngeal Swab     Status: None   Collection Time: 01/25/20 11:25 AM   Specimen: Nasopharyngeal Swab  Result Value Ref Range Status   SARS Coronavirus 2 NEGATIVE NEGATIVE Final    Comment: (NOTE) SARS-CoV-2 target nucleic acids are NOT DETECTED. The SARS-CoV-2 RNA is generally detectable in upper and lower respiratory specimens during the acute phase of infection. Negative results do not preclude SARS-CoV-2 infection, do not rule out co-infections with other pathogens, and should not be used as the sole basis for treatment or other patient management decisions. Negative results must be combined with clinical observations, patient history, and epidemiological information. The expected result is Negative. Fact Sheet for Patients: SugarRoll.be Fact Sheet for Healthcare Providers: https://www.woods-mathews.com/ This test is not yet approved or cleared by the [place] FDA and  has been authorized for detection and/or diagnosis of SARS-CoV-2 by FDA under an Emergency Use Authorization (EUA). This EUA will remain  in effect (meaning this test can be used) for the duration of the COVID-19 declaration under Section 56 4(b)(1) of the Act, 21 U.S.C. section 360bbb-3(b)(1), unless the authorization is terminated or revoked sooner. Performed at [facility], [place] [address] [place], [place] [address]   MRSA PCR Screening     Status: None   Collection Time: 01/26/20  9:32 AM   Specimen: Nasal Mucosa; Nasopharyngeal  Result Value Ref Range Status   MRSA by PCR NEGATIVE NEGATIVE Final    Comment:        The GeneXpert MRSA Assay (FDA approved for NASAL  specimens only), is one component of a comprehensive MRSA colonization surveillance program. It is not intended to diagnose MRSA infection nor to guide or monitor treatment for MRSA infections. Performed at [facility], [place] [address] [place], [place] [address]   Aerobic/Anaerobic Culture (surgical/deep wound)     Status: None   Collection Time: 01/26/20  1:16 PM   Specimen: Other Source; Body Fluid  Result Value Ref Range Status   Specimen Description WOUND ABDOMEN FLUID  Final   Special Requests PATIENT ON FOLLOWING ZOSYN  Final   Gram Stain   Final    MODERATE WBC PRESENT,BOTH PMN AND MONONUCLEAR NO ORGANISMS SEEN    Culture   Final    RARE ESCHERICHIA COLI RARE CLOSTRIDIUM TERTIUM WITHIN MIXED ANAEROBIC ORGANISMS Performed at [facility], [place] [address], [place], [place] [address]    Report Status 01/29/2020 FINAL  Final   Organism ID, Bacteria ESCHERICHIA COLI  Final      Susceptibility   Escherichia coli - MIC*    AMPICILLIN >=32 RESISTANT Resistant     CEFAZOLIN <=4 SENSITIVE Sensitive     CEFEPIME <=0.12 SENSITIVE Sensitive     CEFTAZIDIME <=1 SENSITIVE Sensitive     CEFTRIAXONE <=0.25 SENSITIVE Sensitive     CIPROFLOXACIN <=0.25 SENSITIVE Sensitive     GENTAMICIN >=16 RESISTANT Resistant     IMIPENEM <=0.25 SENSITIVE Sensitive     TRIMETH/SULFA >=320 RESISTANT Resistant     AMPICILLIN/SULBACTAM >=32 RESISTANT Resistant     PIP/TAZO <=4 SENSITIVE Sensitive     * RARE ESCHERICHIA COLI     Radiology Studies:  No results found.   Scheduled Meds:   . acetaminophen  1,000 mg Oral Q8H  . enoxaparin (LOVENOX) injection  40 mg Subcutaneous Q24H  . feeding supplement (ENSURE ENLIVE)  237 mL Oral TID BM  . insulin aspart  0-15 Units Subcutaneous TID WC  . lip balm  1 application Topical BID  . multivitamin with minerals  1 tablet Oral Daily  . pantoprazole (PROTONIX) IV  40 mg Intravenous Q24H    Continuous Infusions:   . methocarbamol  (ROBAXIN) IV 1,000 mg (02/02/20 2128)     LOS: 9 days     Vernell Leep, MD, [place], [facility]. [facility]    To contact the attending provider between 7A-7P or the covering provider during after hours 7P-7A, please log into the web site [email or web address] and access using universal  password for that web site. If you do not have the password, please call the hospital operator.  02/03/2020, 8:06 PM

## 2020-02-03 NOTE — Consult Note (Addendum)
WOC in to assess patient for educational session. Patient appears withdrawn and depressed he is not very interactive but will answer questions and is knowledgeable about importance of hydration and ostomy care.  RN assessed both ostomies and discussed the dressing changes and asked how the patient is feeling regarding changing the pouches, patient voiced concerns due to abdominal pain that he has difficulty seeing the area he says "he feels he cannot really do anything with them."  It may be helpful to provide the patient with a mirror at bedside so that he can see his ostomies and be more engaged in the process without causing additional physical pain from the abdominal incision.  RN placed order for hand held mirror will see if this helps patient engage more in ostomy care.   [place] nurse will continue to follow for ongoing education.  Austin Miles, RN, [employer]

## 2020-02-03 NOTE — Progress Notes (Signed)
PT Cancellation Note  Patient Details Name: Juan Richards MRN: [id number] DOB: Oct 05, 1956   Cancelled Treatment:    Reason Eval/Treat Not Completed: Patient declined, no reason specified. Upon arrival, pt resting in bed. Pt politely refusing therapy at this time stating he has already been up within the room and sat in the recliner and wants to rest at this time. Pt requesting therapy later today if possible and stating he would do his part and get up again with nursing. PT will follow up at later date/time as able.   Zachary George PT, DPT 11:41 AM,02/03/20    Vang Kraeger Drucilla Chalet 02/03/2020, 11:40 AM

## 2020-02-03 NOTE — Plan of Care (Signed)
  Problem: Pain Managment: Goal: General experience of comfort will improve Outcome: Progressing   Problem: Safety: Goal: Ability to remain free from injury will improve Outcome: Progressing   Problem: Skin Integrity: Goal: Risk for impaired skin integrity will decrease Outcome: Progressing   

## 2020-02-04 LAB — GLUCOSE, CAPILLARY
Glucose-Capillary: 106 mg/dL — ABNORMAL HIGH (ref 70–99)
Glucose-Capillary: 101 mg/dL — ABNORMAL HIGH (ref 70–99)
Glucose-Capillary: 82 mg/dL (ref 70–99)
Glucose-Capillary: 101 mg/dL — ABNORMAL HIGH (ref 70–99)

## 2020-02-04 LAB — BASIC METABOLIC PANEL
Anion gap: 11 (ref 5–15)
BUN: 12 mg/dL (ref 8–23)
CO2: 23 mmol/L (ref 22–32)
Calcium: 9.1 mg/dL (ref 8.9–10.3)
Chloride: 97 mmol/L — ABNORMAL LOW (ref 98–111)
Creatinine, Ser: 0.82 mg/dL (ref 0.61–1.24)
GFR calc Af Amer: >60 mL/min (ref >60)
GFR calc non Af Amer: >60 mL/min (ref >60)
Glucose, Bld: 102 mg/dL — ABNORMAL HIGH (ref 70–99)
Potassium: 3.4 mmol/L — ABNORMAL LOW (ref 3.5–5.1)
Sodium: 131 mmol/L — ABNORMAL LOW (ref 135–145)

## 2020-02-04 LAB — CBC
HCT: 26.6 % — ABNORMAL LOW (ref 39.0–52.0)
Hemoglobin: 8.7 g/dL — ABNORMAL LOW (ref 13.0–17.0)
MCH: 30 pg (ref 26.0–34.0)
MCHC: 32.7 g/dL (ref 30.0–36.0)
MCV: 91.7 fL (ref 80.0–100.0)
Platelets: 522 10*3/uL — ABNORMAL HIGH (ref 150–400)
RBC: 2.9 MIL/uL — ABNORMAL LOW (ref 4.22–5.81)
RDW: 13.6 % (ref 11.5–15.5)
WBC: 5.1 10*3/uL (ref 4.0–10.5)
nRBC: 0 % (ref 0.0–0.2)

## 2020-02-04 MED ORDER — ENSURE MAX PROTEIN PO LIQD
11.0000 [oz_av] | Freq: Every day | ORAL | Status: DC
  Administered 2020-02-04 – 2020-02-06 (×3): 11 [oz_av] via ORAL
  Filled 2020-02-04 (×4): qty 330

## 2020-02-04 MED ORDER — DIAZEPAM 5 MG PO TABS
5.0000 mg | ORAL_TABLET | Freq: Four times a day (QID) | ORAL | Status: DC | PRN
  Administered 2020-02-05 – 2020-02-06 (×3): 5 mg via ORAL
  Filled 2020-02-04 (×3): qty 1

## 2020-02-04 MED ORDER — SODIUM CHLORIDE 0.9 % IV SOLN: INTRAVENOUS | Status: DC

## 2020-02-04 MED ORDER — GLUCERNA SHAKE PO LIQD
237.0000 mL | Freq: Two times a day (BID) | ORAL | Status: DC
  Administered 2020-02-04 – 2020-02-07 (×6): 237 mL via ORAL

## 2020-02-04 MED ORDER — POTASSIUM CHLORIDE 10 MEQ/100ML IV SOLN
10.0000 meq | INTRAVENOUS | Status: DC
  Administered 2020-02-04: 10 meq via INTRAVENOUS
  Filled 2020-02-04: qty 100

## 2020-02-04 MED ORDER — POTASSIUM CHLORIDE CRYS ER 20 MEQ PO TBCR
40.0000 meq | EXTENDED_RELEASE_TABLET | Freq: Once | ORAL | Status: AC
  Administered 2020-02-04: 40 meq via ORAL
  Filled 2020-02-04: qty 2

## 2020-02-04 NOTE — TOC Initial Note (Addendum)
Transition of Care [facility]) - Initial/Assessment Note    Patient Details  Name: Juan Richards MRN: [id number] Date of Birth: 06/05/1956  Transition of Care [facility]) CM/SW Contact:    Marilu Favre, RN Phone Number: 02/04/2020, 1:53 PM  Clinical Narrative:                 Patient from home with wife.   Discussed home health PT,OT and RN.   Explained if patient goes home with VAC on he will have a HHRN to change VAC dressing three times a week. Home VAC and supplies would be delivered here and hospital nurse would change tubing to home VAC prior to discharge. If patient does home with wet to dry dressing patient and wife will have to learn wound care , because [facility] will not be able to make daily dressing changes.   Patient and wife will need to learn ostomy care prior to discharge.   Patient voiced understanding.  Patient stated his wife has started working on getting him a hospital bed also. NCMD offered to order hospital bed through [facility]. NCM provided direct cell number for patient's wife Geni Bers to call regarding bed.   NCM will order hospital bed through [place] [facility] and ask them to hold it for patient.   NCM provided Medicare.gov list of home health agencies.   Seems due to staffing it is difficult to arrange [facility]. NCM will need to know approximate dc date . Also started [facility] paperwork will need PA or MD signature    VAC paperwork in chart for signature and wound measurements    Patient's wife returned phone call. She does want hospital bed through [facility].   All of above explained to patient's wife and voiced understanding. Zack with [facility] aware and will call for delivery. Expected Discharge Plan: [place]     Patient Goals and CMS Choice Patient states their goals for this hospitalization and ongoing recovery are:: to go home CMS Medicare.gov Compare Post Acute Care list provided to:: Patient Choice  offered to / list presented to : Patient  Expected Discharge Plan and Services Expected Discharge Plan: [place]   Discharge Planning Services: CM Consult Post Acute Care Choice: Home Health, Durable Medical Equipment Living arrangements for the past 2 months: Single Family Home                           HH Arranged: RN, PT, OT          Prior Living Arrangements/Services Living arrangements for the past 2 months: Single Family Home Lives with:: Spouse Patient language and need for interpreter reviewed:: Yes Do you feel safe going back to the place where you live?: Yes      Need for Family Participation in Patient Care: Yes (Comment) Care giver support system in place?: Yes (comment)   Criminal Activity/Legal Involvement Pertinent to Current Situation/Hospitalization: No - Comment as needed  Activities of Daily Living Home Assistive Devices/Equipment: None ADL Screening (condition at time of admission) Patient's cognitive ability adequate to safely complete daily activities?: Yes Is the patient deaf or have difficulty hearing?: No Does the patient have difficulty seeing, even when wearing glasses/contacts?: No Does the patient have difficulty concentrating, remembering, or making decisions?: No Patient able to express need for assistance with ADLs?: Yes Does the patient have difficulty dressing or bathing?: No Independently performs ADLs?:  Yes (appropriate for developmental age) Does the patient have difficulty walking or climbing stairs?: No Weakness of Legs: None Weakness of Arms/Hands: None  Permission Sought/Granted   Permission granted to share information with : No              Emotional Assessment Appearance:: Appears stated age Attitude/Demeanor/Rapport: Engaged Affect (typically observed): Accepting Orientation: : Oriented to Self, Oriented to Place, Oriented to  Time, Oriented to Situation Alcohol / Substance Use: Not  Applicable Psych Involvement: No (comment)  Admission diagnosis:  Diverticulitis of large intestine with perforation [K57.20] Acute diverticulitis [K57.92] Patient Active Problem List   Diagnosis Date Noted  . Protein-calorie malnutrition, moderate ([place]) 02/01/2020  . 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 Acute blood loss anemia 01/29/2020  . Hyponatremia 01/29/2020  . Palliative care by specialist   . Bowel perforation ([place]) 01/27/2020  . Sepsis ([place]) 01/26/2020  . Neutropenia with fever ([place]) 01/26/2020  . 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 Diverticulitis of large intestine with perforation 01/25/2020  . SBO (small bowel obstruction) ([place]) 01/25/2020  . Prostate cancer ([place]) 09/21/2018  . Pre-diabetes 06/20/2018  . Routine general medical examination at a health care facility 08/11/2016  . Gross hematuria 07/23/2009  . UTI (urinary tract infection) 09/27/2007  . 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 EPIDIDYMAL CYST 08/31/2007   PCP:  [name], MD Pharmacy:   [facility] [place], [place] [address] [place] [address] Phone: [phone] Fax: [phone]     Social Determinants of Health (SDOH) Interventions    Readmission Risk Interventions No flowsheet data found.

## 2020-02-04 NOTE — Progress Notes (Addendum)
9 Days Post-Op   Subjective/Chief Complaint: Pt with approp abd pain  Frustrated with his sitaution Path was d/w pt yesterday   Objective: Vital signs in last 24 hours: Temp:  [97.7 F (36.5 C)-98.9 F (37.2 C)] 98.5 F (36.9 C) (02/16 0537) Pulse Rate:  [71-89] 71 (02/16 0537) Resp:  [16-18] 18 (02/16 0537) BP: (113-138)/(75-89) 120/75 (02/16 0537) SpO2:  [98 %-100 %] 100 % (02/16 0537) Last BM Date: 02/03/20(thru colostomy)  Intake/Output from previous day: 02/15 0701 - 02/16 0700 In: 900 [P.O.:900] Out: 1475 [Urine:325; Drains:100; Stool:1050] Intake/Output this shift: Total I/O In: -  Out: 100 [Urine:100]   PE: General appearance: alert, cooperative, no distress and Fairly anxious. Resp: clear to auscultation bilaterally and Moving 1250 on I-S. Cardio: Regular rate and rhythm GI: He has a wound VAC on the midline incision that was replaced Sunday.  Positive bowel sounds he still has a fair amount of discomfort in the midline.  Ileostomy is working well.  Bag has been changed and currently there is nothing in the mucous fistula bag. Extremities: extremities normal, atraumatic, no cyanosis or edema  Lab Results:  Recent Labs    02/03/20 0701 02/04/20 0117  WBC 5.4 5.1  HGB 8.9* 8.7*  HCT 27.2* 26.6*  PLT 472* 522*   BMET Recent Labs    02/03/20 0701 02/04/20 0117  NA 133* 131*  K 3.7 3.4*  CL 96* 97*  CO2 26 23  GLUCOSE 106* 102*  BUN 8 12  CREATININE 0.65 0.82  CALCIUM 9.1 9.1   Anti-infectives: Anti-infectives (From admission, onward)   Start     Dose/Rate Route Frequency Ordered Stop   01/29/20 1400  ceFAZolin (ANCEF) IVPB 2g/100 mL premix  Status:  Discontinued     2 g 200 mL/hr over 30 Minutes Intravenous Every 8 hours 01/29/20 0951 02/01/20 0831   01/26/20 1600  piperacillin-tazobactam (ZOSYN) IVPB 3.375 g  Status:  Discontinued     3.375 g 12.5 mL/hr over 240 Minutes Intravenous Every 8 hours 01/26/20 1532 01/29/20 0950   01/25/20 1330   piperacillin-tazobactam (ZOSYN) IVPB 3.375 g  Status:  Discontinued     3.375 g 12.5 mL/hr over 240 Minutes Intravenous Every 6 hours 01/25/20 1320 01/26/20 1532   01/25/20 1130  piperacillin-tazobactam (ZOSYN) IVPB 3.375 g     3.375 g 100 mL/hr over 30 Minutes Intravenous  Once 01/25/20 1118 01/25/20 1225      Assessment/Plan: Hx of prostate cancer s/p XRT Prediabetes Severe malnutrition - prealbumin 8.8    Perforated cecum, with gross contamination, succus, carcinomatosis, sigmoid with dense adhesion to pelvic side wall S/p exploratory laparotomy, right hemicolectomy, end ileostomy, mucus fistula of transverse colon - 01/26/20 Dr. Barry Dienes   FEN:  Full liquids ID: Ancef 2/10-2/13; Zosyn 2/6-2/10 DVT:  lovenox Follow up: Barry Dienes Pain control: Morphine 4 mg IV x2  Plan:  Work on OT/PT.   Had a long talk with him in regard to his condition as he is very frustrated with his new diagnosis. -add valium for muscle spasm   LOS: 10 days    Ralene Ok 02/04/2020

## 2020-02-04 NOTE — Progress Notes (Addendum)
Occupational Therapy Treatment Patient Details Name: Juan Richards MRN: [id number] DOB: [birth year] Today's Date: 02/04/2020    History of present illness Pt is a 64 y/o male admitted with bowel performation s/p ex lap with R hemicolectomy, end ileostomy, and mucous fistula on 2/7. PMH colon diverticulitis with perforation, prostate cancer with hx of radiation treatments, lumbar surgery, prostatectomy   OT comments  Pt making good progress with functional goals. Session focused on LB ADLs/selfcare with A/E education with handout provided. OT will continue to follow acutely  Follow Up Recommendations  Home health OT    Equipment Recommendations  Other (comment)(ADL A/E kit)    Recommendations for Other Services      Precautions / Restrictions Precautions Precautions: Fall;Other (comment) Precaution Comments: abdominal incision, colostomy, VAC Restrictions Weight Bearing Restrictions: No       Mobility Bed Mobility Overal bed mobility: Modified Independent Bed Mobility: Rolling;Sidelying to Sit           General bed mobility comments: pt in recliner upon OT arrival  Transfers Overall transfer level: Needs assistance Equipment used: None Transfers: Sit to/from Stand Sit to Stand: Supervision         General transfer comment: supervision for lines    Balance Overall balance assessment: No apparent balance deficits (not formally assessed) Sitting-balance support: Feet supported;No upper extremity supported Sitting balance-Leahy Scale: Good     Standing balance support: Single extremity supported;During functional activity Standing balance-Leahy Scale: Poor                             ADL either performed or assessed with clinical judgement   ADL Overall ADL's : Needs assistance/impaired Eating/Feeding: Independent   Grooming: Set up;Supervision/safety;Standing   Upper Body Bathing: Supervision/ safety;Set up;Sitting Upper Body Bathing  Details (indicate cue type and reason): simulated Lower Body Bathing: Moderate assistance;Sitting/lateral leans   Upper Body Dressing : Set up;Supervision/safety;Sitting     Lower Body Dressing Details (indicate cue type and reason): presented with ADL A/E education and demo, pt seems very iterested in it for home use Toilet Transfer: Supervision/safety;Ambulation   Toileting- Clothing Manipulation and Hygiene: Supervision/safety         General ADL Comments: session focused on LB ADLs/selfcare using A/E     Vision Baseline Vision/History: No visual deficits Patient Visual Report: No change from baseline     Perception     Praxis      Cognition Arousal/Alertness: Awake/alert Behavior During Therapy: WFL for tasks assessed/performed Overall Cognitive Status: Within Functional Limits for tasks assessed                                          Exercises General Exercises - Lower Extremity Long Arc Quad: AROM;Both;Seated;20 reps Hip Flexion/Marching: AROM;Both;Seated;20 reps   Shoulder Instructions       General Comments      Pertinent Vitals/ Pain       Pain Assessment: 0-10 Pain Score: 5  Pain Location: abdomen Pain Descriptors / Indicators: Aching;Discomfort;Guarding;Sore Pain Intervention(s): Monitored during session;Repositioned  Home Living                                          Prior Functioning/Environment  Frequency  Min 2X/week        Progress Toward Goals  OT Goals(current goals can now be found in the care plan section)  Progress towards OT goals: Progressing toward goals     Plan Discharge plan needs to be updated;Frequency remains appropriate    Co-evaluation                 AM-PAC OT "6 Clicks" Daily Activity     Outcome Measure   Help from another person eating meals?: None Help from another person taking care of personal grooming?: None Help from another person  toileting, which includes using toliet, bedpan, or urinal?: A Little Help from another person bathing (including washing, rinsing, drying)?: A Little Help from another person to put on and taking off regular upper body clothing?: None Help from another person to put on and taking off regular lower body clothing?: A Lot 6 Click Score: 20    End of Session    OT Visit Diagnosis: Unsteadiness on feet (R26.81);Muscle weakness (generalized) (M62.81);Pain Pain - part of body: (abdomen)   Activity Tolerance Patient tolerated treatment well   Patient Left in chair;with call bell/phone within reach   Nurse Communication          Time: 7341-9379 OT Time Calculation (min): 28 min  Charges: OT General Charges $OT Visit: 1 Visit OT Treatments $Self Care/Home Management : 8-22 mins $Therapeutic Activity: 8-22 mins     Britt Bottom 02/04/2020, 1:55 PM

## 2020-02-04 NOTE — Progress Notes (Signed)
Physical Therapy Treatment Patient Details Name: Juan Richards MRN: [id number] DOB: 09-Apr-1956 Today's Date: 02/04/2020    History of Present Illness Pt is a 64 y/o male admitted with bowel performation s/p ex lap with R hemicolectomy, end ileostomy, and mucous fistula on 2/7. PMH colon diverticulitis with perforation, prostate cancer with hx of radiation treatments, lumbar surgery, prostatectomy    PT Comments    Pt initially upset on therapist arrival stating he is frustrated with not know when he will have help to move around he is choosing to not eat or take pain medicine in anticipation of being ready for therapy on arrival. Educated pt for plan for this week and documented need for communication with pt prior to sessions to ease his anxiety over lack of schedule. Pt with significantly improved mobility without physical assist required for transfers or gait and improved activity tolerance. Pt encouraged to have nursing disconnect lines and that he is safe to walk on his own with iv at this time. Pt also educated for importance of lying flat and stretching skin to normal movement to prevent scar tissue and tightening of abdomen.     Follow Up Recommendations  Home health PT     Equipment Recommendations  3in1 (PT)    Recommendations for Other Services       Precautions / Restrictions Precautions Precautions: Fall;Other (comment) Precaution Comments: abdominal incision, colostomy, VAC    Mobility  Bed Mobility Overal bed mobility: Modified Independent Bed Mobility: Rolling;Sidelying to Sit           General bed mobility comments: HOB 30 degrees with use of rail pt able to roll to right and rise without assist  Transfers Overall transfer level: Needs assistance   Transfers: Sit to/from Stand Sit to Stand: Supervision         General transfer comment: supervision for lines  Ambulation/Gait Ambulation/Gait assistance: Supervision Gait Distance (Feet): 500  Feet Assistive device: IV Pole Gait Pattern/deviations: Step-through pattern;Decreased stride length   Gait velocity interpretation: 1.31 - 2.62 ft/sec, indicative of limited community ambulator General Gait Details: pt without noted instability with preference for holding IV pole with decreased stride and speed from baseline   Stairs Stairs: Yes Stairs assistance: Supervision Stair Management: Alternating pattern;Forwards;One rail Left Number of Stairs: 8 General stair comments: pt able to perform stairs with rail without assist, supervision for lines only   Wheelchair Mobility    Modified Rankin (Stroke Patients Only)       Balance Overall balance assessment: No apparent balance deficits (not formally assessed)                                          Cognition Arousal/Alertness: Awake/alert Behavior During Therapy: WFL for tasks assessed/performed Overall Cognitive Status: Within Functional Limits for tasks assessed                                        Exercises General Exercises - Lower Extremity Long Arc Quad: AROM;Both;Seated;20 reps Hip Flexion/Marching: AROM;Both;Seated;20 reps    General Comments        Pertinent Vitals/Pain Pain Score: 5  Pain Location: abdomen Pain Descriptors / Indicators: Aching;Discomfort;Guarding;Sore Pain Intervention(s): Limited activity within patient's tolerance;Monitored during session;Repositioned    Home Living  Prior Function            PT Goals (current goals can now be found in the care plan section) Progress towards PT goals: Progressing toward goals    Frequency           PT Plan Current plan remains appropriate    Co-evaluation              AM-PAC PT "6 Clicks" Mobility   Outcome Measure  Help needed turning from your back to your side while in a flat bed without using bedrails?: A Little Help needed moving from lying on your  back to sitting on the side of a flat bed without using bedrails?: A Little Help needed moving to and from a bed to a chair (including a wheelchair)?: A Little Help needed standing up from a chair using your arms (e.g., wheelchair or bedside chair)?: None Help needed to walk in hospital room?: None Help needed climbing 3-5 steps with a railing? : None 6 Click Score: 21    End of Session   Activity Tolerance: Patient tolerated treatment well Patient left: in chair;with call bell/phone within reach Nurse Communication: Mobility status PT Visit Diagnosis: Difficulty in walking, not elsewhere classified (R26.2);Muscle weakness (generalized) (M62.81)     Time: [id number] PT Time Calculation (min) (ACUTE ONLY): 25 min  Charges:  $Gait Training: 8-22 mins $Therapeutic Exercise: 8-22 mins                     Sua Spadafora P, PT Acute Rehabilitation Services Pager: [phone] Office: [place] 02/04/2020, 1:15 PM

## 2020-02-04 NOTE — Progress Notes (Signed)
Nutrition Follow-up  DOCUMENTATION CODES:   Non-severe (moderate) malnutrition in context of chronic illness  INTERVENTION:   -Continue MVI with minerals daily -Continue Magic cup TID with meals, each supplement provides 290 kcal and 9 grams of protein -D/c Ensure Enlive po BID, each supplement provides 350 kcal and 20 grams of protein -Glucerna Shake po BID, each supplement provides 220 kcal and 10 grams of protein -Ensure Max po daily, each supplement provides 150 kcal and 30 grams of protein  NUTRITION DIAGNOSIS:   Moderate Malnutrition related to chronic illness(diverticulitis with perforated cecum) as evidenced by energy intake < 75% for > or equal to 1 month, mild fat depletion, mild muscle depletion, moderate muscle depletion, percent weight loss.  Ongoing  GOAL:   Patient will meet greater than or equal to 90% of their needs  Progressing    MONITOR:   PO intake, Supplement acceptance, Diet advancement, Labs, Weight trends, Skin, I & O's  REASON FOR ASSESSMENT:   Consult Assessment of nutrition requirement/status, Poor PO  ASSESSMENT:   Juan Richards is a 64 y.o. male with medical history significant of prostate cancer status post robotic assisted prostatectomy 2019, radiation therapy completed in November 2020.  Patient has history of prediabetes.  Last colonoscopy was September 17, 2019 with findings of diverticulosis without diverticulitis, sigmoid stricture was noted.  She had a PET scan October 15, 2019 - for any metastatic disease but positive for sigmoid diverticulitis and small contained perforation.  He has been treated with Flagyl and Cipro for that finding.  He was seen in GI clinic on 01/24/2020 for complaint of abdominal distention, loss of appetite, and constipation.  Routine laboratory was ordered.  He was given a regimen for constipation. Abd films were ordered but report not finalized. On review there is classic "stack of coins" sign c/w SBO. Because of  continued pain and distention her presents to [facility] for further evaluation.  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   Case discussed with RN, who reports pt states that supplement tastes "too sweet". Per RN, pt has been given both boost Breeze and Ensure today and is unsure which one pt dislikes.   Spoke with pt, who had flat affect today. He expresses frustration over abdominal pain and decreased functional mobility. He shares with this RD that he worked with PT on stairs earlier today and feels comfortable, however, this activity was strenuous for him. His biggest complaint is pain from abdominal incision/ wound vac.   Pt reports good tolerance of diet and is trying to eat what he can. Observed pt during lunch- he consumed about 75% of meal (100% of fish and rice, 25% of slaw). Pt shares the Boost Breeze is too sweet. Noted Ensure was ordered yesterday, but pt reports not being given this supplement yet. Discussed importance of good meal and supplement intake to promote post-operative healing. Pt hesitant to try Ensure and  is looking for "a lower sugar option that the doctor talked about". Reviewed available options on formulary- pt amenable to Glucerna.   Labs reviewed: Na: 131, K: 3.4, CBGS: 106-119.   Diet Order:   Diet Order            Diet regular Room  service appropriate? Yes; Fluid consistency: Thin  Diet effective now              EDUCATION NEEDS:   Not appropriate for education at this time  Skin:  Skin Assessment: Skin Integrity Issues: Skin Integrity Issues:: Wound VAC Wound Vac: abdomen Incisions: closed abdomen  Last BM:  02/04/20 (150 ml output via ileostomy)  Height:   Ht Readings from Last 1 Encounters:  01/25/20 5\' 9"  (1.753 m)    Weight:   Wt Readings from Last 1 Encounters:  01/25/20 87.2 kg    Ideal Body Weight:  72.7 kg  BMI:  Body mass index is 28.39 kg/m.  Estimated Nutritional Needs:   Kcal:  [id number]  Protein:  130-145 grams  Fluid:  > 2.3 L    Loistine Chance, RD, LDN, [place] Registered Dietitian II Certified Diabetes Care and Education Specialist Please refer to [facility] for RD and/or RD on-call/weekend/after hours pager

## 2020-02-04 NOTE — Care Management (Signed)
    Durable Medical Equipment  (From admission, onward)         Start     Ordered   02/04/20 1406  For home use only DME Hospital bed  Once    Question Answer Comment  Length of Need 6 Months   Patient has (list medical condition): Bowel perforation   The above medical condition requires: Patient requires the ability to reposition frequently   Head must be elevated greater than: 45 degrees   Bed type Semi-electric   Support Surface: Gel Overlay      02/04/20 1406

## 2020-02-04 NOTE — Progress Notes (Signed)
PROGRESS NOTE    KERI HOELZLE  [id number] DOB: 1956/09/07 DOA: 01/25/2020 PCP: [name], MD   Brief Narrative:  64 year old with history of metastatic prostate cancer status post prostatectomy, prediabetes, diverticulitis, diverticulosis was treated outpatient for diverticulitis but due to worsening abdominal pain and distention he was seen in the ER on 2/6.  Admitted for bowel obstruction, diverticulitis and colovesical fistula.  Initially managed conservatively with fluids, bowel rest and antibiotics but developed perforation requiring emergent surgery.  Found to have carcinomatosis, perforated cecum and multiple adhesions.  Underwent right hemicolectomy with end ileostomy and mucous fistula of transverse colon.  Postop course complicated by ileus.  Received perioperative Ancef followed by Zosyn.   Assessment & Plan:   Principal Problem:   Bowel perforation (HCC) Active Problems:   UTI (urinary tract infection)   Pre-diabetes   Prostate cancer ([place])   Diverticulitis of large intestine with perforation   SBO (small bowel obstruction) (HCC)   Sepsis ([place])   Neutropenia with fever ([place])   Colovesical fistula   Palliative care by specialist   Acute blood loss anemia   Hyponatremia   Protein-calorie malnutrition, moderate (HCC)   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[place])  Perforated cecum with sigmoid diverticulitis Abdominal carcinomatosis with multiple lesions, appendiceal cancer.  Stage IV. Postop ileus -Underwent extensive exploratory laparotomy, right hemicolectomy with end ileostomy, mucous fistula of transverse colon on 2/7.  Completed course of antibiotics with Ancef and Zosyn. -Followed by general surgery.  Advance diet as tolerated.  Out of bed to chair.  Sepsis, present on admission.  Multifactorial with UTI and bowel perforation -Completed antibiotic course.  Conservative management at this time.  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  Further management per their team. -Palliative care-full scope of treatment at this time.  Acute on chronic blood loss anemia -Hemoglobin at baseline between 8-9.  Continue to monitor  Generalized deconditioning with moderate protein calorie malnutrition -Supportive care.  Advised to increase nutritional intake -Dietary following  DVT prophylaxis: Lovenox Code Status: Full Family Communication: None Disposition Plan:   Patient From= home  Patient Anticipated D/C place= to be determined  Barriers= currently undergoing postop management for ileus.  Slowly advancing diet as tolerated.  Once this has been achieved, he will be transition likely home.  Subjective: Patient little bit frustrated regarding not being able to move around and staff not available several times a day whenever he wishes to move around.  I explained to him that it would be best if he can work with the staff and figure out at this time they can work with him.  Also poor oral intake, reporting of very sugary supplement which I requested nursing staff to change it to Glucerna or request dietitian.  Review of Systems Otherwise negative except as per HPI, including: General: Denies fever, chills, night sweats or unintended weight loss. Resp: Denies cough, wheezing, shortness of breath. Cardiac: Denies chest pain, palpitations, orthopnea, paroxysmal nocturnal dyspnea. GI: Denies abdominal pain, nausea, vomiting, diarrhea or constipation GU: Denies dysuria, frequency, hesitancy or incontinence MS: Denies muscle aches, joint pain or swelling Neuro: Denies headache, neurologic deficits (focal weakness, numbness, tingling), abnormal gait Psych: Denies anxiety, depression, SI/HI/AVH Skin: Denies new rashes or lesions ID: Denies sick contacts, exotic exposures, travel  Examination:  General exam: Appears calm and comfortable  Respiratory system: Clear to auscultation. Respiratory effort  normal. Cardiovascular system: S1 & S2 heard, RRR. No JVD, murmurs, rubs, gallops or clicks. No pedal edema. Gastrointestinal system: Abdominal  dressing noted, diminished sounds.  Ostomy bag noted.  Some air in it. Central nervous system: Alert and oriented. No focal neurological deficits. Extremities: Symmetric 5 x 5 power. Skin: No rashes, lesions or ulcers Psychiatry: Judgement and insight appear normal. Mood & affect appropriate.     Objective: Vitals:   02/03/20 1300 02/03/20 1502 02/03/20 2035 02/04/20 0537  BP: 138/89 138/89 113/76 120/75  Pulse: 88 89 82 71  Resp: 18 18 16 18   Temp: 98.9 F (37.2 C) 98.9 F (37.2 C) 97.7 F (36.5 C) 98.5 F (36.9 C)  TempSrc: Oral Oral Oral Oral  SpO2: 100% 100% 98% 100%  Weight:      Height:        Intake/Output Summary (Last 24 hours) at 02/04/2020 0839 Last data filed at 02/04/2020 0801 Gross per 24 hour  Intake 900 ml  Output 1450 ml  Net -550 ml   Filed Weights   01/25/20 1327  Weight: 87.2 kg     Data Reviewed:   CBC: Recent Labs  Lab 01/29/20 0133 01/29/20 0133 01/30/20 0230 01/30/20 0230 01/31/20 0411 01/31/20 0411 01/31/20 1303 02/01/20 0537 02/02/20 0255 02/03/20 0701 02/04/20 0117  WBC 3.6*   < > 2.4*   < > 4.1   < > 5.8 5.7 5.2 5.4 5.1  NEUTROABS 2.9  --  1.7  --  2.9  --   --   --   --   --   --   HGB 9.0*   < > 9.1*   < > 8.0*   < > 9.2* 8.2* 8.4* 8.9* 8.7*  HCT 27.5*   < > 27.5*   < > 24.8*   < > 27.6* 26.0* 26.4* 27.2* 26.6*  MCV 91.7   < > 91.4   < > 92.2   < > 90.8 92.9 93.0 90.1 91.7  PLT 287   < > 285   < > 307   < > 341 336 390 472* 522*   < > = values in this interval not displayed.   Basic Metabolic Panel: Recent Labs  Lab 01/31/20 0411 02/01/20 0537 02/02/20 0255 02/03/20 0701 02/04/20 0117  NA 137 136 135 133* 131*  K 3.5 3.9 3.8 3.7 3.4*  CL 103 104 101 96* 97*  CO2 25 23 25 26 23   GLUCOSE 96 91 101* 106* 102*  BUN 12 9 7* 8 12  CREATININE 0.75 0.73 0.62 0.65 0.82  CALCIUM  8.7* 8.7* 8.9 9.1 9.1  MG  --   --  1.7 1.8  --   PHOS  --   --  3.1 3.5  --    GFR: Estimated Creatinine Clearance: 99.5 mL/min (by C-G formula based on SCr of 0.82 mg/dL). Liver Function Tests: Recent Labs  Lab 02/02/20 0255 02/03/20 0701  AST 37 68*  ALT 34 81*  ALKPHOS 82 105  BILITOT 0.7 0.9  PROT 6.2* 6.4*  ALBUMIN 2.0* 2.1*   No results for input(s): LIPASE, AMYLASE in the last 168 hours. No results for input(s): AMMONIA in the last 168 hours. Coagulation Profile: No results for input(s): INR, PROTIME in the last 168 hours. Cardiac Enzymes: No results for input(s): CKTOTAL, CKMB, CKMBINDEX, TROPONINI in the last 168 hours. BNP (last 3 results) No results for input(s): PROBNP in the last 8760 hours. HbA1C: No results for input(s): HGBA1C in the last 72 hours. CBG: Recent Labs  Lab 02/03/20 0802 02/03/20 1210 02/03/20 1708 02/03/20 2033 02/04/20 0759  GLUCAP 100* 111*  119* 112* 106*   Lipid Profile: No results for input(s): CHOL, HDL, LDLCALC, TRIG, CHOLHDL, LDLDIRECT in the last 72 hours. Thyroid Function Tests: No results for input(s): TSH, T4TOTAL, FREET4, T3FREE, THYROIDAB in the last 72 hours. Anemia Panel: No results for input(s): VITAMINB12, FOLATE, FERRITIN, TIBC, IRON, RETICCTPCT in the last 72 hours. Sepsis Labs: No results for input(s): PROCALCITON, LATICACIDVEN in the last 168 hours.  Recent Results (from the past 240 hour(s))  Urine culture     Status: Abnormal   Collection Time: 01/25/20 11:19 AM   Specimen: Urine, Random  Result Value Ref Range Status   Specimen Description URINE, RANDOM  Final   Special Requests   Final    NONE Performed at [facility], [address], [place], [place] [address]    Culture 70,000 COLONIES/mL ESCHERICHIA COLI (A)  Final   Report Status 01/27/2020 FINAL  Final   Organism ID, Bacteria ESCHERICHIA COLI (A)  Final      Susceptibility   Escherichia coli - MIC*    AMPICILLIN >=32 RESISTANT Resistant      CEFAZOLIN <=4 SENSITIVE Sensitive     CEFTRIAXONE <=0.25 SENSITIVE Sensitive     CIPROFLOXACIN <=0.25 SENSITIVE Sensitive     GENTAMICIN >=16 RESISTANT Resistant     IMIPENEM <=0.25 SENSITIVE Sensitive     NITROFURANTOIN <=16 SENSITIVE Sensitive     TRIMETH/SULFA >=320 RESISTANT Resistant     AMPICILLIN/SULBACTAM >=32 RESISTANT Resistant     PIP/TAZO <=4 SENSITIVE Sensitive     * 70,000 COLONIES/mL ESCHERICHIA COLI  SARS CORONAVIRUS 2 (TAT 6-24 HRS) Nasopharyngeal Nasopharyngeal Swab     Status: None   Collection Time: 01/25/20 11:25 AM   Specimen: Nasopharyngeal Swab  Result Value Ref Range Status   SARS Coronavirus 2 NEGATIVE NEGATIVE Final    Comment: (NOTE) SARS-CoV-2 target nucleic acids are NOT DETECTED. The SARS-CoV-2 RNA is generally detectable in upper and lower respiratory specimens during the acute phase of infection. Negative results do not preclude SARS-CoV-2 infection, do not rule out co-infections with other pathogens, and should not be used as the sole basis for treatment or other patient management decisions. Negative results must be combined with clinical observations, patient history, and epidemiological information. The expected result is Negative. Fact Sheet for Patients: SugarRoll.be Fact Sheet for Healthcare Providers: https://www.woods-mathews.com/ This test is not yet approved or cleared by the [place] FDA and  has been authorized for detection and/or diagnosis of SARS-CoV-2 by FDA under an Emergency Use Authorization (EUA). This EUA will remain  in effect (meaning this test can be used) for the duration of the COVID-19 declaration under Section 56 4(b)(1) of the Act, 21 U.S.C. section 360bbb-3(b)(1), unless the authorization is terminated or revoked sooner. Performed at [facility], [place] [address] [place], [place] [address]   MRSA PCR Screening     Status: None   Collection Time: 01/26/20  9:32  AM   Specimen: Nasal Mucosa; Nasopharyngeal  Result Value Ref Range Status   MRSA by PCR NEGATIVE NEGATIVE Final    Comment:        The GeneXpert MRSA Assay (FDA approved for NASAL specimens only), is one component of a comprehensive MRSA colonization surveillance program. It is not intended to diagnose MRSA infection nor to guide or monitor treatment for MRSA infections. Performed at [facility], [place] [address] [place],  [address]   Aerobic/Anaerobic Culture (surgical/deep wound)     Status: None   Collection Time: 01/26/20  1:16 PM   Specimen: Other Source; Body Fluid  Result Value Ref Range Status   Specimen Description WOUND ABDOMEN FLUID  Final   Special Requests PATIENT ON FOLLOWING ZOSYN  Final   Gram Stain   Final    MODERATE WBC PRESENT,BOTH PMN AND MONONUCLEAR NO ORGANISMS SEEN    Culture   Final    RARE ESCHERICHIA COLI RARE CLOSTRIDIUM TERTIUM WITHIN MIXED ANAEROBIC ORGANISMS Performed at [facility], [place] [address] [place], [place] [address]    Report Status 01/29/2020 FINAL  Final   Organism ID, Bacteria ESCHERICHIA COLI  Final      Susceptibility   Escherichia coli - MIC*    AMPICILLIN >=32 RESISTANT Resistant     CEFAZOLIN <=4 SENSITIVE Sensitive     CEFEPIME <=0.12 SENSITIVE Sensitive     CEFTAZIDIME <=1 SENSITIVE Sensitive     CEFTRIAXONE <=0.25 SENSITIVE Sensitive     CIPROFLOXACIN <=0.25 SENSITIVE Sensitive     GENTAMICIN >=16 RESISTANT Resistant     IMIPENEM <=0.25 SENSITIVE Sensitive     TRIMETH/SULFA >=320 RESISTANT Resistant     AMPICILLIN/SULBACTAM >=32 RESISTANT Resistant     PIP/TAZO <=4 SENSITIVE Sensitive     * RARE ESCHERICHIA COLI         Radiology Studies: No results found.      Scheduled Meds: . acetaminophen  1,000 mg Oral Q8H  . enoxaparin (LOVENOX) injection  40 mg Subcutaneous Q24H  . feeding supplement (ENSURE ENLIVE)  237 mL Oral TID BM  . insulin aspart  0-15 Units Subcutaneous TID WC  .  lip balm  1 application Topical BID  . multivitamin with minerals  1 tablet Oral Daily  . pantoprazole (PROTONIX) IV  40 mg Intravenous Q24H   Continuous Infusions:   LOS: 10 days   Time spent= 25 mins    Emon Miggins Arsenio Loader, MD [facility]  If 7PM-7AM, please contact night-coverage  02/04/2020, 8:39 AM

## 2020-02-05 DIAGNOSIS — K631 Perforation of intestine (nontraumatic): Secondary | ICD-10-CM

## 2020-02-05 LAB — COMPREHENSIVE METABOLIC PANEL
ALT: 112 U/L — ABNORMAL HIGH (ref 0–44)
AST: 73 U/L — ABNORMAL HIGH (ref 15–41)
Albumin: 2.2 g/dL — ABNORMAL LOW (ref 3.5–5.0)
Alkaline Phosphatase: 108 U/L (ref 38–126)
Anion gap: 8 (ref 5–15)
BUN: 7 mg/dL — ABNORMAL LOW (ref 8–23)
CO2: 24 mmol/L (ref 22–32)
Calcium: 9.1 mg/dL (ref 8.9–10.3)
Chloride: 102 mmol/L (ref 98–111)
Creatinine, Ser: 0.69 mg/dL (ref 0.61–1.24)
GFR calc Af Amer: >60 mL/min (ref >60)
GFR calc non Af Amer: >60 mL/min (ref >60)
Glucose, Bld: 107 mg/dL — ABNORMAL HIGH (ref 70–99)
Potassium: 3.9 mmol/L (ref 3.5–5.1)
Sodium: 134 mmol/L — ABNORMAL LOW (ref 135–145)
Total Bilirubin: 0.8 mg/dL (ref 0.3–1.2)
Total Protein: 6.3 g/dL — ABNORMAL LOW (ref 6.5–8.1)

## 2020-02-05 LAB — MAGNESIUM: Magnesium: 1.5 mg/dL — ABNORMAL LOW (ref 1.7–2.4)

## 2020-02-05 LAB — GLUCOSE, CAPILLARY
Glucose-Capillary: 103 mg/dL — ABNORMAL HIGH (ref 70–99)
Glucose-Capillary: 97 mg/dL (ref 70–99)
Glucose-Capillary: 113 mg/dL — ABNORMAL HIGH (ref 70–99)
Glucose-Capillary: 97 mg/dL (ref 70–99)

## 2020-02-05 MED ORDER — MAGNESIUM SULFATE 4 GM/100ML IV SOLN
4.0000 g | Freq: Once | INTRAVENOUS | Status: AC
  Administered 2020-02-05: 4 g via INTRAVENOUS
  Filled 2020-02-05: qty 100

## 2020-02-05 MED ORDER — PANTOPRAZOLE SODIUM 40 MG PO TBEC
40.0000 mg | DELAYED_RELEASE_TABLET | Freq: Every day | ORAL | Status: DC
  Administered 2020-02-05 – 2020-02-07 (×3): 40 mg via ORAL
  Filled 2020-02-05 (×3): qty 1

## 2020-02-05 NOTE — Discharge Instructions (Signed)
[facility]      [facility] °[phone] ° °OPEN ABDOMINAL SURGERY: POST OP INSTRUCTIONS ° °Always review your discharge instruction sheet given to you by the facility where your surgery was performed. ° °IF YOU HAVE DISABILITY OR FAMILY LEAVE FORMS, YOU MUST BRING THEM TO THE OFFICE FOR PROCESSING.  PLEASE DO NOT GIVE THEM TO YOUR DOCTOR. ° °1. A prescription for pain medication may be given to you upon discharge.  Take your pain medication as prescribed, if needed.  If narcotic pain medicine is not needed, then you may take acetaminophen (Tylenol) or ibuprofen (Advil) as needed. °2. Take your usually prescribed medications unless otherwise directed. °3. If you need a refill on your pain medication, please contact your pharmacy. They will contact our office to request authorization.  Prescriptions will not be filled after 5pm or on week-ends. °4. You should follow a light diet the first few days after arrival home, such as soup and crackers, pudding, etc.unless your doctor has advised otherwise. A high-fiber, low fat diet can be resumed as tolerated.   Be sure to include lots of fluids daily. Most patients will experience some swelling and bruising on the chest and neck area.  Ice packs will help.  Swelling and bruising can take several days to resolve °5. Most patients will experience some swelling and bruising in the area of the incision. Ice pack will help. Swelling and bruising can take several days to resolve..  °6. It is common to experience some constipation if taking pain medication after surgery.  Increasing fluid intake and taking a stool softener will usually help or prevent this problem from occurring.  A mild laxative (Milk of Magnesia or Miralax) should be taken according to package directions if there are no bowel movements after 48 hours. °7.  You may have steri-strips (small skin tapes) in place directly over the incision.  These strips should be left on the skin for 7-10 days.  If your  surgeon used skin glue on the incision, you may shower in 24 hours.  The glue will flake off over the next 2-3 weeks.  Any sutures or staples will be removed at the office during your follow-up visit. You may find that a light gauze bandage over your incision may keep your staples from being rubbed or pulled. You may shower and replace the bandage daily. °8. ACTIVITIES:  You may resume regular (light) daily activities beginning the next day--such as daily self-care, walking, climbing stairs--gradually increasing activities as tolerated.  You may have sexual intercourse when it is comfortable.  Refrain from any heavy lifting or straining until approved by your doctor. °a. You may drive when you no longer are taking prescription pain medication, you can comfortably wear a seatbelt, and you can safely maneuver your car and apply brakes °b. Return to Work: ___________________________________ °9. You should see your doctor in the office for a follow-up appointment approximately two weeks after your surgery.  Make sure that you call for this appointment within a day or two after you arrive home to insure a convenient appointment time. °OTHER INSTRUCTIONS:  °_____________________________________________________________ °_____________________________________________________________ ° °WHEN TO CALL YOUR DOCTOR: °1. Fever over 101.0 °2. Inability to urinate °3. Nausea and/or vomiting °4. Extreme swelling or bruising °5. Continued bleeding from incision. °6. Increased pain, redness, or drainage from the incision. °7. Difficulty swallowing or breathing °8. Muscle cramping or spasms. °9. Numbness or tingling in hands or feet or around lips. ° °The clinic staff is available to   answer your questions during regular business hours.  Please dont hesitate to call and ask to speak to one of the nurses if you have concerns.  For further questions, please visit [email or web address]    Negative Pressure Wound Therapy Home  Guide Negative pressure wound therapy (NPWT) uses a sponge or foam-like material (dressing) placed on or inside the wound. The wound is then covered and sealed with a cover dressing that sticks to your skin (is adhesive). This keeps air out. A tube is attached to the cover dressing, and this tube connects to a small pump. The pump sucks fluid and germs from the wound. NPWT helps to increase blood flow to the wound and heal it from the inside. What are the risks? NPWT is usually safe to use. However, problems can occur, including:  Skin irritation from the dressing adhesive.  Bleeding.  Infection.  Dehydration. Wounds with large amounts of drainage can cause excessive fluid loss.  Pain. Supplies needed:  A disposable garbage bag.  Soap and water, or hand sanitizer.  Wound cleanser or salt-water solution (saline).  New sponge and cover dressing.  Protective clothing.  Gauze pad.  Vinyl gloves.  Tape.  Skin protectant. This may be a wipe, film, or spray.  Clean or germ-free (sterile) scissors.  Eye protection. How to change your dressing Prepare to change your dressing  1. If told by your health care provider, take pain medicine 30 minutes before changing the dressing. 2. Wash your hands with soap and water. Dry your hands with a clean towel. If soap and water are not available, use hand sanitizer. 3. Set up a clean station for wound care. 4. Open the dressing package so that the sponge dressing remains on the inside of the package. 5. Wear gloves, protective clothing, and eye protection. Remove old dressing  1. Turn off the pump and disconnect the tubing from the dressing. 2. Carefully remove the adhesive cover dressing in the direction of your hair growth. 3. Remove the sponge dressing that is inside the wound. If the sponge sticks, use a wound cleanser or saline solution to wet the sponge and help it come off more easily. 4. Throw the old sponge and cover dressing  supplies into the garbage bag. 5. Remove your gloves by grabbing the cuff and turning the glove inside out. Place the gloves in the trash immediately. 6. Wash your hands with soap and water. Dry your hands with a clean towel. If soap and water are not available, use hand sanitizer. Clean your wound  Wear gloves, protective clothing, and eye protection. Follow your health care provider's instructions on how to clean your wound. You may be told to: 1. Clean the wound using a saline solution or a wound cleanser and a clean gauze pad. 2. Pat the wound dry with a gauze pad. Do not rub the wound. 3. Throw the gauze pad into the garbage bag. 4. Remove your gloves by grabbing the cuff and turning the glove inside out. Place the gloves in the trash immediately. 5. Wash your hands with soap and water. Dry your hands with a clean towel. If soap and water are not available, use hand sanitizer. Apply new dressing  Wear gloves, protective clothing, and eye protection. 1. If told by your health care provider, apply a skin protectant to any skin that will be exposed to adhesive. Let the skin protectant dry. 2. Cut a piece of new sponge dressing and put it on or in the wound. 3. Using  clean scissors, cut a nickel-sized hole in the new cover dressing. 4. Apply the cover dressing. 5. Attach the suction tube over the hole in the cover dressing. 6. Take off your gloves. Put them in the plastic bag with the old dressing. Tie the bag shut and throw it away. 7. Wash your hands with soap and water. Dry your hands with a clean towel. If soap and water are not available, use hand sanitizer. 8. Turn the pump back on. The sponge dressing should collapse. Do not change the settings on the machine without talking to a health care provider. 9. Replace the container in the pump that collects fluid if it is full. Replace the container per the manufacturer's instructions or at least once a week, even if it is not full. General  tips and recommendations If the alarm sounds:  Stay calm.  Do not turn off the pump or do anything with the dressing.  Reasons the alarm may go off: ? The battery is low. Change the battery or plug the device into electrical power. ? The dressing has a leak. Find the leak and put tape over the leak. ? The fluid collection container is full. Change the fluid container.  Call your health care provider right away if you cannot fix the problem.  Explain to your health care provider what is happening. Follow his or her instructions. General instructions  Do not turn off the pump unless told to do so by your health care provider.  Do not turn off the pump for more than 2 hours. If the pump is off for more than 2 hours, the dressing will need to be changed.  If your health care provider says it is okay to shower: ? Do not take the pump into the shower. ? Make sure the wound dressing is protected and sealed. The wound dressing must stay dry.  Check frequently that the machine indicates that therapy is on and that all clamps are open.  Do not use over-the-counter medicated or antiseptic creams, sprays, liquids, or dressings unless your health care provider approves. Contact a health care provider if:  You have new pain.  You develop irritation, a rash, or itching around the wound or dressing.  You see new black or yellow tissue in your wound.  The dressing changes are painful or cause bleeding.  The pump has been off for more than 2 hours, and you do not know how to change the dressing.  The pump alarm goes off, and you do not know what to do. Get help right away if:  You have a lot of bleeding.  The wound breaks open.  You have severe pain.  You have signs of infection, such as: ? More redness, swelling, or pain. ? More fluid or blood. ? Warmth. ? Pus or a bad smell. ? Red streaks leading from the wound. ? A fever.  You see a sudden change in the color or texture of  the drainage.  You have signs of dehydration, such as: ? Little or no tears, urine, or sweat. ? Muscle cramps. ? Very dry mouth. ? Headache. ? Dizziness. Summary  Negative pressure wound therapy (NPWT) is a device that helps your wound heal.  Set up a clean station for wound care. Your health care provider will tell you what supplies to use.  Follow your health care provider's instructions on how to clean your wound and how to change the dressing.  Contact a health care provider if you  have new pain, an irritation, or a rash, or if the alarm goes off and you do not know what to do.  Get help right away if you have a lot of bleeding, your wound breaks open, or you have severe pain. Also, get help if you have signs of infection. This information is not intended to replace advice given to you by your health care provider. Make sure you discuss any questions you have with your health care provider. Document Revised: 03/29/2019 Document Reviewed: 02/22/2019 Elsevier Patient Education  [place], Adult  Colostomy surgery is done to create an opening in the front of the abdomen for stool (feces) to leave the body through an ostomy (stoma). Part of the large intestine is attached to the stoma. A bag, also called a pouch, is fitted over the stoma. Stool and gas will collect in the bag. After surgery, you will need to empty and change your colostomy bag as needed. You will also need to care for your stoma. How to care for the stoma Your stoma should look pink, red, and moist, like the inside of your cheek. Soon after surgery, the stoma may be swollen, but this swelling will go away within 6 weeks. To care for the stoma:  Keep the skin around the stoma clean and dry.  Use a clean, soft washcloth to gently wash the stoma and the skin around it. Clean using a circular motion, and wipe away from the stoma opening, not toward it. ? Use warm water and only use  cleansers recommended by your health care provider. ? Rinse the stoma area with plain water. ? Dry the area around the stoma well.  Use stoma powder or ointment on your skin only as told by your health care provider. Do not use any other powders, gels, wipes, or creams on the skin around the stoma.  Check the stoma area every day for signs of infection. Check for: ? New or worsening redness, swelling, or pain. ? New or increased fluid or blood. ? Pus or warmth.  Measure the stoma opening regularly and record the size. Watch for changes. (It is normal for the stoma to get smaller as swelling goes away.) Share this information with your health care provider. How to empty the colostomy bag  Empty your bag at bedtime and whenever it is one-third to one-half full. Do not let the bag get more than half-full with stool or gas. The bag could leak if it gets too full. Some colostomy bags have a built-in gas release valve that releases gas often throughout the day. Follow these basic steps: 1. Wash your hands with soap and water. 2. Sit far back on the toilet seat. 3. Put several pieces of toilet paper into the toilet water. This will prevent splashing as you empty stool into the toilet. 4. Remove the clip or the hook-and-loop fastener from the tail end of the bag. 5. Unroll the tail, then empty the stool into the toilet. 6. Clean the tail with toilet paper or a moist towelette. 7. Reroll the tail, and close it with the clip or the hook-and-loop fastener. 8. Wash your hands again. How to change the colostomy bag Change your bag every 3-4 days or as often as told by your health care provider. Also change the bag if it is leaking or separating from the skin, or if your skin around the stoma looks or feels irritated. Irritated skin may be a sign that  the bag is leaking. Always have colostomy supplies with you, and follow these basic steps: 1. Wash your hands with soap and water. Have paper towels or  tissues nearby to clean any discharge. 2. Remove the old bag and skin barrier. Use your fingers or a warm cloth to gently push the skin away from the barrier. 3. Clean the stoma area with water or with mild soap and water, as directed. Use water to rinse away any soap. 4. Dry the skin. You may use the cool setting on a hair dryer to do this. 5. Use a tracing pattern (template) to cut the skin barrier to the size needed. 6. If you are using a two-piece bag, attach the bag and the skin barrier to each other. Add the barrier ring, if you use one. 7. If directed, apply stoma powder or skin barrier gel to the skin. 8. Warm the skin barrier with your hands, or blow with a hair dryer for 5-10 seconds. 9. Remove the paper from the adhesive strip of the skin barrier. 10. Press the adhesive strip onto the skin around the stoma. 11. Gently rub the skin barrier onto the skin. This creates heat that helps the barrier to stick. 12. Apply stoma tape to the edges of the skin barrier, if desired. 81. Wash your hands again. General recommendations  Avoid wearing tight clothes or having anything press directly on your stoma or bag. Change your clothing whenever it is soiled or damp.  You may shower or bathe with the bag on or off. Do not use harsh or oily soaps or lotions. Dry the skin and bag after bathing.  Store all supplies in a cool, dry place. Do not leave supplies in extreme heat because some parts can melt or not stick as well.  Whenever you leave home, take extra clothing and an extra skin barrier and bag with you.  If your bag gets wet, you can dry it with a hair dryer on the cool setting.  To prevent odor, you may put drops of ostomy deodorizer in the bag.  If recommended by your health care provider, put ostomy lubricant inside the bag. This helps stool to slide out of the bag more easily and completely. Contact a health care provider if:  You have new or worsening redness, swelling, or pain  around your stoma.  You have new or increased fluid or blood coming from your stoma.  Your stoma feels warm to the touch.  You have pus coming from your stoma.  Your stoma extends in or out farther than normal.  You need to change your bag every day.  You have a fever. Get help right away if:  Your stool is bloody.  You have nausea or you vomit.  You have trouble breathing. Summary  Measure your stoma opening regularly and record the size. Watch for changes.  Empty your bag at bedtime and whenever it is one-third to one-half full. Do not let the bag get more than half-full with stool or gas.  Change your bag every 3-4 days or as often as told by your health care provider.  Whenever you leave home, take extra clothing and an extra skin barrier and bag with you. This information is not intended to replace advice given to you by your health care provider. Make sure you discuss any questions you have with your health care provider. Document Revised: 03/27/2019 Document Reviewed: 05/31/2017 Elsevier Patient Education  [place].

## 2020-02-05 NOTE — TOC Progression Note (Addendum)
Transition of Care [facility]) - Progression Note    Patient Details  Name: Juan Richards MRN: [id number] Date of Birth: 05/10/1956  Transition of [facility] CM/SW Contact  Alif Petrak, Edson Snowball, RN Phone Number: 02/05/2020, 10:32 AM  Clinical Narrative:     Per [facility] PA discharge is Friday. [place] with [facility] accepted referral for RN,PT,OT . She will plan on a visit from [facility] on Saturday for ostomies and then again Monday for VAC dressing change.   Once wound description including measurements received will fax to [facility]. [id number] Faxed [employer] application   Expected Discharge Plan: [place]    Expected Discharge Plan and Services Expected Discharge Plan: [place]   Discharge Planning Services: CM Consult Post Acute Care Choice: Home Health, Durable Medical Equipment Living arrangements for the past 2 months: Single Family Home                           HH Arranged: RN, PT, OT           Social Determinants of Health (SDOH) Interventions    Readmission Risk Interventions No flowsheet data found.

## 2020-02-05 NOTE — Consult Note (Signed)
[place] Nurse Consult Note: Reason for Consult:Midline abdominal incision with NPWT (VAC) therapy.  Wound type:surgical Pressure Injury POA: NA Measurement: 15 cm x 6 cm x 3 cm Wound bed: Beefy red, bleeds with care, sutures visible in wound bed Drainage (amount, consistency, odor) minimal bleeding  No odor.  Periwound:LLQ MF and RLQ ileostomy Dressing procedure/placement/frequency:Cleanse wound with NS.  Apply 1 piece place foam.  Cover with drape. Seal immediately achieved at [address] [place] Nurse ostomy follow up Stoma type/location: LLQ mucus fistula Stomal assessment/size:  Oval:  0.5 cm x 3 cm recessed stoma.  Will protect with barrier ring Peristomal assessment: intact Treatment options for stomal/peristomal skin: barrier ring and closed end pouch Output  Soft brown stool Ostomy pouching: 2pc. Flat closed end pouch with barrier ring  Education provided: watched pouch change.  Will perform ileostomy pouch change with assistance from [facility] nurse in front of bathroom mirror.  Ileostomy Care performed by patient with [facility] teaching Stoma type/location: RLQ ileostomy, well budded pink and moist  Liquid green stool Stomal assessment/size: 1 1/2" intact Peristomal assessment: intact  Treatment options for stomal/peristomal skin: barrier ring and 2 3/4" pouch Output liquid green stool Ostomy pouching: 2pc. With barrier ring  Education provided: patient removed old pouch, cleansed skin and applied barrier ring.  Cut new pouch to fit and snapped barrier and pouch together.  Applied pouch and rolled pouch closed.  Discussed twice weekly pouch changes and emptying when 1/3 full. Discussed showering with or without pouch once healed and surgery clears him to shower.   Anticipates discharge home Friday.  Enrolled patient in [place] Start Discharge program: Yes  Domenic Moras MSN, RN, FNP-BC [facility]ON Wound, Ostomy, Continence Nurse Pager [phone]

## 2020-02-05 NOTE — Progress Notes (Addendum)
10 Days Post-Op    CC: Abdominal pain  Subjective: He is actually doing better this a.m.  His mood is better, he said he had a good discussion with Dr. Benay Spice and feels quite encouraged.  Both his mucous fistula and his ileostomy are working well.  He is tolerating soft diet.  We are going to take his wound VAC down today and we can make a decision about whether to continue the wound VAC versus wet-to-dry dressings.  Objective: Vital signs in last 24 hours: Temp:  [98.1 F (36.7 C)-98.7 F (37.1 C)] 98.7 F (37.1 C) (02/17 0520) Pulse Rate:  [88-101] 88 (02/17 0520) Resp:  [17-20] 20 (02/17 0520) BP: (123-132)/(75-80) 132/80 (02/17 0520) SpO2:  [99 %-100 %] 100 % (02/17 0520) Last BM Date: 02/04/20(thru ileostomy) 10:10 PM 1598 IV 1200 urine Nothing from his drain 1125 stool -300 colostomy/825 ileostomy Afebrile vital signs are stable CMP is stable . sodium chloride 75 mL/hr at 02/05/20 0700   Intake/Output from previous day: 02/16 0701 - 02/17 0700 In: 2608.3 [P.O.:1010; I.V.:1598.3] Out: 2325 [Urine:1200; [id number] Intake/Output this shift: No intake/output data recorded.  General appearance: alert, cooperative and no distress Resp: clear to auscultation bilaterally GI: Soft, soreness is improved.  He is tolerating pain with just p.o. pain medications.  Both the mucous fistula and ileostomy are functioning well.  Midline incision has a wound VAC in place and will take it down later today. Extremities: extremities normal, atraumatic, no cyanosis or edema  Lab Results:  Recent Labs    02/03/20 0701 02/04/20 0117  WBC 5.4 5.1  HGB 8.9* 8.7*  HCT 27.2* 26.6*  PLT 472* 522*    BMET Recent Labs    02/04/20 0117 02/05/20 0131  NA 131* 134*  K 3.4* 3.9  CL 97* 102  CO2 23 24  GLUCOSE 102* 107*  BUN 12 7*  CREATININE 0.82 0.69  CALCIUM 9.1 9.1   PT/INR No results for input(s): LABPROT, INR in the last 72 hours.  Recent Labs  Lab 02/02/20 0255  02/03/20 0701 02/05/20 0131  AST 37 68* 73*  ALT 34 81* 112*  ALKPHOS 82 105 108  BILITOT 0.7 0.9 0.8  PROT 6.2* 6.4* 6.3*  ALBUMIN 2.0* 2.1* 2.2*     Lipase  No results found for: LIPASE   Medications: . acetaminophen  1,000 mg Oral Q8H  . enoxaparin (LOVENOX) injection  40 mg Subcutaneous Q24H  . feeding supplement (GLUCERNA SHAKE)  237 mL Oral BID BM  . insulin aspart  0-15 Units Subcutaneous TID WC  . lip balm  1 application Topical BID  . multivitamin with minerals  1 tablet Oral Daily  . pantoprazole (PROTONIX) IV  40 mg Intravenous Q24H  . 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 Barry Dienes   FEN: Regular ID: Ancef 2/10-2/13; Zosyn 2/6-2/10 DVT: lovenox Follow up: Barry Dienes Pain control:  Tylenol 1 g x 2, oxycodone 10 mg x 2   Plan: He is making good progress.  Tolerating regular diet.  We will do the wound dressing later this a.m. and we can start making more solid plans for discharge home.  Saline lock IV and discontinue IV  fluids.  LOS: 11 days    Shemaiah Round 02/05/2020 Please see Amion

## 2020-02-05 NOTE — Progress Notes (Addendum)
PROGRESS NOTE    Juan Richards  [id number] DOB: 1956-10-08 DOA: 01/25/2020 PCP: [name], MD   Brief Narrative:  64 year old with history of metastatic prostate cancer status post prostatectomy, prediabetes, diverticulitis, diverticulosis was treated outpatient for diverticulitis but due to worsening abdominal pain and distention he was seen in the ER on 2/6.  Admitted for bowel obstruction, diverticulitis and colovesical fistula.  Initially managed conservatively with fluids, bowel rest and antibiotics but developed perforation requiring emergent surgery.  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  Underwent right hemicolectomy with end ileostomy and mucous fistula of transverse colon.  Postop course complicated by ileus.  Received perioperative Ancef followed by Zosyn.  Subjective: Patient was feeling better when seen today.  Continues to have some abdominal pain.  Wound VAC was in place.  He was able to tolerate diet.  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  Stage IV. Postop ileus -Underwent extensive exploratory laparotomy, right hemicolectomy with end ileostomy, mucous fistula of transverse colon on 2/7.  Completed course of antibiotics with Ancef and Zosyn. -Followed by general surgery.  Advance diet as tolerated.  Out of bed to chair.  Hypomagnesemia.  Magnesium  of 1.5. -Replace magnesium and monitor.  Sepsis, present on admission.  Multifactorial with UTI and bowel perforation -Completed antibiotic course.  Conservative management at this time.  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  Further management per their team. -Palliative care-full scope of treatment at this time.  Acute on chronic blood loss anemia -Hemoglobin at baseline between 8-9.  Continue to monitor  Generalized deconditioning with moderate protein calorie malnutrition -Supportive care.  Advised to increase nutritional intake -Dietary following  Objective: Vitals:   02/04/20 0537 02/04/20 1321 02/04/20 2045 02/05/20 0520  BP: 120/75 123/76 124/75 132/80  Pulse: 71 (!) 101 96 88  Resp: 18 19 17 20   Temp: 98.5 F (36.9 C) 98.1 F (36.7 C) 98.4 F (36.9 C) 98.7 F (37.1 C)  TempSrc: Oral Oral Oral Oral  SpO2: 100% 100% 99% 100%  Weight:      Height:        Intake/Output Summary (Last 24 hours) at 02/05/2020 0838 Last data filed at 02/05/2020 0700 Gross per 24 hour  Intake 2608.34 ml  Output 2225 ml  Net 383.34 ml   Filed Weights   01/25/20 1327  Weight: 87.2 kg    Examination:  General exam: Appears calm and comfortable  Respiratory system: Clear to auscultation. Respiratory effort normal. Cardiovascular system: S1 & S2 heard, RRR. No JVD, murmurs, rubs, gallops or clicks. Gastrointestinal system: Soft, nontender, nondistended, bowel sounds positive. Central nervous system: Alert and oriented. No focal neurological deficits.Symmetric 5 x 5 power. Extremities: No edema, no cyanosis, pulses intact and symmetrical. Skin: No rashes, lesions or ulcers Psychiatry: Judgement and insight appear normal. Mood & affect appropriate.    DVT prophylaxis: Lovenox Code Status: Full Family Communication: No Family at bedside  Disposition Plan: Pending surgical clearance.  Most likely will go back home.   Procedures:   Antimicrobials:   Data Reviewed: I have personally reviewed following labs and imaging studies  CBC: Recent Labs  Lab 01/30/20 0230 01/30/20 0230 01/31/20 0411 01/31/20 0411 01/31/20 1303 02/01/20 0537 02/02/20 0255 02/03/20 0701 02/04/20 0117  WBC 2.4*   < > 4.1   < > 5.8 5.7 5.2 5.4 5.1  NEUTROABS 1.7  --  2.9  --   --   --   --   --   --   HGB 9.1*   < > 8.0*   < > 9.2* 8.2* 8.4* 8.9* 8.7*  HCT 27.5*   < > 24.8*   < > 27.6* 26.0* 26.4* 27.2* 26.6*  MCV 91.4   < > 92.2   < > 90.8 92.9 93.0 90.1 91.7  PLT 285   < > 307   < > 341 336 390 472* 522*   < > = values in this interval not displayed.   Basic Metabolic Panel: Recent Labs  Lab 02/01/20 0537 02/02/20 0255 02/03/20 0701 02/04/20 0117 02/05/20 0131  NA 136 135 133* 131* 134*  K 3.9 3.8 3.7 3.4* 3.9  CL 104 101 96* 97* 102  CO2 23 25 26 23 24   GLUCOSE 91 101* 106* 102* 107*  BUN 9 7* 8 12 7*  CREATININE 0.73 0.62 0.65 0.82 0.69  CALCIUM 8.7* 8.9 9.1 9.1 9.1  MG  --  1.7 1.8  --  1.5*  PHOS  --  3.1 3.5  --   --    GFR: Estimated Creatinine Clearance: 102 mL/min (by C-G formula based on SCr of 0.69 mg/dL). Liver Function Tests: Recent Labs  Lab 02/02/20 0255 02/03/20 0701 02/05/20 0131  AST 37 68* 73*  ALT 34 81* 112*  ALKPHOS 82 105 108  BILITOT 0.7 0.9 0.8  PROT 6.2* 6.4* 6.3*  ALBUMIN 2.0* 2.1* 2.2*   No results for input(s): LIPASE, AMYLASE in the last 168 hours. No results for input(s): AMMONIA in the last 168 hours. Coagulation Profile: No results for input(s): INR, PROTIME in the last 168 hours. Cardiac Enzymes: No results for input(s): CKTOTAL, CKMB, CKMBINDEX, TROPONINI in the last 168 hours. BNP (last 3 results) No results for input(s): PROBNP in the last 8760 hours. HbA1C: No results for input(s): HGBA1C in the last 72 hours. CBG: Recent Labs  Lab 02/04/20 0759 02/04/20 1206 02/04/20 1724 02/04/20 2055 02/05/20 0739  GLUCAP 106* 101* 82 101* 103*   Lipid Profile: No  results for input(s): CHOL, HDL, LDLCALC, TRIG, CHOLHDL, LDLDIRECT in the last 72 hours. Thyroid Function Tests: No results for input(s): TSH, T4TOTAL, FREET4, T3FREE, THYROIDAB in the last 72 hours. Anemia Panel: No results for input(s): VITAMINB12, FOLATE, FERRITIN, TIBC, IRON, RETICCTPCT in the last 72 hours. Sepsis Labs: No results for input(s): PROCALCITON, LATICACIDVEN in the last 168 hours.  Recent Results (from the past 240 hour(s))  MRSA PCR Screening     Status: None   Collection Time: 01/26/20  9:32 AM   Specimen: Nasal Mucosa; Nasopharyngeal  Result Value Ref Range Status   MRSA by PCR NEGATIVE NEGATIVE Final    Comment:        The GeneXpert MRSA Assay (FDA approved for NASAL specimens only), is one component of a comprehensive MRSA colonization surveillance program. It is not intended to diagnose MRSA infection nor to guide or monitor treatment for MRSA infections. Performed at [facility],  [address], [place], [place] [address]   Aerobic/Anaerobic Culture (surgical/deep wound)     Status: None   Collection Time: 01/26/20  1:16 PM   Specimen: Other Source; Body Fluid  Result Value Ref Range Status   Specimen Description WOUND ABDOMEN FLUID  Final   Special Requests PATIENT ON FOLLOWING ZOSYN  Final   Gram Stain   Final    MODERATE WBC PRESENT,BOTH PMN AND MONONUCLEAR NO ORGANISMS SEEN    Culture   Final    RARE ESCHERICHIA COLI RARE CLOSTRIDIUM TERTIUM WITHIN MIXED ANAEROBIC ORGANISMS Performed at [facility], [place] [address], [place], [place] [address]    Report Status 01/29/2020 FINAL  Final   Organism ID, Bacteria ESCHERICHIA COLI  Final      Susceptibility   Escherichia coli - MIC*    AMPICILLIN >=32 RESISTANT Resistant     CEFAZOLIN <=4 SENSITIVE Sensitive     CEFEPIME <=0.12 SENSITIVE Sensitive     CEFTAZIDIME <=1 SENSITIVE Sensitive     CEFTRIAXONE <=0.25 SENSITIVE Sensitive     CIPROFLOXACIN <=0.25 SENSITIVE Sensitive      GENTAMICIN >=16 RESISTANT Resistant     IMIPENEM <=0.25 SENSITIVE Sensitive     TRIMETH/SULFA >=320 RESISTANT Resistant     AMPICILLIN/SULBACTAM >=32 RESISTANT Resistant     PIP/TAZO <=4 SENSITIVE Sensitive     * RARE ESCHERICHIA COLI     Radiology Studies: No results found.  Scheduled Meds: . acetaminophen  1,000 mg Oral Q8H  . enoxaparin (LOVENOX) injection  40 mg Subcutaneous Q24H  . feeding supplement (GLUCERNA SHAKE)  237 mL Oral BID BM  . insulin aspart  0-15 Units Subcutaneous TID WC  . lip balm  1 application Topical BID  . multivitamin with minerals  1 tablet Oral Daily  . pantoprazole (PROTONIX) IV  40 mg Intravenous Q24H  . Ensure Max Protein  11 oz Oral QHS   Continuous Infusions:   LOS: 11 days   Time spent: 35 minutes.  Lorella Nimrod, MD [facility]  If 7PM-7AM, please contact night-coverage [email or web address]  02/05/2020, 8:38 AM   This record has been created using Systems analyst. Errors have been sought and corrected,but may not always be located. Such creation errors do not reflect on the standard of care.

## 2020-02-05 NOTE — Progress Notes (Signed)
The chaplain visited the patient as a follow-up. The patient was in much better spirits than the last time the chaplain visited him. The chaplain will follow-up prior to the patient departing.  Brion Aliment Chaplain Resident For questions concerning this note please contact me by pager [phone]

## 2020-02-06 ENCOUNTER — Other Ambulatory Visit: Payer: Self-pay | Admitting: Oncology

## 2020-02-06 DIAGNOSIS — D373 Neoplasm of uncertain behavior of appendix: Secondary | ICD-10-CM

## 2020-02-06 DIAGNOSIS — C61 Malignant neoplasm of prostate: Secondary | ICD-10-CM

## 2020-02-06 LAB — COMPREHENSIVE METABOLIC PANEL
ALT: 86 U/L — ABNORMAL HIGH (ref 0–44)
AST: 39 U/L (ref 15–41)
Albumin: 2.3 g/dL — ABNORMAL LOW (ref 3.5–5.0)
Alkaline Phosphatase: 93 U/L (ref 38–126)
Anion gap: 9 (ref 5–15)
BUN: 8 mg/dL (ref 8–23)
CO2: 24 mmol/L (ref 22–32)
Calcium: 8.7 mg/dL — ABNORMAL LOW (ref 8.9–10.3)
Chloride: 99 mmol/L (ref 98–111)
Creatinine, Ser: 0.63 mg/dL (ref 0.61–1.24)
GFR calc Af Amer: >60 mL/min (ref >60)
GFR calc non Af Amer: >60 mL/min (ref >60)
Glucose, Bld: 103 mg/dL — ABNORMAL HIGH (ref 70–99)
Potassium: 3.8 mmol/L (ref 3.5–5.1)
Sodium: 132 mmol/L — ABNORMAL LOW (ref 135–145)
Total Bilirubin: 0.7 mg/dL (ref 0.3–1.2)
Total Protein: 6.4 g/dL — ABNORMAL LOW (ref 6.5–8.1)

## 2020-02-06 LAB — GLUCOSE, CAPILLARY
Glucose-Capillary: 109 mg/dL — ABNORMAL HIGH (ref 70–99)
Glucose-Capillary: 103 mg/dL — ABNORMAL HIGH (ref 70–99)
Glucose-Capillary: 105 mg/dL — ABNORMAL HIGH (ref 70–99)
Glucose-Capillary: 106 mg/dL — ABNORMAL HIGH (ref 70–99)

## 2020-02-06 LAB — MAGNESIUM: Magnesium: 2.1 mg/dL (ref 1.7–2.4)

## 2020-02-06 NOTE — Progress Notes (Signed)
Brief oncology note:  Spoke with [facility] and requested MSI and Foundation 1 testing on pathology results.  Mikey Bussing, DNP, AGPCNP-BC, AOCNP

## 2020-02-06 NOTE — Progress Notes (Addendum)
Nutrition Follow-up  RD working remotely.  DOCUMENTATION CODES:   Non-severe (moderate) malnutrition in context of chronic illness  INTERVENTION:   -Continue MVI with minerals daily -Continue Magic cup TID with meals, each supplement provides 290 kcal and 9 grams of protein -Continue Glucerna Shake po BID, each supplement provides 220 kcal and 10 grams of protein -Continue Ensure Max po daily, each supplement provides 150 kcal and 30 grams of protein  NUTRITION DIAGNOSIS:   Moderate Malnutrition related to chronic illness(diverticulitis with perforated cecum) as evidenced by energy intake < 75% for > or equal to 1 month, mild fat depletion, mild muscle depletion, moderate muscle depletion, percent weight loss.  Ongoing  GOAL:   Patient will meet greater than or equal to 90% of their needs  Progressing   MONITOR:   PO intake, Supplement acceptance, Diet advancement, Labs, Weight trends, Skin, I & O's  REASON FOR ASSESSMENT:   Consult Assessment of nutrition requirement/status, Poor PO  ASSESSMENT:   Juan Richards is a 64 y.o. male with medical history significant of prostate cancer status post robotic assisted prostatectomy 2019, radiation therapy completed in November 2020.  Patient has history of prediabetes.  Last colonoscopy was September 17, 2019 with findings of diverticulosis without diverticulitis, sigmoid stricture was noted.  She had a PET scan October 15, 2019 - for any metastatic disease but positive for sigmoid diverticulitis and small contained perforation.  He has been treated with Flagyl and Cipro for that finding.  He was seen in GI clinic on 01/24/2020 for complaint of abdominal distention, loss of appetite, and constipation.  Routine laboratory was ordered.  He was given a regimen for constipation. Abd films were ordered but report not finalized. On review there is classic "stack of coins" sign c/w SBO. Because of continued pain and distention her presents to  [facility] for further evaluation.  2/7- s/pProcedure(s): Exploratory laparotomy, right hemicolectomy with end ileostomy, mucus fistula of transverse colon(operative [id number] cecum, carcinomatosis, sigmoid with dense adhesion to pelvic side wall) 2/8- NGT removed 2/9- advanced to clear liquid diet 2/10- NPO due to emesis, NGT replaced 2/12- NGT clamped, advanced to clears 2/14- NGT removed, advanced to fulls 2/15- advanced to soft diet  Reviewed I/O's: -128 ml x 24 hours and -2.4 L since admission  UOP: 850 ml x 24 hours  Drain output: 30 ml x 24 hours   Ileostomy output: 325 ml x 24 hours  Per general surgery notes, pt with metastatic adenocarcinoma of appendix and abdominal carcinomatosis.   Pt's intake has improved; noted meal completion 75-100%. He is consuming both Ensure Max and Glucerna supplements.   Per RNCM notes, plan to d/c home with wound vac.  Possible d/c home tomorrow.   Per oncology notes, plan for possible chemotherapy after he has recovered from surgery.  Labs reviewed: Na: 132, CBGS: 97-113 (inpatient orders for glycemic control are 0-15 units insulin aspart TID with meals).   Diet Order:   Diet Order            Diet regular Room service appropriate? Yes; Fluid consistency: Thin  Diet effective now              EDUCATION NEEDS:   Not appropriate for education at this time  Skin:  Skin Assessment: Skin Integrity Issues: Skin Integrity Issues:: Wound VAC Wound Vac: abdomen Incisions: closed abdomen  Last BM:  02/06/20 (via ileostomy)  Height:   Ht Readings from Last 1 Encounters:  01/25/20 5\' 9"  (1.753 m)  Weight:   Wt Readings from Last 1 Encounters:  01/25/20 87.2 kg    Ideal Body Weight:  72.7 kg  BMI:  Body mass index is 28.39 kg/m.  Estimated Nutritional Needs:   Kcal:  [id number]  Protein:  130-145 grams  Fluid:  > 2.3 L    Loistine Chance, RD, LDN, [place] Registered Dietitian II Certified Diabetes Care and  Education Specialist Please refer to [facility] for RD and/or RD on-call/weekend/after hours pager

## 2020-02-06 NOTE — Care Management (Signed)
Home [facility] will be delivered today to patient's hospital room.  Magdalen Spatz RN

## 2020-02-06 NOTE — Progress Notes (Signed)
Physical Therapy Treatment Patient Details Name: Juan Richards MRN: [id number] DOB: 08-26-1956 Today's Date: 02/06/2020    History of Present Illness Pt is a 64 y/o male admitted with bowel performation s/p ex lap with R hemicolectomy, end ileostomy, and mucous fistula on 2/7. PMH colon diverticulitis with perforation, prostate cancer with hx of radiation treatments, lumbar surgery, prostatectomy    PT Comments    Pt supine on arrival and highly agreeable to mobility stating he is ready to go home tomorrow. Pt able to walk without AD this session with assist to carry wound VAC throughout session. Pt educated again for lying flat to stretch and reports he has been unable to tolerate but has lowered HOB. PT with excellent progression and reports feeling confidant with mobility and walking more frequently. Pt in bathroom to empty ostomy end of session.    Follow Up Recommendations  Home health PT     Equipment Recommendations  3in1 (PT)    Recommendations for Other Services       Precautions / Restrictions Precautions Precautions: Other (comment) Precaution Comments: abdominal incision, colostomy, VAC Restrictions Weight Bearing Restrictions: No    Mobility  Bed Mobility Overal bed mobility: Modified Independent Bed Mobility: Rolling;Sidelying to Sit Rolling: Modified independent (Device/Increase time) Sidelying to sit: Modified independent (Device/Increase time)       General bed mobility comments: HOB 20 degrees with use of rail  Transfers Overall transfer level: Modified independent               General transfer comment: no assist needed  Ambulation/Gait Ambulation/Gait assistance: Supervision Gait Distance (Feet): 750 Feet Assistive device: None Gait Pattern/deviations: Step-through pattern;Decreased stride length   Gait velocity interpretation: >2.62 ft/sec, indicative of community ambulatory General Gait Details: pt without UE support for gait with 3  periods of noted sway with pt able to recover without assist, assist to carry VAC with pt with slightly increased gait speed from last session   Stairs             Wheelchair Mobility    Modified Rankin (Stroke Patients Only)       Balance Overall balance assessment: No apparent balance deficits (not formally assessed)                                          Cognition Arousal/Alertness: Awake/alert Behavior During Therapy: WFL for tasks assessed/performed Overall Cognitive Status: Within Functional Limits for tasks assessed                                        Exercises      General Comments        Pertinent Vitals/Pain Pain Score: 6  Pain Location: abdomen Pain Descriptors / Indicators: Aching;Discomfort;Guarding;Sore Pain Intervention(s): Limited activity within patient's tolerance;Monitored during session;Repositioned    Home Living                      Prior Function            PT Goals (current goals can now be found in the care plan section) Progress towards PT goals: Progressing toward goals    Frequency           PT Plan Current plan remains appropriate    Co-evaluation  AM-PAC PT "6 Clicks" Mobility   Outcome Measure  Help needed turning from your back to your side while in a flat bed without using bedrails?: None Help needed moving from lying on your back to sitting on the side of a flat bed without using bedrails?: None Help needed moving to and from a bed to a chair (including a wheelchair)?: None Help needed standing up from a chair using your arms (e.g., wheelchair or bedside chair)?: None Help needed to walk in hospital room?: A Little Help needed climbing 3-5 steps with a railing? : None 6 Click Score: 23    End of Session   Activity Tolerance: Patient tolerated treatment well Patient left: in chair;with call bell/phone within reach Nurse Communication:  Mobility status PT Visit Diagnosis: Difficulty in walking, not elsewhere classified (R26.2);Muscle weakness (generalized) (M62.81)     Time: [id number] PT Time Calculation (min) (ACUTE ONLY): 12 min  Charges:  $Gait Training: 8-22 mins                     Bayard Males, PT Acute Rehabilitation Services Pager: [phone] Office: [place] 02/06/2020, 11:07 AM

## 2020-02-06 NOTE — Plan of Care (Signed)

## 2020-02-06 NOTE — Progress Notes (Signed)
11 Days Post-Op    CC: Abdominal pain  Subjective: Doing well - he said he had a good discussion with Dr. Benay Spice and feels quite encouraged.  Both his mucous fistula and his ileostomy are working well.  He is tolerating soft diet.   Objective: Vital signs in last 24 hours: Temp:  [97.5 F (36.4 C)-98.3 F (36.8 C)] 97.8 F (36.6 C) (02/18 0629) Pulse Rate:  [82-92] 83 (02/18 0629) Resp:  [17-19] 17 (02/18 0629) BP: (128-142)/(80-87) 128/80 (02/18 0629) SpO2:  [100 %] 100 % (02/18 0629) Last BM Date: 02/05/20(thru ileostomy)  Intake/Output from previous day: 02/17 0701 - 02/18 0700 In: 1077 [P.O.:1077] Out: 1205 [Urine:850; Drains:30; Stool:325] Intake/Output this shift: Total I/O In: 280 [P.O.:280] Out: 200 [Urine:200]  General appearance: alert, cooperative and no distress Resp: normal resp effort GI: Soft, mildly ttp around VAC. Not significantly distended Extremities: extremities normal, atraumatic, no cyanosis or edema  Lab Results:  Recent Labs    02/04/20 0117  WBC 5.1  HGB 8.7*  HCT 26.6*  PLT 522*    BMET Recent Labs    02/05/20 0131 02/06/20 0123  NA 134* 132*  K 3.9 3.8  CL 102 99  CO2 24 24  GLUCOSE 107* 103*  BUN 7* 8  CREATININE 0.69 0.63  CALCIUM 9.1 8.7*   PT/INR No results for input(s): LABPROT, INR in the last 72 hours.  Recent Labs  Lab 02/02/20 0255 02/03/20 0701 02/05/20 0131 02/06/20 0123  AST 37 68* 73* 39  ALT 34 81* 112* 86*  ALKPHOS 82 105 108 93  BILITOT 0.7 0.9 0.8 0.7  PROT 6.2* 6.4* 6.3* 6.4*  ALBUMIN 2.0* 2.1* 2.2* 2.3*     Lipase  No results found for: LIPASE   Medications: . acetaminophen  1,000 mg Oral Q8H  . enoxaparin (LOVENOX) injection  40 mg Subcutaneous Q24H  . feeding supplement (GLUCERNA SHAKE)  237 mL Oral BID BM  . insulin aspart  0-15 Units Subcutaneous TID WC  . lip balm  1 application Topical BID  . multivitamin with minerals  1 tablet Oral Daily  . pantoprazole  40 mg Oral Daily  .  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 Barry Dienes   FEN: Regular ID: Ancef 2/10-2/13; Zosyn 2/6-2/10 DVT: lovenox Follow up: Barry Dienes Pain control:  Tylenol 1 g x 2, oxycodone 10 mg x 2   Plan: He is making good progress.  Tolerating regular diet.  Wound VAC to midline in place  LOS: 12 days   Nadeen Landau, M.D. [facility] Use [email or web address] to contact on call provider

## 2020-02-06 NOTE — Progress Notes (Signed)
Occupational Therapy Treatment Patient Details Name: Juan Richards MRN: [id number] DOB: 05-09-1956 Today's Date: 02/06/2020    History of present illness Pt is a 64 y/o male admitted with bowel performation s/p ex lap with R hemicolectomy, end ileostomy, and mucous fistula on 2/7. PMH colon diverticulitis with perforation, prostate cancer with hx of radiation treatments, lumbar surgery, prostatectomy   OT comments  Patient continues to make steady progress towards goals in skilled OT session. Patient's session encompassed education in regards to AE and ADL's and education on transition home. Pt in pain upon therapist's arrival due to just having sneezed and agitated his abdominal incision. Pt not willing to get up and complete functional activities at sink, however agreeable to education for AE, teach-back strategies for transfers (shower) and energy conservation strategies. Pt able to verbalize strategies as well as ways to utilize adaptive equipment, however unwilling to demonstrate. Patient continues to make gains towards functional mobility, and is eager to return home. Will continue to follow acutely.    Follow Up Recommendations  Home health OT    Equipment Recommendations  Other (comment)(Family has now purchased ADL kit for home)    Recommendations for Other Services      Precautions / Restrictions Precautions Precautions: Other (comment) Precaution Comments: abdominal incision, colostomy, VAC Restrictions Weight Bearing Restrictions: No       Mobility Bed Mobility Overal bed mobility: Modified Independent Bed Mobility: Rolling;Sidelying to Sit Rolling: Modified independent (Device/Increase time) Sidelying to sit: Modified independent (Device/Increase time)       General bed mobility comments: HOB 20 degrees with use of rail  Transfers Overall transfer level: Modified independent               General transfer comment: no assist needed    Balance Overall  balance assessment: No apparent balance deficits (not formally assessed)                                         ADL either performed or assessed with clinical judgement   ADL Overall ADL's : Needs assistance/impaired;Modified independent(Education in regards to ostomy bags) Eating/Feeding: Independent   Grooming: Set up;Sitting;Supervision/safety Grooming Details (indicate cue type and reason): in sitting due to pain             Lower Body Dressing: Sitting/lateral leans;Supervision/safety;Set up Lower Body Dressing Details (indicate cue type and reason): supervision for safety, unable to reach socks, however has AE ordered for home, did not want to reattempt             Functional mobility during ADLs: Min guard General ADL Comments: session focused on LB ADLs/selfcare using A/E and energy conservation     Vision Baseline Vision/History: No visual deficits Patient Visual Report: No change from baseline     Perception     Praxis      Cognition Arousal/Alertness: Awake/alert Behavior During Therapy: WFL for tasks assessed/performed Overall Cognitive Status: Within Functional Limits for tasks assessed                                          Exercises     Shoulder Instructions       General Comments      Pertinent Vitals/ Pain       Pain Assessment: 0-10 Pain Score:  7  Pain Location: abdomen Pain Descriptors / Indicators: Aching;Discomfort;Guarding;Sore Pain Intervention(s): Limited activity within patient's tolerance;Monitored during session;Repositioned  Home Living                                          Prior Functioning/Environment              Frequency  Min 2X/week        Progress Toward Goals  OT Goals(current goals can now be found in the care plan section)  Progress towards OT goals: Progressing toward goals  Acute Rehab OT Goals Patient Stated Goal: to go home OT Goal  Formulation: With patient Time For Goal Achievement: 02/11/20 Potential to Achieve Goals: Good  Plan Discharge plan remains appropriate    Co-evaluation                 AM-PAC OT "6 Clicks" Daily Activity     Outcome Measure   Help from another person eating meals?: None Help from another person taking care of personal grooming?: None Help from another person toileting, which includes using toliet, bedpan, or urinal?: A Little Help from another person bathing (including washing, rinsing, drying)?: A Little Help from another person to put on and taking off regular upper body clothing?: None Help from another person to put on and taking off regular lower body clothing?: A Little 6 Click Score: 21    End of Session    OT Visit Diagnosis: Unsteadiness on feet (R26.81);Muscle weakness (generalized) (M62.81);Pain   Activity Tolerance Patient tolerated treatment well   Patient Left in chair;with call bell/phone within reach   Nurse Communication Mobility status        Time: 7680-8811 OT Time Calculation (min): 16 min  Charges: OT General Charges $OT Visit: 1 Visit OT Treatments $Self Care/Home Management : 8-22 mins  Corinne Ports E. Lilianna Case, COTA/L Acute Rehabilitation Services [place] 02/06/2020, 2:06 PM

## 2020-02-06 NOTE — Progress Notes (Signed)
PROGRESS NOTE    Juan Richards  [id number] DOB: 1956-05-22 DOA: 01/25/2020 PCP: [name], MD   Brief Narrative:  64 year old with history of metastatic prostate cancer status post prostatectomy, prediabetes, diverticulitis, diverticulosis was treated outpatient for diverticulitis but due to worsening abdominal pain and distention he was seen in the ER on 2/6.  Admitted for bowel obstruction, diverticulitis and colovesical fistula.  Initially managed conservatively with fluids, bowel rest and antibiotics but developed perforation requiring emergent surgery.  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  Underwent right hemicolectomy with end ileostomy and mucous fistula of transverse colon.  Postop course complicated by ileus.  Received perioperative Ancef followed by Zosyn.  Subjective: Patient was feeling better when seen today.  He was eating his lunch.  No new complaints.  He was waiting for his hospital bed to be delivered which got delayed due to the weather.  Most likely will get his back tomorrow and then he can go home.  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  Stage IV. Postop ileus -Underwent extensive exploratory laparotomy, right hemicolectomy with end ileostomy, mucous fistula of transverse colon on 2/7.  Completed course of antibiotics with Ancef and  Zosyn. -Followed by general surgery.  -Able to tolerate soft diet very well.  Hypomagnesemia.  Resolved.  Magnesium of 2.1 today after repletion.  Sepsis, present on admission.  Multifactorial with UTI and bowel perforation -Completed antibiotic course.  Conservative management at this time.  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  Further management per their team. -Palliative care-full scope of treatment at this time.  Acute on chronic blood loss anemia -Hemoglobin at baseline between 8-9.  Continue to monitor  Generalized deconditioning with moderate protein calorie malnutrition -Supportive care.  Advised to increase nutritional intake -Dietary following  Objective: Vitals:   02/05/20 0520 02/05/20 1556 02/05/20 2224 02/06/20 0629  BP: 132/80 (!) 142/87 136/80 128/80  Pulse: 88 92 82 83  Resp: 20 19 18 17   Temp: 98.7 F (37.1 C) (!) 97.5 F (36.4 C) 98.3 F (36.8 C) 97.8 F (36.6 C)  TempSrc: Oral Oral Oral Oral  SpO2: 100% 100% 100% 100%  Weight:      Height:        Intake/Output Summary (Last 24 hours) at 02/06/2020 1330 Last data filed at 02/06/2020 1100 Gross per 24 hour  Intake 1477 ml  Output 1405 ml  Net 72 ml   Filed Weights   01/25/20 1327  Weight: 87.2 kg    Examination:  General exam: Appears calm and comfortable  Respiratory system: Clear to auscultation. Respiratory effort normal. Cardiovascular system: S1 & S2 heard, RRR. No JVD, murmurs, rubs, gallops or clicks. Gastrointestinal system: Soft, mildly tender with 2 ostomy and one midline VAC in place, bowel sounds positive. Central nervous system: Alert and oriented. No focal neurological deficits.Symmetric 5 x 5 power. Extremities: No edema, no cyanosis, pulses intact and symmetrical. Skin:  No rashes, lesions or ulcers Psychiatry: Judgement and insight appear normal. Mood & affect appropriate.    DVT prophylaxis: Lovenox Code Status:  Full Family Communication: No Family at bedside Disposition Plan: Medically stable to be discharged.  Delivery of hospital bed got delayed till tomorrow due to weather.  He will be discharged tomorrow to home with home health services which has been established.   Procedures:  Antimicrobials:   Data Reviewed: I have personally reviewed following labs and imaging studies  CBC: Recent Labs  Lab 01/31/20 0411 01/31/20 0411 01/31/20 1303 02/01/20 0537 02/02/20 0255 02/03/20 0701 02/04/20 0117  WBC 4.1   < > 5.8 5.7 5.2 5.4 5.1  NEUTROABS 2.9  --   --   --   --   --   --   HGB 8.0*   < > 9.2* 8.2* 8.4* 8.9* 8.7*  HCT 24.8*   < > 27.6* 26.0* 26.4* 27.2* 26.6*  MCV 92.2   < > 90.8 92.9 93.0 90.1 91.7  PLT 307   < > 341 336 390 472* 522*   < > = values in this interval not displayed.   Basic Metabolic Panel: Recent Labs  Lab 02/02/20 0255 02/03/20 0701 02/04/20 0117 02/05/20 0131 02/06/20 0123  NA 135 133* 131* 134* 132*  K 3.8 3.7 3.4* 3.9 3.8  CL 101 96* 97* 102 99  CO2 25 26 23 24 24   GLUCOSE 101* 106* 102* 107* 103*  BUN 7* 8 12 7* 8  CREATININE 0.62 0.65 0.82 0.69 0.63  CALCIUM 8.9 9.1 9.1 9.1 8.7*  MG 1.7 1.8  --  1.5* 2.1  PHOS 3.1 3.5  --   --   --    GFR: Estimated Creatinine Clearance: 102 mL/min (by C-G formula based on SCr of 0.63 mg/dL). Liver Function Tests: Recent Labs  Lab 02/02/20 0255 02/03/20 0701 02/05/20 0131 02/06/20 0123  AST 37 68* 73* 39  ALT 34 81* 112* 86*  ALKPHOS 82 105 108 93  BILITOT 0.7 0.9 0.8 0.7  PROT 6.2* 6.4* 6.3* 6.4*  ALBUMIN 2.0* 2.1* 2.2* 2.3*   No results for input(s): LIPASE, AMYLASE in the last 168 hours. No results for input(s): AMMONIA in the last 168 hours. Coagulation Profile: No results for input(s): INR, PROTIME in the last 168 hours. Cardiac Enzymes: No results for input(s): CKTOTAL, CKMB, CKMBINDEX, TROPONINI in the last 168 hours. BNP (last 3 results) No results for input(s): PROBNP in the last 8760  hours. HbA1C: No results for input(s): HGBA1C in the last 72 hours. CBG: Recent Labs  Lab 02/05/20 1212 02/05/20 1730 02/05/20 2221 02/06/20 0825 02/06/20 1142  GLUCAP 97 113* 97 109* 103*   Lipid Profile: No results for input(s): CHOL, HDL, LDLCALC, TRIG, CHOLHDL, LDLDIRECT in the last 72 hours. Thyroid Function Tests: No results for input(s): TSH, T4TOTAL, FREET4, T3FREE, THYROIDAB in the last 72 hours. Anemia Panel: No results for input(s): VITAMINB12, FOLATE, FERRITIN, TIBC, IRON, RETICCTPCT in the last 72 hours. Sepsis Labs: No results for input(s): PROCALCITON, LATICACIDVEN in the last 168 hours.  No results found for this or any previous visit (from the past 240 hour(s)).   Radiology Studies: No results found.  Scheduled Meds: . acetaminophen  1,000 mg Oral Q8H  . enoxaparin (LOVENOX) injection  40 mg Subcutaneous Q24H  . feeding supplement (GLUCERNA SHAKE)  237 mL Oral BID BM  . insulin aspart  0-15 Units Subcutaneous TID WC  . lip balm  1 application Topical BID  .  multivitamin with minerals  1 tablet Oral Daily  . pantoprazole  40 mg Oral Daily  . Ensure Max Protein  11 oz Oral QHS   Continuous Infusions:   LOS: 12 days   Time spent: 35 minutes.  Lorella Nimrod, MD [facility]  If 7PM-7AM, please contact night-coverage [email or web address]  02/06/2020, 1:30 PM   This record has been created using Systems analyst. Errors have been sought and corrected,but may not always be located. Such creation errors do not reflect on the standard of care.

## 2020-02-07 LAB — GLUCOSE, CAPILLARY
Glucose-Capillary: 108 mg/dL — ABNORMAL HIGH (ref 70–99)
Glucose-Capillary: 99 mg/dL (ref 70–99)

## 2020-02-07 LAB — COMPREHENSIVE METABOLIC PANEL
ALT: 85 U/L — ABNORMAL HIGH (ref 0–44)
AST: 48 U/L — ABNORMAL HIGH (ref 15–41)
Albumin: 2.3 g/dL — ABNORMAL LOW (ref 3.5–5.0)
Alkaline Phosphatase: 120 U/L (ref 38–126)
Anion gap: 10 (ref 5–15)
BUN: 6 mg/dL — ABNORMAL LOW (ref 8–23)
CO2: 25 mmol/L (ref 22–32)
Calcium: 9.5 mg/dL (ref 8.9–10.3)
Chloride: 100 mmol/L (ref 98–111)
Creatinine, Ser: 0.69 mg/dL (ref 0.61–1.24)
GFR calc Af Amer: >60 mL/min (ref >60)
GFR calc non Af Amer: >60 mL/min (ref >60)
Glucose, Bld: 102 mg/dL — ABNORMAL HIGH (ref 70–99)
Potassium: 4 mmol/L (ref 3.5–5.1)
Sodium: 135 mmol/L (ref 135–145)
Total Bilirubin: 0.4 mg/dL (ref 0.3–1.2)
Total Protein: 6.3 g/dL — ABNORMAL LOW (ref 6.5–8.1)

## 2020-02-07 LAB — MAGNESIUM: Magnesium: 1.8 mg/dL (ref 1.7–2.4)

## 2020-02-07 MED ORDER — PANTOPRAZOLE SODIUM 40 MG PO TBEC: 40.0000 mg | DELAYED_RELEASE_TABLET | Freq: Every day | ORAL | 0 refills | Status: DC

## 2020-02-07 MED ORDER — ADULT MULTIVITAMIN W/MINERALS CH: 1.0000 | ORAL_TABLET | Freq: Every day | ORAL | 0 refills | Status: DC

## 2020-02-07 MED ORDER — OXYCODONE HCL 5 MG PO TABS: 5.0000 mg | ORAL_TABLET | ORAL | 0 refills | Status: DC | PRN

## 2020-02-07 MED ORDER — ENSURE MAX PROTEIN PO LIQD: 11.0000 [oz_av] | Freq: Every day | ORAL | 0 refills | Status: AC

## 2020-02-07 MED ORDER — ACETAMINOPHEN 500 MG PO TABS: ORAL_TABLET | ORAL | 0 refills | Status: DC

## 2020-02-07 MED ORDER — GLUCERNA SHAKE PO LIQD: 237.0000 mL | Freq: Two times a day (BID) | ORAL | 0 refills | Status: AC

## 2020-02-07 NOTE — Progress Notes (Signed)
The chaplain visited with the patient as a follow-up. The chaplain will see the patient again if they are not discharged over the weekend.  Brion Aliment Chaplain Resident For questions concerning this note please contact me by pager [phone]

## 2020-02-07 NOTE — Consult Note (Signed)
[place] Nurse wound follow up Wound type:midline abdominal VAC dressing change.  Discharge today. He is connected to home unit in anticipation of this. Supplies in room for wound and ostomy care.  Measurement: See notes 2/17  Unchanged.  PA in room to evaluate today as well.  Wound TTC:NGFRE red and moist Drainage (amount, consistency, odor) minimal serosanguinous  No odor.  Periwound:LLQ Mucus fistula and RLQ ileostomy Dressing procedure/placement/frequency:Cleanse with NS.  2 pieces black foam to wound bed. Cover with drape.  Seal immediately achieved to home unit.  [place] Nurse ostomy follow up Stoma type/location: LLQ MF  Stomal assessment/size: oval, recessed  Had large output last night.  New pouch applied last night but not secure  Removed this and replaced with 1 piece closed end pouch and barrier ring Peristomal assessment: skin creasing at 3 and 9 o'clock Treatment options for stomal/peristomal skin: barrier ring and 1 piece pouch Output soft brown stool Ostomy pouching: 1pc Education provided: pouch change with patient participation Enrolled patient in [employer] Discharge program: Yes  Kit has been received at home. Wife is present today.  [place] Nurse ostomy follow up Stoma type/location: LLQ ileostomy Stomal assessment/size:  Intact 1 1/2 " pink and moist Peristomal assessment: intact Treatment options for stomal/peristomal skin: barrier ring and 2 3/4"  Output soft yellow stool Ostomy pouching: 2pc. 2 3/4"   Education provided: POuch change performed with wife at bedside.   Enrolled patient in [facility] Discharge program: Yes Will not follow at this time.  Please re-consult if needed.  Domenic Moras MSN, RN, FNP-BC CWON Wound, Ostomy, Continence Nurse Pager [phone]

## 2020-02-07 NOTE — TOC Progression Note (Signed)
Transition of Care [facility]) - Progression Note    Patient Details  Name: Juan Richards MRN: [id number] Date of Birth: [birth year]  Transition of Care [facility]) CM/SW Contact  Jacalyn Lefevre Edson Snowball, RN Phone Number: 02/07/2020, 11:12 AM  Clinical Narrative:     Spoke to wife, she is on her way to hospital to pick patient up. She has scheduled delivery of hospital bed this afternoon. [facility] aware discharge is today. Home wound VAC is at bedside.   Expected Discharge Plan: [place]    Expected Discharge Plan and Services Expected Discharge Plan: [place]   Discharge Planning Services: CM Consult Post Acute Care Choice: Home Health, Durable Medical Equipment Living arrangements for the past 2 months: Single Family Home                           HH Arranged: RN, PT, OT           Social Determinants of Health (SDOH) Interventions    Readmission Risk Interventions No flowsheet data found.

## 2020-02-07 NOTE — Progress Notes (Signed)
12 Days Post-Op    CC: Abdominal pain  Subjective: Doing very well this a.m.  Tolerating regular diet.  His ileostomy is working well.  He had a large stool on the mucous side that actually pushed off his back.  I explained that this would eventually empty and that side was essentially a vent.  His midline wound looked good and we plan to continue the wound VAC at home.  I will look at it when they change it later this AM.  Objective: Vital signs in last 24 hours: Temp:  [97.7 F (36.5 C)-98.4 F (36.9 C)] 97.7 F (36.5 C) (02/19 0518) Pulse Rate:  [81-91] 81 (02/19 0518) Resp:  [18-20] 18 (02/19 0518) BP: (118-129)/(63-78) 118/73 (02/19 0518) SpO2:  [98 %-100 %] 100 % (02/19 0518) Last BM Date: 02/07/20 880 p.o. recorded 1600 urine Drain 50 Stool 100 recorded Afebrile vital signs are stable CMP is essentially normal AST 48, ALT 85, total bilirubin 0.4, total protein 6.3, BUN 6, glucose 102.  Potassium 4.0, creatinine 0.69. Intake/Output from previous day: 02/18 0701 - 02/19 0700 In: 110 [P.O.:880] Out: 1750 [Urine:1600; Drains:50; Stool:100] Intake/Output this shift: No intake/output data recorded.  General appearance: alert, cooperative and no distress Resp: clear to auscultation bilaterally Cardio: Regular rate and rhythm GI: Midline wound with wound VAC in place.  Ileostomy is working well, he is tolerating regular diet, mucous fistula is working well and had a large BM yesterday that pushed the bag off. Extremities: extremities normal, atraumatic, no cyanosis or edema  Lab Results:  No results for input(s): WBC, HGB, HCT, PLT in the last 72 hours.  BMET Recent Labs    02/06/20 0123 02/07/20 0328  NA 132* 135  K 3.8 4.0  CL 99 100  CO2 24 25  GLUCOSE 103* 102*  BUN 8 6*  CREATININE 0.63 0.69  CALCIUM 8.7* 9.5   PT/INR No results for input(s): LABPROT, INR in the last 72 hours.  Recent Labs  Lab 02/02/20 0255 02/03/20 0701 02/05/20 0131 02/06/20 0123  02/07/20 0328  AST 37 68* 73* 39 48*  ALT 34 81* 112* 86* 85*  ALKPHOS 82 105 108 93 120  BILITOT 0.7 0.9 0.8 0.7 0.4  PROT 6.2* 6.4* 6.3* 6.4* 6.3*  ALBUMIN 2.0* 2.1* 2.2* 2.3* 2.3*     Lipase  No results found for: LIPASE   Medications: . acetaminophen  1,000 mg Oral Q8H  . enoxaparin (LOVENOX) injection  40 mg Subcutaneous Q24H  . feeding supplement (GLUCERNA SHAKE)  237 mL Oral BID BM  . insulin aspart  0-15 Units Subcutaneous TID WC  . lip balm  1 application Topical BID  . multivitamin with minerals  1 tablet Oral Daily  . pantoprazole  40 mg Oral Daily  . 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 Barry Dienes POD #12   FEN: Regular ID: Ancef 2/10-2/13; Zosyn 2/6-2/10 DVT: lovenox Follow up: Barry Dienes Pain control:  Tylenol 1 g x 2, oxycodone 10 mg x 2   Plan: From a surgical standpoint he is ready for discharge.  Home health has been arranged to help with his care, he has a follow-up appointment with Dr. Barry Dienes  on 02/25/2020.  Discharge instructions are in the AVS.  He is taking Tylenol and oxycodone for pain.  I will provide these for his discharge.  LOS: 13 days    Kalyn Dimattia 02/07/2020 Please see Amion

## 2020-02-08 NOTE — Discharge Summary (Signed)
Juan Richards [id number] DOB: [birth year] DOA: 01/25/2020  PCP: [name], MD  Admit date: 01/25/2020  Discharge date: 02/08/2020  Admitted From: Home   disposition: Home   Recommendations for Outpatient Follow-up:   Follow-up with medical oncology as already scheduled. Follow-up with general surgery as scheduled. Follow up with PCP in 1-2 weeks  Home Health: Home health services Equipment/Devices: Hospital bed, home wound [facility] Consultations: Surgery, oncology, Discharge Condition: Improved CODE STATUS: Full Diet Recommendation: Heart Healthy low-sodium  Diet Order            Diet - low sodium heart healthy               Chief Complaint  Patient presents with  . Abdomen Swelling     Brief history of present illness from the day of admission and additional interim summary    64 year old with history of metastatic prostate cancer status post prostatectomy, prediabetes, diverticulitis, diverticulosis was treated outpatient for diverticulitis but due to worsening abdominal pain and distention he was seen in the ER on 2/6. Admitted for bowel obstruction, diverticulitis and colovesical fistula. Initially managed conservatively with fluids, bowel rest and antibiotics but developed perforation requiring emergent surgery. 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 Underwent right hemicolectomy with end ileostomy and mucous fistula of transverse colon. Postop course complicated by ileus. Received perioperative Ancef followed by Zosyn.                                                                 Hospital Course   Perforated cecum with sigmoid diverticulitis Abdominal carcinomatosis with multiple lesions,appendiceal cancer. Stage  IV. Postop ileus -Underwent extensive exploratory laparotomy, right hemicolectomy with end ileostomy, mucous fistula of transverse colon on 2/7. Completed course of antibiotics with Ancef and Zosyn. -Followed by general surgery. -Able to tolerate soft diet very well.  Hypomagnesemia.  Resolved.  Magnesium of 2.1 today after repletion.  Sepsis, present on admission. Multifactorial with UTI and bowel perforation -Completed antibiotic course. Conservative management at this time.  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 Further management per their team. -Palliative care-full scope of treatment at this time.  Acute on chronic blood loss anemia -Hemoglobin at baseline between 8-9. Continue to monitor  Generalized deconditioning with moderate protein calorie malnutrition -Supportive care. Advised to increase nutritional intake   Discharge diagnosis     Principal Problem:   Bowel perforation ([place]) Active Problems:   UTI (urinary tract infection)   Pre-diabetes   Prostate cancer ([place])   Diverticulitis of large intestine with perforation   SBO (small bowel obstruction) ([facility])   Sepsis ([place])   Neutropenia with fever ([place])   Colovesical fistula   Palliative care by specialist   Acute blood loss anemia  Hyponatremia   Protein-calorie malnutrition, moderate ([facility])   Abdominal carcinomatosis [facility])    Discharge instructions    Discharge Instructions    Call MD for:  difficulty breathing, headache or visual disturbances   Complete by: As directed    Call MD for:  persistant dizziness or light-headedness   Complete by: As directed    Call MD for:  persistant nausea and vomiting   Complete by: As directed    Call MD for:  redness, tenderness, or signs of infection (pain, swelling, redness, odor or green/yellow discharge around incision site)   Complete by: As directed    Call MD for:  severe uncontrolled pain   Complete  by: As directed    Diet - low sodium heart healthy   Complete by: As directed    Discharge instructions   Complete by: As directed    1. You will have Home Health to help you with your wound and ostomy. 2. You have an appointment with Dr Barry Dienes set up for March 9. 3. Make sure to take your Ensure and Glucerna to help build up your nutrition.   Increase activity slowly   Complete by: As directed       Discharge Medications   Allergies as of 02/07/2020   No Known Allergies     Medication List    TAKE these medications   acetaminophen 500 MG tablet Commonly known as: TYLENOL You can take 1000 mg of Tylenol/acetaminophen every 6 hours as needed for pain.  You can buy this over-the-counter at any drugstore.  Do not take more than 4000 mg of Tylenol/acetaminophen per day, it can harm your liver. Notes to patient: Not earlier than 2:00 PM   feeding supplement (GLUCERNA SHAKE) Liqd Take 237 mLs by mouth 2 (two) times daily between meals. Notes to patient: This afternoon at 2:00 PM   Ensure Max Protein Liqd Take 330 mLs (11 oz total) by mouth at bedtime. Notes to patient: Tonight at 10:00 PM   multivitamin with minerals Tabs tablet Take 1 tablet by mouth daily. Notes to patient: Tomorrow at 10:00 AM   oxyCODONE 5 MG immediate release tablet Commonly known as: Oxy IR/ROXICODONE Take 1 tablet (5 mg total) by mouth every 4 (four) hours as needed for breakthrough pain (Pain not relieved by plain Tylenol).   pantoprazole 40 MG tablet Commonly known as: PROTONIX Take 1 tablet (40 mg total) by mouth daily. Notes to patient: Tomorrow at 10:00 AM       [place] Follow up.   Specialty: Home Health Services Contact information: [address] [place] [address] [phone]        [name], MD Follow up on 02/25/2020.   Specialty: General Surgery Why: [id number] appointment is at 4:15 PM.  Be at the office 30 minutes early for  check in.  Bring photo ID and insurance information.   Contact information: [address] [place] [address] [phone]        Ladell Pier, MD Follow up.   Specialty: Oncology Why: If his office does not call you in 1 week, you can call and ask for follow up appointment.  I doubt they will start surgery until your midline incision healing has progressed.   Contact information: [place] [address] [phone]           Major procedures and Radiology Reports - PLEASE review detailed and  final reports thoroughly  -      DG Abd 1 View  Result Date: 01/29/2020 CLINICAL DATA:  Follow-up. Post exploratory laparotomy with right hemicolectomy and end ileostomy. Persistent pain. Nausea today. Evaluate ileus. EXAM: ABDOMEN - 1 VIEW COMPARISON:  Radiograph 01/26/2020, CT 01/25/2020 FINDINGS: Dilated partially air-filled small bowel measuring up to 4.7 cm in the lower abdomen. Ostomy noted in the right lower quadrant. Residual stool in the splenic flexure. Chain sutures noted in the left mid abdomen. No radiopaque calculi or abnormal soft tissue calcifications. IMPRESSION: Bowel-gas pattern most consistent with postoperative ileus with mild gaseous small bowel dilatation. Electronically Signed   By: Keith Rake M.D.   On: 01/29/2020 14:38   DG Abd 1 View  Result Date: 01/26/2020 CLINICAL DATA:  64 year old male with possible pneumoperitoneum on supine abdominal radiograph EXAM: ABDOMEN - 1 VIEW COMPARISON:  Film earlier today FINDINGS: Upright abdominal radiograph demonstrates a large amount of pneumoperitoneum underlying the diaphragm. Distended small bowel loops are again noted. IMPRESSION: 1. Large amount of pneumoperitoneum underlying the diaphragm compatible with bowel perforation. 2. Distended small bowel loops again noted. Critical Value/emergent results were called by telephone at the time of interpretation on 01/26/2020 at 9:23 am to  provider Dr. Barry Dienes, who verbally acknowledged these results. Electronically Signed   By: Margarette Canada M.D.   On: 01/26/2020 09:24   DG Abd 1 View  Result Date: 01/26/2020 CLINICAL DATA:  Small-bowel obstruction. EXAM: ABDOMEN - 1 VIEW COMPARISON:  01/25/2020 CT, 01/24/2020 radiograph and prior studies FINDINGS: Distended small bowel loops within the LEFT abdomen are noted. Both sides of the distended small bowel walls are well-defined and can be a sign of pneumoperitoneum. Gas and stool in the RIGHT colon is again identified. No other significant changes identified. IMPRESSION: Distended small bowel loops with both sides of the walls well-defined - pneumoperitoneum is not excluded. Consider upright/decubitus views or CT for further evaluation. Critical Value/emergent results were called by telephone at the time of interpretation on 01/26/2020 at 7:42 am to provider Charleston Ropes, RN, who verbally acknowledged these results. Electronically Signed   By: Margarette Canada M.D.   On: 01/26/2020 07:43   CT Abdomen Pelvis W Contrast  Result Date: 01/25/2020 CLINICAL DATA:  Acute generalized abdominal pain, nausea, vomiting. EXAM: CT ABDOMEN AND PELVIS WITH CONTRAST TECHNIQUE: Multidetector CT imaging of the abdomen and pelvis was performed using the standard protocol following bolus administration of intravenous contrast. CONTRAST:  18mL OMNIPAQUE IOHEXOL 300 MG/ML  SOLN COMPARISON:  None. FINDINGS: Lower chest: No acute abnormality. Hepatobiliary: No focal liver abnormality is seen. No gallstones, gallbladder wall thickening, or biliary dilatation. Pancreas: Unremarkable. No pancreatic ductal dilatation or surrounding inflammatory changes. Spleen: Normal in size without focal abnormality. Adrenals/Urinary Tract: Adrenal glands are unremarkable. Kidneys are normal, without renal calculi, focal lesion, or hydronephrosis. There appears to be severe wall thickening involving superior portion of the urinary bladder, with fistulization  to the adjacent inflamed sigmoid colon. Stomach/Bowel: Small sliding-type hiatal hernia is noted. Otherwise stomach is unremarkable. There appears to be moderate to severe small bowel dilatation is noted concerning for distal small bowel obstruction. Definite transition zone is not identified. Severe wall thickening of proximal sigmoid colon is noted concerning for diverticulitis. There is dilatation of the more proximal colon concerning for obstruction. Large amount of stool is noted in the cecum. The appendix is clearly visualized. Vascular/Lymphatic: No significant vascular findings are present. No enlarged abdominal or pelvic lymph nodes. Reproductive: Prostate is unremarkable. Other: No abdominal  wall hernia or abnormality. No abdominopelvic ascites. Musculoskeletal: Severe multilevel degenerative disc disease is noted in the lumbar spine. No acute osseous abnormality is noted. IMPRESSION: Severe wall thickening of proximal sigmoid colon is noted consistent with sigmoid diverticulitis. There is significant dilatation of the more proximal colon concerning for some degree of obstruction. Large amount of residual stool is noted in the right colon. There is also noted wall thickening involving the superior portion of the urinary bladder with probable fistula formation extending to the inflamed segment of sigmoid colon. Also noted is moderate to severe small bowel dilatation which is concerning for distal small bowel obstruction, although definite transition zone is not identified. Electronically Signed   By: Marijo Conception M.D.   On: 01/25/2020 11:12   DG Abd 2 Views  Result Date: 01/25/2020 CLINICAL DATA:  Constipation, nausea and vomiting. EXAM: ABDOMEN - 2 VIEW COMPARISON:  July 25, 2009 FINDINGS: Air is seen within markedly dilated loops of large bowel, with a large amount of stool seen within the ascending colon. The haustra markings are preserved throughout the colon. The visualized small bowel loops are  normal in caliber. There is no evidence of free air. No radio-opaque calculi or other significant radiographic abnormality is seen. IMPRESSION: Large stool burden with dilated air-filled loops of large bowel. While this may be, in part, chronic in nature, a distal large bowel obstruction cannot be excluded. Electronically Signed   By: Virgina Norfolk M.D.   On: 01/25/2020 15:46   DG Abd Portable 1V  Result Date: 01/30/2020 CLINICAL DATA:  Postoperative ileus EXAM: PORTABLE ABDOMEN - 1 VIEW COMPARISON:  Yesterday FINDINGS: Gaseous distention of the colon. An ostomy is seen over the right lower quadrant. Enteric tube with tip at the distal stomach. IMPRESSION: 1. Moderate gaseous distention of the colon. 2. The enteric tube is in good position. Electronically Signed   By: Monte Fantasia M.D.   On: 01/30/2020 09:14   DG Abd Portable 1V  Result Date: 01/29/2020 CLINICAL DATA:  NG tube placement. EXAM: PORTABLE ABDOMEN - 1 VIEW COMPARISON:  Radiograph earlier this day. FINDINGS: Tip and side port of the enteric tube below the diaphragm in the stomach. Dilated small bowel in the lower abdomen again seen. IMPRESSION: Tip and side port of the enteric tube below the diaphragm in the stomach. Electronically Signed   By: Keith Rake M.D.   On: 01/29/2020 18:27    Micro Results    No results found for this or any previous visit (from the past 240 hour(s)).  Today   Subjective    Juan Richards today is eager to go home.  He feels that he knows how to manage his ostomies and the wound VAC.  Feels his pain is well controlled.  Is tolerating his food without difficulty.  Objective   Blood pressure 118/73, pulse 81, temperature 97.7 F (36.5 C), temperature source Oral, resp. rate 18, height 5\' 9"  (1.753 m), weight 87.2 kg, SpO2 100 %.   Intake/Output Summary (Last 24 hours) at 02/08/2020 0715 Last data filed at 02/07/2020 0756 Gross per 24 hour  Intake 240 ml  Output 100 ml  Net 140 ml     Exam  General exam: Appears calm and comfortable  Respiratory system: Clear to auscultation. Respiratory effort normal. Cardiovascular system: S1 & S2 heard, RRR. No JVD, murmurs, rubs, gallops or clicks. Gastrointestinal system: Soft, mildly tender with 2 ostomy and one midline VAC in place, bowel sounds positive. Central nervous system: Alert  and oriented. No focal neurological deficits.Symmetric 5 x 5 power. Extremities: No edema, no cyanosis, pulses intact and symmetrical. Skin: No rashes, lesions or ulcers Psychiatry: Judgement and insight appear normal. Mood & affect appropriate.    Data Review   CBC w Diff:  Lab Results  Component Value Date   WBC 5.1 02/04/2020   HGB 8.7 (L) 02/04/2020   HCT 26.6 (L) 02/04/2020   PLT 522 (H) 02/04/2020   LYMPHOPCT 9 01/31/2020   MONOPCT 15 01/31/2020   EOSPCT 1 01/31/2020   BASOPCT 1 01/31/2020    CMP:  Lab Results  Component Value Date   NA 135 02/07/2020   K 4.0 02/07/2020   CL 100 02/07/2020   CO2 25 02/07/2020   BUN 6 (L) 02/07/2020   CREATININE 0.69 02/07/2020   PROT 6.3 (L) 02/07/2020   ALBUMIN 2.3 (L) 02/07/2020   BILITOT 0.4 02/07/2020   ALKPHOS 120 02/07/2020   AST 48 (H) 02/07/2020   ALT 85 (H) 02/07/2020  .   Total Time in preparing paper work, data evaluation and todays exam - 35 minutes  Vashti Hey M.D on 02/08/2020 at 7:15 AM  [facility]   Office  [phone]

## 2020-02-10 ENCOUNTER — Telehealth: Payer: Self-pay | Admitting: Oncology

## 2020-02-10 NOTE — Telephone Encounter (Signed)
A hospital follow up appointment has been scheduled for Juan Richards on 3/9 at 11am w/labs at 1030am. Pt aware to arrive 15 minutes early.

## 2020-02-18 ENCOUNTER — Telehealth: Payer: Self-pay

## 2020-02-18 NOTE — Telephone Encounter (Signed)
Called patient to remind of follow up appointment on 02/19/2020 at this time patient declined follow up appointment. He is seeking a second opinion due to hospitalization and current health status. Patient very emotional on phone call writer let patient express concerns. Listened as patient voiced frustration, anger, and grief. Provider is informed of patient decision to seek a second opinion.

## 2020-02-19 ENCOUNTER — Telehealth: Payer: Self-pay | Admitting: *Deleted

## 2020-02-19 ENCOUNTER — Telehealth: Payer: Self-pay | Admitting: Oncology

## 2020-02-19 ENCOUNTER — Ambulatory Visit: Payer: 59 | Admitting: Urology

## 2020-02-19 ENCOUNTER — Inpatient Hospital Stay: Admission: RE | Admit: 2020-02-19 | Payer: Self-pay | Source: Ambulatory Visit | Admitting: Urology

## 2020-02-19 NOTE — Telephone Encounter (Signed)
Scheduled appt per 3/3 sch msg. Pt is aware of appt date and time.

## 2020-02-19 NOTE — Telephone Encounter (Signed)
Called to request to cancel his lab/OV on 02/25/20 and reschedule for a Tues/Thurs 1-2 weeks later. Still trying to recover from surgery and still has a woundvac. Per Dr. Benay Spice, [place] to move out 2 weeks. Scheduling message sent.

## 2020-02-20 ENCOUNTER — Telehealth: Payer: Self-pay | Admitting: *Deleted

## 2020-02-20 NOTE — Telephone Encounter (Signed)
Patient had asked scheduler to have nurse call him due to questions he has regarding f/u appointment. Called and left VM to return call or send Mychart message w/questions.

## 2020-02-25 ENCOUNTER — Inpatient Hospital Stay: Payer: 59 | Admitting: Oncology

## 2020-02-25 ENCOUNTER — Inpatient Hospital Stay: Payer: 59

## 2020-02-26 ENCOUNTER — Telehealth: Payer: Self-pay | Admitting: Emergency Medicine

## 2020-02-26 NOTE — Progress Notes (Signed)
Patient calls with concerns after seeing Dr. Barry Dienes in the office yesterday.  He is concerned because he was told that his intestine is folded over his bladder and that he should be referred a provider at [facility].  He is unable to recall a name.  He continues to loose weight despite eating 3 meals a day/plus Ensure supplements.  He is worried about getting something/treatment started.  I explained to him I will follow up with Dr. Benay Spice and Dr. Marlowe Aschoff office to find out what the plan is from this point forward and will give him a call back in the morning.  He verbalized an understanding and was appreciative of the call.

## 2020-02-26 NOTE — Telephone Encounter (Signed)
Received call from pt requesting to speak to 'Dr. Gearldine Shown Nurse Practitioner Lattie Haw'.  Pt declined to provide any further info to desk RN and asked to speak with her directly.  Call back info taken and pt advised he would be called back shortly by NP Lattie Haw, pt verbalized understanding.  Information given to NP Lattie Haw who states she will call him back.

## 2020-02-27 NOTE — Progress Notes (Signed)
Spoke with patient regarding the need to get in with Dr. Benay Spice prior to 3/23 appointment.  I offered him tomorrow 3/12 at 11 or Monday 3/15 at 2 pm.  Patient desired to come Monday at 2 due to nurse coming tomorrow to change wound vac.  I have made this appointment and made Dr. Benay Spice aware.   Valda Favia RN GI Nurse Navigator

## 2020-03-02 ENCOUNTER — Inpatient Hospital Stay: Payer: 59 | Attending: Oncology | Admitting: Oncology

## 2020-03-02 ENCOUNTER — Other Ambulatory Visit: Payer: Self-pay

## 2020-03-02 ENCOUNTER — Inpatient Hospital Stay: Payer: 59

## 2020-03-02 VITALS — BP 132/80 | HR 86 | Temp 98.0°F | Resp 17 | Ht 69.0 in | Wt 184.3 lb

## 2020-03-02 DIAGNOSIS — C61 Malignant neoplasm of prostate: Secondary | ICD-10-CM

## 2020-03-02 DIAGNOSIS — D649 Anemia, unspecified: Secondary | ICD-10-CM | POA: Insufficient documentation

## 2020-03-02 DIAGNOSIS — Z8616 Personal history of COVID-19: Secondary | ICD-10-CM | POA: Insufficient documentation

## 2020-03-02 DIAGNOSIS — N39498 Other specified urinary incontinence: Secondary | ICD-10-CM | POA: Insufficient documentation

## 2020-03-02 DIAGNOSIS — D373 Neoplasm of uncertain behavior of appendix: Secondary | ICD-10-CM | POA: Diagnosis not present

## 2020-03-02 DIAGNOSIS — C181 Malignant neoplasm of appendix: Secondary | ICD-10-CM | POA: Diagnosis not present

## 2020-03-02 DIAGNOSIS — Z923 Personal history of irradiation: Secondary | ICD-10-CM | POA: Insufficient documentation

## 2020-03-02 LAB — CBC WITH DIFFERENTIAL (CANCER CENTER ONLY)
Abs Immature Granulocytes: 0.01 10*3/uL (ref 0.00–0.07)
Basophils Absolute: 0 10*3/uL (ref 0.0–0.1)
Basophils Relative: 1 %
Eosinophils Absolute: 0.1 10*3/uL (ref 0.0–0.5)
Eosinophils Relative: 1 %
HCT: 32.8 % — ABNORMAL LOW (ref 39.0–52.0)
Hemoglobin: 10.7 g/dL — ABNORMAL LOW (ref 13.0–17.0)
Immature Granulocytes: 0 %
Lymphocytes Relative: 30 %
Lymphs Abs: 1.5 10*3/uL (ref 0.7–4.0)
MCH: 29.8 pg (ref 26.0–34.0)
MCHC: 32.6 g/dL (ref 30.0–36.0)
MCV: 91.4 fL (ref 80.0–100.0)
Monocytes Absolute: 0.4 10*3/uL (ref 0.1–1.0)
Monocytes Relative: 8 %
Neutro Abs: 3 10*3/uL (ref 1.7–7.7)
Neutrophils Relative %: 60 %
Platelet Count: 295 10*3/uL (ref 150–400)
RBC: 3.59 MIL/uL — ABNORMAL LOW (ref 4.22–5.81)
RDW: 13.8 % (ref 11.5–15.5)
WBC Count: 5 10*3/uL (ref 4.0–10.5)
nRBC: 0 % (ref 0.0–0.2)

## 2020-03-02 LAB — CMP (CANCER CENTER ONLY)
ALT: 37 U/L (ref 0–44)
AST: 20 U/L (ref 15–41)
Albumin: 3.9 g/dL (ref 3.5–5.0)
Alkaline Phosphatase: 120 U/L (ref 38–126)
Anion gap: 10 (ref 5–15)
BUN: 19 mg/dL (ref 8–23)
CO2: 20 mmol/L — ABNORMAL LOW (ref 22–32)
Calcium: 10.5 mg/dL — ABNORMAL HIGH (ref 8.9–10.3)
Chloride: 105 mmol/L (ref 98–111)
Creatinine: 0.88 mg/dL (ref 0.61–1.24)
GFR, Est AFR Am: >60 mL/min (ref >60)
GFR, Estimated: >60 mL/min (ref >60)
Glucose, Bld: 91 mg/dL (ref 70–99)
Potassium: 4.3 mmol/L (ref 3.5–5.1)
Sodium: 135 mmol/L (ref 135–145)
Total Bilirubin: 0.3 mg/dL (ref 0.3–1.2)
Total Protein: 8.7 g/dL — ABNORMAL HIGH (ref 6.5–8.1)

## 2020-03-02 NOTE — Progress Notes (Signed)
Spoke with patient's wife privately.  She is concerned because she needs to have her right shoulder operated on 3/29.  She has put this off one time before.  I explained to her it would probably be better for her to proceed prior to his treatment starting.  She is overwhelmed with his diagnosis and treatment plan ahead.  Not to mention she is taking care of her 67 year mother who just got out of the hospital.  A SW referral has already been placed by [employer] RN.  I have encouraged the patient and his wife to contact me directly with any questions or concerns.  They have my direct phone number.

## 2020-03-02 NOTE — Progress Notes (Signed)
Juan Richards OFFICE PROGRESS NOTE   Diagnosis: Appendiceal carcinoma, prostate cancer  INTERVAL HISTORY:   Juan Richards was discharged from the hospital on 02/08/2020.  The abdominal wound is healing.  The wound VAC has been removed.  He continues follow-up with a home nurse.  He reports a good appetite.  He has a large volume of stool output from the ostomy.  He has not started the Imodium.  The stool is sometimes formed.  He has urinary incontinence since undergoing prostate radiation.  Juan Richards saw Juan Richards in follow-up.  He has been referred to Juan Richards.  Objective:  Vital signs in last 24 hours:  Blood pressure 132/80, pulse 86, temperature 98 F (36.7 C), temperature source Temporal, resp. rate 17, height _0  (1.753 m), weight 184 lb 4.8 oz (83.6 kg), SpO2 100 %.    Lymphatics: No cervical, supraclavicular, axillary, or inguinal nodes Resp: Lungs clear bilaterally Cardio: Regular rate and rhythm GI: No hepatomegaly, right lower quadrant ileostomy, left abdomen mucous fistula, the midline wound has a superficial opening with a gauze packing. Vascular: No leg edema   Lab Results:  Lab Results  Component Value Date   WBC 5.0 03/02/2020   HGB 10.7 (L) 03/02/2020   HCT 32.8 (L) 03/02/2020   MCV 91.4 03/02/2020   PLT 295 03/02/2020   NEUTROABS 3.0 03/02/2020    CMP  Lab Results  Component Value Date   NA 135 03/02/2020   K 4.3 03/02/2020   CL 105 03/02/2020   CO2 20 (L) 03/02/2020   GLUCOSE 91 03/02/2020   BUN 19 03/02/2020   CREATININE 0.88 03/02/2020   CALCIUM 10.5 (H) 03/02/2020   PROT 8.7 (H) 03/02/2020   ALBUMIN 3.9 03/02/2020   AST 20 03/02/2020   ALT 37 03/02/2020   ALKPHOS 120 03/02/2020   BILITOT 0.3 03/02/2020   GFRNONAA >60 03/02/2020   GFRAA >60 03/02/2020    Medications: I have reviewed the patient's current medications.   Assessment/Plan: 1. 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          -Status post exploratory laparotomy, right hemicolectomy with end              ileostomy, mucous fistula of transverse colon on 01/26/2020             -Pathology revealed goblet cell adenocarcinoma of the appendix with tumor invading the visceral peritoneum, 12 tumor deposits, 0/10 lymph nodes positive, negative resection margins, metastatic carcinoma involving the ileal mesentery noted, low tumor purity  with decreased sensitivity for detection of HER-2 copy number and other alterations, microsatellite status and tumor mutation burden could not be determined             -Diffuse subcentimeter nodularity over the mesentery and bowel, sigmoid colon adherent to bladder and left middle pelvic sidewall, gross contamination with succus 2. Prostate cancer diagnosed in August 2019, robotic assisted prostatectomy 09/21/2018-Gleason 7 acinar tumor,pT3pN1, invasion of seminal vesicles, 3/16 lymph nodes positive, positive right anterior margin positive             -Gleason score 4+3 equal 7 and PSA of 60.97.               -Staging work-up did not show any evidence of metastatic disease.              -Bone scan 08/17/2018-negative for metastatic disease             -  Elevated PSA July 2019             -PET scan scan 10/15/2019-negative for metastatic disease, evidence of sigmoid diverticulitis with a small contained perforation             -Radiation-definitive, 10/29/2019-01/15/2020, interrupted 11/22/2019-12/17/2019 secondary to COVID-19 infection  3. Anemia 4. Deconditioning 5.  COVID-19 infection December 2020    Disposition: Juan Richards continues to recover from the abdominal surgery.  He has been diagnosed with metastatic appendiceal carcinoma.  He also has a history of recurrent prostate cancer.  I discussed the prognosis and treatment options with Juan Richards and his wife.  He understands no therapy will be curative.  He has been referred to Juan Richards to consider cytoreductive surgery.  I  recommend initial systemic therapy with FOLFOX.  He may be a candidate for cytoreductive surgery in the future.  We reviewed potential toxicities associated with the FOLFOX regimen including the chance for nausea/vomiting, mucositis, diarrhea, alopecia, and hematologic toxicity.  We discussed the sun sensitivity, rash, hyperpigmentation, and hand/foot syndrome associated with 5-fluorouracil.  We reviewed the allergic reaction and various types of neuropathy seen with oxaliplatin.  He agrees to proceed.  He will attend a chemotherapy teaching class We will request microsatellite, Her-2, and immunohistochemistry testing on the resected tumor I will refer him to Juan Richards for placement of a Port-A-Cath.  He will undergo staging CT scans and return for an office visit in 2 weeks.  We will check a PSA and CEA today.  I encouraged him to begin Imodium for the high-output ileostomy.  His case will be presented at the GI tumor conference on 03/04/2020.  Betsy Coder, MD  03/02/2020  4:29 PM

## 2020-03-02 NOTE — Progress Notes (Signed)
START OFF PATHWAY REGIMEN - Other   [id number]:FOLFOX (q14d) **2 cycles per order sheet**:   A cycle is every 14 days:     Oxaliplatin      Leucovorin      Fluorouracil      Fluorouracil   **Always confirm dose/schedule in your pharmacy ordering system**  Patient Characteristics: Intent of Therapy: Non-Curative / Palliative Intent, Discussed with Patient

## 2020-03-03 ENCOUNTER — Other Ambulatory Visit: Payer: Self-pay

## 2020-03-03 ENCOUNTER — Telehealth: Payer: Self-pay | Admitting: Oncology

## 2020-03-03 ENCOUNTER — Encounter: Payer: Self-pay | Admitting: *Deleted

## 2020-03-03 ENCOUNTER — Encounter (HOSPITAL_BASED_OUTPATIENT_CLINIC_OR_DEPARTMENT_OTHER): Payer: Self-pay | Admitting: General Surgery

## 2020-03-03 LAB — PROSTATE-SPECIFIC AG, SERUM (LABCORP): Prostate Specific Ag, Serum: <0.1 ng/mL (ref 0.0–4.0)

## 2020-03-03 LAB — CEA (IN HOUSE-CHCC): CEA (CHCC-In House): 1.53 ng/mL (ref 0.00–5.00)

## 2020-03-03 NOTE — Telephone Encounter (Signed)
Scheduled appt per 3/16 sch message - pt is aware of apt date and time

## 2020-03-03 NOTE — Progress Notes (Signed)
Per Dr. Benay Spice request: Email to [facility] requesting MSI, MMR ([facility]) and Her 2 testing on case #[id number] dated 01/26/20.

## 2020-03-03 NOTE — Progress Notes (Signed)
Called Central Scheduling to get CT CAP scheduled.  Spoke to patient informed him CT scheduled for Thursday 3/25 at 8:30 am to arrive by 8:15 at [facility], NPO for 4 hours prior, pick up 2 bottles of oral prep from the [place] front desk.  Drink one bottle at 6:30 am and one bottle at 7:30 am.  I will have the written instructions inside the bag with the bottles of oral prep.  He verbalized an understanding and also states he heard from Dr. Georgina Snell office at [facility] and has an appointment with them on 4/2.  Dr. Benay Spice was made aware.

## 2020-03-03 NOTE — Progress Notes (Signed)
[place] Psychosocial Distress Screening Clinical Social Work  Clinical Social Work was referred by distress screening protocol.  The patient scored a 10 on the Psychosocial Distress Thermometer which indicates severe distress. Clinical Social Worker contacted patient at home to assess for distress and other psychosocial needs.  CSW left patient a detailed message offering support and encouraging patient to return call.   ONCBCN DISTRESS SCREENING 03/02/2020  Screening Type   Distress experienced in past week (1-10) 10  Family Problem type Other (comment)  Emotional problem type Depression;Nervousness/Anxiety  Physical Problem type Pain;Sleep/insomnia  Referral to support programs     Juan Richards, MSW, LCSW, OSW-C Clinical Social Worker [facility] [phone]

## 2020-03-04 ENCOUNTER — Encounter: Payer: Self-pay | Admitting: *Deleted

## 2020-03-04 ENCOUNTER — Telehealth: Payer: Self-pay | Admitting: Oncology

## 2020-03-04 ENCOUNTER — Other Ambulatory Visit: Payer: Self-pay

## 2020-03-04 ENCOUNTER — Telehealth: Payer: Self-pay | Admitting: *Deleted

## 2020-03-04 NOTE — Progress Notes (Signed)
[place] Psychosocial Distress Screening Clinical Social Work  Clinical Social Work was referred by distress screening protocol.  The patient scored a 10 on the Psychosocial Distress Thermometer which indicates severe distress. Clinical Social Worker contacted patient at home to assess for distress and other psychosocial needs.  Patient stated while he was still angry and overwhelmed, he was feeling much better than he was 2 weeks ago.  Patient described traumatic experience when completing radiation and then having emergency surgery which resulted in a new cancer diagnosis.  CSW and patient discussed common feelings and emotions when being diagnosed with cancer, and the importance of support.  Patient stated his family was supportive, but also had many health issues of their own.  His wife needing surgery and his mother in laws health needs.  Patient stated a nurse was still coming to his home 3 times a week, and his mother in law had home care services.  Patients daughter is also supportive.  She is a Pharmacist, hospital and in graduate school, and has a very full schedule.  Patient stated he planned to see Dr. [name] in early April to discuss surgery options.  Patient stated he was wrapping his head around what was in front of him and taking things one step at a time.  CSW provided patient with information on the support team and support services at [facility].  CSW and patient discussed financial resources and emotional support.  Patient was appreciative of information and will contact CSW with needs or concerns.     ONCBCN DISTRESS SCREENING 03/02/2020  Screening Type   Distress experienced in past week (1-10) 10  Family Problem type Other (comment)  Emotional problem type Depression;Nervousness/Anxiety  Physical Problem type Pain;Sleep/insomnia  Referral to support programs     Johnnye Lana, MSW, LCSW, OSW-C Clinical Social Worker [facility] : [facility] [phone]

## 2020-03-04 NOTE — Telephone Encounter (Signed)
Scheduled per los. Called and spoke with patient. Confirmed appt 

## 2020-03-04 NOTE — Telephone Encounter (Signed)
LM- PSA within normal limits.

## 2020-03-05 ENCOUNTER — Ambulatory Visit: Payer: 59 | Attending: Internal Medicine

## 2020-03-05 DIAGNOSIS — Z23 Encounter for immunization: Secondary | ICD-10-CM

## 2020-03-05 NOTE — Progress Notes (Signed)
   Covid-19 Vaccination Clinic  Name:  Juan Richards    MRN: [id number] DOB: 11-19-1956  03/05/2020  [name] was observed post Covid-19 immunization for 15 minutes without incident. He was provided with Vaccine Information Sheet and instruction to access the V-Safe system.   [name] was instructed to call 911 with any severe reactions post vaccine: Marland Kitchen Difficulty breathing  . Swelling of face and throat  . A fast heartbeat  . A bad rash all over body  . Dizziness and weakness   Immunizations Administered    Name Date Dose VIS Date Route   Pfizer COVID-19 Vaccine 03/05/2020  4:05 PM 0.3 mL 11/29/2019 Intramuscular   Manufacturer: [place]   Lot: [id number]   [place]: [id number]

## 2020-03-06 ENCOUNTER — Other Ambulatory Visit (HOSPITAL_COMMUNITY)
Admission: RE | Admit: 2020-03-06 | Discharge: 2020-03-06 | Disposition: A | Discharge status: Home / Self Care | Payer: 59 | Source: Ambulatory Visit | Attending: General Surgery | Admitting: General Surgery

## 2020-03-06 DIAGNOSIS — Z20822 Contact with and (suspected) exposure to covid-19: Secondary | ICD-10-CM | POA: Diagnosis not present

## 2020-03-06 DIAGNOSIS — Z01812 Encounter for preprocedural laboratory examination: Secondary | ICD-10-CM | POA: Diagnosis not present

## 2020-03-06 LAB — SURGICAL PATHOLOGY

## 2020-03-06 LAB — SARS CORONAVIRUS 2 (TAT 6-24 HRS): SARS Coronavirus 2: NEGATIVE

## 2020-03-09 ENCOUNTER — Other Ambulatory Visit: Payer: Self-pay | Admitting: General Surgery

## 2020-03-09 NOTE — H&P (Signed)
Juan Richards Documented: 02/25/2020 4:33 PM Location: [facility] Patient #: [id number] DOB: [birth year] Married / Language: English / Race: Black or African American Male   History of Present Illness [name] MD; 03/02/2020 12:23 PM) The patient is a 64 year old male who presents for a follow-up for Diagnosis of Cancer. Mr Juan Richards is an unfortunate 17 yo M who I met on call 01/26/2020. He had been admitted the previous day with concern for partial colonic obstruction with concerns for possible diverticulitis and possible colovesical fistula. I explored him. He was found to have a perforated cecum, which I resected. I created an end ileostomy and a transverse colon mucus fistula. He was found to have carcinomatosis with innumerable small nodules on his small and large bowel and mesentery. He had a recent history of rising PSA concerning for recurrent prostate cancer. He had been receiving external radiation for this. [name] came into the OR and stated that this was completely inconsistent with prostate cancer. I could not get the sigmoid colon off the left pelvic sidewall or bladder easily. Given the innumerable nodules, I did not pursue this. 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 He had a significant post op ileus and was in the hospital for 2 weeks.   Since being home, he is understandably upset. he has an ileostomy that is having to be emptied often. He is havign wound vac changes to midline wound three times/week. His mucus fistula output is slowing down significantly. He denies air in his urine. He is angry and feels like he was ignored in radiation oncology. He states that he complained for weeks about progressive pain and swelling of his abdomen and didn't get any imaging or feedback. He is not sure if this situation was worsened by delay.   he is concerned because he is eating yet still losing weight.   pathology 01/26/2020. A.  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 - Tumor invades the visceral peritoneum. - Margins of resection are not involved. - Ten lymph nodes, negative for metastatic carcinoma (0/10). - Twelve tumor deposits. - See oncology table.  B. ILEUM, SEGMENTAL RESECTION: - Metastatic carcinoma involving mesentery.  ONCOLOGY TABLE:  APPENDIX:  Procedure: Right hemicolectomy Tumor Size: 1.9 cm Histologic Type: Goblet cell adenocarcinoma Histologic Grade: G2-3: Moderately to poorly differentiated Tumor Extension: Tumor invades the visceral peritoneum Margins: Uninvolved by tumor Lymphovascular Invasion: Not identified Tumor Deposits: Present Number: 12 Regional Lymph Nodes: Number of Lymph Nodes Involved: 0 Number of Lymph Nodes Examined: 10 Pathologic Stage Classification (pTNM, AJCC 8th Edition): pT4a, pN1, pM1b   Past Surgical History Juan Richards, [name]; 02/25/2020 4:33 PM) Colon Polyp Removal - Colonoscopy   Diagnostic Studies History Juan Richards, [name]; 02/25/2020 4:33 PM) Colonoscopy  within last year  Allergies Juan Richards, [name]; 02/25/2020 4:34 PM) No Known Drug Allergies [02/25/2020]: Allergies Reconciled   Medication History Juan Richards, [name]; 02/25/2020 4:37 PM) Pantoprazole Sodium ('40MG'$  Tablet DR, Oral) Active. oxyCODONE HCl ('5MG'$  Tablet, Oral) Active. Medications Reconciled  Social History Juan Richards, [name]; 02/25/2020 4:33 PM) Alcohol use  Occasional alcohol use. Caffeine use  Carbonated beverages, Coffee, Tea. No drug use  Tobacco use  Never smoker.  Family History Juan Richards, [name]; 02/25/2020 4:33 PM) Diabetes Mellitus  Brother, Mother, Sister. Hypertension  Brother, Mother, Sister. Prostate Cancer  Brother.  Other Problems Juan Richards, [name]; 02/25/2020 4:33 PM) Diverticulosis  Prostate Cancer     Review of  Systems [name] MD; 03/02/2020 12:22  PM) General Present- Chills, Fatigue and Night Sweats. Not Present- Appetite Loss, Fever, Weight Gain and Weight Loss. Skin Present- Dryness. Not Present- Change in Wart/Mole, Hives, Jaundice, New Lesions, Non-Healing Wounds, Rash and Ulcer. HEENT Present- Hoarseness. Not Present- Earache, Hearing Loss, Nose Bleed, Oral Ulcers, Ringing in the Ears, Seasonal Allergies, Sinus Pain, Sore Throat, Visual Disturbances, Wears glasses/contact lenses and Yellow Eyes. Gastrointestinal Present- Rectal Pain. Not Present- Abdominal Pain, Bloating, Bloody Stool, Change in Bowel Habits, Chronic diarrhea, Constipation, Difficulty Swallowing, Excessive gas, Gets full quickly at meals, Hemorrhoids, Indigestion, Nausea and Vomiting. Male Genitourinary Present- Blood in Urine, Change in Urinary Stream, Impotence, Painful Urination and Urine Leakage. Not Present- Frequency, Nocturia and Urgency. Musculoskeletal Present- Muscle Weakness. Not Present- Back Pain, Joint Pain, Joint Stiffness, Muscle Pain and Swelling of Extremities. Psychiatric Present- Change in Sleep Pattern. Not Present- Anxiety, Bipolar, Depression, Fearful and Frequent crying. Endocrine Present- Excessive Hunger and Hot flashes. Not Present- Cold Intolerance, Hair Changes, Heat Intolerance and New Diabetes. All other systems negative  Vitals Juan Richards [name]; 02/25/2020 4:34 PM) 02/25/2020 4:33 PM Weight: 182.6 lb Height: 70in Body Surface Area: 2.01 m Body Mass Index: 26.2 kg/m  Temp.: 97.50F  Pulse: 97 (Regular)  BP: 122/80 (Sitting, Left Arm, Standard)       Physical Exam [name] MD; 03/02/2020 12:24 PM) Abdomen Note: wound vac in place. Per pt, wound "looked good yesterday by nurse." he will send a picture tomorrow. ileostomy pink. output not thin, but pretty liquidy. mucus fistula with no output. abdomen flat and only tender at incision.   Neuropsychiatric Note: tearful and angry.     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  Added imodium for ostomy output. Will refer to [facility] for consideration of HIPEC. I advised him that he will likely need chemo first.  continue wound vac changes for now, but wound shrinking. Current Plans Started oxyCODONE HCl 5 MG Oral Tablet, 1-2 Tablet every six hours, as needed, #30, 02/25/2020, No Refill. Started Protonix 40 MG Oral Tablet Delayed Release, 1 (one) Tablet daily, #30, 30 days starting 02/25/2020, Ref. x3. Started Ondansetron 8 MG Oral Tablet Disintegrating, 1 (one) Tablet q 8 hours as needed, #20, 02/25/2020, No Refill. Started Loperamide HCl 2 MG Oral Tablet, 1 (one) Tablet TID, #90, 02/25/2020, No Refill. Follow up with [place] in the office in 1 month.  Call us sooner as needed.  LARGE BOWEL OBSTRUCTION (K56.609) Impression: site of obstruction still present, but relieved by mucus fistula. Will need colonoscopy. The cystoscopy pre op did not show any obvious fistula into the bladder. CARCINOMATOSIS (C80.0) Impression: see above. SITUATIONAL DEPRESSION (F43.21)    Signed electronically by [name], MD (03/02/2020 12:26 PM)

## 2020-03-10 ENCOUNTER — Other Ambulatory Visit: Payer: 59

## 2020-03-10 ENCOUNTER — Other Ambulatory Visit: Payer: Self-pay

## 2020-03-10 ENCOUNTER — Ambulatory Visit (HOSPITAL_BASED_OUTPATIENT_CLINIC_OR_DEPARTMENT_OTHER): Payer: 59 | Admitting: Anesthesiology

## 2020-03-10 ENCOUNTER — Encounter (HOSPITAL_BASED_OUTPATIENT_CLINIC_OR_DEPARTMENT_OTHER): Payer: Self-pay | Admitting: General Surgery

## 2020-03-10 ENCOUNTER — Encounter (HOSPITAL_COMMUNITY): Payer: Self-pay | Admitting: Oncology

## 2020-03-10 ENCOUNTER — Ambulatory Visit (HOSPITAL_BASED_OUTPATIENT_CLINIC_OR_DEPARTMENT_OTHER)
Admission: RE | Admit: 2020-03-10 | Discharge: 2020-03-10 | Disposition: A | Discharge status: Home / Self Care | Payer: 59 | Attending: General Surgery | Admitting: General Surgery

## 2020-03-10 ENCOUNTER — Ambulatory Visit: Payer: 59 | Admitting: Nurse Practitioner

## 2020-03-10 ENCOUNTER — Ambulatory Visit (HOSPITAL_COMMUNITY): Payer: 59

## 2020-03-10 ENCOUNTER — Encounter (HOSPITAL_BASED_OUTPATIENT_CLINIC_OR_DEPARTMENT_OTHER)
Admission: RE | Disposition: A | Discharge status: Home / Self Care | Payer: Self-pay | Source: Home / Self Care | Attending: General Surgery

## 2020-03-10 DIAGNOSIS — K56609 Unspecified intestinal obstruction, unspecified as to partial versus complete obstruction: Secondary | ICD-10-CM | POA: Diagnosis not present

## 2020-03-10 DIAGNOSIS — C181 Malignant neoplasm of appendix: Secondary | ICD-10-CM | POA: Diagnosis not present

## 2020-03-10 DIAGNOSIS — Z95828 Presence of other vascular implants and grafts: Secondary | ICD-10-CM

## 2020-03-10 DIAGNOSIS — Z8042 Family history of malignant neoplasm of prostate: Secondary | ICD-10-CM | POA: Insufficient documentation

## 2020-03-10 DIAGNOSIS — Z419 Encounter for procedure for purposes other than remedying health state, unspecified: Secondary | ICD-10-CM

## 2020-03-10 DIAGNOSIS — Z8546 Personal history of malignant neoplasm of prostate: Secondary | ICD-10-CM | POA: Insufficient documentation

## 2020-03-10 DIAGNOSIS — Z9049 Acquired absence of other specified parts of digestive tract: Secondary | ICD-10-CM | POA: Insufficient documentation

## 2020-03-10 DIAGNOSIS — F4321 Adjustment disorder with depressed mood: Secondary | ICD-10-CM | POA: Diagnosis not present

## 2020-03-10 HISTORY — PX: PORTACATH PLACEMENT: SHX2246

## 2020-03-10 SURGERY — INSERTION, TUNNELED CENTRAL VENOUS DEVICE, WITH PORT
Anesthesia: General | Site: Chest | Laterality: Left

## 2020-03-10 MED ORDER — FENTANYL CITRATE (PF) 100 MCG/2ML IJ SOLN
INTRAMUSCULAR | Status: AC
  Filled 2020-03-10: qty 2

## 2020-03-10 MED ORDER — LIDOCAINE-EPINEPHRINE 0.5 %-1:200000 IJ SOLN
INTRAMUSCULAR | Status: DC | PRN
  Administered 2020-03-10: 8 mL

## 2020-03-10 MED ORDER — ACETAMINOPHEN 500 MG PO TABS
1000.0000 mg | ORAL_TABLET | ORAL | Status: AC
  Administered 2020-03-10: 500 mg via ORAL

## 2020-03-10 MED ORDER — PROPOFOL 10 MG/ML IV BOLUS
INTRAVENOUS | Status: DC | PRN
  Administered 2020-03-10: 150 mg via INTRAVENOUS

## 2020-03-10 MED ORDER — DEXAMETHASONE SODIUM PHOSPHATE 4 MG/ML IJ SOLN
INTRAMUSCULAR | Status: DC | PRN
  Administered 2020-03-10: 4 mg via INTRAVENOUS

## 2020-03-10 MED ORDER — PROPOFOL 10 MG/ML IV BOLUS
INTRAVENOUS | Status: AC
  Filled 2020-03-10: qty 20

## 2020-03-10 MED ORDER — CHLORHEXIDINE GLUCONATE CLOTH 2 % EX PADS: 6.0000 | MEDICATED_PAD | Freq: Once | CUTANEOUS | Status: DC

## 2020-03-10 MED ORDER — ONDANSETRON HCL 4 MG/2ML IJ SOLN
INTRAMUSCULAR | Status: DC | PRN
  Administered 2020-03-10: 4 mg via INTRAVENOUS

## 2020-03-10 MED ORDER — LACTATED RINGERS IV SOLN: INTRAVENOUS | Status: DC

## 2020-03-10 MED ORDER — OXYCODONE HCL 5 MG/5ML PO SOLN: 5.0000 mg | Freq: Once | ORAL | Status: DC | PRN

## 2020-03-10 MED ORDER — OXYCODONE HCL 5 MG PO TABS: 5.0000 mg | ORAL_TABLET | ORAL | 0 refills | Status: DC | PRN

## 2020-03-10 MED ORDER — FENTANYL CITRATE (PF) 100 MCG/2ML IJ SOLN: 25.0000 ug | INTRAMUSCULAR | Status: DC | PRN

## 2020-03-10 MED ORDER — HEPARIN SOD (PORK) LOCK FLUSH 100 UNIT/ML IV SOLN
INTRAVENOUS | Status: DC | PRN
  Administered 2020-03-10: 500 [IU] via INTRAVENOUS

## 2020-03-10 MED ORDER — CEFAZOLIN SODIUM-DEXTROSE 2-4 GM/100ML-% IV SOLN
2.0000 g | INTRAVENOUS | Status: AC
  Administered 2020-03-10: 2 g via INTRAVENOUS

## 2020-03-10 MED ORDER — MIDAZOLAM HCL 2 MG/2ML IJ SOLN
INTRAMUSCULAR | Status: AC
  Filled 2020-03-10: qty 2

## 2020-03-10 MED ORDER — BUPIVACAINE HCL (PF) 0.25 % IJ SOLN
INTRAMUSCULAR | Status: DC | PRN
  Administered 2020-03-10: 8 mL

## 2020-03-10 MED ORDER — ENSURE PRE-SURGERY PO LIQD: 296.0000 mL | Freq: Once | ORAL | Status: DC

## 2020-03-10 MED ORDER — OXYCODONE HCL 5 MG PO TABS: 5.0000 mg | ORAL_TABLET | Freq: Once | ORAL | Status: DC | PRN

## 2020-03-10 MED ORDER — HEPARIN (PORCINE) IN NACL 2-0.9 UNITS/ML
INTRAMUSCULAR | Status: AC | PRN
  Administered 2020-03-10: 1 via INTRAVENOUS

## 2020-03-10 MED ORDER — CEFAZOLIN SODIUM-DEXTROSE 2-4 GM/100ML-% IV SOLN
INTRAVENOUS | Status: AC
  Filled 2020-03-10: qty 100

## 2020-03-10 MED ORDER — LIDOCAINE 2% (20 MG/ML) 5 ML SYRINGE
INTRAMUSCULAR | Status: DC | PRN
  Administered 2020-03-10: 80 mg via INTRAVENOUS

## 2020-03-10 MED ORDER — MIDAZOLAM HCL 2 MG/2ML IJ SOLN
1.0000 mg | INTRAMUSCULAR | Status: DC | PRN
  Administered 2020-03-10: 2 mg via INTRAVENOUS

## 2020-03-10 MED ORDER — FENTANYL CITRATE (PF) 100 MCG/2ML IJ SOLN
50.0000 ug | INTRAMUSCULAR | Status: DC | PRN
  Administered 2020-03-10: 100 ug via INTRAVENOUS

## 2020-03-10 MED ORDER — ACETAMINOPHEN 500 MG PO TABS
ORAL_TABLET | ORAL | Status: AC
  Filled 2020-03-10: qty 2

## 2020-03-10 SURGICAL SUPPLY — 43 items
ADH SKN CLS APL DERMABOND .7 (GAUZE/BANDAGES/DRESSINGS) ×1
APL PRP STRL LF DISP 70% ISPRP (MISCELLANEOUS) ×1
BAG DECANTER FOR FLEXI CONT (MISCELLANEOUS) ×3 IMPLANT
BLADE HEX COATED 2.75 (ELECTRODE) ×3 IMPLANT
BLADE SURG 11 STRL SS (BLADE) ×3 IMPLANT
BLADE SURG 15 STRL LF DISP TIS (BLADE) ×1 IMPLANT
BLADE SURG 15 STRL SS (BLADE) ×3
CHLORAPREP W/TINT 26 (MISCELLANEOUS) ×3 IMPLANT
COVER BACK TABLE 60X90IN (DRAPES) ×3 IMPLANT
COVER MAYO STAND STRL (DRAPES) ×3 IMPLANT
COVER WAND RF STERILE (DRAPES) IMPLANT
DECANTER SPIKE VIAL GLASS SM (MISCELLANEOUS) IMPLANT
DERMABOND ADVANCED (GAUZE/BANDAGES/DRESSINGS) ×2
DERMABOND ADVANCED .7 DNX12 (GAUZE/BANDAGES/DRESSINGS) ×1 IMPLANT
DRAPE C-ARM 42X72 X-RAY (DRAPES) ×3 IMPLANT
DRAPE LAPAROTOMY TRNSV 102X78 (DRAPES) ×3 IMPLANT
DRAPE UTILITY XL STRL (DRAPES) ×3 IMPLANT
DRSG TEGADERM 4X4.75 (GAUZE/BANDAGES/DRESSINGS) IMPLANT
ELECT REM PT RETURN 9FT ADLT (ELECTROSURGICAL) ×3
ELECTRODE REM PT RTRN 9FT ADLT (ELECTROSURGICAL) ×1 IMPLANT
GAUZE SPONGE 4X4 12PLY STRL LF (GAUZE/BANDAGES/DRESSINGS) IMPLANT
GLOVE BIO SURGEON STRL SZ 6 (GLOVE) ×3 IMPLANT
GLOVE BIOGEL PI IND STRL 6.5 (GLOVE) ×1 IMPLANT
GLOVE BIOGEL PI INDICATOR 6.5 (GLOVE) ×2
GOWN STRL REUS W/ TWL LRG LVL3 (GOWN DISPOSABLE) ×1 IMPLANT
GOWN STRL REUS W/TWL 2XL LVL3 (GOWN DISPOSABLE) ×3 IMPLANT
GOWN STRL REUS W/TWL LRG LVL3 (GOWN DISPOSABLE) ×3
IV CONNECTOR ONE LINK NDLESS (IV SETS) IMPLANT
KIT PORT POWER 8FR ISP CVUE (Port) ×2 IMPLANT
NDL HYPO 25X1 1.5 SAFETY (NEEDLE) ×1 IMPLANT
NEEDLE HYPO 25X1 1.5 SAFETY (NEEDLE) ×3 IMPLANT
PACK BASIN DAY SURGERY FS (CUSTOM PROCEDURE TRAY) ×3 IMPLANT
PENCIL SMOKE EVACUATOR (MISCELLANEOUS) ×3 IMPLANT
SLEEVE SCD COMPRESS KNEE MED (MISCELLANEOUS) ×3 IMPLANT
SUT MNCRL AB 4-0 PS2 18 (SUTURE) ×3 IMPLANT
SUT PROLENE 2 0 SH DA (SUTURE) ×6 IMPLANT
SUT VIC AB 3-0 SH 27 (SUTURE) ×3
SUT VIC AB 3-0 SH 27X BRD (SUTURE) ×1 IMPLANT
SUT VICRYL 3-0 CR8 SH (SUTURE) IMPLANT
SYR 10ML LL (SYRINGE) ×3 IMPLANT
SYR 5ML LUER SLIP (SYRINGE) ×3 IMPLANT
SYR CONTROL 10ML LL (SYRINGE) ×3 IMPLANT
TOWEL GREEN STERILE FF (TOWEL DISPOSABLE) ×3 IMPLANT

## 2020-03-10 NOTE — Anesthesia Preprocedure Evaluation (Addendum)
Anesthesia Evaluation  Patient identified by MRN, date of birth, ID band Patient awake    Reviewed: Allergy & Precautions, NPO status , Patient's Chart, lab work & pertinent test results  Airway Mallampati: I  TM Distance: >3 FB Neck ROM: Full    Dental no notable dental hx. (+) Teeth Intact, Dental Advisory Given   Pulmonary neg pulmonary ROS,    Pulmonary exam normal breath sounds clear to auscultation       Cardiovascular negative cardio ROS Normal cardiovascular exam Rhythm:Regular Rate:Normal     Neuro/Psych negative neurological ROS  negative psych ROS   GI/Hepatic negative GI ROS, Neg liver ROS,   Endo/Other  negative endocrine ROS  Renal/GU negative Renal ROS  negative genitourinary   Musculoskeletal negative musculoskeletal ROS (+)   Abdominal   Peds  Hematology negative hematology ROS (+)   Anesthesia Other Findings Port placement for appendix cancer  Reproductive/Obstetrics                            Anesthesia Physical Anesthesia Plan  ASA: II  Anesthesia Plan: General   Post-op Pain Management:    Induction: Intravenous  PONV Risk Score and Plan: 2 and Ondansetron, Dexamethasone and Midazolam  Airway Management Planned: LMA  Additional Equipment:   Intra-op Plan:   Post-operative Plan: Extubation in OR  Informed Consent: I have reviewed the patients History and Physical, chart, labs and discussed the procedure including the risks, benefits and alternatives for the proposed anesthesia with the patient or authorized representative who has indicated his/her understanding and acceptance.     Dental advisory given  Plan Discussed with: CRNA  Anesthesia Plan Comments:         Anesthesia Quick Evaluation

## 2020-03-10 NOTE — Interval H&P Note (Signed)
History and Physical Interval Note:  03/10/2020 3:12 PM  Juan Richards  has presented today for surgery, with the diagnosis of APPENDICEAL CANCER.  The various methods of treatment have been discussed with the patient and family. After consideration of risks, benefits and other options for treatment, the patient has consented to  Procedure(s): PORT PLACEMENT (N/A) as a surgical intervention.  The patient's history has been reviewed, patient examined, no change in status, stable for surgery.  I have reviewed the patient's chart and labs.  Questions were answered to the patient's satisfaction.     [name]

## 2020-03-10 NOTE — Transfer of Care (Signed)
Immediate Anesthesia Transfer of Care Note  Patient: Juan Richards  Procedure(s) Performed: PORT PLACEMENT (Left Chest)  Patient Location: PACU  Anesthesia Type:General  Level of Consciousness: awake, alert , oriented and drowsy  Airway & Oxygen Therapy: Patient Spontanous Breathing and Patient connected to face mask oxygen  Post-op Assessment: Report given to RN and Post -op Vital signs reviewed and stable  Post vital signs: Reviewed and stable  Last Vitals:  Vitals Value Taken Time  BP 131/85 03/10/20 1626  Temp    Pulse 92 03/10/20 1627  Resp    SpO2 100 % 03/10/20 1627  Vitals shown include unvalidated device data.  Last Pain:  Vitals:   03/10/20 1436  TempSrc: Tympanic  PainSc: 5       Patients Stated Pain Goal: 5 ([id number])  Complications: No apparent anesthesia complications

## 2020-03-10 NOTE — Anesthesia Procedure Notes (Signed)
Procedure Name: LMA Insertion Date/Time: 03/10/2020 3:33 PM Performed by: Willa Frater, CRNA Pre-anesthesia Checklist: Patient identified, Emergency Drugs available, Suction available and Patient being monitored Patient Re-evaluated:Patient Re-evaluated prior to induction Oxygen Delivery Method: Circle system utilized Preoxygenation: Pre-oxygenation with 100% oxygen Induction Type: IV induction Ventilation: Mask ventilation without difficulty LMA: LMA inserted LMA Size: 5.0 Number of attempts: 1 Airway Equipment and Method: Bite block Placement Confirmation: positive ETCO2 Tube secured with: Tape Dental Injury: Teeth and Oropharynx as per pre-operative assessment

## 2020-03-10 NOTE — Op Note (Signed)
PREOPERATIVE DIAGNOSIS:  Appendiceal cancer with carcinomatosis     POSTOPERATIVE DIAGNOSIS:  Same     PROCEDURE: Left subclavian port placement, Bard ClearVue  Power Port, MRI safe, 8-French.      SURGEON:  [name], MD      ANESTHESIA:  General   FINDINGS:  Good venous return, easy flush, and tip of the catheter and   SVC 24 cm.      SPECIMEN:  None.      ESTIMATED BLOOD LOSS:  Minimal.      COMPLICATIONS:  None known.      PROCEDURE:  Pt was identified in the holding area and taken to   the operating room, where patient was placed supine on the operating room   table.  General anesthesia was induced.  Patient's arms were tucked and the upper   chest and neck were prepped and draped in sterile fashion.  Time-out was   performed according to the surgical safety check list.  When all was   correct, we continued.   Local anesthetic was administered over this   area at the angle of the clavicle.  The vein was accessed with 2 pass(es) of the needle. There was good venous return and the wire passed easily with no ectopy.   Fluoroscopy was used to confirm that the wire was in the vena cava.      The patient was placed back level and the area for the pocket was anethetized   with local anesthetic.  A 3-cm transverse incision was made with a #15   blade.  Cautery was used to divide the subcutaneous tissues down to the   pectoralis muscle.  An [employer] retractor was used to elevate the skin   while a pocket was created on top of the pectoralis fascia.  The port   was placed into the pocket to confirm that it was of adequate size.  The   catheter was preattached to the port.  The port was then secured to the   pectoralis fascia with four 2-0 Prolene sutures.  These were clamped and   not tied down yet.    The catheter was tunneled through to the wire exit   site.  The catheter was placed along the wire to determine what length it should be to be in the SVC.  The catheter was cut  at 24 cm.  The tunneler sheath and dilator were passed over the wire and the dilator and wire were removed.  The catheter was advanced through the tunneler sheath and the tunneler sheath was pulled away.  Care was taken to keep the catheter in the tunneler sheath as this occurred. This was advanced and the tunneler sheath was removed.  There was good venous   return and easy flush of the catheter.  The Prolene sutures were tied   down to the pectoral fascia.  The skin was reapproximated using 3-0   Vicryl interrupted deep dermal sutures.    Fluoroscopy was used to re-confirm good position of the catheter.  The skin   was then closed using 4-0 Monocryl in a subcuticular fashion.  The port was flushed with concentrated heparin flush as well.  The wounds were then cleaned, dried, and dressed with Dermabond.  The patient was awakened from anesthesia and taken to the PACU in stable condition.  Needle, sponge, and instrument counts were correct.               [name], MD

## 2020-03-10 NOTE — Discharge Instructions (Signed)
  Post Anesthesia Home Care Instructions  Activity: Get plenty of rest for the remainder of the day. A responsible individual must stay with you for 24 hours following the procedure.  For the next 24 hours, DO NOT: -Drive a car -Paediatric nurse -Drink alcoholic beverages -Take any medication unless instructed by your physician -Make any legal decisions or sign important papers.  Meals: Start with liquid foods such as gelatin or soup. Progress to regular foods as tolerated. Avoid greasy, spicy, heavy foods. If nausea and/or vomiting occur, drink only clear liquids until the nausea and/or vomiting subsides. Call your physician if vomiting continues.  Next dose of Tylenol can be taken at 9pm today (03/10/2020) if needed   Special Instructions/Symptoms: Your throat may feel dry or sore from the anesthesia or the breathing tube placed in your throat during surgery. If this causes discomfort, gargle with warm salt water. The discomfort should disappear within 24 hours.  If you had a scopolamine patch placed behind your ear for the management of post- operative nausea and/or vomiting:  1. The medication in the patch is effective for 72 hours, after which it should be removed.  Wrap patch in a tissue and discard in the trash. Wash hands thoroughly with soap and water. 2. You may remove the patch earlier than 72 hours if you experience unpleasant side effects which may include dry mouth, dizziness or visual disturbances. 3. Avoid touching the patch. Wash your hands with soap and water after contact with the patch.

## 2020-03-11 ENCOUNTER — Encounter: Payer: Self-pay | Admitting: *Deleted

## 2020-03-11 NOTE — Anesthesia Postprocedure Evaluation (Signed)
Anesthesia Post Note  Patient: Juan Richards  Procedure(s) Performed: PORT PLACEMENT (Left Chest)     Patient location during evaluation: PACU Anesthesia Type: General Level of consciousness: awake and alert Pain management: pain level controlled Vital Signs Assessment: post-procedure vital signs reviewed and stable Respiratory status: spontaneous breathing, nonlabored ventilation, respiratory function stable and patient connected to nasal cannula oxygen Cardiovascular status: blood pressure returned to baseline and stable Postop Assessment: no apparent nausea or vomiting Anesthetic complications: no    Last Vitals:  Vitals:   03/10/20 1715 03/10/20 1732  BP: 130/80 117/83  Pulse: 79 74  Resp: 16   Temp:  36.6 C  SpO2: 100% 100%    Last Pain:  Vitals:   03/11/20 0957  TempSrc:   PainSc: 3    Pain Goal: Patients Stated Pain Goal: 5 (03/10/20 1436)                 Anissa Abbs L Maeci Kalbfleisch

## 2020-03-12 ENCOUNTER — Other Ambulatory Visit: Payer: Self-pay

## 2020-03-12 ENCOUNTER — Ambulatory Visit (HOSPITAL_COMMUNITY)
Admission: RE | Admit: 2020-03-12 | Discharge: 2020-03-12 | Disposition: A | Discharge status: Home / Self Care | Payer: 59 | Source: Ambulatory Visit | Attending: Oncology | Admitting: Oncology

## 2020-03-12 DIAGNOSIS — D373 Neoplasm of uncertain behavior of appendix: Secondary | ICD-10-CM | POA: Diagnosis not present

## 2020-03-12 MED ORDER — SODIUM CHLORIDE (PF) 0.9 % IJ SOLN
INTRAMUSCULAR | Status: AC
  Filled 2020-03-12: qty 50

## 2020-03-12 MED ORDER — IOHEXOL 300 MG/ML  SOLN
100.0000 mL | Freq: Once | INTRAMUSCULAR | Status: AC | PRN
  Administered 2020-03-12: 100 mL via INTRAVENOUS

## 2020-03-16 ENCOUNTER — Ambulatory Visit
Admission: RE | Admit: 2020-03-16 | Discharge: 2020-03-16 | Disposition: A | Discharge status: Home / Self Care | Payer: 59 | Source: Ambulatory Visit | Attending: General Surgery | Admitting: General Surgery

## 2020-03-16 ENCOUNTER — Other Ambulatory Visit: Payer: Self-pay

## 2020-03-16 ENCOUNTER — Ambulatory Visit (HOSPITAL_COMMUNITY): Payer: 59

## 2020-03-16 ENCOUNTER — Other Ambulatory Visit: Payer: Self-pay | Admitting: General Surgery

## 2020-03-16 DIAGNOSIS — Z95828 Presence of other vascular implants and grafts: Secondary | ICD-10-CM

## 2020-03-19 ENCOUNTER — Inpatient Hospital Stay: Payer: 59

## 2020-03-19 ENCOUNTER — Other Ambulatory Visit: Payer: 59

## 2020-03-19 ENCOUNTER — Other Ambulatory Visit: Payer: Self-pay

## 2020-03-19 ENCOUNTER — Inpatient Hospital Stay: Payer: 59 | Attending: Oncology | Admitting: Oncology

## 2020-03-19 ENCOUNTER — Encounter (HOSPITAL_COMMUNITY): Payer: Self-pay

## 2020-03-19 ENCOUNTER — Other Ambulatory Visit: Payer: Self-pay | Admitting: *Deleted

## 2020-03-19 ENCOUNTER — Telehealth: Payer: Self-pay | Admitting: *Deleted

## 2020-03-19 ENCOUNTER — Encounter (HOSPITAL_COMMUNITY): Payer: Self-pay | Admitting: Oncology

## 2020-03-19 ENCOUNTER — Ambulatory Visit: Payer: 59 | Admitting: Oncology

## 2020-03-19 VITALS — BP 94/73 | HR 87 | Temp 97.9°F | Resp 17 | Ht 69.0 in | Wt 185.8 lb

## 2020-03-19 DIAGNOSIS — Z5189 Encounter for other specified aftercare: Secondary | ICD-10-CM | POA: Diagnosis not present

## 2020-03-19 DIAGNOSIS — C181 Malignant neoplasm of appendix: Secondary | ICD-10-CM | POA: Insufficient documentation

## 2020-03-19 DIAGNOSIS — E860 Dehydration: Secondary | ICD-10-CM

## 2020-03-19 DIAGNOSIS — Z8616 Personal history of COVID-19: Secondary | ICD-10-CM | POA: Insufficient documentation

## 2020-03-19 DIAGNOSIS — C786 Secondary malignant neoplasm of retroperitoneum and peritoneum: Secondary | ICD-10-CM | POA: Diagnosis present

## 2020-03-19 DIAGNOSIS — D649 Anemia, unspecified: Secondary | ICD-10-CM | POA: Diagnosis not present

## 2020-03-19 DIAGNOSIS — I951 Orthostatic hypotension: Secondary | ICD-10-CM | POA: Insufficient documentation

## 2020-03-19 DIAGNOSIS — Z8546 Personal history of malignant neoplasm of prostate: Secondary | ICD-10-CM | POA: Diagnosis not present

## 2020-03-19 DIAGNOSIS — Z5111 Encounter for antineoplastic chemotherapy: Secondary | ICD-10-CM | POA: Diagnosis not present

## 2020-03-19 LAB — MAGNESIUM: Magnesium: 1 mg/dL — CL (ref 1.7–2.4)

## 2020-03-19 LAB — CMP (CANCER CENTER ONLY)
ALT: 20 U/L (ref 0–44)
AST: 14 U/L — ABNORMAL LOW (ref 15–41)
Albumin: 3.8 g/dL (ref 3.5–5.0)
Alkaline Phosphatase: 84 U/L (ref 38–126)
Anion gap: 11 (ref 5–15)
BUN: 13 mg/dL (ref 8–23)
CO2: 19 mmol/L — ABNORMAL LOW (ref 22–32)
Calcium: 10.3 mg/dL (ref 8.9–10.3)
Chloride: 106 mmol/L (ref 98–111)
Creatinine: 0.82 mg/dL (ref 0.61–1.24)
GFR, Est AFR Am: >60 mL/min (ref >60)
GFR, Estimated: >60 mL/min (ref >60)
Glucose, Bld: 95 mg/dL (ref 70–99)
Potassium: 3.9 mmol/L (ref 3.5–5.1)
Sodium: 136 mmol/L (ref 135–145)
Total Bilirubin: 0.3 mg/dL (ref 0.3–1.2)
Total Protein: 7.9 g/dL (ref 6.5–8.1)

## 2020-03-19 MED ORDER — MAGNESIUM SULFATE 4 GM/100ML IV SOLN
4.0000 g | Freq: Once | INTRAVENOUS | Status: AC
  Administered 2020-03-19: 4 g via INTRAVENOUS
  Filled 2020-03-19: qty 100

## 2020-03-19 MED ORDER — SODIUM CHLORIDE 0.9 % IV SOLN
INTRAVENOUS | Status: AC
  Filled 2020-03-19 (×2): qty 250

## 2020-03-19 NOTE — Progress Notes (Signed)
Instructed patient that if Imodium 2 mg qid does not slow down his stool output by tomorrow, to increase to 4 mg qid. He was hesitant about doing this for fear of constipation. Attempted to reassure him that it is unlikely that constipation will be an issue for him.

## 2020-03-19 NOTE — Progress Notes (Signed)
[place] OFFICE PROGRESS NOTE   Diagnosis: Appendiceal carcinoma  INTERVAL HISTORY:   [name] returns as scheduled.  The abdominal wound is healing.  He reports a large volume of output from the ileostomy.  He he empties the bag 8-10 times per day.  He continues to have mucoid output from the mucous fistula. He underwent Port-A-Cath placement by Dr. Barry Richards on 03/10/2020.  He has noted presyncope symptoms since the Port-A-Cath was placed.  He reports feeling as though he is going to pass out when he changes position.  This occurs especially when he moves quickly.  No associated symptoms. He is not taking Imodium or other antidiarrheal agents. Objective:  Vital signs in last 24 hours:  Blood pressure 94/73, pulse 87, temperature 97.9 F (36.6 C), temperature source Temporal, resp. rate 17, height '5\' 9"'$  (1.753 m), weight 185 lb 12.8 oz (84.3 kg), SpO2 100 %.    HEENT: The mucous membranes are moist Resp: Lungs clear bilaterally Cardio: Regular rate and rhythm GI: No hepatosplenomegaly, right lower quadrant ileostomy with liquid stool, left abdomen mucous fistula, the midline wound has almost completely healed Vascular: No leg edema, diminished skin turgor    Portacath/PICC-without erythema  Lab Results:  Lab Results  Component Value Date   WBC 5.0 03/02/2020   HGB 10.7 (L) 03/02/2020   HCT 32.8 (L) 03/02/2020   MCV 91.4 03/02/2020   PLT 295 03/02/2020   NEUTROABS 3.0 03/02/2020    CMP  Lab Results  Component Value Date   NA 136 03/19/2020   K 3.9 03/19/2020   CL 106 03/19/2020   CO2 19 (L) 03/19/2020   GLUCOSE 95 03/19/2020   BUN 13 03/19/2020   CREATININE 0.82 03/19/2020   CALCIUM 10.3 03/19/2020   PROT 7.9 03/19/2020   ALBUMIN 3.8 03/19/2020   AST 14 (L) 03/19/2020   ALT 20 03/19/2020   ALKPHOS 84 03/19/2020   BILITOT 0.3 03/19/2020   GFRNONAA >60 03/19/2020   GFRAA >60 03/19/2020    Lab Results  Component Value Date   CEA1 1.53  03/02/2020    Imaging:  DG Chest 2 View  Result Date: 03/16/2020 CLINICAL DATA:  Dizziness, lightheadedness, recent Port-A-Cath placement, appendiceal carcinoma EXAM: CHEST - 2 VIEW COMPARISON:  03/10/2020, 03/12/2020 FINDINGS: Frontal and lateral views of the chest demonstrate stable left chest wall port tip within the superior vena cava. Cardiac silhouette is unremarkable. No airspace disease, effusion, or pneumothorax. No acute bony abnormality. IMPRESSION: 1. No acute intrathoracic process. Electronically Signed   By: Juan Richards M.D.   On: 03/16/2020 23:29    Medications: I have reviewed the patient's current medications.   Assessment/Plan:  1. 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, 12 tumor deposits, 0/10 lymph nodes positive, negative resection margins, metastatic carcinoma involving the ileal mesentery noted, low tumor purity  with decreased sensitivity for detection of HER-2 copy number and other alterations, microsatellite status and tumor mutation burden could not be determined, MSS, no loss of mismatch repair protein expression                        -  Diffuse subcentimeter nodularity over the mesentery and bowel, sigmoid colon adherent to bladder and left middle pelvic sidewall, gross contamination with succus            -CTs 03/12/2020-no chest metastases, long segment of sigmoid colon thickening, no discrete peritoneal nodule, nodular anterior bladder surface  2. Prostate cancer diagnosed in August 2019, robotic assisted prostatectomy 09/21/2018-Gleason 7 acinar tumor,pT3pN1, invasion of seminal vesicles, 3/16 lymph nodes positive, positive right anterior margin positive              -Gleason score 4+3 equal 7 and PSA of 60.97.               -Staging work-up did not show any evidence of metastatic disease.              -Bone scan 08/17/2018-negative for metastatic disease             -Elevated PSA July 2019             -PET scan scan 10/15/2019-negative for metastatic disease, evidence of sigmoid diverticulitis with a small contained perforation             -Radiation-definitive, 10/29/2019-01/15/2020, interrupted 11/22/2019-12/17/2019 secondary to COVID-19 infection  3. Anemia 4. Deconditioning 5.  COVID-19 infection December 2020 6.  Orthostatic hypotension 03/19/2020 secondary to high output ileostomy 7.  Hypomagnesemia secondary to high-output ileostomy, repleted IV 03/19/2020    Disposition: [name] continues to recover from the abdominal surgery.  The restaging CTs reveal no clear evidence of measurable disease, though the thickening at the sigmoid colon and anterior bladder wall nodularity may be secondary to carcinomatosis with surface seeding of tumor.  He is scheduled to see Dr. Clovis Richards tomorrow.  He is scheduled for chemotherapy teaching class today in anticipation of beginning FOLFOX soon.  The tumor is microsatellite stable.  He will not be a candidate for immunotherapy.  He appears dehydrated today.  I suspect this explains the presyncope symptoms.  He will be treated with intravenous fluids and magnesium.  He will return for an office and lab visit on 03/24/2020.  I encouraged him to begin Imodium 4 times daily.  We will contact the surgical office for further recommendations to manage the high-output ileostomy.  I reviewed the CT images.  Juan Coder, MD  03/19/2020  1:24 PM

## 2020-03-19 NOTE — Telephone Encounter (Addendum)
Mg+ level today returned at 1.0. Per Dr. Benay Spice, needs 4 grams IV Mg sulfate today. Have called patient and wife and left VM to come to office today at 2 pm for infusion. Expressed that this is urgent and can't wait till tomorrow. Will recheck CMP/Mg+ on 03/24/20 visit. Patient called back at to 12:42 and confirmed he will return for infusion at 2 pm today.

## 2020-03-19 NOTE — Progress Notes (Signed)
Received critical lab Mg 1.0.  Verbally communicated to S. Coward, RN with repeat back @ 11:39am

## 2020-03-19 NOTE — Patient Instructions (Signed)
Dehydration, Adult Dehydration is condition in which there is not enough water or other fluids in the body. This happens when a person loses more fluids than he or she takes in. Important body parts cannot work right without the right amount of fluids. Any loss of fluids from the body can cause dehydration. Dehydration can be mild, worse, or very bad. It should be treated right away to keep it from getting very bad. What are the causes? This condition may be caused by:  Conditions that cause loss of water or other fluids, such as: ? Watery poop (diarrhea). ? Vomiting. ? Sweating a lot. ? Peeing (urinating) a lot.  Not drinking enough fluids, especially when you: ? Are ill. ? Are doing things that take a lot of energy to do.  Other illnesses and conditions, such as fever or infection.  Certain medicines, such as medicines that take extra fluid out of the body (diuretics).  Lack of safe drinking water.  Not being able to get enough water and food. What increases the risk? The following factors may make you more likely to develop this condition:  Having a long-term (chronic) illness that has not been treated the right way, such as: ? Diabetes. ? Heart disease. ? Kidney disease.  Being 65 years of age or older.  Having a disability.  Living in a place that is high above the ground or sea (high in altitude). The thinner, dried air causes more fluid loss.  Doing exercises that put stress on your body for a long time. What are the signs or symptoms? Symptoms of dehydration depend on how bad it is. Mild or worse dehydration  Thirst.  Dry lips or dry mouth.  Feeling dizzy or light-headed, especially when you stand up from sitting.  Muscle cramps.  Your body making: ? Dark pee (urine). Pee may be the color of tea. ? Less pee than normal. ? Less tears than normal.  Headache. Very bad dehydration  Changes in skin. Skin may: ? Be cold to the touch (clammy). ? Be blotchy  or pale. ? Not go back to normal right after you lightly pinch it and let it go.  Little or no tears, pee, or sweat.  Changes in vital signs, such as: ? Fast breathing. ? Low blood pressure. ? Weak pulse. ? Pulse that is more than 100 beats a minute when you are sitting still.  Other changes, such as: ? Feeling very thirsty. ? Eyes that look hollow (sunken). ? Cold hands and feet. ? Being mixed up (confused). ? Being very tired (lethargic) or having trouble waking from sleep. ? Short-term weight loss. ? Loss of consciousness. How is this treated? Treatment for this condition depends on how bad it is. Treatment should start right away. Do not wait until your condition gets very bad. Very bad dehydration is an emergency. You will need to go to a hospital.  Mild or worse dehydration can be treated at home. You may be asked to: ? Drink more fluids. ? Drink an oral rehydration solution (ORS). This drink helps get the right amounts of fluids and salts and minerals in the blood (electrolytes).  Very bad dehydration can be treated: ? With fluids through an IV tube. ? By getting normal levels of salts and minerals in your blood. This is often done by giving salts and minerals through a tube. The tube is passed through your nose and into your stomach. ? By treating the root cause. Follow these instructions at   home: Oral rehydration solution If told by your doctor, drink an ORS:  Make an ORS. Use instructions on the package.  Start by drinking small amounts, about  cup (120 mL) every 5-10 minutes.  Slowly drink more until you have had the amount that your doctor said to have. Eating and drinking         Drink enough clear fluid to keep your pee pale yellow. If you were told to drink an ORS, finish the ORS first. Then, start slowly drinking other clear fluids. Drink fluids such as: ? Water. Do not drink only water. Doing that can make the salt (sodium) level in your body get too  low. ? Water from ice chips you suck on. ? Fruit juice that you have added water to (diluted). ? Low-calorie sports drinks.  Eat foods that have the right amounts of salts and minerals, such as: ? Bananas. ? Oranges. ? Potatoes. ? Tomatoes. ? Spinach.  Do not drink alcohol.  Avoid: ? Drinks that have a lot of sugar. These include:  High-calorie sports drinks.  Fruit juice that you did not add water to.  Soda.  Caffeine. ? Foods that are greasy or have a lot of fat or sugar. General instructions  Take over-the-counter and prescription medicines only as told by your doctor.  Do not take salt tablets. Doing that can make the salt level in your body get too high.  Return to your normal activities as told by your doctor. Ask your doctor what activities are safe for you.  Keep all follow-up visits as told by your doctor. This is important. Contact a doctor if:  You have pain in your belly (abdomen) and the pain: ? Gets worse. ? Stays in one place.  You have a rash.  You have a stiff neck.  You get angry or annoyed (irritable) more easily than normal.  You are more tired or have a harder time waking than normal.  You feel: ? Weak or dizzy. ? Very thirsty. Get help right away if you have:  Any symptoms of very bad dehydration.  Symptoms of vomiting, such as: ? You cannot eat or drink without vomiting. ? Your vomiting gets worse or does not go away. ? Your vomit has blood or green stuff in it.  Symptoms that get worse with treatment.  A fever.  A very bad headache.  Problems with peeing or pooping (having a bowel movement), such as: ? Watery poop that gets worse or does not go away. ? Blood in your poop (stool). This may cause poop to look black and tarry. ? Not peeing in 6-8 hours. ? Peeing only a small amount of very dark pee in 6-8 hours.  Trouble breathing. These symptoms may be an emergency. Do not wait to see if the symptoms will go away. Get  medical help right away. Call your local emergency services (911 in the [place] Do not drive yourself to the hospital. Summary  Dehydration is a condition in which there is not enough water or other fluids in the body. This happens when a person loses more fluids than he or she takes in.  Treatment for this condition depends on how bad it is. Treatment should be started right away. Do not wait until your condition gets very bad.  Drink enough clear fluid to keep your pee pale yellow. If you were told to drink an oral rehydration solution (ORS), finish the ORS first. Then, start slowly drinking other clear fluids.    Take over-the-counter and prescription medicines only as told by your doctor.  Get help right away if you have any symptoms of very bad dehydration. This information is not intended to replace advice given to you by your health care provider. Make sure you discuss any questions you have with your health care provider. Document Revised: 07/18/2019 Document Reviewed: 07/18/2019 Elsevier Patient Education  2020 [employer] Inc[employer]    Hypomagnesemia Hypomagnesemia is a condition in which the level of magnesium in the blood is low. Magnesium is a mineral that is found in many foods. It is used in many different processes in the body. Hypomagnesemia can affect every organ in the body. In severe cases, it can cause life-threatening problems. What are the causes? This condition may be caused by:  Not getting enough magnesium in your diet.  Malnutrition.  Problems with absorbing magnesium from the intestines.  Dehydration.  Alcohol abuse.  Vomiting.  Severe or chronic diarrhea.  Some medicines, including medicines that make you urinate more (diuretics).  Certain diseases, such as kidney disease, diabetes, celiac disease, and overactive thyroid. What are the signs or symptoms? Symptoms of this condition include:  Loss of appetite.  Nausea and vomiting.  Involuntary shaking or  trembling of a body part (tremor).  Muscle weakness.  Tingling in the arms and legs.  Sudden tightening of muscles (muscle spasms).  Confusion.  Psychiatric issues, such as depression, irritability, or psychosis.  A feeling of fluttering of the heart.  Seizures. These symptoms are more severe if magnesium levels drop suddenly. How is this diagnosed? This condition may be diagnosed based on:  Your symptoms and medical history.  A physical exam.  Blood and urine tests. How is this treated? Treatment depends on the cause and the severity of the condition. It may be treated with:  A magnesium supplement. This can be taken in pill form. If the condition is severe, magnesium is usually given through an IV.  Changes to your diet. You may be directed to eat foods that have a lot of magnesium, such as green leafy vegetables, peas, beans, and nuts.  Stopping any intake of alcohol. Follow these instructions at home:      Make sure that your diet includes foods with magnesium. Foods that have a lot of magnesium in them include: ? Green leafy vegetables, such as spinach and broccoli. ? Beans and peas. ? Nuts and seeds, such as almonds and sunflower seeds. ? Whole grains, such as whole grain bread and fortified cereals.  Take magnesium supplements if your health care provider tells you to do that. Take them as directed.  Take over-the-counter and prescription medicines only as told by your health care provider.  Have your magnesium levels monitored as told by your health care provider.  When you are active, drink fluids that contain electrolytes.  Avoid drinking alcohol.  Keep all follow-up visits as told by your health care provider. This is important. Contact a health care provider if:  You get worse instead of better.  Your symptoms return. Get help right away if you:  Develop severe muscle weakness.  Have trouble breathing.  Feel that your heart is  racing. Summary  Hypomagnesemia is a condition in which the level of magnesium in the blood is low.  Hypomagnesemia can affect every organ in the body.  Treatment may include eating more foods that contain magnesium, taking magnesium supplements, and not drinking alcohol.  Have your magnesium levels monitored as told by your health care provider. This   information is not intended to replace advice given to you by your health care provider. Make sure you discuss any questions you have with your health care provider. Document Revised: 11/17/2017 Document Reviewed: 11/06/2017 Elsevier Patient Education  2020 Elsevier Inc.  

## 2020-03-19 NOTE — Progress Notes (Signed)
Pt received 1L IVF NS and IV magnesium today, tolerated well.  VSS.  Denies any questions/concerns at time of d/c.  Able to eat/drink/use restroom during tx without any issues.

## 2020-03-20 ENCOUNTER — Telehealth: Payer: Self-pay | Admitting: Oncology

## 2020-03-20 NOTE — Telephone Encounter (Signed)
Scheduled per los. Called and spoke with patient. Confirmed appt 

## 2020-03-24 ENCOUNTER — Inpatient Hospital Stay: Payer: 59

## 2020-03-24 ENCOUNTER — Other Ambulatory Visit: Payer: Self-pay | Admitting: *Deleted

## 2020-03-24 ENCOUNTER — Telehealth: Payer: Self-pay

## 2020-03-24 ENCOUNTER — Encounter: Payer: Self-pay | Admitting: Nurse Practitioner

## 2020-03-24 ENCOUNTER — Inpatient Hospital Stay (HOSPITAL_BASED_OUTPATIENT_CLINIC_OR_DEPARTMENT_OTHER): Payer: 59 | Admitting: Nurse Practitioner

## 2020-03-24 ENCOUNTER — Other Ambulatory Visit: Payer: Self-pay

## 2020-03-24 VITALS — BP 112/66 | HR 87 | Temp 98.2°F | Resp 18 | Ht 69.0 in | Wt 189.1 lb

## 2020-03-24 DIAGNOSIS — Z5111 Encounter for antineoplastic chemotherapy: Secondary | ICD-10-CM | POA: Diagnosis not present

## 2020-03-24 DIAGNOSIS — C181 Malignant neoplasm of appendix: Secondary | ICD-10-CM | POA: Diagnosis not present

## 2020-03-24 DIAGNOSIS — E860 Dehydration: Secondary | ICD-10-CM

## 2020-03-24 LAB — CMP (CANCER CENTER ONLY)
ALT: 15 U/L (ref 0–44)
AST: 13 U/L — ABNORMAL LOW (ref 15–41)
Albumin: 3.8 g/dL (ref 3.5–5.0)
Alkaline Phosphatase: 77 U/L (ref 38–126)
Anion gap: 12 (ref 5–15)
BUN: 10 mg/dL (ref 8–23)
CO2: 21 mmol/L — ABNORMAL LOW (ref 22–32)
Calcium: 10.4 mg/dL — ABNORMAL HIGH (ref 8.9–10.3)
Chloride: 107 mmol/L (ref 98–111)
Creatinine: 0.82 mg/dL (ref 0.61–1.24)
GFR, Est AFR Am: >60 mL/min (ref >60)
GFR, Estimated: >60 mL/min (ref >60)
Glucose, Bld: 113 mg/dL — ABNORMAL HIGH (ref 70–99)
Potassium: 3.9 mmol/L (ref 3.5–5.1)
Sodium: 140 mmol/L (ref 135–145)
Total Bilirubin: 0.3 mg/dL (ref 0.3–1.2)
Total Protein: 7.8 g/dL (ref 6.5–8.1)

## 2020-03-24 LAB — MAGNESIUM: Magnesium: 1.2 mg/dL — CL (ref 1.7–2.4)

## 2020-03-24 MED ORDER — SODIUM CHLORIDE 0.9 % IV SOLN
Freq: Once | INTRAVENOUS | Status: AC
  Filled 2020-03-24: qty 1000

## 2020-03-24 MED ORDER — LIDOCAINE-PRILOCAINE 2.5-2.5 % EX CREA: 1.0000 "application " | TOPICAL_CREAM | CUTANEOUS | 0 refills | Status: AC | PRN

## 2020-03-24 NOTE — Progress Notes (Addendum)
[place] OFFICE PROGRESS NOTE   Diagnosis: Appendiceal carcinoma  INTERVAL HISTORY:   Mr. Tiso returns as scheduled.  Overall he is feeling better.  He has having less orthostatic symptoms.  He reports a good appetite, good fluid intake.  He is taking 3-4 Imodium tablets a day.  He notes stool is thicker.  He estimates emptying the ostomy collection bag 6-8 times a day.  He feels nauseated intermittently.  No vomiting.  Abdominal wound is "healing".  Objective:  Vital signs in last 24 hours:  Blood pressure 112/66, pulse 87, temperature 98.2 F (36.8 C), temperature source Temporal, resp. rate 18, height 5' 9" (1.753 m), weight 189 lb 1.6 oz (85.8 kg), SpO2 100 %.    HEENT: White coating over tongue.  Membranes appear moist. Resp: Lungs clear bilaterally. Cardio: Regular rate and rhythm. GI: No hepatomegaly.  Right lower quadrant ileostomy with thick liquid stool.  Left abdomen mucous fistula.  Midline wound is healing. Vascular: No leg edema. Neuro: Alert and oriented. Port-A-Cath without erythema.  Lab Results:  Lab Results  Component Value Date   WBC 5.0 03/02/2020   HGB 10.7 (L) 03/02/2020   HCT 32.8 (L) 03/02/2020   MCV 91.4 03/02/2020   PLT 295 03/02/2020   NEUTROABS 3.0 03/02/2020    Imaging:  No results found.  Medications: I have reviewed the patient's current medications.  Assessment/Plan: 1. 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, 12 tumor deposits, 0/10 lymph nodes positive, negative resection margins, metastatic carcinoma involving the ileal mesentery noted, low tumor purity  with decreased sensitivity for detection of HER-2 copy number  and other alterations, microsatellite status and tumor mutation burden could not be determined, MSS, no loss of mismatch repair protein expression            -Diffuse subcentimeter nodularity over the mesentery and bowel, sigmoid colon adherent to bladder and left middle pelvic sidewall, gross contaminationwith succus            -CTs 03/12/2020-no chest metastases, long segment of sigmoid colon thickening, no discrete peritoneal nodule, nodular anterior bladder surface  2.Prostate cancer diagnosed in August 2019, robotic assisted prostatectomy 09/21/2018-Gleason 7 acinar tumor,pT3pN1,invasion of seminal vesicles, 3/16 lymph nodes positive, positive right anterior margin positive -Gleason score 4+3 equal 7 and PSA of 60.97. -Staging work-up did not show any evidence of metastatic disease. -Bone scan 08/17/2018-negative for metastatic disease -Elevated PSA July 2019 -PET scanscan 10/15/2019-negative for metastatic disease, evidence of sigmoid diverticulitis with a small contained perforation -Radiation-definitive, 10/29/2019-01/15/2020, interrupted 11/22/2019-12/17/2019 secondary to COVID-19 infection  3. Anemia 4. Deconditioning 5.COVID-19 infection December 2020 6.  Orthostatic hypotension 03/19/2020 secondary to high output ileostomy 7.  Hypomagnesemia secondary to high-output ileostomy, repleted IV 03/19/2020   Disposition: Mr. Foti appears unchanged.  He saw Dr. [name] on 03/20/2020 with a recommendation for chemotherapy followed by possible HIPEC.  Dr. Benay Spice recommends FOLFOX initially, consider adding irinotecan pending tolerance of FOLFOX.  We reviewed potential toxicities associated with chemotherapy including bone marrow toxicity, nausea, hair loss.  We reviewed the potential neurotoxicities associated with oxaliplatin.  We reviewed potential toxicities associated with 5-fluorouracil including mouth  sores, diarrhea, hand-foot syndrome, skin rash, skin hyperpigmentation.  He agrees to proceed.  We reviewed the labs from today.  He has persistent hypomagnesemia.  He will receive IV magnesium today.    He will continue Imodium for the high output ileostomy.  He will return for lab, follow-up, possible FOLFOX on 04/01/2020.  He will contact the office in the interim with any problems.  Patient seen with Dr. Benay Spice.    Ned Card ANP/GNP-BC   03/24/2020  9:45 AM This was a shared visit with Ned Card.  Mr. Bublitz continues to recover from the abdominal surgery.  He is now taking Imodium.  He required intravenous fluids and magnesium supplementation when he was here last week.  Dr. [name] recommends systemic therapy prior to cytoreductive surgery.  He is not a candidate for bevacizumab at present.  I am reluctant to treat with FOLFOXIRI with the high-output ileostomy.  We will begin treatment with FOLFOX.  We reviewed potential toxicities associated with FOLFOX.  He agrees to proceed.  He will attend a chemotherapy teaching class.  A chemotherapy plan was entered today.  Julieanne Manson, MD

## 2020-03-24 NOTE — Progress Notes (Signed)
Pt declined to use chest port for IVF/magnesium infusion today, opted for PIV.  Tolerated well, VSS.  Able to drink fluids and ambulate to restroom during tx, declined anything to eat.  NP Lattie Haw to send in prescription for EMLA cream for pt's upcoming infusion appts next week for chest port.  Pt aware to allow port to heal before applying cream.

## 2020-03-24 NOTE — Patient Instructions (Signed)
Hypomagnesemia Hypomagnesemia is a condition in which the level of magnesium in the blood is low. Magnesium is a mineral that is found in many foods. It is used in many different processes in the body. Hypomagnesemia can affect every organ in the body. In severe cases, it can cause life-threatening problems. What are the causes? This condition may be caused by:  Not getting enough magnesium in your diet.  Malnutrition.  Problems with absorbing magnesium from the intestines.  Dehydration.  Alcohol abuse.  Vomiting.  Severe or chronic diarrhea.  Some medicines, including medicines that make you urinate more (diuretics).  Certain diseases, such as kidney disease, diabetes, celiac disease, and overactive thyroid. What are the signs or symptoms? Symptoms of this condition include:  Loss of appetite.  Nausea and vomiting.  Involuntary shaking or trembling of a body part (tremor).  Muscle weakness.  Tingling in the arms and legs.  Sudden tightening of muscles (muscle spasms).  Confusion.  Psychiatric issues, such as depression, irritability, or psychosis.  A feeling of fluttering of the heart.  Seizures. These symptoms are more severe if magnesium levels drop suddenly. How is this diagnosed? This condition may be diagnosed based on:  Your symptoms and medical history.  A physical exam.  Blood and urine tests. How is this treated? Treatment depends on the cause and the severity of the condition. It may be treated with:  A magnesium supplement. This can be taken in pill form. If the condition is severe, magnesium is usually given through an IV.  Changes to your diet. You may be directed to eat foods that have a lot of magnesium, such as green leafy vegetables, peas, beans, and nuts.  Stopping any intake of alcohol. Follow these instructions at home:      Make sure that your diet includes foods with magnesium. Foods that have a lot of magnesium in them  include: ? Green leafy vegetables, such as spinach and broccoli. ? Beans and peas. ? Nuts and seeds, such as almonds and sunflower seeds. ? Whole grains, such as whole grain bread and fortified cereals.  Take magnesium supplements if your health care provider tells you to do that. Take them as directed.  Take over-the-counter and prescription medicines only as told by your health care provider.  Have your magnesium levels monitored as told by your health care provider.  When you are active, drink fluids that contain electrolytes.  Avoid drinking alcohol.  Keep all follow-up visits as told by your health care provider. This is important. Contact a health care provider if:  You get worse instead of better.  Your symptoms return. Get help right away if you:  Develop severe muscle weakness.  Have trouble breathing.  Feel that your heart is racing. Summary  Hypomagnesemia is a condition in which the level of magnesium in the blood is low.  Hypomagnesemia can affect every organ in the body.  Treatment may include eating more foods that contain magnesium, taking magnesium supplements, and not drinking alcohol.  Have your magnesium levels monitored as told by your health care provider. This information is not intended to replace advice given to you by your health care provider. Make sure you discuss any questions you have with your health care provider. Document Revised: 11/17/2017 Document Reviewed: 11/06/2017 Elsevier Patient Education  2020 Elsevier Inc.  

## 2020-03-24 NOTE — Progress Notes (Signed)
Magnesium 1.2. Provider Ned Card, NP, made aware

## 2020-03-26 ENCOUNTER — Telehealth: Payer: Self-pay | Admitting: Nurse Practitioner

## 2020-03-26 ENCOUNTER — Other Ambulatory Visit: Payer: Self-pay | Admitting: *Deleted

## 2020-03-26 MED ORDER — PROCHLORPERAZINE MALEATE 10 MG PO TABS: 10.0000 mg | ORAL_TABLET | Freq: Four times a day (QID) | ORAL | 1 refills | Status: DC | PRN

## 2020-03-26 NOTE — Telephone Encounter (Signed)
Scheduled appt per 4/6 los.  Spoke with pt and he is aware of his scheduled appt date and time.

## 2020-03-29 ENCOUNTER — Other Ambulatory Visit: Payer: Self-pay | Admitting: Oncology

## 2020-03-30 ENCOUNTER — Ambulatory Visit: Payer: 59 | Attending: Internal Medicine

## 2020-03-30 DIAGNOSIS — Z23 Encounter for immunization: Secondary | ICD-10-CM

## 2020-03-30 NOTE — Progress Notes (Signed)
   Covid-19 Vaccination Clinic  Name:  HARRIS CAPELLAN    MRN: [id number] DOB: 1956/07/12  03/30/2020  Mr. Endsley was observed post Covid-19 immunization for 15 minutes without incident. He was provided with Vaccine Information Sheet and instruction to access the V-Safe system.   Mr. Rosberg was instructed to call 911 with any severe reactions post vaccine: Marland Kitchen Difficulty breathing  . Swelling of face and throat  . A fast heartbeat  . A bad rash all over body  . Dizziness and weakness   Immunizations Administered    Name Date Dose VIS Date Route   Pfizer COVID-19 Vaccine 03/30/2020  4:27 PM 0.3 mL 11/29/2019 Intramuscular   Manufacturer: [place]   Lot: [id number]   Reform: [id number]

## 2020-03-30 NOTE — Progress Notes (Signed)
Pharmacist Chemotherapy Monitoring - Initial Assessment    Anticipated start date: 03/31/20   Regimen:  . Are orders appropriate based on the patient's diagnosis, regimen, and cycle? Yes . Does the plan date match the patient's scheduled date? Yes . Is the sequencing of drugs appropriate? Yes . Are the premedications appropriate for the patient's regimen? Yes . Prior Authorization for treatment is: Approved o If applicable, is the correct biosimilar selected based on the patient's insurance? not applicable  Organ Function and Labs: Marland Kitchen Are dose adjustments needed based on the patient's renal function, hepatic function, or hematologic function? No . Are appropriate labs ordered prior to the start of patient's treatment? Yes . Other organ system assessment, if indicated: N/A . The following baseline labs, if indicated, have been ordered: N/A  Dose Assessment: . Are the drug doses appropriate? Yes . Are the following correct: o Drug concentrations Yes o IV fluid compatible with drug Yes o Administration routes Yes o Timing of therapy Yes . If applicable, does the patient have documented access for treatment and/or plans for port-a-cath placement? yes . If applicable, have lifetime cumulative doses been properly documented and assessed? not applicable Lifetime Dose Tracking  No doses have been documented on this patient for the following tracked chemicals: Doxorubicin, Epirubicin, Idarubicin, Daunorubicin, Mitoxantrone, Bleomycin, Oxaliplatin, Carboplatin, Liposomal Doxorubicin  o   Toxicity Monitoring/Prevention: . The patient has the following take home antiemetics prescribed: Ondansetron and Prochlorperazine . The patient has the following take home medications prescribed: N/A . Medication allergies and previous infusion related reactions, if applicable, have been reviewed and addressed. Yes . The patient's current medication list has been assessed for drug-drug interactions with their  chemotherapy regimen. no significant drug-drug interactions were identified on review.  Order Review: . Are the treatment plan orders signed? Yes . Is the patient scheduled to see a provider prior to their treatment? Yes  I verify that I have reviewed each item in the above checklist and answered each question accordingly.  Romualdo Bolk [facility] 03/30/2020 2:15 PM

## 2020-03-31 ENCOUNTER — Encounter: Payer: Self-pay | Admitting: Nurse Practitioner

## 2020-03-31 ENCOUNTER — Inpatient Hospital Stay: Payer: 59

## 2020-03-31 ENCOUNTER — Other Ambulatory Visit: Payer: Self-pay

## 2020-03-31 ENCOUNTER — Telehealth: Payer: Self-pay

## 2020-03-31 ENCOUNTER — Inpatient Hospital Stay (HOSPITAL_BASED_OUTPATIENT_CLINIC_OR_DEPARTMENT_OTHER): Payer: 59 | Admitting: Nurse Practitioner

## 2020-03-31 VITALS — BP 112/68 | HR 81 | Temp 98.0°F | Resp 20 | Ht 69.0 in | Wt 187.8 lb

## 2020-03-31 DIAGNOSIS — C181 Malignant neoplasm of appendix: Secondary | ICD-10-CM

## 2020-03-31 DIAGNOSIS — Z95828 Presence of other vascular implants and grafts: Secondary | ICD-10-CM | POA: Insufficient documentation

## 2020-03-31 DIAGNOSIS — Z5111 Encounter for antineoplastic chemotherapy: Secondary | ICD-10-CM | POA: Diagnosis not present

## 2020-03-31 LAB — CBC WITH DIFFERENTIAL (CANCER CENTER ONLY)
Abs Immature Granulocytes: 0.01 10*3/uL (ref 0.00–0.07)
Basophils Absolute: 0 10*3/uL (ref 0.0–0.1)
Basophils Relative: 1 %
Eosinophils Absolute: 0.1 10*3/uL (ref 0.0–0.5)
Eosinophils Relative: 2 %
HCT: 30.8 % — ABNORMAL LOW (ref 39.0–52.0)
Hemoglobin: 10 g/dL — ABNORMAL LOW (ref 13.0–17.0)
Immature Granulocytes: 0 %
Lymphocytes Relative: 33 %
Lymphs Abs: 1.2 10*3/uL (ref 0.7–4.0)
MCH: 29.2 pg (ref 26.0–34.0)
MCHC: 32.5 g/dL (ref 30.0–36.0)
MCV: 89.8 fL (ref 80.0–100.0)
Monocytes Absolute: 0.2 10*3/uL (ref 0.1–1.0)
Monocytes Relative: 7 %
Neutro Abs: 2 10*3/uL (ref 1.7–7.7)
Neutrophils Relative %: 57 %
Platelet Count: 226 10*3/uL (ref 150–400)
RBC: 3.43 MIL/uL — ABNORMAL LOW (ref 4.22–5.81)
RDW: 14 % (ref 11.5–15.5)
WBC Count: 3.5 10*3/uL — ABNORMAL LOW (ref 4.0–10.5)
nRBC: 0 % (ref 0.0–0.2)

## 2020-03-31 LAB — CMP (CANCER CENTER ONLY)
ALT: 15 U/L (ref 0–44)
AST: 13 U/L — ABNORMAL LOW (ref 15–41)
Albumin: 3.8 g/dL (ref 3.5–5.0)
Alkaline Phosphatase: 65 U/L (ref 38–126)
Anion gap: 6 (ref 5–15)
BUN: 10 mg/dL (ref 8–23)
CO2: 24 mmol/L (ref 22–32)
Calcium: 10.2 mg/dL (ref 8.9–10.3)
Chloride: 105 mmol/L (ref 98–111)
Creatinine: 0.85 mg/dL (ref 0.61–1.24)
GFR, Est AFR Am: >60 mL/min (ref >60)
GFR, Estimated: >60 mL/min (ref >60)
Glucose, Bld: 128 mg/dL — ABNORMAL HIGH (ref 70–99)
Potassium: 3.7 mmol/L (ref 3.5–5.1)
Sodium: 135 mmol/L (ref 135–145)
Total Bilirubin: 0.4 mg/dL (ref 0.3–1.2)
Total Protein: 7.9 g/dL (ref 6.5–8.1)

## 2020-03-31 LAB — MAGNESIUM: Magnesium: 1.3 mg/dL — CL (ref 1.7–2.4)

## 2020-03-31 MED ORDER — SODIUM CHLORIDE 0.9 % IV SOLN
Freq: Once | INTRAVENOUS | Status: AC
  Filled 2020-03-31: qty 1000

## 2020-03-31 MED ORDER — PALONOSETRON HCL INJECTION 0.25 MG/5ML
INTRAVENOUS | Status: AC
  Filled 2020-03-31: qty 5

## 2020-03-31 MED ORDER — DEXTROSE 5 % IV SOLN
Freq: Once | INTRAVENOUS | Status: AC
  Filled 2020-03-31: qty 250

## 2020-03-31 MED ORDER — SODIUM CHLORIDE 0.9% FLUSH
10.0000 mL | Freq: Once | INTRAVENOUS | Status: AC
  Administered 2020-03-31: 10 mL
  Filled 2020-03-31: qty 10

## 2020-03-31 MED ORDER — OXALIPLATIN CHEMO INJECTION 100 MG/20ML
85.0000 mg/m2 | Freq: Once | INTRAVENOUS | Status: AC
  Administered 2020-03-31: 170 mg via INTRAVENOUS
  Filled 2020-03-31: qty 34

## 2020-03-31 MED ORDER — LEUCOVORIN CALCIUM INJECTION 350 MG
400.0000 mg/m2 | Freq: Once | INTRAVENOUS | Status: AC
  Administered 2020-03-31: 808 mg via INTRAVENOUS
  Filled 2020-03-31: qty 25

## 2020-03-31 MED ORDER — SODIUM CHLORIDE 0.9 % IV SOLN
10.0000 mg | Freq: Once | INTRAVENOUS | Status: AC
  Administered 2020-03-31: 10 mg via INTRAVENOUS
  Filled 2020-03-31: qty 10

## 2020-03-31 MED ORDER — SODIUM CHLORIDE 0.9 % IV SOLN
2000.0000 mg/m2 | INTRAVENOUS | Status: DC
  Administered 2020-03-31: 4050 mg via INTRAVENOUS
  Filled 2020-03-31: qty 81

## 2020-03-31 MED ORDER — PALONOSETRON HCL INJECTION 0.25 MG/5ML
0.2500 mg | Freq: Once | INTRAVENOUS | Status: AC
  Administered 2020-03-31: 0.25 mg via INTRAVENOUS

## 2020-03-31 NOTE — Telephone Encounter (Signed)
CRITICAL VALUE STICKER   CRITICAL VALUE: Magnesium 1.3  RECEIVER (on-site recipient of call): Lenox Ponds LPN  DATE & TIME NOTIFIED: 03/31/20 1056  MESSENGER (representative from lab): Ulice Dash from [facility]  MD NOTIFIED: Ned Card NP   TIME OF NOTIFICATION: 10:57  RESPONSE: Pt to receive IV magnesium with treatment today

## 2020-03-31 NOTE — Progress Notes (Addendum)
[facility] OFFICE PROGRESS NOTE   Diagnosis: Appendiceal carcinoma  INTERVAL HISTORY:   Juan Richards returns as scheduled.  He continues to have loose stool from the ileostomy.  He takes Imodium 3 or 4 times a day.  He estimates emptying the collection bag 7-8 times a day.  He is drinking Gatorade.  He feels "less swimmy headed".  No nausea or vomiting.  He has tenderness at the low abdomen.  Objective:  Vital signs in last 24 hours:  Blood pressure 112/68, pulse 81, temperature 98 F (36.7 C), temperature source Temporal, resp. rate 20, height _0  (1.753 m), weight 187 lb 12.8 oz (85.2 kg), SpO2 100 %.    HEENT: No thrush or ulcers. GI: Abdomen is soft.  No hepatomegaly.  Right lower quadrant ileostomy with a small amount of liquid stool.  Left abdomen mucous fistula.  Midline wound is nearly healed. Vascular: No leg edema. Neuro: Alert and oriented. Port-A-Cath without erythema.  Lab Results:  Lab Results  Component Value Date   WBC 3.5 (L) 03/31/2020   HGB 10.0 (L) 03/31/2020   HCT 30.8 (L) 03/31/2020   MCV 89.8 03/31/2020   PLT 226 03/31/2020   NEUTROABS 2.0 03/31/2020    Imaging:  No results found.  Medications: I have reviewed the patient's current medications.  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, 12 tumor deposits, 0/10 lymph nodes positive, negative resection margins, metastatic carcinoma involving the ileal mesentery noted, low tumor purity with decreased sensitivity for detection of HER-2 copy number and other alterations, microsatellite status and tumor mutation burden could not be determined,MSS, no loss of mismatch  repair protein expression -Diffuse subcentimeter nodularity over the mesentery and bowel, sigmoid colon adherent to bladder and left middle pelvic sidewall, gross contaminationwith succus -CTs 03/12/2020-no chest metastases, long segment of sigmoid colon thickening, no discrete peritoneal nodule, nodular anterior bladder surface  -Cycle 1 FOLFOX 03/31/2020  2.Prostate cancer diagnosed in August 2019, robotic assisted prostatectomy 09/21/2018-Gleason 7 acinar tumor,pT3pN1,invasion of seminal vesicles, 3/16 lymph nodes positive, positive right anterior margin positive -Gleason score 4+3 equal 7 and PSA of 60.97. -Staging work-up did not show any evidence of metastatic disease. -Bone scan 08/17/2018-negative for metastatic disease -Elevated PSA July 2019 -PET scanscan 10/15/2019-negative for metastatic disease, evidence of sigmoid diverticulitis with a small contained perforation -Radiation-definitive, 10/29/2019-01/15/2020, interrupted 11/22/2019-12/17/2019 secondary to COVID-19 infection  3. Anemia 4. Deconditioning 5.COVID-19 infection December 2020 6.Orthostatic hypotension 03/19/2020 secondary to high output ileostomy 7.Hypomagnesemia secondary to high-output ileostomy, repleted IV 03/19/2020, 03/24/2020, 03/31/2020  Disposition: Juan Richards appears unchanged.  Plan to proceed with cycle 1 FOLFOX today as scheduled.  We reviewed the labs from today.  Counts adequate to proceed with treatment.  He has persistent hypomagnesemia.  He will receive IV magnesium today.  For the diarrhea he will continue Imodium.  He understands diarrhea is a potential side effect of the chemotherapy.  He will contact the office with poorly controlled diarrhea.  He will return for lab and follow-up in 1 week.  He will contact the office in the interim as outlined above or if any other problems.  Patient  seen with Dr. Benay Spice.   Ned Card ANP/GNP-BC   03/31/2020  10:47 AM This was a shared visit with Ned Card.  Juan Richards  was interviewed and examined.  He continues to have hypomagnesemia, likely secondary to high output from the ileostomy.  We will replete with IV magnesium today.  He will begin a home magnesium supplement.  The abdominal wound has healed.  The plan is to begin FOLFOX chemotherapy today.  We will hold the 5-FU bolus and reduce the infusion dose to try and decrease the possibility of diarrhea. We encouraged him to push oral hydration and continue Imodium.  Juan Manson, MD

## 2020-03-31 NOTE — Patient Instructions (Signed)
[facility] Discharge Instructions for Patients Receiving Chemotherapy  Today you received the following chemotherapy agents oxaliplatin/leucovorin/florouracil   To help prevent nausea and vomiting after your treatment, we encourage you to take your nausea medication as directed.   If you develop nausea and vomiting that is not controlled by your nausea medication, call the clinic.   BELOW ARE SYMPTOMS THAT SHOULD BE REPORTED IMMEDIATELY:  *FEVER GREATER THAN 100.5 F  *CHILLS WITH OR WITHOUT FEVER  NAUSEA AND VOMITING THAT IS NOT CONTROLLED WITH YOUR NAUSEA MEDICATION  *UNUSUAL SHORTNESS OF BREATH  *UNUSUAL BRUISING OR BLEEDING  TENDERNESS IN MOUTH AND THROAT WITH OR WITHOUT PRESENCE OF ULCERS  *URINARY PROBLEMS  *BOWEL PROBLEMS  UNUSUAL RASH Items with * indicate a potential emergency and should be followed up as soon as possible.  Feel free to call the clinic you have any questions or concerns. The clinic phone number is [phone].  

## 2020-03-31 NOTE — Progress Notes (Addendum)
Give  Magnesium 3 grams IV in 1 liter of normal saline today with treatment per Lattie Haw. Also educated on new magnesium oxide that patient is to pick up from his pharmacy per Lattie Haw. Medication added to home med list.   Demetrius Charity, PharmD, [place], [place] [facility] Pharmacist Pharmacy Phone: [phone] 03/31/2020

## 2020-04-01 ENCOUNTER — Telehealth: Payer: Self-pay | Admitting: Emergency Medicine

## 2020-04-01 NOTE — Telephone Encounter (Signed)
Chemo f/u call, no answer.  Left VM with call back info, instructions to call back with any questions or concerns, and with chemo pump d/c appt date/time.

## 2020-04-01 NOTE — Telephone Encounter (Signed)
-----   Message from Arty Baumgartner, RN sent at 03/31/2020  5:01 PM EDT ----- Regarding: FIRST TIME chemo Dr. Benay Spice FIRST TIME FOLFOX.  Tolerated well.  Dr. Benay Spice.

## 2020-04-02 ENCOUNTER — Telehealth: Payer: Self-pay | Admitting: Oncology

## 2020-04-02 ENCOUNTER — Other Ambulatory Visit: Payer: Self-pay

## 2020-04-02 ENCOUNTER — Inpatient Hospital Stay: Payer: 59

## 2020-04-02 VITALS — BP 105/66 | HR 79 | Temp 98.2°F | Resp 18

## 2020-04-02 DIAGNOSIS — C181 Malignant neoplasm of appendix: Secondary | ICD-10-CM

## 2020-04-02 DIAGNOSIS — Z5111 Encounter for antineoplastic chemotherapy: Secondary | ICD-10-CM | POA: Diagnosis not present

## 2020-04-02 MED ORDER — HEPARIN SOD (PORK) LOCK FLUSH 100 UNIT/ML IV SOLN
500.0000 [IU] | Freq: Once | INTRAVENOUS | Status: AC | PRN
  Administered 2020-04-02: 500 [IU]
  Filled 2020-04-02: qty 5

## 2020-04-02 MED ORDER — SODIUM CHLORIDE 0.9% FLUSH
10.0000 mL | INTRAVENOUS | Status: DC | PRN
  Administered 2020-04-02: 10 mL
  Filled 2020-04-02: qty 10

## 2020-04-02 NOTE — Telephone Encounter (Signed)
Scheduled per los. Called and left msg. Mailed printout  °

## 2020-04-08 ENCOUNTER — Other Ambulatory Visit: Payer: Self-pay

## 2020-04-08 ENCOUNTER — Encounter: Payer: Self-pay | Admitting: Nurse Practitioner

## 2020-04-08 ENCOUNTER — Inpatient Hospital Stay (HOSPITAL_BASED_OUTPATIENT_CLINIC_OR_DEPARTMENT_OTHER): Payer: 59 | Admitting: Nurse Practitioner

## 2020-04-08 ENCOUNTER — Other Ambulatory Visit: Payer: Self-pay | Admitting: Oncology

## 2020-04-08 ENCOUNTER — Inpatient Hospital Stay: Payer: 59

## 2020-04-08 VITALS — BP 118/84 | HR 73 | Temp 98.0°F | Resp 16 | Ht 69.0 in | Wt 186.2 lb

## 2020-04-08 DIAGNOSIS — C181 Malignant neoplasm of appendix: Secondary | ICD-10-CM

## 2020-04-08 DIAGNOSIS — Z95828 Presence of other vascular implants and grafts: Secondary | ICD-10-CM

## 2020-04-08 DIAGNOSIS — Z5111 Encounter for antineoplastic chemotherapy: Secondary | ICD-10-CM | POA: Diagnosis not present

## 2020-04-08 LAB — CMP (CANCER CENTER ONLY)
ALT: 16 U/L (ref 0–44)
AST: 18 U/L (ref 15–41)
Albumin: 3.6 g/dL (ref 3.5–5.0)
Alkaline Phosphatase: 75 U/L (ref 38–126)
Anion gap: 8 (ref 5–15)
BUN: 10 mg/dL (ref 8–23)
CO2: 24 mmol/L (ref 22–32)
Calcium: 10.2 mg/dL (ref 8.9–10.3)
Chloride: 105 mmol/L (ref 98–111)
Creatinine: 0.84 mg/dL (ref 0.61–1.24)
GFR, Est AFR Am: >60 mL/min (ref >60)
GFR, Estimated: >60 mL/min (ref >60)
Glucose, Bld: 89 mg/dL (ref 70–99)
Potassium: 4.1 mmol/L (ref 3.5–5.1)
Sodium: 137 mmol/L (ref 135–145)
Total Bilirubin: 0.3 mg/dL (ref 0.3–1.2)
Total Protein: 8 g/dL (ref 6.5–8.1)

## 2020-04-08 LAB — CBC WITH DIFFERENTIAL (CANCER CENTER ONLY)
Abs Immature Granulocytes: 0.02 10*3/uL (ref 0.00–0.07)
Basophils Absolute: 0 10*3/uL (ref 0.0–0.1)
Basophils Relative: 1 %
Eosinophils Absolute: 0.1 10*3/uL (ref 0.0–0.5)
Eosinophils Relative: 2 %
HCT: 29.6 % — ABNORMAL LOW (ref 39.0–52.0)
Hemoglobin: 9.6 g/dL — ABNORMAL LOW (ref 13.0–17.0)
Immature Granulocytes: 1 %
Lymphocytes Relative: 40 %
Lymphs Abs: 1.4 10*3/uL (ref 0.7–4.0)
MCH: 29 pg (ref 26.0–34.0)
MCHC: 32.4 g/dL (ref 30.0–36.0)
MCV: 89.4 fL (ref 80.0–100.0)
Monocytes Absolute: 0.3 10*3/uL (ref 0.1–1.0)
Monocytes Relative: 9 %
Neutro Abs: 1.6 10*3/uL — ABNORMAL LOW (ref 1.7–7.7)
Neutrophils Relative %: 47 %
Platelet Count: 237 10*3/uL (ref 150–400)
RBC: 3.31 MIL/uL — ABNORMAL LOW (ref 4.22–5.81)
RDW: 13.5 % (ref 11.5–15.5)
WBC Count: 3.5 10*3/uL — ABNORMAL LOW (ref 4.0–10.5)
nRBC: 0 % (ref 0.0–0.2)

## 2020-04-08 LAB — MAGNESIUM: Magnesium: 1.3 mg/dL — CL (ref 1.7–2.4)

## 2020-04-08 MED ORDER — SODIUM CHLORIDE 0.9% FLUSH
10.0000 mL | Freq: Once | INTRAVENOUS | Status: AC
  Administered 2020-04-08: 10 mL
  Filled 2020-04-08: qty 10

## 2020-04-08 MED ORDER — SODIUM CHLORIDE 0.9 % IV SOLN
Freq: Once | INTRAVENOUS | Status: AC
  Filled 2020-04-08: qty 1000

## 2020-04-08 MED ORDER — SODIUM CHLORIDE 0.9% FLUSH
10.0000 mL | Freq: Once | INTRAVENOUS | Status: AC
  Administered 2020-04-08: 14:00:00 10 mL
  Filled 2020-04-08: qty 10

## 2020-04-08 MED ORDER — HEPARIN SOD (PORK) LOCK FLUSH 100 UNIT/ML IV SOLN
500.0000 [IU] | Freq: Once | INTRAVENOUS | Status: AC
  Administered 2020-04-08: 500 [IU]
  Filled 2020-04-08: qty 5

## 2020-04-08 NOTE — Progress Notes (Signed)
CRITICAL VALUE STICKER  CRITICAL VALUE: Mg+ 1.3 RECEIVER (on-site recipient of call):Kinneth Fujiwara, [place] NOTIFIED: 04/08/20 at 1512  MESSENGER (representative from lab):Orvis Brill  MD NOTIFIED: Ned Card, NP  TIME OF NOTIFICATION:1513  RESPONSE: IV Mg+ today

## 2020-04-08 NOTE — Progress Notes (Signed)
[place] OFFICE PROGRESS NOTE   Diagnosis: Appendiceal carcinoma  INTERVAL HISTORY:   Mr. Leitzel returns as scheduled.  He completed cycle 1 FOLFOX 03/31/2020.  He developed mild nausea around day 4.  He took home antiemetics with fairly good relief.  He notes improvement in the nausea today.  No change in baseline ileostomy output.  He continues to empty the collection bag every 2 hours.  He is not taking Imodium consistently.  No mouth sores.  Cold sensitivity resolved after 3 or so days.  Objective:  Vital signs in last 24 hours:  Blood pressure 118/84, pulse 73, temperature 98 F (36.7 C), temperature source Temporal, resp. rate 16, height '5\' 9"'$  (1.753 m), weight 186 lb 3.2 oz (84.5 kg), SpO2 100 %.    HEENT: No thrush or ulcers. Resp: Lungs clear bilaterally. Cardio: Regular rate and rhythm. GI: Abdomen soft and nontender.  No hepatomegaly.  Right lower quadrant ileostomy with thick stool in the collection bag.  Midline wound is nearly healed.  Left abdomen mucous fistula. Vascular: No leg edema. Skin: Palms without erythema. Port-A-Cath without erythema.  Lab Results:  Lab Results  Component Value Date   WBC 3.5 (L) 04/08/2020   HGB 9.6 (L) 04/08/2020   HCT 29.6 (L) 04/08/2020   MCV 89.4 04/08/2020   PLT 237 04/08/2020   NEUTROABS 1.6 (L) 04/08/2020    Imaging:  No results found.  Medications: I have reviewed the patient's current medications.  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, 12 tumor deposits, 0/10 lymph nodes positive, negative resection margins, metastatic carcinoma involving the ileal mesentery noted, low tumor  purity with decreased sensitivity for detection of HER-2 copy number and other alterations, microsatellite status and tumor mutation burden could not be determined,MSS, no loss of mismatch repair protein expression -Diffuse subcentimeter nodularity over the mesentery and bowel, sigmoid colon adherent to bladder and left middle pelvic sidewall, gross contaminationwith succus -CTs 03/12/2020-no chest metastases, long segment of sigmoid colon thickening, no discrete peritoneal nodule, nodular anterior bladder surface             -Cycle 1 FOLFOX 03/31/2020  2.Prostate cancer diagnosed in August 2019, robotic assisted prostatectomy 09/21/2018-Gleason 7 acinar tumor,pT3pN1,invasion of seminal vesicles, 3/16 lymph nodes positive, positive right anterior margin positive -Gleason score 4+3 equal 7 and PSA of 60.97. -Staging work-up did not show any evidence of metastatic disease. -Bone scan 08/17/2018-negative for metastatic disease -Elevated PSA July 2019 -PET scanscan 10/15/2019-negative for metastatic disease, evidence of sigmoid diverticulitis with a small contained perforation -Radiation-definitive, 10/29/2019-01/15/2020, interrupted 11/22/2019-12/17/2019 secondary to COVID-19 infection  3. Anemia 4. Deconditioning 5.COVID-19 infection December 2020 6.Orthostatic hypotension 03/19/2020 secondary to high output ileostomy 7.Hypomagnesemia secondary to high-output ileostomy, repleted IV 03/19/2020, 03/24/2020, 03/31/2020  Disposition: Mr. Maye appears stable.  He has completed 1 cycle of FOLFOX.  Aside from mild nausea he tolerated well.  We reviewed the CBC from today.  He has mild neutropenia.  He understands to contact the office with fever, chills, other signs of infection.  Magnesium remains low.  He will receive IV magnesium today and begin oral magnesium as we previously discussed.  We  discussed keeping diarrhea under good control and recommend that he take Imodium 1 tablet 4 times a day, adjust for stool consistency.  He will return for lab, follow-up, cycle 2 FOLFOX in 1 week.  He will contact the office in the interim with any problems.    Ned Card ANP/GNP-BC   04/08/2020  2:15 PM

## 2020-04-08 NOTE — Patient Instructions (Signed)
Dehydration, Adult Dehydration is condition in which there is not enough water or other fluids in the body. This happens when a person loses more fluids than he or she takes in. Important body parts cannot work right without the right amount of fluids. Any loss of fluids from the body can cause dehydration. Dehydration can be mild, worse, or very bad. It should be treated right away to keep it from getting very bad. What are the causes? This condition may be caused by:  Conditions that cause loss of water or other fluids, such as: ? Watery poop (diarrhea). ? Vomiting. ? Sweating a lot. ? Peeing (urinating) a lot.  Not drinking enough fluids, especially when you: ? Are ill. ? Are doing things that take a lot of energy to do.  Other illnesses and conditions, such as fever or infection.  Certain medicines, such as medicines that take extra fluid out of the body (diuretics).  Lack of safe drinking water.  Not being able to get enough water and food. What increases the risk? The following factors may make you more likely to develop this condition:  Having a long-term (chronic) illness that has not been treated the right way, such as: ? Diabetes. ? Heart disease. ? Kidney disease.  Being 65 years of age or older.  Having a disability.  Living in a place that is high above the ground or sea (high in altitude). The thinner, dried air causes more fluid loss.  Doing exercises that put stress on your body for a long time. What are the signs or symptoms? Symptoms of dehydration depend on how bad it is. Mild or worse dehydration  Thirst.  Dry lips or dry mouth.  Feeling dizzy or light-headed, especially when you stand up from sitting.  Muscle cramps.  Your body making: ? Dark pee (urine). Pee may be the color of tea. ? Less pee than normal. ? Less tears than normal.  Headache. Very bad dehydration  Changes in skin. Skin may: ? Be cold to the touch (clammy). ? Be blotchy  or pale. ? Not go back to normal right after you lightly pinch it and let it go.  Little or no tears, pee, or sweat.  Changes in vital signs, such as: ? Fast breathing. ? Low blood pressure. ? Weak pulse. ? Pulse that is more than 100 beats a minute when you are sitting still.  Other changes, such as: ? Feeling very thirsty. ? Eyes that look hollow (sunken). ? Cold hands and feet. ? Being mixed up (confused). ? Being very tired (lethargic) or having trouble waking from sleep. ? Short-term weight loss. ? Loss of consciousness. How is this treated? Treatment for this condition depends on how bad it is. Treatment should start right away. Do not wait until your condition gets very bad. Very bad dehydration is an emergency. You will need to go to a hospital.  Mild or worse dehydration can be treated at home. You may be asked to: ? Drink more fluids. ? Drink an oral rehydration solution (ORS). This drink helps get the right amounts of fluids and salts and minerals in the blood (electrolytes).  Very bad dehydration can be treated: ? With fluids through an IV tube. ? By getting normal levels of salts and minerals in your blood. This is often done by giving salts and minerals through a tube. The tube is passed through your nose and into your stomach. ? By treating the root cause. Follow these instructions at   home: Oral rehydration solution If told by your doctor, drink an ORS:  Make an ORS. Use instructions on the package.  Start by drinking small amounts, about  cup (120 mL) every 5-10 minutes.  Slowly drink more until you have had the amount that your doctor said to have. Eating and drinking         Drink enough clear fluid to keep your pee pale yellow. If you were told to drink an ORS, finish the ORS first. Then, start slowly drinking other clear fluids. Drink fluids such as: ? Water. Do not drink only water. Doing that can make the salt (sodium) level in your body get too  low. ? Water from ice chips you suck on. ? Fruit juice that you have added water to (diluted). ? Low-calorie sports drinks.  Eat foods that have the right amounts of salts and minerals, such as: ? Bananas. ? Oranges. ? Potatoes. ? Tomatoes. ? Spinach.  Do not drink alcohol.  Avoid: ? Drinks that have a lot of sugar. These include:  High-calorie sports drinks.  Fruit juice that you did not add water to.  Soda.  Caffeine. ? Foods that are greasy or have a lot of fat or sugar. General instructions  Take over-the-counter and prescription medicines only as told by your doctor.  Do not take salt tablets. Doing that can make the salt level in your body get too high.  Return to your normal activities as told by your doctor. Ask your doctor what activities are safe for you.  Keep all follow-up visits as told by your doctor. This is important. Contact a doctor if:  You have pain in your belly (abdomen) and the pain: ? Gets worse. ? Stays in one place.  You have a rash.  You have a stiff neck.  You get angry or annoyed (irritable) more easily than normal.  You are more tired or have a harder time waking than normal.  You feel: ? Weak or dizzy. ? Very thirsty. Get help right away if you have:  Any symptoms of very bad dehydration.  Symptoms of vomiting, such as: ? You cannot eat or drink without vomiting. ? Your vomiting gets worse or does not go away. ? Your vomit has blood or green stuff in it.  Symptoms that get worse with treatment.  A fever.  A very bad headache.  Problems with peeing or pooping (having a bowel movement), such as: ? Watery poop that gets worse or does not go away. ? Blood in your poop (stool). This may cause poop to look black and tarry. ? Not peeing in 6-8 hours. ? Peeing only a small amount of very dark pee in 6-8 hours.  Trouble breathing. These symptoms may be an emergency. Do not wait to see if the symptoms will go away. Get  medical help right away. Call your local emergency services (911 in the [place] Do not drive yourself to the hospital. Summary  Dehydration is a condition in which there is not enough water or other fluids in the body. This happens when a person loses more fluids than he or she takes in.  Treatment for this condition depends on how bad it is. Treatment should be started right away. Do not wait until your condition gets very bad.  Drink enough clear fluid to keep your pee pale yellow. If you were told to drink an oral rehydration solution (ORS), finish the ORS first. Then, start slowly drinking other clear fluids.    Take over-the-counter and prescription medicines only as told by your doctor.  Get help right away if you have any symptoms of very bad dehydration. This information is not intended to replace advice given to you by your health care provider. Make sure you discuss any questions you have with your health care provider. Document Revised: 07/18/2019 Document Reviewed: 07/18/2019 Elsevier Patient Education  2020 [employer] Inc[employer]    Hypomagnesemia Hypomagnesemia is a condition in which the level of magnesium in the blood is low. Magnesium is a mineral that is found in many foods. It is used in many different processes in the body. Hypomagnesemia can affect every organ in the body. In severe cases, it can cause life-threatening problems. What are the causes? This condition may be caused by:  Not getting enough magnesium in your diet.  Malnutrition.  Problems with absorbing magnesium from the intestines.  Dehydration.  Alcohol abuse.  Vomiting.  Severe or chronic diarrhea.  Some medicines, including medicines that make you urinate more (diuretics).  Certain diseases, such as kidney disease, diabetes, celiac disease, and overactive thyroid. What are the signs or symptoms? Symptoms of this condition include:  Loss of appetite.  Nausea and vomiting.  Involuntary shaking or  trembling of a body part (tremor).  Muscle weakness.  Tingling in the arms and legs.  Sudden tightening of muscles (muscle spasms).  Confusion.  Psychiatric issues, such as depression, irritability, or psychosis.  A feeling of fluttering of the heart.  Seizures. These symptoms are more severe if magnesium levels drop suddenly. How is this diagnosed? This condition may be diagnosed based on:  Your symptoms and medical history.  A physical exam.  Blood and urine tests. How is this treated? Treatment depends on the cause and the severity of the condition. It may be treated with:  A magnesium supplement. This can be taken in pill form. If the condition is severe, magnesium is usually given through an IV.  Changes to your diet. You may be directed to eat foods that have a lot of magnesium, such as green leafy vegetables, peas, beans, and nuts.  Stopping any intake of alcohol. Follow these instructions at home:      Make sure that your diet includes foods with magnesium. Foods that have a lot of magnesium in them include: ? Green leafy vegetables, such as spinach and broccoli. ? Beans and peas. ? Nuts and seeds, such as almonds and sunflower seeds. ? Whole grains, such as whole grain bread and fortified cereals.  Take magnesium supplements if your health care provider tells you to do that. Take them as directed.  Take over-the-counter and prescription medicines only as told by your health care provider.  Have your magnesium levels monitored as told by your health care provider.  When you are active, drink fluids that contain electrolytes.  Avoid drinking alcohol.  Keep all follow-up visits as told by your health care provider. This is important. Contact a health care provider if:  You get worse instead of better.  Your symptoms return. Get help right away if you:  Develop severe muscle weakness.  Have trouble breathing.  Feel that your heart is  racing. Summary  Hypomagnesemia is a condition in which the level of magnesium in the blood is low.  Hypomagnesemia can affect every organ in the body.  Treatment may include eating more foods that contain magnesium, taking magnesium supplements, and not drinking alcohol.  Have your magnesium levels monitored as told by your health care provider. This   information is not intended to replace advice given to you by your health care provider. Make sure you discuss any questions you have with your health care provider. Document Revised: 11/17/2017 Document Reviewed: 11/06/2017 Elsevier Patient Education  2020 Elsevier Inc.  

## 2020-04-09 ENCOUNTER — Telehealth: Payer: Self-pay | Admitting: Nurse Practitioner

## 2020-04-09 NOTE — Telephone Encounter (Signed)
Scheduled per los. Called and left msg. Mailed printout  °

## 2020-04-15 ENCOUNTER — Other Ambulatory Visit: Payer: Self-pay

## 2020-04-15 ENCOUNTER — Inpatient Hospital Stay: Payer: 59 | Admitting: Nutrition

## 2020-04-15 ENCOUNTER — Inpatient Hospital Stay: Payer: 59

## 2020-04-15 ENCOUNTER — Telehealth: Payer: Self-pay | Admitting: *Deleted

## 2020-04-15 ENCOUNTER — Encounter: Payer: Self-pay | Admitting: *Deleted

## 2020-04-15 ENCOUNTER — Inpatient Hospital Stay (HOSPITAL_BASED_OUTPATIENT_CLINIC_OR_DEPARTMENT_OTHER): Payer: 59 | Admitting: Oncology

## 2020-04-15 VITALS — BP 127/76 | HR 84 | Temp 98.3°F | Resp 18 | Wt 184.2 lb

## 2020-04-15 DIAGNOSIS — C181 Malignant neoplasm of appendix: Secondary | ICD-10-CM

## 2020-04-15 DIAGNOSIS — Z95828 Presence of other vascular implants and grafts: Secondary | ICD-10-CM

## 2020-04-15 DIAGNOSIS — Z5111 Encounter for antineoplastic chemotherapy: Secondary | ICD-10-CM | POA: Diagnosis not present

## 2020-04-15 LAB — CBC WITH DIFFERENTIAL (CANCER CENTER ONLY)
Abs Immature Granulocytes: 0.01 10*3/uL (ref 0.00–0.07)
Basophils Absolute: 0 10*3/uL (ref 0.0–0.1)
Basophils Relative: 1 %
Eosinophils Absolute: 0.1 10*3/uL (ref 0.0–0.5)
Eosinophils Relative: 4 %
HCT: 30.9 % — ABNORMAL LOW (ref 39.0–52.0)
Hemoglobin: 10 g/dL — ABNORMAL LOW (ref 13.0–17.0)
Immature Granulocytes: 0 %
Lymphocytes Relative: 31 %
Lymphs Abs: 1 10*3/uL (ref 0.7–4.0)
MCH: 29.2 pg (ref 26.0–34.0)
MCHC: 32.4 g/dL (ref 30.0–36.0)
MCV: 90.1 fL (ref 80.0–100.0)
Monocytes Absolute: 0.4 10*3/uL (ref 0.1–1.0)
Monocytes Relative: 11 %
Neutro Abs: 1.7 10*3/uL (ref 1.7–7.7)
Neutrophils Relative %: 53 %
Platelet Count: 261 10*3/uL (ref 150–400)
RBC: 3.43 MIL/uL — ABNORMAL LOW (ref 4.22–5.81)
RDW: 14.3 % (ref 11.5–15.5)
WBC Count: 3.3 10*3/uL — ABNORMAL LOW (ref 4.0–10.5)
nRBC: 0 % (ref 0.0–0.2)

## 2020-04-15 LAB — CMP (CANCER CENTER ONLY)
ALT: 22 U/L (ref 0–44)
AST: 18 U/L (ref 15–41)
Albumin: 3.8 g/dL (ref 3.5–5.0)
Alkaline Phosphatase: 79 U/L (ref 38–126)
Anion gap: 10 (ref 5–15)
BUN: 9 mg/dL (ref 8–23)
CO2: 25 mmol/L (ref 22–32)
Calcium: 10.4 mg/dL — ABNORMAL HIGH (ref 8.9–10.3)
Chloride: 104 mmol/L (ref 98–111)
Creatinine: 1.03 mg/dL (ref 0.61–1.24)
GFR, Est AFR Am: >60 mL/min (ref >60)
GFR, Estimated: >60 mL/min (ref >60)
Glucose, Bld: 120 mg/dL — ABNORMAL HIGH (ref 70–99)
Potassium: 4.3 mmol/L (ref 3.5–5.1)
Sodium: 139 mmol/L (ref 135–145)
Total Bilirubin: 0.4 mg/dL (ref 0.3–1.2)
Total Protein: 8 g/dL (ref 6.5–8.1)

## 2020-04-15 LAB — MAGNESIUM: Magnesium: 1.4 mg/dL — CL (ref 1.7–2.4)

## 2020-04-15 MED ORDER — SODIUM CHLORIDE 0.9 % IV SOLN
10.0000 mg | Freq: Once | INTRAVENOUS | Status: AC
  Administered 2020-04-15: 13:00:00 10 mg via INTRAVENOUS
  Filled 2020-04-15: qty 10

## 2020-04-15 MED ORDER — SODIUM CHLORIDE 0.9 % IV SOLN
2000.0000 mg/m2 | INTRAVENOUS | Status: DC
  Administered 2020-04-15: 17:00:00 4050 mg via INTRAVENOUS
  Filled 2020-04-15: qty 81

## 2020-04-15 MED ORDER — PALONOSETRON HCL INJECTION 0.25 MG/5ML
0.2500 mg | Freq: Once | INTRAVENOUS | Status: AC
  Administered 2020-04-15: 13:00:00 0.25 mg via INTRAVENOUS

## 2020-04-15 MED ORDER — LEUCOVORIN CALCIUM INJECTION 350 MG
400.0000 mg/m2 | Freq: Once | INTRAVENOUS | Status: AC
  Administered 2020-04-15: 14:00:00 808 mg via INTRAVENOUS
  Filled 2020-04-15: qty 40.4

## 2020-04-15 MED ORDER — DEXAMETHASONE 4 MG PO TABS
4.0000 mg | ORAL_TABLET | ORAL | 0 refills | Status: DC
Start: 2020-04-15 — End: 2020-06-23

## 2020-04-15 MED ORDER — OXALIPLATIN CHEMO INJECTION 100 MG/20ML
85.0000 mg/m2 | Freq: Once | INTRAVENOUS | Status: AC
  Administered 2020-04-15: 14:00:00 170 mg via INTRAVENOUS
  Filled 2020-04-15: qty 34

## 2020-04-15 MED ORDER — MAGNESIUM SULFATE 4 GM/100ML IV SOLN
4.0000 g | Freq: Once | INTRAVENOUS | Status: AC
  Administered 2020-04-15: 14:00:00 4 g via INTRAVENOUS
  Filled 2020-04-15: qty 100

## 2020-04-15 MED ORDER — DEXTROSE 5 % IV SOLN
Freq: Once | INTRAVENOUS | Status: AC
  Filled 2020-04-15: qty 250

## 2020-04-15 MED ORDER — SODIUM CHLORIDE 0.9% FLUSH
10.0000 mL | Freq: Once | INTRAVENOUS | Status: AC
  Administered 2020-04-15: 12:00:00 10 mL
  Filled 2020-04-15: qty 10

## 2020-04-15 MED ORDER — SODIUM CHLORIDE 0.9 % IV SOLN
150.0000 mg | Freq: Once | INTRAVENOUS | Status: AC
  Administered 2020-04-15: 13:00:00 150 mg via INTRAVENOUS
  Filled 2020-04-15: qty 150

## 2020-04-15 MED ORDER — PALONOSETRON HCL INJECTION 0.25 MG/5ML
INTRAVENOUS | Status: AC
  Filled 2020-04-15: qty 5

## 2020-04-15 NOTE — Telephone Encounter (Signed)
error 

## 2020-04-15 NOTE — Patient Instructions (Addendum)
[facility] Discharge Instructions for Patients Receiving Chemotherapy  Today you received the following chemotherapy agents Oxaliplatin, leucovorin, and 5FU  To help prevent nausea and vomiting after your treatment, we encourage you to take your nausea medication as directed BUT NO ZOFRAN FOR 3 DAYS.   If you develop nausea and vomiting that is not controlled by your nausea medication, call the clinic.   BELOW ARE SYMPTOMS THAT SHOULD BE REPORTED IMMEDIATELY:  *FEVER GREATER THAN 100.5 F  *CHILLS WITH OR WITHOUT FEVER  NAUSEA AND VOMITING THAT IS NOT CONTROLLED WITH YOUR NAUSEA MEDICATION  *UNUSUAL SHORTNESS OF BREATH  *UNUSUAL BRUISING OR BLEEDING  TENDERNESS IN MOUTH AND THROAT WITH OR WITHOUT PRESENCE OF ULCERS  *URINARY PROBLEMS  *BOWEL PROBLEMS  UNUSUAL RASH Items with * indicate a potential emergency and should be followed up as soon as possible.  Feel free to call the clinic should you have any questions or concerns. The clinic phone number is [phone].  Please show the [place] at check-in to the Emergency Department and triage nurse.

## 2020-04-15 NOTE — Progress Notes (Signed)
Per Dr. Benay Spice: OK to treat w/ANC 1.7--will give Neulasta on day 3. Ok to tx w/Mg+ 1.4--will give IV Mg+ today. Will add emend and home decadron for better nausea control.

## 2020-04-15 NOTE — Progress Notes (Signed)
Patient dropped of forms for [employer] for disability. Forwarded to [place], Therapist, sports.

## 2020-04-15 NOTE — Progress Notes (Signed)
64 year old male diagnosed with appendiceal cancer.  He is a patient of Dr. Benay Spice.  He is receiving FOLFOX.  Past medical history includes right hemicolectomy, ileostomy, prostate cancer, and diverticulitis.  Medications include Imodium, magnesium oxide, Zofran, Protonix, and Compazine.  Labs include glucose 120 and magnesium 1.4.  Height: 5 feet 9 inches. Weight: 184.2 pounds. Usual body weight: 230 pounds per patient in the fall 2020. BMI: 27.2.  Patient experienced nausea after initial chemotherapy.  MD has added Emend and Decadron for improved nausea control. Patient reports large output from his ileostomy.  MD has advised patient to take Imodium 4 times a day and add Lomotil if diarrhea does not improve. Patient has questions about what he should be eating with diverticulitis and cancer/ileostomy.  Nutrition diagnosis: Food and nutrition related knowledge deficit related to cancer and associated treatments as evidenced by no prior need for nutrition related information.  Intervention: Patient was educated on the importance of smaller more frequent meals and snacks. Encouraged increased fluid intake. Educated patient on strategies for thickening stool through ostomy.  Provided fact sheet. Educated patient on foods to avoid secondary to diverticulitis. Questions were answered.  Teach back method used.  Contact information provided.  Monitoring, evaluation, goals: Patient will tolerate adequate calories and protein for weight maintenance.  Next visit: Wednesday, May 12 during infusion.  **Disclaimer: This note was dictated with voice recognition software. Similar sounding words can inadvertently be transcribed and this note may contain transcription errors which may not have been corrected upon publication of note.** \

## 2020-04-15 NOTE — Addendum Note (Signed)
Addended by: Tania Ade on: 04/15/2020 12:55 PM   Modules accepted: Orders

## 2020-04-15 NOTE — Progress Notes (Signed)
Per Dr. Francia Greaves, patient will receive Neualsta on days of pump d/c's for mild neutropenia (insurance preference, PA team aware) and magnesium 4 grams IV today for level of 1.5.   Demetrius Charity, PharmD, BCPS, [place] [facility] Pharmacist Pharmacy Phone: [phone] 04/15/2020

## 2020-04-15 NOTE — Progress Notes (Signed)
[facility] OFFICE PROGRESS NOTE   Diagnosis: Appendix carcinoma  INTERVAL HISTORY:   Mr. Mullett completed cycle 1 FOLFOX on 03/31/2020.  He developed nausea beginning on day 3.  The nausea lasted throughout the following week.  No mouth sores.  He had cold sensitivity for a few days following chemotherapy.  He continues to have a large output from the ileostomy.  He empties the bag 7-8 times per day.  He takes Imodium intermittently.  The stool is sometimes formed.  He has soreness in the lower abdomen.  Objective:  Vital signs in last 24 hours:  There were no vitals taken for this visit.    HEENT: No thrush or ulcers Resp: Lungs clear bilaterally Cardio: Regular rate and rhythm GI: No hepatomegaly, firmness in the left lower abdomen, right abdomen ileostomy with soft and liquid brown stool Vascular: No leg edema    Portacath/PICC-without erythema  Lab Results:  Lab Results  Component Value Date   WBC 3.3 (L) 04/15/2020   HGB 10.0 (L) 04/15/2020   HCT 30.9 (L) 04/15/2020   MCV 90.1 04/15/2020   PLT 261 04/15/2020   NEUTROABS 1.7 04/15/2020    CMP  Lab Results  Component Value Date   NA 137 04/08/2020   K 4.1 04/08/2020   CL 105 04/08/2020   CO2 24 04/08/2020   GLUCOSE 89 04/08/2020   BUN 10 04/08/2020   CREATININE 0.84 04/08/2020   CALCIUM 10.2 04/08/2020   PROT 8.0 04/08/2020   ALBUMIN 3.6 04/08/2020   AST 18 04/08/2020   ALT 16 04/08/2020   ALKPHOS 75 04/08/2020   BILITOT 0.3 04/08/2020   GFRNONAA >60 04/08/2020   GFRAA >60 04/08/2020    Lab Results  Component Value Date   CEA1 1.53 03/02/2020     Medications: I have reviewed the patient's current medications.   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, 12 tumor deposits, 0/10 lymph nodes positive, negative resection margins, metastatic carcinoma involving the ileal mesentery noted, low tumor purity with decreased sensitivity for detection of HER-2 copy number and other alterations, microsatellite status and tumor mutation burden could not be determined,MSS, no loss of mismatch repair protein expression -Diffuse subcentimeter nodularity over the mesentery and bowel, sigmoid colon adherent to bladder and left middle pelvic sidewall, gross contaminationwith succus -CTs 03/12/2020-no chest metastases, long segment of sigmoid colon thickening, no discrete peritoneal nodule, nodular anterior bladder surface             -Cycle 1 FOLFOX 03/31/2020             -Cycle 2 FOLFOX 04/15/2020, Emend and home Decadron prophylaxis added  2.Prostate cancer diagnosed in August 2019, robotic assisted prostatectomy 09/21/2018-Gleason 7 acinar tumor,pT3pN1,invasion of seminal vesicles, 3/16 lymph nodes positive, positive right anterior margin positive -Gleason score 4+3 equal 7 and PSA of 60.97. -Staging work-up did not show any evidence of metastatic disease. -Bone scan 08/17/2018-negative for metastatic disease -Elevated PSA July 2019 -PET scanscan 10/15/2019-negative for metastatic disease, evidence of sigmoid diverticulitis with a small contained perforation -Radiation-definitive, 10/29/2019-01/15/2020, interrupted 11/22/2019-12/17/2019 secondary to COVID-19 infection  3. Anemia 4. Deconditioning 5.COVID-19 infection December 2020 6.Orthostatic hypotension 03/19/2020 secondary to high output ileostomy 7.Hypomagnesemia secondary to high-output ileostomy, repleted IV 03/19/2020, 03/24/2020, 03/31/2020    Disposition: Mr. Ladnier  completed 1 cycle of FOLFOX.   The chemotherapy was complicated by delayed nausea.  Emend and home Decadron will be added with cycle 2.  I encouraged him to use Imodium 4 times per day.  We will add Lomotil if this does not slow the stool volume.  We will follow up on the magnesium and potassium levels from today and provide replacement as needed.  Mr. Laski will return for an office visit and cycle 3 FOLFOX in 2 weeks.  He has mild neutropenia today.  Ellen Henri will be added with this cycle.  We reviewed potential toxicities associated with Udenyca including the chance for bone pain, rash, and splenic rupture.  He agrees to proceed.  Betsy Coder, MD  04/15/2020  12:33 PM

## 2020-04-17 ENCOUNTER — Inpatient Hospital Stay: Payer: 59

## 2020-04-17 ENCOUNTER — Telehealth: Payer: Self-pay

## 2020-04-17 ENCOUNTER — Other Ambulatory Visit: Payer: Self-pay

## 2020-04-17 VITALS — BP 118/83 | HR 69 | Temp 98.2°F | Resp 18

## 2020-04-17 DIAGNOSIS — C181 Malignant neoplasm of appendix: Secondary | ICD-10-CM

## 2020-04-17 DIAGNOSIS — Z5111 Encounter for antineoplastic chemotherapy: Secondary | ICD-10-CM | POA: Diagnosis not present

## 2020-04-17 DIAGNOSIS — Z95828 Presence of other vascular implants and grafts: Secondary | ICD-10-CM

## 2020-04-17 MED ORDER — SODIUM CHLORIDE 0.9% FLUSH
10.0000 mL | Freq: Once | INTRAVENOUS | Status: AC
  Administered 2020-04-17: 10 mL
  Filled 2020-04-17: qty 10

## 2020-04-17 MED ORDER — PEGFILGRASTIM INJECTION 6 MG/0.6ML ~~LOC~~
6.0000 mg | PREFILLED_SYRINGE | Freq: Once | SUBCUTANEOUS | Status: AC
  Administered 2020-04-17: 6 mg via SUBCUTANEOUS

## 2020-04-17 MED ORDER — HEPARIN SOD (PORK) LOCK FLUSH 100 UNIT/ML IV SOLN
500.0000 [IU] | Freq: Once | INTRAVENOUS | Status: AC
  Administered 2020-04-17: 500 [IU]
  Filled 2020-04-17: qty 5

## 2020-04-17 MED ORDER — PEGFILGRASTIM INJECTION 6 MG/0.6ML ~~LOC~~
PREFILLED_SYRINGE | SUBCUTANEOUS | Status: AC
  Filled 2020-04-17: qty 0.6

## 2020-04-17 NOTE — Telephone Encounter (Signed)
Per Arbie Cookey in pharmacy  ok to give Neulasta injection today.

## 2020-04-17 NOTE — Patient Instructions (Signed)

## 2020-04-23 NOTE — Progress Notes (Signed)
Pharmacist Chemotherapy Monitoring - Follow Up Assessment    I verify that I have reviewed each item in the below checklist:  . Regimen for the patient is scheduled for the appropriate day and plan matches scheduled date. Marland Kitchen Appropriate non-routine labs are ordered dependent on drug ordered. . If applicable, additional medications reviewed and ordered per protocol based on lifetime cumulative doses and/or treatment regimen.   Plan for follow-up and/or issues identified: No . I-vent associated with next due treatment: No . MD and/or nursing notified: No  Juan Richards K 04/23/2020 8:37 AM

## 2020-04-26 ENCOUNTER — Other Ambulatory Visit: Payer: Self-pay | Admitting: Oncology

## 2020-04-28 ENCOUNTER — Encounter: Payer: Self-pay | Admitting: Internal Medicine

## 2020-04-28 ENCOUNTER — Other Ambulatory Visit: Payer: Self-pay

## 2020-04-28 ENCOUNTER — Ambulatory Visit (INDEPENDENT_AMBULATORY_CARE_PROVIDER_SITE_OTHER): Payer: 59 | Admitting: Internal Medicine

## 2020-04-28 VITALS — BP 160/90 | HR 109 | Temp 97.9°F | Ht 69.0 in | Wt 181.0 lb

## 2020-04-28 DIAGNOSIS — Z0000 Encounter for general adult medical examination without abnormal findings: Secondary | ICD-10-CM

## 2020-04-28 DIAGNOSIS — E440 Moderate protein-calorie malnutrition: Secondary | ICD-10-CM

## 2020-04-28 DIAGNOSIS — K529 Noninfective gastroenteritis and colitis, unspecified: Secondary | ICD-10-CM | POA: Insufficient documentation

## 2020-04-28 DIAGNOSIS — R7303 Prediabetes: Secondary | ICD-10-CM

## 2020-04-28 LAB — POCT GLYCOSYLATED HEMOGLOBIN (HGB A1C): Hemoglobin A1C: 5.5 % (ref 4.0–5.6)

## 2020-04-28 NOTE — Assessment & Plan Note (Signed)

## 2020-04-28 NOTE — Assessment & Plan Note (Signed)
stable overall by history and exam, recent data reviewed with pt, and pt to continue medical treatment as before,  to f/u any worsening symptoms or concerns  

## 2020-04-28 NOTE — Patient Instructions (Signed)
Your A1c was Ok today  Please continue all other medications as before, and refills have been done if requested.  Please have the pharmacy call with any other refills you may need.  Please keep your appointments with your specialists as you may have planned  Please make an Appointment to return in 6 months, or sooner if needed

## 2020-04-28 NOTE — Progress Notes (Addendum)
Subjective:    Patient ID: Juan Richards, male    DOB: 1956/02/28, 64 y.o.   MRN: [id number]  HPI  Here for wellness and f/u;  Overall doing ok;  Pt denies Chest pain, worsening SOB, DOE, wheezing, orthopnea, PND, worsening LE edema, palpitations, dizziness or syncope.  Pt denies neurological change such as new headache, facial or extremity weakness.  Pt denies polydipsia, polyuria, or low sugar symptoms. Pt states overall good compliance with treatment and medications, good tolerability, and has been trying to follow appropriate diet.  Pt denies worsening depressive symptoms, suicidal ideation or panic. No fever, night sweats, wt loss, loss of appetite, or other constitutional symptoms.  Pt states good ability with ADL's, has low fall risk, home safety reviewed and adequate, no other significant changes in hearing or vision, and only occasionally active with exercise.  Having some nausea and wt loss related it seems BP Readings from Last 3 Encounters:  04/28/20 (!) 160/90  04/17/20 118/83  04/15/20 127/76  has ongoing diarrhea, malnourishment with wt loss,.  BP usually < 140/90 at home.   Past Medical History:  Diagnosis Date  . Adenocarcinoma of appendix ([place]) 01/2020  . DIVERTICULITIS, COLON, WITH PERFORATION 07/23/2009  . Numbness and tingling    RIGHT TOES AND LEFT LEG DUE TO L 5 DISC   . PLEURISY YRS AGO  . Prostate cancer [facility]) 2019   prostatectomy  . 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 BACK SURGERY  2017   L4 BACK SURGERY  . COLON SURGERY Right 01/26/2020   hemicolectomy, ileostomy  . COLONOSCOPY    . LAPAROTOMY N/A 01/26/2020   Procedure: EXPLORATORY LAPAROTOMY  WITH RIGHT HEMICOLECTOMY,  END ILEOSTOMY, AND MUCUS FISTULA.;  Surgeon: [name], MD;  Location: [facility];  Service: General;  Laterality: N/A;  . PELVIC LYMPH NODE DISSECTION Bilateral 09/21/2018   Procedure: BILATERAL PELVIC LYMPH NODE DISSECTION;  Surgeon:  Ardis Hughs, MD;  Location: [facility];  Service: Urology;  Laterality: Bilateral;  . POLYPECTOMY    . PORTACATH PLACEMENT Left 03/10/2020   Procedure: PORT PLACEMENT;  Surgeon: [name], MD;  Location: [facility];  Service: General;  Laterality: Left;  . ROBOT ASSISTED LAPAROSCOPIC RADICAL PROSTATECTOMY N/A 09/21/2018   Procedure: XI ROBOTIC ASSISTED LAPAROSCOPIC RADICAL PROSTATECTOMY;  Surgeon: Ardis Hughs, MD;  Location: [facility];  Service: Urology;  Laterality: N/A;    reports that he has never smoked. He has never used smokeless tobacco. He reports current alcohol use of about 2.0 - 3.0 standard drinks of alcohol per week. He reports that he does not use drugs. family history includes Cancer in his father; Colon cancer in his brother; Diabetes in his maternal grandmother and mother; Kidney disease in his mother. No Known Allergies Current Outpatient Medications on File Prior to Visit  Medication Sig Dispense Refill  . acetaminophen (TYLENOL) 500 MG tablet You can take 1000 mg of Tylenol/acetaminophen every 6 hours as needed for pain.  You can buy this over-the-counter at any drugstore.  Do not take more than 4000 mg of Tylenol/acetaminophen per day, it can harm your liver. 30 tablet 0  . dexamethasone (DECADRON) 4 MG tablet Take 1 tablet (4 mg total) by mouth as directed. Take for 2 days after each chemo beginning on day 2 24 tablet 0  . lidocaine-prilocaine (EMLA) cream Apply 1 application topically as needed. 30 g 0  . loperamide (IMODIUM) 2 MG capsule Take  2 mg by mouth in the morning, at noon, in the evening, and at bedtime.    . magnesium oxide (MAG-OX) 400 MG tablet Take 400 mg by mouth 2 (two) times daily. Take 1 tablet 2 times daily with food.    . ondansetron (ZOFRAN-ODT) 8 MG disintegrating tablet Take 8 mg by mouth every 8 (eight) hours as needed for nausea or vomiting.    Marland Kitchen oxyCODONE (OXY IR/ROXICODONE) 5 MG immediate release tablet Take 1 tablet (5 mg  total) by mouth every 4 (four) hours as needed for breakthrough pain (Pain not relieved by plain Tylenol). 40 tablet 0  . pantoprazole (PROTONIX) 40 MG tablet Take 1 tablet (40 mg total) by mouth daily. 30 tablet 0  . prochlorperazine (COMPAZINE) 10 MG tablet Take 1 tablet (10 mg total) by mouth every 6 (six) hours as needed for nausea. 60 tablet 1   No current facility-administered medications on file prior to visit.   Review of Systems All otherwise neg per pt'    Objective:   Physical Exam BP (!) 160/90 (BP Location: Left Arm, Patient Position: Sitting, Cuff Size: Large)   Pulse (!) 109   Temp 97.9 F (36.6 C) (Oral)   Ht 5\' 9"  (1.753 m)   Wt 181 lb (82.1 kg)   SpO2 99%   BMI 26.73 kg/m  VS noted,  Constitutional: Pt appears in NAD HENT: Head: NCAT.  Right Ear: External ear normal.  Left Ear: External ear normal.  Eyes: . Pupils are equal, round, and reactive to light. Conjunctivae and EOM are normal Nose: without d/c or deformity Neck: Neck supple. Gross normal ROM Cardiovascular: Normal rate and regular rhythm.   Pulmonary/Chest: Effort normal and breath sounds without rales or wheezing.  Abd:  Soft, NT, ND, + BS, no organomegaly Neurological: Pt is alert. At baseline orientation, motor grossly intact Skin: Skin is warm. No rashes, other new lesions, no LE edema Psychiatric: Pt behavior is normal without agitation  All otherwise neg per pt Lab Results  Component Value Date   WBC 3.3 (L) 04/15/2020   HGB 10.0 (L) 04/15/2020   HCT 30.9 (L) 04/15/2020   PLT 261 04/15/2020   GLUCOSE 120 (H) 04/15/2020   CHOL 183 10/29/2019   TRIG 135.0 10/29/2019   HDL 55.50 10/29/2019   LDLCALC 100 (H) 10/29/2019   ALT 22 04/15/2020   AST 18 04/15/2020   NA 139 04/15/2020   K 4.3 04/15/2020   CL 104 04/15/2020   CREATININE 1.03 04/15/2020   BUN 9 04/15/2020   CO2 25 04/15/2020   TSH 0.97 10/29/2019   PSA 60.97 (H) 06/18/2018   INR 1.1 07/23/2009   HGBA1C 5.5 04/28/2020    POCT glycosylated hemoglobin (Hb A1C) Order: [id number] Status:  Final result Visible to patient:  Yes (MyChart) Dx:  Pre-diabetes  Ref Range & Units 1 d ago 6 mo ago 1 yr ago  Hemoglobin A1C 4.0 - 5.6 % 5.5  5.8 R, CM  6.2 R           Assessment & Plan:

## 2020-04-28 NOTE — Assessment & Plan Note (Signed)
Ok for boost twice per day

## 2020-04-28 NOTE — Assessment & Plan Note (Signed)
Agree with up to 8 immodium qd prn

## 2020-04-29 ENCOUNTER — Inpatient Hospital Stay: Payer: 59

## 2020-04-29 ENCOUNTER — Inpatient Hospital Stay: Payer: 59 | Admitting: Nutrition

## 2020-04-29 ENCOUNTER — Inpatient Hospital Stay: Payer: 59 | Attending: Oncology | Admitting: Oncology

## 2020-04-29 ENCOUNTER — Other Ambulatory Visit: Payer: Self-pay

## 2020-04-29 VITALS — BP 126/79 | HR 87 | Temp 97.8°F | Resp 17 | Ht 69.0 in | Wt 183.0 lb

## 2020-04-29 DIAGNOSIS — D649 Anemia, unspecified: Secondary | ICD-10-CM | POA: Diagnosis not present

## 2020-04-29 DIAGNOSIS — C181 Malignant neoplasm of appendix: Secondary | ICD-10-CM | POA: Insufficient documentation

## 2020-04-29 DIAGNOSIS — Z8546 Personal history of malignant neoplasm of prostate: Secondary | ICD-10-CM | POA: Diagnosis not present

## 2020-04-29 DIAGNOSIS — Z923 Personal history of irradiation: Secondary | ICD-10-CM | POA: Diagnosis not present

## 2020-04-29 DIAGNOSIS — Z95828 Presence of other vascular implants and grafts: Secondary | ICD-10-CM

## 2020-04-29 DIAGNOSIS — Z8616 Personal history of COVID-19: Secondary | ICD-10-CM | POA: Diagnosis not present

## 2020-04-29 DIAGNOSIS — Z79899 Other long term (current) drug therapy: Secondary | ICD-10-CM | POA: Insufficient documentation

## 2020-04-29 DIAGNOSIS — Z9049 Acquired absence of other specified parts of digestive tract: Secondary | ICD-10-CM | POA: Insufficient documentation

## 2020-04-29 DIAGNOSIS — C786 Secondary malignant neoplasm of retroperitoneum and peritoneum: Secondary | ICD-10-CM | POA: Insufficient documentation

## 2020-04-29 DIAGNOSIS — Z7952 Long term (current) use of systemic steroids: Secondary | ICD-10-CM | POA: Diagnosis not present

## 2020-04-29 DIAGNOSIS — R309 Painful micturition, unspecified: Secondary | ICD-10-CM | POA: Diagnosis not present

## 2020-04-29 DIAGNOSIS — Z9079 Acquired absence of other genital organ(s): Secondary | ICD-10-CM | POA: Insufficient documentation

## 2020-04-29 DIAGNOSIS — R200 Anesthesia of skin: Secondary | ICD-10-CM | POA: Insufficient documentation

## 2020-04-29 DIAGNOSIS — Z5189 Encounter for other specified aftercare: Secondary | ICD-10-CM | POA: Insufficient documentation

## 2020-04-29 DIAGNOSIS — Z5111 Encounter for antineoplastic chemotherapy: Secondary | ICD-10-CM | POA: Insufficient documentation

## 2020-04-29 LAB — URINALYSIS, COMPLETE (UACMP) WITH MICROSCOPIC
Bilirubin Urine: NEGATIVE
Glucose, UA: NEGATIVE mg/dL
Hgb urine dipstick: NEGATIVE
Ketones, ur: 5 mg/dL — AB
Leukocytes,Ua: NEGATIVE
Nitrite: NEGATIVE
Protein, ur: 30 mg/dL — AB
Specific Gravity, Urine: 1.023 (ref 1.005–1.030)
pH: 5 (ref 5.0–8.0)

## 2020-04-29 LAB — CMP (CANCER CENTER ONLY)
ALT: 27 U/L (ref 0–44)
AST: 20 U/L (ref 15–41)
Albumin: 3.9 g/dL (ref 3.5–5.0)
Alkaline Phosphatase: 134 U/L — ABNORMAL HIGH (ref 38–126)
Anion gap: 9 (ref 5–15)
BUN: 13 mg/dL (ref 8–23)
CO2: 24 mmol/L (ref 22–32)
Calcium: 10.6 mg/dL — ABNORMAL HIGH (ref 8.9–10.3)
Chloride: 102 mmol/L (ref 98–111)
Creatinine: 0.89 mg/dL (ref 0.61–1.24)
GFR, Est AFR Am: >60 mL/min (ref >60)
GFR, Estimated: >60 mL/min (ref >60)
Glucose, Bld: 81 mg/dL (ref 70–99)
Potassium: 4.5 mmol/L (ref 3.5–5.1)
Sodium: 135 mmol/L (ref 135–145)
Total Bilirubin: 0.3 mg/dL (ref 0.3–1.2)
Total Protein: 8.5 g/dL — ABNORMAL HIGH (ref 6.5–8.1)

## 2020-04-29 LAB — CBC WITH DIFFERENTIAL (CANCER CENTER ONLY)
Abs Immature Granulocytes: 0.39 10*3/uL — ABNORMAL HIGH (ref 0.00–0.07)
Basophils Absolute: 0.1 10*3/uL (ref 0.0–0.1)
Basophils Relative: 1 %
Eosinophils Absolute: 0.1 10*3/uL (ref 0.0–0.5)
Eosinophils Relative: 0 %
HCT: 33.2 % — ABNORMAL LOW (ref 39.0–52.0)
Hemoglobin: 10.9 g/dL — ABNORMAL LOW (ref 13.0–17.0)
Immature Granulocytes: 3 %
Lymphocytes Relative: 11 %
Lymphs Abs: 1.5 10*3/uL (ref 0.7–4.0)
MCH: 29.6 pg (ref 26.0–34.0)
MCHC: 32.8 g/dL (ref 30.0–36.0)
MCV: 90.2 fL (ref 80.0–100.0)
Monocytes Absolute: 0.7 10*3/uL (ref 0.1–1.0)
Monocytes Relative: 5 %
Neutro Abs: 11.1 10*3/uL — ABNORMAL HIGH (ref 1.7–7.7)
Neutrophils Relative %: 80 %
Platelet Count: 140 10*3/uL — ABNORMAL LOW (ref 150–400)
RBC: 3.68 MIL/uL — ABNORMAL LOW (ref 4.22–5.81)
RDW: 15.1 % (ref 11.5–15.5)
WBC Count: 13.8 10*3/uL — ABNORMAL HIGH (ref 4.0–10.5)
nRBC: 0 % (ref 0.0–0.2)

## 2020-04-29 LAB — MAGNESIUM: Magnesium: 1.5 mg/dL — ABNORMAL LOW (ref 1.7–2.4)

## 2020-04-29 MED ORDER — MAGNESIUM SULFATE 2 GM/50ML IV SOLN
2.0000 g | Freq: Once | INTRAVENOUS | Status: AC
  Administered 2020-04-29: 2 g via INTRAVENOUS

## 2020-04-29 MED ORDER — SODIUM CHLORIDE 0.9 % IV SOLN
150.0000 mg | Freq: Once | INTRAVENOUS | Status: AC
  Administered 2020-04-29: 150 mg via INTRAVENOUS
  Filled 2020-04-29: qty 150

## 2020-04-29 MED ORDER — SODIUM CHLORIDE 0.9 % IV SOLN
2000.0000 mg/m2 | INTRAVENOUS | Status: DC
  Administered 2020-04-29: 4050 mg via INTRAVENOUS
  Filled 2020-04-29: qty 81

## 2020-04-29 MED ORDER — PALONOSETRON HCL INJECTION 0.25 MG/5ML
INTRAVENOUS | Status: AC
  Filled 2020-04-29: qty 5

## 2020-04-29 MED ORDER — ALTEPLASE 2 MG IJ SOLR
INTRAMUSCULAR | Status: AC
  Filled 2020-04-29: qty 2

## 2020-04-29 MED ORDER — ALTEPLASE 2 MG IJ SOLR
2.0000 mg | Freq: Once | INTRAMUSCULAR | Status: AC
  Administered 2020-04-29: 2 mg
  Filled 2020-04-29: qty 2

## 2020-04-29 MED ORDER — OXALIPLATIN CHEMO INJECTION 100 MG/20ML
85.0000 mg/m2 | Freq: Once | INTRAVENOUS | Status: AC
  Administered 2020-04-29: 170 mg via INTRAVENOUS
  Filled 2020-04-29: qty 34

## 2020-04-29 MED ORDER — SODIUM CHLORIDE 0.9 % IV SOLN
10.0000 mg | Freq: Once | INTRAVENOUS | Status: AC
  Administered 2020-04-29: 10 mg via INTRAVENOUS
  Filled 2020-04-29: qty 10

## 2020-04-29 MED ORDER — DEXTROSE 5 % IV SOLN
Freq: Once | INTRAVENOUS | Status: AC
  Filled 2020-04-29: qty 250

## 2020-04-29 MED ORDER — PALONOSETRON HCL INJECTION 0.25 MG/5ML
0.2500 mg | Freq: Once | INTRAVENOUS | Status: AC
  Administered 2020-04-29: 0.25 mg via INTRAVENOUS

## 2020-04-29 MED ORDER — MAGNESIUM SULFATE 2 GM/50ML IV SOLN
INTRAVENOUS | Status: AC
  Filled 2020-04-29: qty 50

## 2020-04-29 MED ORDER — LEUCOVORIN CALCIUM INJECTION 350 MG
400.0000 mg/m2 | Freq: Once | INTRAVENOUS | Status: AC
  Administered 2020-04-29: 808 mg via INTRAVENOUS
  Filled 2020-04-29: qty 40.4

## 2020-04-29 NOTE — Progress Notes (Signed)
Per Dr. Miles Costain, okay to give magnesium 2 g IV today for level of 1.5.  Demetrius Charity, PharmD, BCPS, [facility] Pharmacist Pharmacy Phone: [phone] 04/29/2020

## 2020-04-29 NOTE — Progress Notes (Signed)
Nutrition follow-up completed with patient during treatment for appendiceal cancer.   Weight was documented as 183 pounds on May 12 stable overall from 184.2 pounds on April 28.  Patient has lost 20% of his body weight since the fall 2020. Patient reports he had nausea after treatment because he did not know how to take his nausea medication.  Reports MD has explained this to him and he understands now. Patient complains of continued large output from ileostomy.  He has been told to increase Imodium up to 8 daily. Denies problems with oral intake.  Nutrition diagnosis: Food and nutrition related knowledge deficit improved.  Intervention: Educated patient on strategies for eating with nausea and vomiting and encourage patient to take nausea medicine as prescribed and instructed by MD. Encouraged bowel regimen. Recommended increased fluid intake secondary to ostomy output. Stressed importance of weight maintenance. Patient verbalized understanding of education provided.  Monitoring, evaluation, goals: Patient will work to increase overall intake and minimize weight loss.  Next visit: Patient agrees to contact me with questions and concerns and has my contact information.  **Disclaimer: This note was dictated with voice recognition software. Similar sounding words can inadvertently be transcribed and this note may contain transcription errors which may not have been corrected upon publication of note.**

## 2020-04-29 NOTE — Patient Instructions (Addendum)
Per Dr. Benay Spice: Increase your Imodium to 6/day to control loose stools                           Take your dexamethasone as ordered to help prevent delayed nausea.  [facility] Discharge Instructions for Patients Receiving Chemotherapy  Today you received the following chemotherapy agents Oxaliplatin (ELOXATIN), Leucovorin & Flourouracil (ADRUCIL).  To help prevent nausea and vomiting after your treatment, we encourage you to take your nausea medication  as prescribed.  If you develop nausea and vomiting that is not controlled by your nausea medication, call the clinic.   BELOW ARE SYMPTOMS THAT SHOULD BE REPORTED IMMEDIATELY:  *FEVER GREATER THAN 100.5 F  *CHILLS WITH OR WITHOUT FEVER  NAUSEA AND VOMITING THAT IS NOT CONTROLLED WITH YOUR NAUSEA MEDICATION  *UNUSUAL SHORTNESS OF BREATH  *UNUSUAL BRUISING OR BLEEDING  TENDERNESS IN MOUTH AND THROAT WITH OR WITHOUT PRESENCE OF ULCERS  *URINARY PROBLEMS  *BOWEL PROBLEMS  UNUSUAL RASH Items with * indicate a potential emergency and should be followed up as soon as possible.  Feel free to call the clinic should you have any questions or concerns. The clinic phone number is [phone].  Please show the Protection at check-in to the Emergency Department and triage nurse.  Magnesium Sulfate injection What is this medicine? MAGNESIUM SULFATE (mag NEE zee um SUL fate) is an electrolyte injection commonly used to treat low magnesium levels in your blood. It is also used to prevent or control seizures in women with preeclampsia or eclampsia. This medicine may be used for other purposes; ask your health care provider or pharmacist if you have questions. What should I tell my health care provider before I take this medicine? They need to know if you have any of these conditions:  heart disease  history of irregular heart beat  kidney disease  an unusual or allergic reaction to magnesium sulfate,  medicines, foods, dyes, or preservatives  pregnant or trying to get pregnant  breast-feeding How should I use this medicine? This medicine is for infusion into a vein. It is given by a health care professional in a hospital or clinic setting. Talk to your pediatrician regarding the use of this medicine in children. While this drug may be prescribed for selected conditions, precautions do apply. Overdosage: If you think you have taken too much of this medicine contact a poison control center or emergency room at once. NOTE: This medicine is only for you. Do not share this medicine with others. What if I miss a dose? This does not apply. What may interact with this medicine? This medicine may interact with the following medications:  certain medicines for anxiety or sleep  certain medicines for seizures like phenobarbital  digoxin  medicines that relax muscles for surgery  narcotic medicines for pain This list may not describe all possible interactions. Give your health care provider a list of all the medicines, herbs, non-prescription drugs, or dietary supplements you use. Also tell them if you smoke, drink alcohol, or use illegal drugs. Some items may interact with your medicine. What should I watch for while using this medicine? Your condition will be monitored carefully while you are receiving this medicine. You may need blood work done while you are receiving this medicine. What side effects may I notice from receiving this medicine? Side effects that you should report to your doctor or health care professional as soon as possible:  allergic reactions like skin rash, itching or hives, swelling of the face, lips, or tongue  facial flushing  muscle weakness  signs and symptoms of low blood pressure like dizziness; feeling faint or lightheaded, falls; unusually weak or tired  signs and symptoms of a dangerous change in heartbeat or heart rhythm like chest pain; dizziness; fast  or irregular heartbeat; palpitations; breathing problems  sweating This list may not describe all possible side effects. Call your doctor for medical advice about side effects. You may report side effects to FDA at 1-800-FDA-1088. Where should I keep my medicine? This drug is given in a hospital or clinic and will not be stored at home. NOTE: This sheet is a summary. It may not cover all possible information. If you have questions about this medicine, talk to your doctor, pharmacist, or health care provider.  2020 Elsevier/Gold Standard (2016-06-22 12:31:42)

## 2020-04-29 NOTE — Progress Notes (Signed)
[facility] OFFICE PROGRESS NOTE   Diagnosis: Appendix carcinoma  INTERVAL HISTORY:   Juan Richards completed another cycle of FOLFOX on 04/15/2020.  No mouth sores.  He reports mild intermittent numbness in the feet.  No hand numbness.  He continues to have frequent output from the ileostomy.  He is taking Imodium 4 times per day.  He empties the ostomy 6-8 times per day.  The stool is sometimes formed.  Good appetite.  He had nausea beginning 1 week following chemotherapy.  He did not take Decadron prophylaxis.  He reports burning with urination. No pain following Neulasta. The left leg has been larger than the right side for the past 2-3 weeks.  Objective:  Vital signs in last 24 hours:  Blood pressure 126/79, pulse 87, temperature 97.8 F (36.6 C), temperature source Temporal, resp. rate 17, height '5\' 9"'$  (1.753 m), weight 183 lb (83 kg), SpO2 100 %.    HEENT: No thrush or ulcers Resp: Lungs clear bilaterally Cardio: Regular rate and rhythm GI: No hepatomegaly, nontender, right lower quadrant ileostomy with liquid stool, small amount of tan material in the mucous fistula bag, the midline wound has almost completely healed Vascular: The left lower thigh and lower leg is slightly larger than the right side    Portacath/PICC-without erythema  Lab Results:  Lab Results  Component Value Date   WBC 13.8 (H) 04/29/2020   HGB 10.9 (L) 04/29/2020   HCT 33.2 (L) 04/29/2020   MCV 90.2 04/29/2020   PLT 140 (L) 04/29/2020   NEUTROABS 11.1 (H) 04/29/2020    CMP  Lab Results  Component Value Date   NA 135 04/29/2020   K 4.5 04/29/2020   CL 102 04/29/2020   CO2 24 04/29/2020   GLUCOSE 81 04/29/2020   BUN 13 04/29/2020   CREATININE 0.89 04/29/2020   CALCIUM 10.6 (H) 04/29/2020   PROT 8.5 (H) 04/29/2020   ALBUMIN 3.9 04/29/2020   AST 20 04/29/2020   ALT 27 04/29/2020   ALKPHOS 134 (H) 04/29/2020   BILITOT 0.3 04/29/2020   GFRNONAA >60 04/29/2020   GFRAA >60  04/29/2020    Lab Results  Component Value Date   CEA1 1.53 03/02/2020     Medications: I have reviewed the patient's current medications.   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, 12 tumor deposits, 0/10 lymph nodes positive, negative resection margins, metastatic carcinoma involving the ileal mesentery noted, low tumor purity with decreased sensitivity for detection of HER-2 copy number and other alterations, microsatellite status and tumor mutation burden could not be determined,MSS, no loss of mismatch repair protein expression -Diffuse subcentimeter nodularity over the mesentery and bowel, sigmoid colon adherent to bladder and left middle pelvic sidewall, gross contaminationwith succus -CTs 03/12/2020-no chest metastases, long segment of sigmoid colon thickening, no discrete peritoneal nodule, nodular anterior bladder surface             -Cycle 1 FOLFOX 03/31/2020             -Cycle 2 FOLFOX 04/15/2020, Emend added             -Cycle 3 FOLFOX 04/29/2020, home Decadron prophylaxis added  2.Prostate cancer diagnosed in August 2019, robotic assisted prostatectomy 09/21/2018-Gleason 7 acinar tumor,pT3pN1,invasion of seminal vesicles, 3/16 lymph nodes positive,  positive right anterior margin positive -Gleason score 4+3 equal 7 and PSA of 60.97. -Staging work-up did not show any evidence of metastatic disease. -Bone scan 08/17/2018-negative for metastatic disease -Elevated PSA July 2019 -PET scanscan 10/15/2019-negative for metastatic disease, evidence of sigmoid  diverticulitis with a small contained perforation -Radiation-definitive, 10/29/2019-01/15/2020, interrupted 11/22/2019-12/17/2019 secondary to COVID-19 infection  3. Anemia 4. Deconditioning 5.COVID-19 infection December 2020 6.Orthostatic hypotension 03/19/2020 secondary to high output ileostomy 7.Hypomagnesemia secondary to high-output ileostomy, repleted IV 03/19/2020, 03/24/2020, 03/31/2020     Disposition: Juan Richards appears unchanged.  His weight has stabilized.  He will increase the use of Imodium to 6-8/day.  He will take home Decadron prophylaxis following this cycle of chemotherapy. Juan Richards will receive IV magnesium today.  He will complete another cycle of FOLFOX today with G-CSF support.  We will check a left leg Doppler and urinalysis today.  Juan Richards will return for an office visit and chemotherapy in 2 weeks.  The plan is to complete approximately 6 cycles of FOLFOX prior to consultation with Dr. [name]. Betsy Coder, MD  04/29/2020  1:34 PM

## 2020-04-30 ENCOUNTER — Telehealth: Payer: Self-pay | Admitting: Oncology

## 2020-04-30 ENCOUNTER — Ambulatory Visit (HOSPITAL_COMMUNITY)
Admission: RE | Admit: 2020-04-30 | Discharge: 2020-04-30 | Disposition: A | Discharge status: Home / Self Care | Payer: 59 | Source: Ambulatory Visit | Attending: Oncology | Admitting: Oncology

## 2020-04-30 DIAGNOSIS — C181 Malignant neoplasm of appendix: Secondary | ICD-10-CM | POA: Diagnosis present

## 2020-04-30 LAB — URINE CULTURE: Culture: <10000 — AB

## 2020-04-30 NOTE — Progress Notes (Signed)
Left lower extremity venous duplex has been completed. Preliminary results can be found in CV Proc through chart review.  Results were given to Manuela Schwartz at Dr. Gearldine Shown office.  04/30/20 1:19 PM Juan Richards RVT

## 2020-04-30 NOTE — Telephone Encounter (Signed)
Scheduled per los. Called and spoke with patient. Confirmed appt 

## 2020-05-01 ENCOUNTER — Inpatient Hospital Stay: Payer: 59

## 2020-05-01 ENCOUNTER — Other Ambulatory Visit: Payer: Self-pay

## 2020-05-01 VITALS — BP 128/72 | HR 78 | Temp 98.2°F | Resp 18

## 2020-05-01 DIAGNOSIS — C181 Malignant neoplasm of appendix: Secondary | ICD-10-CM

## 2020-05-01 DIAGNOSIS — Z5111 Encounter for antineoplastic chemotherapy: Secondary | ICD-10-CM | POA: Diagnosis not present

## 2020-05-01 MED ORDER — PEGFILGRASTIM INJECTION 6 MG/0.6ML ~~LOC~~
6.0000 mg | PREFILLED_SYRINGE | Freq: Once | SUBCUTANEOUS | Status: AC
  Administered 2020-05-01: 6 mg via SUBCUTANEOUS

## 2020-05-01 MED ORDER — HEPARIN SOD (PORK) LOCK FLUSH 100 UNIT/ML IV SOLN
500.0000 [IU] | Freq: Once | INTRAVENOUS | Status: AC | PRN
  Administered 2020-05-01: 500 [IU]
  Filled 2020-05-01: qty 5

## 2020-05-01 MED ORDER — SODIUM CHLORIDE 0.9% FLUSH
10.0000 mL | INTRAVENOUS | Status: DC | PRN
  Administered 2020-05-01: 10 mL
  Filled 2020-05-01: qty 10

## 2020-05-06 NOTE — Progress Notes (Signed)
Pharmacist Chemotherapy Monitoring - Follow Up Assessment    I verify that I have reviewed each item in the below checklist:  . Regimen for the patient is scheduled for the appropriate day and plan matches scheduled date. Marland Kitchen Appropriate non-routine labs are ordered dependent on drug ordered. . If applicable, additional medications reviewed and ordered per protocol based on lifetime cumulative doses and/or treatment regimen.   Plan for follow-up and/or issues identified: No . I-vent associated with next due treatment: No . MD and/or nursing notified: No   Kennith Center, Pharm.D., CPP 05/06/2020@4 :21 PM

## 2020-05-08 ENCOUNTER — Telehealth: Payer: Self-pay | Admitting: *Deleted

## 2020-05-08 NOTE — Telephone Encounter (Signed)
Death in family and funeral is on 05/12/20. Asking to move his 5/25 appointments to 05/13/20. Forwarded request to scheduler via scheduling message.

## 2020-05-10 ENCOUNTER — Other Ambulatory Visit: Payer: Self-pay | Admitting: Oncology

## 2020-05-11 NOTE — Progress Notes (Addendum)
[place]   [phone] Fax:([phone]) [phone]   Clinic Follow up Note   Patient Care Team: [name], MD as PCP - General (Internal Medicine) Cira Rue, RN Nurse Navigator as Registered Nurse (Medical Oncology) Tyler Pita, MD as Consulting Physician (Radiation Oncology) Jonnie Finner, RN as Oncology Nurse Navigator Ladell Pier, MD as Consulting Physician (Oncology) 05/12/2020  CHIEF COMPLAINT: F/u appendix cancer   INTERVAL HISTORY: Juan Richards returns for f/u and treatment as scheduled. He completed cycle 3 FOLFOX on 5/12. A doppler of the left lower leg was negative for DVT on 04/30/20. He still has cramping in the left toes. Last treatment went well. He continues to have high ileostomy output, empties bag at least 8 times daily. Stool is mostly liquid but occasionally soft/formed. He continues to eat and drink, diarrhea worse after certain foods. He has occasional dizziness when he sits up. Not on BP meds. He has nausea the week leading up to chemo. N/v after chemo improved with dex. Cold sensitivity lasts 10-14 days, no residual neuropathy. He has mild pain to the right of his incision which is now closed. Denies fever, chills, cough, chest pain, dyspnea, or new issues.    MEDICAL HISTORY:  Past Medical History:  Diagnosis Date  . 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 DIVERTICULITIS, COLON, WITH PERFORATION 07/23/2009  . Numbness and tingling    RIGHT TOES AND LEFT LEG DUE TO L 5 DISC   . PLEURISY YRS AGO  . Prostate cancer [facility]) 2019   prostatectomy  . STENOSIS, LUMBAR SPINE 09/27/2007    SURGICAL HISTORY: Past Surgical History:  Procedure Laterality Date  . BACK SURGERY  2017   L4 BACK SURGERY  . COLON SURGERY Right 01/26/2020   hemicolectomy, ileostomy  . COLONOSCOPY    . LAPAROTOMY N/A 01/26/2020   Procedure: EXPLORATORY LAPAROTOMY  WITH RIGHT HEMICOLECTOMY,  END ILEOSTOMY, AND MUCUS FISTULA.;  Surgeon: [name], MD;   Location: [place];  Service: General;  Laterality: N/A;  . PELVIC LYMPH NODE DISSECTION Bilateral 09/21/2018   Procedure: BILATERAL PELVIC LYMPH NODE DISSECTION;  Surgeon: Ardis Hughs, MD;  Location: [facility];  Service: Urology;  Laterality: Bilateral;  . POLYPECTOMY    . PORTACATH PLACEMENT Left 03/10/2020   Procedure: PORT PLACEMENT;  Surgeon: [name], MD;  Location: [place];  Service: General;  Laterality: Left;  . ROBOT ASSISTED LAPAROSCOPIC RADICAL PROSTATECTOMY N/A 09/21/2018   Procedure: XI ROBOTIC ASSISTED LAPAROSCOPIC RADICAL PROSTATECTOMY;  Surgeon: Ardis Hughs, MD;  Location: [facility];  Service: Urology;  Laterality: N/A;    I have reviewed the social history and family history with the patient and they are unchanged from previous note.  ALLERGIES:  has No Known Allergies.  MEDICATIONS:  Current Outpatient Medications  Medication Sig Dispense Refill  . acetaminophen (TYLENOL) 500 MG tablet You can take 1000 mg of Tylenol/acetaminophen every 6 hours as needed for pain.  You can buy this over-the-counter at any drugstore.  Do not take more than 4000 mg of Tylenol/acetaminophen per day, it can harm your liver. 30 tablet 0  . lidocaine-prilocaine (EMLA) cream Apply 1 application topically as needed. 30 g 0  . loperamide (IMODIUM) 2 MG capsule Take 2 mg by mouth in the morning, at noon, in the evening, and at bedtime.    . magnesium oxide (MAG-OX) 400 MG tablet Take 400 mg by mouth 2 (two) times daily. Take 1 tablet 2 times daily with food.    Marland Kitchen  ondansetron (ZOFRAN-ODT) 8 MG disintegrating tablet Take 8 mg by mouth every 8 (eight) hours as needed for nausea or vomiting.    Marland Kitchen oxyCODONE (OXY IR/ROXICODONE) 5 MG immediate release tablet Take 1 tablet (5 mg total) by mouth every 4 (four) hours as needed for breakthrough pain (Pain not relieved by plain Tylenol). 40 tablet 0  . pantoprazole (PROTONIX) 40 MG tablet Take 1 tablet (40 mg total) by mouth daily. 30  tablet 0  . prochlorperazine (COMPAZINE) 10 MG tablet Take 1 tablet (10 mg total) by mouth every 6 (six) hours as needed for nausea. 60 tablet 1  . dexamethasone (DECADRON) 4 MG tablet Take 1 tablet (4 mg total) by mouth as directed. Take for 2 days after each chemo beginning on day 2 (Patient not taking: Reported on 05/12/2020) 24 tablet 0  . diphenoxylate-atropine (LOMOTIL) 2.5-0.025 MG tablet Take 2 tablets by mouth 4 (four) times daily as needed for diarrhea or loose stools. 30 tablet 0   No current facility-administered medications for this visit.   Facility-Administered Medications Ordered in Other Visits  Medication Dose Route Frequency Provider Last Rate Last Admin  . dexamethasone (DECADRON) 10 mg in sodium chloride 0.9 % 50 mL IVPB  10 mg Intravenous Once Ladell Pier, MD      . dextrose 5 % solution   Intravenous Once Ladell Pier, MD      . fluorouracil (ADRUCIL) 4,050 mg in sodium chloride 0.9 % 69 mL chemo infusion  2,000 mg/m2 (Treatment Plan Recorded) Intravenous 1 day or 1 dose Ladell Pier, MD      . fosaprepitant (EMEND) 150 mg in sodium chloride 0.9 % 145 mL IVPB  150 mg Intravenous Once Ladell Pier, MD      . leucovorin 808 mg in dextrose 5 % 250 mL infusion  400 mg/m2 (Treatment Plan Recorded) Intravenous Once Ladell Pier, MD      . magnesium sulfate IVPB 4 g 100 mL  4 g Intravenous Once Cira Rue K, NP      . oxaliplatin (ELOXATIN) 170 mg in dextrose 5 % 500 mL chemo infusion  85 mg/m2 (Treatment Plan Recorded) Intravenous Once Ladell Pier, MD      . palonosetron (ALOXI) injection 0.25 mg  0.25 mg Intravenous Once Betsy Coder B, MD      . sodium chloride flush (NS) 0.9 % injection 10 mL  10 mL Intracatheter PRN Ladell Pier, MD        PHYSICAL EXAMINATION: ECOG PERFORMANCE STATUS: 1 - Symptomatic but completely ambulatory  Vitals:   05/12/20 1215  BP: 125/78  Pulse: 70  Resp: 17  Temp: 98.6 F (37 C)  SpO2: 100%   Filed  Weights   05/12/20 1215  Weight: 184 lb 12.8 oz (83.8 kg)    GENERAL:alert, no distress and comfortable SKIN: no rash  EYES: sclera clear LUNGS: clear with normal breathing effort HEART: regular rate & rhythm, trace swelling in the left lower leg  ABDOMEN: abdomen soft, mildly tender in RLQ. Incision closed. Positive bowel sounds. Liquid stool in collection bag  NEURO: alert & oriented x 3 with fluent speech, normal gait PAC without erythema   LABORATORY DATA:  I have reviewed the data as listed CBC Latest Ref Rng & Units 05/12/2020 04/29/2020 04/15/2020  WBC 4.0 - 10.5 K/uL 6.3 13.8(H) 3.3(L)  Hemoglobin 13.0 - 17.0 g/dL 9.2(L) 10.9(L) 10.0(L)  Hematocrit 39.0 - 52.0 % 28.7(L) 33.2(L) 30.9(L)  Platelets 150 - 400  K/uL 211 140(L) 261     CMP Latest Ref Rng & Units 05/12/2020 04/29/2020 04/15/2020  Glucose 70 - 99 mg/dL 90 81 120(H)  BUN 8 - 23 mg/dL '10 13 9  '$ Creatinine 0.61 - 1.24 mg/dL 0.84 0.89 1.03  Sodium 135 - 145 mmol/L 136 135 139  Potassium 3.5 - 5.1 mmol/L 3.9 4.5 4.3  Chloride 98 - 111 mmol/L 104 102 104  CO2 22 - 32 mmol/L '26 24 25  '$ Calcium 8.9 - 10.3 mg/dL 10.0 10.6(H) 10.4(H)  Total Protein 6.5 - 8.1 g/dL 7.6 8.5(H) 8.0  Total Bilirubin 0.3 - 1.2 mg/dL 0.2(L) 0.3 0.4  Alkaline Phos 38 - 126 U/L 103 134(H) 79  AST 15 - 41 U/L '20 20 18  '$ ALT 0 - 44 U/L '22 27 22      '$ RADIOGRAPHIC STUDIES: I have personally reviewed the radiological images as listed and agreed with the findings in the report. No results found.   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, 12 tumor deposits, 0/10 lymph nodes positive, negative resection margins, metastatic  carcinoma involving the ileal mesentery noted, low tumor purity with decreased sensitivity for detection of HER-2 copy number and other alterations, microsatellite status and tumor mutation burden could not be determined,MSS, no loss of mismatch repair protein expression -Diffuse subcentimeter nodularity over the mesentery and bowel, sigmoid colon adherent to bladder and left middle pelvic sidewall, gross contaminationwith succus -CTs 03/12/2020-no chest metastases, long segment of sigmoid colon thickening, no discrete peritoneal nodule, nodular anterior bladder surface -Cycle 1 FOLFOX 03/31/2020             -Cycle 2 FOLFOX 04/15/2020, Emend added             -Cycle 3 FOLFOX 04/29/2020, home Decadron prophylaxis added  -Cycle 4 FOLFOX 05/12/2020   2.Prostate cancer diagnosed in August 2019, robotic assisted prostatectomy 09/21/2018-Gleason 7 acinar tumor,pT3pN1,invasion of seminal vesicles, 3/16 lymph nodes positive, positive right anterior margin positive -Gleason score 4+3 equal 7 and PSA of 60.97. -Staging work-up did not show any evidence of metastatic disease. -Bone scan 08/17/2018-negative for metastatic disease -Elevated PSA July 2019 -PET scanscan 10/15/2019-negative for metastatic disease, evidence of sigmoid diverticulitis with a small contained perforation -Radiation-definitive, 10/29/2019-01/15/2020, interrupted 11/22/2019-12/17/2019 secondary to COVID-19 infection  3. Anemia 4. Deconditioning 5.COVID-19 infection December 2020 6.Orthostatic hypotension 03/19/2020 secondary to high output ileostomy 7.Hypomagnesemia secondary to high-output ileostomy, repleted IV 03/19/2020,03/24/2020,03/31/2020, 05/12/20  Disposition:  Juan Richards appears stable. He completed 3 cycles of FOLFOX chemotherapy. He tolerates moderately well. Po decadron improved his nausea. He  continues to have high ileostomy output which is contributing to hypomagnesemia. We will replete with 4 gm IV today, he will continue po BID. He has signs of positional blood pressure changes, I recommend to increase po hydration and add lomotil 2 tab QID and alternate with imodium to better control his GI output.    CBC and CMP are adequate for treatment. He will proceed with cycle 4 FOLFOX and G-CSF support. He will return for f/u and cycle 5 in 2 weeks. The plan is to restage after 6 cycles. The patient was seen with Dr. Benay Spice today.    Orders Placed This Encounter  Procedures  . CBC with Differential ([place])  Standing Status:   Future    Standing Expiration Date:   05/12/2021  . CMP ([place] only)    Standing Status:   Future    Standing Expiration Date:   05/12/2021  . Magnesium    Standing Status:   Future    Standing Expiration Date:   05/12/2021   All questions were answered. The patient knows to call the clinic with any problems, questions or concerns. No barriers to learning was detected.     Alla Feeling, NP 05/12/20  This was a shared visit with Cira Rue. Mr. Venus was interviewed and examined. He is tolerating the FOLFOX chemotherapy well. He will receive IV magnesium today. We encouraged him to continue Imodium. Lomotil was added to the antidiarrhea regimen.  He will complete cycle 4 FOLFOX today.  Julieanne Manson, MD

## 2020-05-12 ENCOUNTER — Inpatient Hospital Stay (HOSPITAL_BASED_OUTPATIENT_CLINIC_OR_DEPARTMENT_OTHER): Payer: 59 | Admitting: Nurse Practitioner

## 2020-05-12 ENCOUNTER — Inpatient Hospital Stay: Payer: 59

## 2020-05-12 ENCOUNTER — Encounter: Payer: Self-pay | Admitting: Nurse Practitioner

## 2020-05-12 ENCOUNTER — Telehealth: Payer: Self-pay

## 2020-05-12 ENCOUNTER — Other Ambulatory Visit: Payer: Self-pay

## 2020-05-12 VITALS — BP 125/78 | HR 70 | Temp 98.6°F | Resp 17 | Ht 69.0 in | Wt 184.8 lb

## 2020-05-12 DIAGNOSIS — Z5111 Encounter for antineoplastic chemotherapy: Secondary | ICD-10-CM | POA: Diagnosis not present

## 2020-05-12 DIAGNOSIS — C181 Malignant neoplasm of appendix: Secondary | ICD-10-CM

## 2020-05-12 DIAGNOSIS — Z95828 Presence of other vascular implants and grafts: Secondary | ICD-10-CM

## 2020-05-12 LAB — CBC WITH DIFFERENTIAL (CANCER CENTER ONLY)
Abs Immature Granulocytes: 0.12 10*3/uL — ABNORMAL HIGH (ref 0.00–0.07)
Basophils Absolute: 0.1 10*3/uL (ref 0.0–0.1)
Basophils Relative: 1 %
Eosinophils Absolute: 0.1 10*3/uL (ref 0.0–0.5)
Eosinophils Relative: 2 %
HCT: 28.7 % — ABNORMAL LOW (ref 39.0–52.0)
Hemoglobin: 9.2 g/dL — ABNORMAL LOW (ref 13.0–17.0)
Immature Granulocytes: 2 %
Lymphocytes Relative: 25 %
Lymphs Abs: 1.6 10*3/uL (ref 0.7–4.0)
MCH: 29.4 pg (ref 26.0–34.0)
MCHC: 32.1 g/dL (ref 30.0–36.0)
MCV: 91.7 fL (ref 80.0–100.0)
Monocytes Absolute: 0.5 10*3/uL (ref 0.1–1.0)
Monocytes Relative: 8 %
Neutro Abs: 4 10*3/uL (ref 1.7–7.7)
Neutrophils Relative %: 62 %
Platelet Count: 211 10*3/uL (ref 150–400)
RBC: 3.13 MIL/uL — ABNORMAL LOW (ref 4.22–5.81)
RDW: 16.6 % — ABNORMAL HIGH (ref 11.5–15.5)
WBC Count: 6.3 10*3/uL (ref 4.0–10.5)
nRBC: 0 % (ref 0.0–0.2)

## 2020-05-12 LAB — CMP (CANCER CENTER ONLY)
ALT: 22 U/L (ref 0–44)
AST: 20 U/L (ref 15–41)
Albumin: 3.4 g/dL — ABNORMAL LOW (ref 3.5–5.0)
Alkaline Phosphatase: 103 U/L (ref 38–126)
Anion gap: 6 (ref 5–15)
BUN: 10 mg/dL (ref 8–23)
CO2: 26 mmol/L (ref 22–32)
Calcium: 10 mg/dL (ref 8.9–10.3)
Chloride: 104 mmol/L (ref 98–111)
Creatinine: 0.84 mg/dL (ref 0.61–1.24)
GFR, Est AFR Am: >60 mL/min (ref >60)
GFR, Estimated: >60 mL/min (ref >60)
Glucose, Bld: 90 mg/dL (ref 70–99)
Potassium: 3.9 mmol/L (ref 3.5–5.1)
Sodium: 136 mmol/L (ref 135–145)
Total Bilirubin: 0.2 mg/dL — ABNORMAL LOW (ref 0.3–1.2)
Total Protein: 7.6 g/dL (ref 6.5–8.1)

## 2020-05-12 LAB — MAGNESIUM: Magnesium: 1.4 mg/dL — CL (ref 1.7–2.4)

## 2020-05-12 MED ORDER — SODIUM CHLORIDE 0.9 % IV SOLN
2000.0000 mg/m2 | INTRAVENOUS | Status: DC
  Administered 2020-05-12: 4050 mg via INTRAVENOUS
  Filled 2020-05-12: qty 81

## 2020-05-12 MED ORDER — SODIUM CHLORIDE 0.9 % IV SOLN
10.0000 mg | Freq: Once | INTRAVENOUS | Status: AC
  Administered 2020-05-12: 10 mg via INTRAVENOUS
  Filled 2020-05-12: qty 10

## 2020-05-12 MED ORDER — SODIUM CHLORIDE 0.9% FLUSH
10.0000 mL | Freq: Once | INTRAVENOUS | Status: AC
  Administered 2020-05-12: 10 mL
  Filled 2020-05-12: qty 10

## 2020-05-12 MED ORDER — OXALIPLATIN CHEMO INJECTION 100 MG/20ML
85.0000 mg/m2 | Freq: Once | INTRAVENOUS | Status: AC
  Administered 2020-05-12: 170 mg via INTRAVENOUS
  Filled 2020-05-12: qty 34

## 2020-05-12 MED ORDER — LEUCOVORIN CALCIUM INJECTION 350 MG
400.0000 mg/m2 | Freq: Once | INTRAVENOUS | Status: AC
  Administered 2020-05-12: 808 mg via INTRAVENOUS
  Filled 2020-05-12: qty 40.4

## 2020-05-12 MED ORDER — SODIUM CHLORIDE 0.9 % IV SOLN
150.0000 mg | Freq: Once | INTRAVENOUS | Status: AC
  Administered 2020-05-12: 150 mg via INTRAVENOUS
  Filled 2020-05-12: qty 150

## 2020-05-12 MED ORDER — DEXTROSE 5 % IV SOLN
Freq: Once | INTRAVENOUS | Status: AC
  Filled 2020-05-12: qty 250

## 2020-05-12 MED ORDER — DIPHENOXYLATE-ATROPINE 2.5-0.025 MG PO TABS
2.0000 | ORAL_TABLET | Freq: Four times a day (QID) | ORAL | 0 refills | Status: DC | PRN
Start: 2020-05-12 — End: 2020-08-27

## 2020-05-12 MED ORDER — MAGNESIUM SULFATE 4 GM/100ML IV SOLN
4.0000 g | Freq: Once | INTRAVENOUS | Status: AC
  Administered 2020-05-12: 4 g via INTRAVENOUS
  Filled 2020-05-12: qty 100

## 2020-05-12 MED ORDER — SODIUM CHLORIDE 0.9% FLUSH
10.0000 mL | INTRAVENOUS | Status: DC | PRN
  Administered 2020-05-12: 10 mL
  Filled 2020-05-12: qty 10

## 2020-05-12 MED ORDER — PALONOSETRON HCL INJECTION 0.25 MG/5ML
0.2500 mg | Freq: Once | INTRAVENOUS | Status: AC
  Administered 2020-05-12: 0.25 mg via INTRAVENOUS

## 2020-05-12 MED ORDER — SODIUM CHLORIDE 0.9 % IV SOLN
250.0000 mL | Freq: Once | INTRAVENOUS | Status: AC
  Administered 2020-05-12: 250 mL via INTRAVENOUS
  Filled 2020-05-12: qty 250

## 2020-05-12 MED ORDER — PALONOSETRON HCL INJECTION 0.25 MG/5ML
INTRAVENOUS | Status: AC
  Filled 2020-05-12: qty 5

## 2020-05-12 NOTE — Telephone Encounter (Signed)
CRITICAL VALUE STICKER  CRITICAL VALUE: magnesium  RECEIVER (on-site recipient of call): Lenox Ponds LPN  DATE & TIME NOTIFIED: 05/15/20 1320  MESSENGER (representative from lab): [facility]  MD NOTIFIED: Cira Rue NP   TIME OF NOTIFICATION:1333  RESPONSE: Pt receiving 4 mg of IV mag in infusion today

## 2020-05-12 NOTE — Progress Notes (Addendum)
Spoke w/ Regan Rakers, patient to receive Magnesium Sulfate 4 grams IV today with treatment. He will continue his oral magnesium supplementation. Normal saline 250 mL also to be included for extra fluid per Lacie.  Demetrius Charity, PharmD, BCPS, [facility] Pharmacist Pharmacy Phone: [phone] 05/12/2020

## 2020-05-12 NOTE — Patient Instructions (Signed)
[facility] Discharge Instructions for Patients Receiving Chemotherapy  Today you received the following chemotherapy agents Oxaliplatin (ELOXATIN), Leucovorin & Flourouracil (ADRUCIL).  To help prevent nausea and vomiting after your treatment, we encourage you to take your nausea medication  as prescribed.  If you develop nausea and vomiting that is not controlled by your nausea medication, call the clinic.   BELOW ARE SYMPTOMS THAT SHOULD BE REPORTED IMMEDIATELY:  *FEVER GREATER THAN 100.5 F  *CHILLS WITH OR WITHOUT FEVER  NAUSEA AND VOMITING THAT IS NOT CONTROLLED WITH YOUR NAUSEA MEDICATION  *UNUSUAL SHORTNESS OF BREATH  *UNUSUAL BRUISING OR BLEEDING  TENDERNESS IN MOUTH AND THROAT WITH OR WITHOUT PRESENCE OF ULCERS  *URINARY PROBLEMS  *BOWEL PROBLEMS  UNUSUAL RASH Items with * indicate a potential emergency and should be followed up as soon as possible.  Feel free to call the clinic should you have any questions or concerns. The clinic phone number is [phone].  Please show the [facility] at check-in to the Emergency Department and triage nurse.  Magnesium Sulfate injection What is this medicine? MAGNESIUM SULFATE (mag NEE zee um SUL fate) is an electrolyte injection commonly used to treat low magnesium levels in your blood. It is also used to prevent or control seizures in women with preeclampsia or eclampsia. This medicine may be used for other purposes; ask your health care provider or pharmacist if you have questions. What should I tell my health care provider before I take this medicine? They need to know if you have any of these conditions:  heart disease  history of irregular heart beat  kidney disease  an unusual or allergic reaction to magnesium sulfate, medicines, foods, dyes, or preservatives  pregnant or trying to get pregnant  breast-feeding How should I use this medicine? This medicine is for infusion into a vein. It  is given by a health care professional in a hospital or clinic setting. Talk to your pediatrician regarding the use of this medicine in children. While this drug may be prescribed for selected conditions, precautions do apply. Overdosage: If you think you have taken too much of this medicine contact a poison control center or emergency room at once. NOTE: This medicine is only for you. Do not share this medicine with others. What if I miss a dose? This does not apply. What may interact with this medicine? This medicine may interact with the following medications:  certain medicines for anxiety or sleep  certain medicines for seizures like phenobarbital  digoxin  medicines that relax muscles for surgery  narcotic medicines for pain This list may not describe all possible interactions. Give your health care provider a list of all the medicines, herbs, non-prescription drugs, or dietary supplements you use. Also tell them if you smoke, drink alcohol, or use illegal drugs. Some items may interact with your medicine. What should I watch for while using this medicine? Your condition will be monitored carefully while you are receiving this medicine. You may need blood work done while you are receiving this medicine. What side effects may I notice from receiving this medicine? Side effects that you should report to your doctor or health care professional as soon as possible:  allergic reactions like skin rash, itching or hives, swelling of the face, lips, or tongue  facial flushing  muscle weakness  signs and symptoms of low blood pressure like dizziness; feeling faint or lightheaded, falls; unusually weak or tired  signs and symptoms of a dangerous change  in heartbeat or heart rhythm like chest pain; dizziness; fast or irregular heartbeat; palpitations; breathing problems  sweating This list may not describe all possible side effects. Call your doctor for medical advice about side  effects. You may report side effects to FDA at 1-800-FDA-1088. Where should I keep my medicine? This drug is given in a hospital or clinic and will not be stored at home. NOTE: This sheet is a summary. It may not cover all possible information. If you have questions about this medicine, talk to your doctor, pharmacist, or health care provider.  2020 Elsevier/Gold Standard (2016-06-22 12:31:42)

## 2020-05-13 ENCOUNTER — Telehealth: Payer: Self-pay | Admitting: Nurse Practitioner

## 2020-05-13 NOTE — Telephone Encounter (Signed)
Scheduled appt per 5/25 los.  Pt will get an updated appt calendar at their next scheduled appt.

## 2020-05-14 ENCOUNTER — Inpatient Hospital Stay: Payer: 59

## 2020-05-14 ENCOUNTER — Other Ambulatory Visit: Payer: Self-pay

## 2020-05-14 VITALS — BP 115/64 | HR 71 | Temp 98.6°F | Resp 18

## 2020-05-14 DIAGNOSIS — Z5111 Encounter for antineoplastic chemotherapy: Secondary | ICD-10-CM | POA: Diagnosis not present

## 2020-05-14 DIAGNOSIS — C181 Malignant neoplasm of appendix: Secondary | ICD-10-CM

## 2020-05-14 MED ORDER — PEGFILGRASTIM INJECTION 6 MG/0.6ML ~~LOC~~
6.0000 mg | PREFILLED_SYRINGE | Freq: Once | SUBCUTANEOUS | Status: AC
  Administered 2020-05-14: 6 mg via SUBCUTANEOUS

## 2020-05-14 MED ORDER — HEPARIN SOD (PORK) LOCK FLUSH 100 UNIT/ML IV SOLN
500.0000 [IU] | Freq: Once | INTRAVENOUS | Status: DC | PRN
  Filled 2020-05-14: qty 5

## 2020-05-14 MED ORDER — PEGFILGRASTIM INJECTION 6 MG/0.6ML ~~LOC~~
PREFILLED_SYRINGE | SUBCUTANEOUS | Status: AC
  Filled 2020-05-14: qty 0.6

## 2020-05-14 MED ORDER — SODIUM CHLORIDE 0.9% FLUSH
10.0000 mL | INTRAVENOUS | Status: DC | PRN
  Filled 2020-05-14: qty 10

## 2020-05-14 NOTE — Patient Instructions (Signed)

## 2020-05-19 ENCOUNTER — Other Ambulatory Visit: Payer: Self-pay | Admitting: Oncology

## 2020-05-26 MED FILL — Dexamethasone Sodium Phosphate Inj 100 MG/10ML: INTRAMUSCULAR | Qty: 1 | Status: AC

## 2020-05-26 MED FILL — Fosaprepitant Dimeglumine For IV Infusion 150 MG (Base Eq): INTRAVENOUS | Qty: 5 | Status: AC

## 2020-05-27 ENCOUNTER — Inpatient Hospital Stay: Payer: 59 | Admitting: Nutrition

## 2020-05-27 ENCOUNTER — Inpatient Hospital Stay: Payer: 59

## 2020-05-27 ENCOUNTER — Inpatient Hospital Stay: Payer: 59 | Attending: Nurse Practitioner | Admitting: Nurse Practitioner

## 2020-05-27 ENCOUNTER — Other Ambulatory Visit: Payer: Self-pay

## 2020-05-27 ENCOUNTER — Encounter: Payer: Self-pay | Admitting: Nurse Practitioner

## 2020-05-27 VITALS — BP 137/82 | HR 70 | Temp 97.7°F | Resp 18 | Ht 69.0 in | Wt 185.0 lb

## 2020-05-27 DIAGNOSIS — G620 Drug-induced polyneuropathy: Secondary | ICD-10-CM | POA: Diagnosis not present

## 2020-05-27 DIAGNOSIS — C181 Malignant neoplasm of appendix: Secondary | ICD-10-CM

## 2020-05-27 DIAGNOSIS — C786 Secondary malignant neoplasm of retroperitoneum and peritoneum: Secondary | ICD-10-CM | POA: Insufficient documentation

## 2020-05-27 DIAGNOSIS — Z923 Personal history of irradiation: Secondary | ICD-10-CM | POA: Insufficient documentation

## 2020-05-27 DIAGNOSIS — Z79899 Other long term (current) drug therapy: Secondary | ICD-10-CM | POA: Insufficient documentation

## 2020-05-27 DIAGNOSIS — Z8546 Personal history of malignant neoplasm of prostate: Secondary | ICD-10-CM | POA: Diagnosis not present

## 2020-05-27 DIAGNOSIS — Z932 Ileostomy status: Secondary | ICD-10-CM | POA: Diagnosis not present

## 2020-05-27 DIAGNOSIS — Z95828 Presence of other vascular implants and grafts: Secondary | ICD-10-CM

## 2020-05-27 DIAGNOSIS — Z8616 Personal history of COVID-19: Secondary | ICD-10-CM | POA: Insufficient documentation

## 2020-05-27 DIAGNOSIS — R112 Nausea with vomiting, unspecified: Secondary | ICD-10-CM | POA: Diagnosis not present

## 2020-05-27 DIAGNOSIS — R634 Abnormal weight loss: Secondary | ICD-10-CM | POA: Diagnosis not present

## 2020-05-27 DIAGNOSIS — Z5111 Encounter for antineoplastic chemotherapy: Secondary | ICD-10-CM | POA: Insufficient documentation

## 2020-05-27 DIAGNOSIS — T451X5A Adverse effect of antineoplastic and immunosuppressive drugs, initial encounter: Secondary | ICD-10-CM | POA: Diagnosis not present

## 2020-05-27 DIAGNOSIS — C61 Malignant neoplasm of prostate: Secondary | ICD-10-CM | POA: Diagnosis not present

## 2020-05-27 LAB — CBC WITH DIFFERENTIAL (CANCER CENTER ONLY)
Abs Immature Granulocytes: 0.26 10*3/uL — ABNORMAL HIGH (ref 0.00–0.07)
Basophils Absolute: 0.1 10*3/uL (ref 0.0–0.1)
Basophils Relative: 1 %
Eosinophils Absolute: 0.1 10*3/uL (ref 0.0–0.5)
Eosinophils Relative: 0 %
HCT: 31.3 % — ABNORMAL LOW (ref 39.0–52.0)
Hemoglobin: 10 g/dL — ABNORMAL LOW (ref 13.0–17.0)
Immature Granulocytes: 2 %
Lymphocytes Relative: 11 %
Lymphs Abs: 1.3 10*3/uL (ref 0.7–4.0)
MCH: 30.1 pg (ref 26.0–34.0)
MCHC: 31.9 g/dL (ref 30.0–36.0)
MCV: 94.3 fL (ref 80.0–100.0)
Monocytes Absolute: 0.6 10*3/uL (ref 0.1–1.0)
Monocytes Relative: 5 %
Neutro Abs: 9.7 10*3/uL — ABNORMAL HIGH (ref 1.7–7.7)
Neutrophils Relative %: 81 %
Platelet Count: 162 10*3/uL (ref 150–400)
RBC: 3.32 MIL/uL — ABNORMAL LOW (ref 4.22–5.81)
RDW: 17.2 % — ABNORMAL HIGH (ref 11.5–15.5)
WBC Count: 12 10*3/uL — ABNORMAL HIGH (ref 4.0–10.5)
nRBC: 0 % (ref 0.0–0.2)

## 2020-05-27 LAB — CMP (CANCER CENTER ONLY)
ALT: 18 U/L (ref 0–44)
AST: 17 U/L (ref 15–41)
Albumin: 3.4 g/dL — ABNORMAL LOW (ref 3.5–5.0)
Alkaline Phosphatase: 110 U/L (ref 38–126)
Anion gap: 13 (ref 5–15)
BUN: 10 mg/dL (ref 8–23)
CO2: 24 mmol/L (ref 22–32)
Calcium: 10.2 mg/dL (ref 8.9–10.3)
Chloride: 105 mmol/L (ref 98–111)
Creatinine: 0.99 mg/dL (ref 0.61–1.24)
GFR, Est AFR Am: >60 mL/min (ref >60)
GFR, Estimated: >60 mL/min (ref >60)
Glucose, Bld: 127 mg/dL — ABNORMAL HIGH (ref 70–99)
Potassium: 3.7 mmol/L (ref 3.5–5.1)
Sodium: 142 mmol/L (ref 135–145)
Total Bilirubin: 0.4 mg/dL (ref 0.3–1.2)
Total Protein: 7.8 g/dL (ref 6.5–8.1)

## 2020-05-27 LAB — MAGNESIUM: Magnesium: 1.4 mg/dL — CL (ref 1.7–2.4)

## 2020-05-27 MED ORDER — SODIUM CHLORIDE 0.9 % IV SOLN
2000.0000 mg/m2 | INTRAVENOUS | Status: DC
  Administered 2020-05-27: 4050 mg via INTRAVENOUS
  Filled 2020-05-27: qty 81

## 2020-05-27 MED ORDER — SODIUM CHLORIDE 0.9% FLUSH
10.0000 mL | Freq: Once | INTRAVENOUS | Status: AC
  Administered 2020-05-27: 10 mL
  Filled 2020-05-27: qty 10

## 2020-05-27 MED ORDER — SODIUM CHLORIDE 0.9% FLUSH
10.0000 mL | INTRAVENOUS | Status: DC | PRN
  Filled 2020-05-27: qty 10

## 2020-05-27 MED ORDER — LEUCOVORIN CALCIUM INJECTION 350 MG
400.0000 mg/m2 | Freq: Once | INTRAVENOUS | Status: AC
  Administered 2020-05-27: 808 mg via INTRAVENOUS
  Filled 2020-05-27: qty 40.4

## 2020-05-27 MED ORDER — MAGNESIUM SULFATE 4 GM/100ML IV SOLN
4.0000 g | Freq: Once | INTRAVENOUS | Status: AC
  Administered 2020-05-27: 4 g via INTRAVENOUS
  Filled 2020-05-27: qty 100

## 2020-05-27 MED ORDER — SODIUM CHLORIDE 0.9 % IV SOLN
Freq: Once | INTRAVENOUS | Status: AC
  Filled 2020-05-27: qty 250

## 2020-05-27 NOTE — Progress Notes (Signed)
05/27/20  Give 4 gm Magnesium sulfate IVPB today over 2 hours.  T.O.Ned Card, NP/Opha Mcghee Ronnald Ramp, PharmD

## 2020-05-27 NOTE — Patient Instructions (Signed)
[facility] Discharge Instructions for Patients Receiving Chemotherapy  Today you received the following chemotherapy agents: Leucovorin & Flourouracil (ADRUCIL).  To help prevent nausea and vomiting after your treatment, we encourage you to take your nausea medication  as prescribed.  If you develop nausea and vomiting that is not controlled by your nausea medication, call the clinic.   BELOW ARE SYMPTOMS THAT SHOULD BE REPORTED IMMEDIATELY:  *FEVER GREATER THAN 100.5 F  *CHILLS WITH OR WITHOUT FEVER  NAUSEA AND VOMITING THAT IS NOT CONTROLLED WITH YOUR NAUSEA MEDICATION  *UNUSUAL SHORTNESS OF BREATH  *UNUSUAL BRUISING OR BLEEDING  TENDERNESS IN MOUTH AND THROAT WITH OR WITHOUT PRESENCE OF ULCERS  *URINARY PROBLEMS  *BOWEL PROBLEMS  UNUSUAL RASH Items with * indicate a potential emergency and should be followed up as soon as possible.  Feel free to call the clinic should you have any questions or concerns. The clinic phone number is [phone].  Please show the [facility] at check-in to the Emergency Department and triage nurse.  Magnesium Sulfate injection What is this medicine? MAGNESIUM SULFATE (mag NEE zee um SUL fate) is an electrolyte injection commonly used to treat low magnesium levels in your blood. It is also used to prevent or control seizures in women with preeclampsia or eclampsia. This medicine may be used for other purposes; ask your health care provider or pharmacist if you have questions. What should I tell my health care provider before I take this medicine? They need to know if you have any of these conditions:  heart disease  history of irregular heart beat  kidney disease  an unusual or allergic reaction to magnesium sulfate, medicines, foods, dyes, or preservatives  pregnant or trying to get pregnant  breast-feeding How should I use this medicine? This medicine is for infusion into a vein. It is given by a health  care professional in a hospital or clinic setting. Talk to your pediatrician regarding the use of this medicine in children. While this drug may be prescribed for selected conditions, precautions do apply. Overdosage: If you think you have taken too much of this medicine contact a poison control center or emergency room at once. NOTE: This medicine is only for you. Do not share this medicine with others. What if I miss a dose? This does not apply. What may interact with this medicine? This medicine may interact with the following medications:  certain medicines for anxiety or sleep  certain medicines for seizures like phenobarbital  digoxin  medicines that relax muscles for surgery  narcotic medicines for pain This list may not describe all possible interactions. Give your health care provider a list of all the medicines, herbs, non-prescription drugs, or dietary supplements you use. Also tell them if you smoke, drink alcohol, or use illegal drugs. Some items may interact with your medicine. What should I watch for while using this medicine? Your condition will be monitored carefully while you are receiving this medicine. You may need blood work done while you are receiving this medicine. What side effects may I notice from receiving this medicine? Side effects that you should report to your doctor or health care professional as soon as possible:  allergic reactions like skin rash, itching or hives, swelling of the face, lips, or tongue  facial flushing  muscle weakness  signs and symptoms of low blood pressure like dizziness; feeling faint or lightheaded, falls; unusually weak or tired  signs and symptoms of a dangerous change in heartbeat  or heart rhythm like chest pain; dizziness; fast or irregular heartbeat; palpitations; breathing problems  sweating This list may not describe all possible side effects. Call your doctor for medical advice about side effects. You may report side  effects to FDA at 1-800-FDA-1088. Where should I keep my medicine? This drug is given in a hospital or clinic and will not be stored at home. NOTE: This sheet is a summary. It may not cover all possible information. If you have questions about this medicine, talk to your doctor, pharmacist, or health care provider.  2020 Elsevier/Gold Standard (2016-06-22 12:31:42)

## 2020-05-27 NOTE — Progress Notes (Signed)
Juan Richards OFFICE PROGRESS NOTE   Diagnosis: Appendix carcinoma  INTERVAL HISTORY:   Juan Richards returns as scheduled.  He completed cycle 4 FOLFOX 05/12/2020.  He reports no nausea or vomiting for 1 week after the most recent chemotherapy.  Last week he vomited twice.  He continues to have watery output from the ileostomy.  He takes "3 or 4" Imodium a day.  He has not started Lomotil.  He reports persistent numbness in the feet, intermittent cramps.  No numbness or tingling in the hands.  He reports intermittent urinary incontinence since beginning chemotherapy several months ago.  Objective:  Vital signs in last 24 hours:  Blood pressure 137/82, pulse 70, temperature 97.7 F (36.5 C), temperature source Temporal, resp. rate 18, height '5\' 9"'$  (1.753 m), weight 185 lb (83.9 kg), SpO2 100 %.    HEENT: No thrush or ulcers. GI: Abdomen soft and nontender.  No hepatomegaly.  Right lower quadrant ileostomy with liquid stool.  Left abdomen mucous fistula. Vascular: No leg edema. Neuro: Vibratory sense intact over the fingertips per tuning fork exam. Skin: Palms without erythema. Port-A-Cath without erythema.   Lab Results:  Lab Results  Component Value Date   WBC 12.0 (H) 05/27/2020   HGB 10.0 (L) 05/27/2020   HCT 31.3 (L) 05/27/2020   MCV 94.3 05/27/2020   PLT 162 05/27/2020   NEUTROABS 9.7 (H) 05/27/2020    Imaging:  No results found.  Medications: I have reviewed the patient's current medications.  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, 12 tumor deposits, 0/10 lymph nodes positive, negative resection margins, metastatic carcinoma involving  the ileal mesentery noted, low tumor purity with decreased sensitivity for detection of HER-2 copy number and other alterations, microsatellite status and tumor mutation burden could not be determined,MSS, no loss of mismatch repair protein expression -Diffuse subcentimeter nodularity over the mesentery and bowel, sigmoid colon adherent to bladder and left middle pelvic sidewall, gross contaminationwith succus -CTs 03/12/2020-no chest metastases, long segment of sigmoid colon thickening, no discrete peritoneal nodule, nodular anterior bladder surface -Cycle 1 FOLFOX 03/31/2020             -Cycle 2 FOLFOX 04/15/2020, Emend added             -Cycle 3 FOLFOX 04/29/2020, home Decadron prophylaxis added  -Cycle 4 FOLFOX 05/12/2020  -Cycle 5 FOLFOX 05/27/2020  2.Prostate cancer diagnosed in August 2019, robotic assisted prostatectomy 09/21/2018-Gleason 7 acinar tumor,pT3pN1,invasion of seminal vesicles, 3/16 lymph nodes positive, positive right anterior margin positive -Gleason score 4+3 equal 7 and PSA of 60.97. -Staging work-up did not show any evidence of metastatic disease. -Bone scan 08/17/2018-negative for metastatic disease -Elevated PSA July 2019 -PET scanscan 10/15/2019-negative for metastatic disease, evidence of sigmoid diverticulitis with a small contained perforation -Radiation-definitive, 10/29/2019-01/15/2020, interrupted 11/22/2019-12/17/2019 secondary to COVID-19 infection  3. Anemia 4. Deconditioning 5.COVID-19 infection December 2020 6.Orthostatic hypotension 03/19/2020 secondary to high output ileostomy 7.Hypomagnesemia secondary to high-output ileostomy, repleted IV 03/19/2020,03/24/2020,03/31/2020    Disposition: Juan Richards appears stable.  He has completed 4 cycles of FOLFOX.  He has progressive neuropathy symptoms involving the feet.  Oxaliplatin will be  held today.  He will not require white cell growth factor support on the day of pump discontinuation.  We reviewed the CBC from today.  Counts adequate to proceed as above.  He will follow-up with [name] regarding the urinary incontinence.  He will return for lab, follow-up, cycle 5 FOLFOX in 2 weeks.  He will contact the office in the interim with any problems.  Patient seen with Dr. Benay Spice.  Ned Card ANP/GNP-BC   05/27/2020  10:18 AM  This was a shared visit with Ned Card.  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  Oxaliplatin will be held from the chemotherapy regimen today.  We recommended he follow-up with urology to evaluate the incontinence.  Julieanne Manson, MD

## 2020-05-27 NOTE — Progress Notes (Signed)
Nutrition follow-up completed with patient during infusion for appendiceal cancer. Weight slightly improved and documented as 185.5 pounds June 9. Patient reports diarrhea continues.  He states he has not been taking Lomotil as prescribed. Reports some of the diet strategies we discussed have helped with stool thickness. Patient reports he forgets to take certain medications.  Nutrition diagnosis: Food and nutrition related knowledge deficit improved.  Intervention: Enforced importance of patient taking medication as prescribed to control nutrition impact symptoms. Encourage patient to consume increased water to help with large ileostomy output. Educated again on information regarding foods to help with diarrhea. Questions were answered.  Teach back method used.  Patient has contact information.  Monitoring, evaluation, goals: Patient will tolerate oral intake for weight maintenance.  Next visit:To be scheduled in about one month.  **Disclaimer: This note was dictated with voice recognition software. Similar sounding words can inadvertently be transcribed and this note may contain transcription errors which may not have been corrected upon publication of note.**

## 2020-05-27 NOTE — Patient Instructions (Signed)

## 2020-05-29 ENCOUNTER — Other Ambulatory Visit: Payer: Self-pay | Admitting: *Deleted

## 2020-05-29 ENCOUNTER — Telehealth: Payer: Self-pay | Admitting: *Deleted

## 2020-05-29 ENCOUNTER — Inpatient Hospital Stay: Payer: 59

## 2020-05-29 ENCOUNTER — Other Ambulatory Visit: Payer: Self-pay | Admitting: Oncology

## 2020-05-29 ENCOUNTER — Other Ambulatory Visit: Payer: Self-pay

## 2020-05-29 VITALS — BP 128/72 | HR 72 | Temp 98.2°F | Resp 18

## 2020-05-29 DIAGNOSIS — C181 Malignant neoplasm of appendix: Secondary | ICD-10-CM | POA: Diagnosis not present

## 2020-05-29 MED ORDER — HEPARIN SOD (PORK) LOCK FLUSH 100 UNIT/ML IV SOLN
500.0000 [IU] | Freq: Once | INTRAVENOUS | Status: AC | PRN
  Administered 2020-05-29: 500 [IU]
  Filled 2020-05-29: qty 5

## 2020-05-29 MED ORDER — SODIUM CHLORIDE 0.9% FLUSH
10.0000 mL | INTRAVENOUS | Status: DC | PRN
  Administered 2020-05-29: 10 mL
  Filled 2020-05-29: qty 10

## 2020-05-29 MED ORDER — PROCHLORPERAZINE MALEATE 10 MG PO TABS: 10.0000 mg | ORAL_TABLET | Freq: Four times a day (QID) | ORAL | 1 refills | Status: AC | PRN

## 2020-05-29 NOTE — Telephone Encounter (Signed)
Patient left VM saying MD was supposed to send in a script for diarrhea at last visit. Called pharmacy and confirmed he just picked it up on 05/28/20 #30 tablets. Refill request from pharmacy for his pyridium--OK to refill per Dr. Benay Spice. Called patient back and informed him that his diarrhea med was picked up yesterday and we have just approved the pyridium. Call back if he has any further questions or issues.

## 2020-06-06 ENCOUNTER — Other Ambulatory Visit: Payer: Self-pay | Admitting: Oncology

## 2020-06-08 MED FILL — Dexamethasone Sodium Phosphate Inj 100 MG/10ML: INTRAMUSCULAR | Qty: 1 | Status: AC

## 2020-06-08 MED FILL — Fosaprepitant Dimeglumine For IV Infusion 150 MG (Base Eq): INTRAVENOUS | Qty: 5 | Status: AC

## 2020-06-09 ENCOUNTER — Inpatient Hospital Stay: Payer: 59

## 2020-06-09 ENCOUNTER — Other Ambulatory Visit: Payer: Self-pay

## 2020-06-09 ENCOUNTER — Inpatient Hospital Stay (HOSPITAL_BASED_OUTPATIENT_CLINIC_OR_DEPARTMENT_OTHER): Payer: 59 | Admitting: Nurse Practitioner

## 2020-06-09 ENCOUNTER — Encounter: Payer: Self-pay | Admitting: Nurse Practitioner

## 2020-06-09 VITALS — BP 126/91 | HR 79 | Temp 97.9°F | Resp 16 | Ht 69.0 in | Wt 179.2 lb

## 2020-06-09 DIAGNOSIS — C181 Malignant neoplasm of appendix: Secondary | ICD-10-CM

## 2020-06-09 DIAGNOSIS — Z95828 Presence of other vascular implants and grafts: Secondary | ICD-10-CM

## 2020-06-09 LAB — CBC WITH DIFFERENTIAL (CANCER CENTER ONLY)
Abs Immature Granulocytes: 0.02 10*3/uL (ref 0.00–0.07)
Basophils Absolute: 0.1 10*3/uL (ref 0.0–0.1)
Basophils Relative: 1 %
Eosinophils Absolute: 0 10*3/uL (ref 0.0–0.5)
Eosinophils Relative: 1 %
HCT: 28.3 % — ABNORMAL LOW (ref 39.0–52.0)
Hemoglobin: 9.1 g/dL — ABNORMAL LOW (ref 13.0–17.0)
Immature Granulocytes: 0 %
Lymphocytes Relative: 18 %
Lymphs Abs: 1.1 10*3/uL (ref 0.7–4.0)
MCH: 30.4 pg (ref 26.0–34.0)
MCHC: 32.2 g/dL (ref 30.0–36.0)
MCV: 94.6 fL (ref 80.0–100.0)
Monocytes Absolute: 0.6 10*3/uL (ref 0.1–1.0)
Monocytes Relative: 10 %
Neutro Abs: 4.3 10*3/uL (ref 1.7–7.7)
Neutrophils Relative %: 70 %
Platelet Count: 329 10*3/uL (ref 150–400)
RBC: 2.99 MIL/uL — ABNORMAL LOW (ref 4.22–5.81)
RDW: 15.2 % (ref 11.5–15.5)
WBC Count: 6.1 10*3/uL (ref 4.0–10.5)
nRBC: 0 % (ref 0.0–0.2)

## 2020-06-09 LAB — CMP (CANCER CENTER ONLY)
ALT: 26 U/L (ref 0–44)
AST: 20 U/L (ref 15–41)
Albumin: 3.4 g/dL — ABNORMAL LOW (ref 3.5–5.0)
Alkaline Phosphatase: 128 U/L — ABNORMAL HIGH (ref 38–126)
Anion gap: 11 (ref 5–15)
BUN: 14 mg/dL (ref 8–23)
CO2: 20 mmol/L — ABNORMAL LOW (ref 22–32)
Calcium: 10.5 mg/dL — ABNORMAL HIGH (ref 8.9–10.3)
Chloride: 102 mmol/L (ref 98–111)
Creatinine: 1.3 mg/dL — ABNORMAL HIGH (ref 0.61–1.24)
GFR, Est AFR Am: >60 mL/min (ref >60)
GFR, Estimated: 58 mL/min — ABNORMAL LOW (ref >60)
Glucose, Bld: 134 mg/dL — ABNORMAL HIGH (ref 70–99)
Potassium: 3.7 mmol/L (ref 3.5–5.1)
Sodium: 133 mmol/L — ABNORMAL LOW (ref 135–145)
Total Bilirubin: 0.6 mg/dL (ref 0.3–1.2)
Total Protein: 8.3 g/dL — ABNORMAL HIGH (ref 6.5–8.1)

## 2020-06-09 LAB — MAGNESIUM: Magnesium: 1.6 mg/dL — ABNORMAL LOW (ref 1.7–2.4)

## 2020-06-09 MED ORDER — SODIUM CHLORIDE 0.9% FLUSH
10.0000 mL | Freq: Once | INTRAVENOUS | Status: AC
  Administered 2020-06-09: 10 mL
  Filled 2020-06-09: qty 10

## 2020-06-09 MED ORDER — MAGNESIUM SULFATE 2 GM/50ML IV SOLN
INTRAVENOUS | Status: AC
  Filled 2020-06-09: qty 50

## 2020-06-09 MED ORDER — MAGNESIUM SULFATE 2 GM/50ML IV SOLN
2.0000 g | Freq: Once | INTRAVENOUS | Status: AC
  Administered 2020-06-09: 2 g via INTRAVENOUS

## 2020-06-09 MED ORDER — SODIUM CHLORIDE 0.9 % IV SOLN
INTRAVENOUS | Status: DC
  Filled 2020-06-09: qty 250

## 2020-06-09 MED ORDER — HEPARIN SOD (PORK) LOCK FLUSH 100 UNIT/ML IV SOLN
500.0000 [IU] | Freq: Once | INTRAVENOUS | Status: AC
  Administered 2020-06-09: 500 [IU]
  Filled 2020-06-09: qty 5

## 2020-06-09 MED ORDER — SODIUM CHLORIDE 0.9 % IV SOLN
1000.0000 mL | Freq: Once | INTRAVENOUS | Status: AC
  Administered 2020-06-09: 1000 mL via INTRAVENOUS
  Filled 2020-06-09: qty 1000

## 2020-06-09 NOTE — Patient Instructions (Signed)
Hypomagnesemia Hypomagnesemia is a condition in which the level of magnesium in the blood is low. Magnesium is a mineral that is found in many foods. It is used in many different processes in the body. Hypomagnesemia can affect every organ in the body. In severe cases, it can cause life-threatening problems. What are the causes? This condition may be caused by:  Not getting enough magnesium in your diet.  Malnutrition.  Problems with absorbing magnesium from the intestines.  Dehydration.  Alcohol abuse.  Vomiting.  Severe or chronic diarrhea.  Some medicines, including medicines that make you urinate more (diuretics).  Certain diseases, such as kidney disease, diabetes, celiac disease, and overactive thyroid. What are the signs or symptoms? Symptoms of this condition include:  Loss of appetite.  Nausea and vomiting.  Involuntary shaking or trembling of a body part (tremor).  Muscle weakness.  Tingling in the arms and legs.  Sudden tightening of muscles (muscle spasms).  Confusion.  Psychiatric issues, such as depression, irritability, or psychosis.  A feeling of fluttering of the heart.  Seizures. These symptoms are more severe if magnesium levels drop suddenly. How is this diagnosed? This condition may be diagnosed based on:  Your symptoms and medical history.  A physical exam.  Blood and urine tests. How is this treated? Treatment depends on the cause and the severity of the condition. It may be treated with:  A magnesium supplement. This can be taken in pill form. If the condition is severe, magnesium is usually given through an IV.  Changes to your diet. You may be directed to eat foods that have a lot of magnesium, such as green leafy vegetables, peas, beans, and nuts.  Stopping any intake of alcohol. Follow these instructions at home:      Make sure that your diet includes foods with magnesium. Foods that have a lot of magnesium in them  include: ? Green leafy vegetables, such as spinach and broccoli. ? Beans and peas. ? Nuts and seeds, such as almonds and sunflower seeds. ? Whole grains, such as whole grain bread and fortified cereals.  Take magnesium supplements if your health care provider tells you to do that. Take them as directed.  Take over-the-counter and prescription medicines only as told by your health care provider.  Have your magnesium levels monitored as told by your health care provider.  When you are active, drink fluids that contain electrolytes.  Avoid drinking alcohol.  Keep all follow-up visits as told by your health care provider. This is important. Contact a health care provider if:  You get worse instead of better.  Your symptoms return. Get help right away if you:  Develop severe muscle weakness.  Have trouble breathing.  Feel that your heart is racing. Summary  Hypomagnesemia is a condition in which the level of magnesium in the blood is low.  Hypomagnesemia can affect every organ in the body.  Treatment may include eating more foods that contain magnesium, taking magnesium supplements, and not drinking alcohol.  Have your magnesium levels monitored as told by your health care provider. This information is not intended to replace advice given to you by your health care provider. Make sure you discuss any questions you have with your health care provider. Document Revised: 11/17/2017 Document Reviewed: 11/06/2017 Elsevier Patient Education  2020 Elsevier Inc.  

## 2020-06-09 NOTE — Progress Notes (Addendum)
Juan Richards   Diagnosis: Appendix carcinoma  INTERVAL HISTORY:   Juan Richards returns as scheduled.  He completed cycle 5 FOLFOX 05/27/2020.  Oxaliplatin was held due to progressive neuropathy symptoms involving the feet.  He reports the neuropathy symptoms are worse.  About a week ago he began having nausea with intermittent vomiting.  He continues to have watery output from the ileostomy.  He tried Lomotil with initial improvement.  He then discontinued Lomotil.  He is losing weight.  He reports poor fluid intake.  He has been dizzy for the past few days.  Objective:  Vital signs in last 24 hours:  Blood pressure (!) 126/91, pulse 79, temperature 97.9 F (36.6 C), temperature source Temporal, resp. rate 16, height '5\' 9"'$  (1.753 m), weight 179 lb 3.2 oz (81.3 kg), SpO2 100 %.    HEENT: Mild white coating of her tongue.  No buccal thrush. Resp: Lungs clear bilaterally. Cardio: Regular rate and rhythm. GI: Abdomen soft and nontender.  No hepatomegaly.  Right lower quadrant ileostomy with liquid stool.  Left abdomen mucous fistula. Vascular: No leg edema. Port-A-Cath without erythema.  Lab Results:  Lab Results  Component Value Date   WBC 6.1 06/09/2020   HGB 9.1 (L) 06/09/2020   HCT 28.3 (L) 06/09/2020   MCV 94.6 06/09/2020   PLT 329 06/09/2020   NEUTROABS 4.3 06/09/2020    Imaging:  No results found.  Medications: I have reviewed the patient's current medications.  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, 12 tumor deposits, 0/10 lymph nodes positive, negative resection margins, metastatic carcinoma involving the ileal  mesentery noted, low tumor purity with decreased sensitivity for detection of HER-2 copy number and other alterations, microsatellite status and tumor mutation burden could not be determined,MSS, no loss of mismatch repair protein expression -Diffuse subcentimeter nodularity over the mesentery and bowel, sigmoid colon adherent to bladder and left middle pelvic sidewall, gross contaminationwith succus -CTs 03/12/2020-no chest metastases, long segment of sigmoid colon thickening, no discrete peritoneal nodule, nodular anterior bladder surface -Cycle 1 FOLFOX 03/31/2020 -Cycle 2 FOLFOX 04/15/2020, Emend added -Cycle 3 FOLFOX 04/29/2020, home Decadron prophylaxis added             -Cycle 4 FOLFOX 05/12/2020             -Cycle 5 FOLFOX 05/27/2020, oxaliplatin held due to neuropathy symptoms  2.Prostate cancer diagnosed in August 2019, robotic assisted prostatectomy 09/21/2018-Gleason 7 acinar tumor,pT3pN1,invasion of seminal vesicles, 3/16 lymph nodes positive, positive right anterior margin positive -Gleason score 4+3 equal 7 and PSA of 60.97. -Staging work-up did not show any evidence of metastatic disease. -Bone scan 08/17/2018-negative for metastatic disease -Elevated PSA July 2019 -PET scanscan 10/15/2019-negative for metastatic disease, evidence of sigmoid diverticulitis with a small contained perforation -Radiation-definitive, 10/29/2019-01/15/2020, interrupted 11/22/2019-12/17/2019 secondary to COVID-19 infection  3. Anemia 4. Deconditioning 5.COVID-19 infection December 2020 6.Orthostatic hypotension 03/19/2020 secondary to high output ileostomy 7.Hypomagnesemia secondary to high-output ileostomy, repleted IV 03/19/2020,03/24/2020,03/31/2020   Disposition: Juan Richards has completed 5 cycles of FOLFOX.  Oxaliplatin was held with cycle 5 due to  worsening neuropathy symptoms.  He presents today prior to proceeding with cycle 6 FOLFOX.  He is having intermittent nausea/vomiting of unclear etiology, continued watery output from the ileostomy and appears  dehydrated.  He is losing weight.  We are holding today's chemotherapy.  He will receive IV fluids with magnesium.  He is scheduled for restaging CT scans and an appointment with Dr. [name] at [facility] on 06/19/2020.  He will return for a follow-up visit here, possible FOLFOX on 06/23/2020.  He will contact the office in the interim with any problems.  Patient seen with Dr. Benay Spice.    Ned Card ANP/GNP-BC   06/09/2020  10:35 AM This was a shared visit with Ned Card.  Juan Richards has completed 5 cycles of chemotherapy.  He has lost weight compared to 2 weeks ago and is dehydrated today.  He will receive intravenous fluids and magnesium supplementation.  Chemotherapy will be placed on hold.  He is scheduled undergo restaging CTs next week.  Julieanne Manson, MD

## 2020-06-09 NOTE — Progress Notes (Signed)
Holding FOLFOX treatment today per Lattie Haw. Patient to receive 1L normal saline and magnesium 2 grams IV today.   Demetrius Charity, PharmD, BCPS, [facility] Phone: [phone] 06/09/2020

## 2020-06-11 ENCOUNTER — Telehealth: Payer: Self-pay | Admitting: Nurse Practitioner

## 2020-06-11 NOTE — Telephone Encounter (Signed)
Scheduled per 6/22 los and 6/24 staff msg. Called and left a voicemail. Mailing appt letter and calendar printout

## 2020-06-19 ENCOUNTER — Encounter (HOSPITAL_COMMUNITY): Payer: Self-pay | Admitting: Family Medicine

## 2020-06-19 ENCOUNTER — Inpatient Hospital Stay: Payer: 59 | Attending: Nurse Practitioner | Admitting: Oncology

## 2020-06-19 ENCOUNTER — Other Ambulatory Visit: Payer: Self-pay

## 2020-06-19 ENCOUNTER — Ambulatory Visit
Admission: RE | Admit: 2020-06-19 | Discharge: 2020-06-19 | Disposition: A | Discharge status: Home / Self Care | Payer: Self-pay | Source: Ambulatory Visit | Attending: Oncology | Admitting: Oncology

## 2020-06-19 ENCOUNTER — Other Ambulatory Visit (HOSPITAL_COMMUNITY): Payer: Self-pay | Admitting: Oncology

## 2020-06-19 ENCOUNTER — Encounter: Payer: Self-pay | Admitting: *Deleted

## 2020-06-19 ENCOUNTER — Inpatient Hospital Stay (HOSPITAL_COMMUNITY)
Admission: AD | Admit: 2020-06-19 | Discharge: 2020-06-23 | DRG: 372 | Disposition: A | Discharge status: Home / Self Care | Payer: 59 | Attending: Internal Medicine | Admitting: Internal Medicine

## 2020-06-19 ENCOUNTER — Telehealth: Payer: Self-pay | Admitting: *Deleted

## 2020-06-19 VITALS — BP 131/79 | HR 90 | Temp 98.1°F | Resp 18 | Ht 69.0 in | Wt 177.6 lb

## 2020-06-19 DIAGNOSIS — Z79891 Long term (current) use of opiate analgesic: Secondary | ICD-10-CM | POA: Diagnosis not present

## 2020-06-19 DIAGNOSIS — Z79899 Other long term (current) drug therapy: Secondary | ICD-10-CM

## 2020-06-19 DIAGNOSIS — K651 Peritoneal abscess: Secondary | ICD-10-CM | POA: Diagnosis present

## 2020-06-19 DIAGNOSIS — B370 Candidal stomatitis: Secondary | ICD-10-CM | POA: Diagnosis present

## 2020-06-19 DIAGNOSIS — D6481 Anemia due to antineoplastic chemotherapy: Secondary | ICD-10-CM | POA: Diagnosis present

## 2020-06-19 DIAGNOSIS — G629 Polyneuropathy, unspecified: Secondary | ICD-10-CM | POA: Diagnosis present

## 2020-06-19 DIAGNOSIS — C181 Malignant neoplasm of appendix: Secondary | ICD-10-CM | POA: Diagnosis not present

## 2020-06-19 DIAGNOSIS — T451X5A Adverse effect of antineoplastic and immunosuppressive drugs, initial encounter: Secondary | ICD-10-CM | POA: Diagnosis present

## 2020-06-19 DIAGNOSIS — Z9079 Acquired absence of other genital organ(s): Secondary | ICD-10-CM

## 2020-06-19 DIAGNOSIS — Z833 Family history of diabetes mellitus: Secondary | ICD-10-CM

## 2020-06-19 DIAGNOSIS — Z9049 Acquired absence of other specified parts of digestive tract: Secondary | ICD-10-CM | POA: Diagnosis not present

## 2020-06-19 DIAGNOSIS — Z20822 Contact with and (suspected) exposure to covid-19: Secondary | ICD-10-CM | POA: Diagnosis present

## 2020-06-19 DIAGNOSIS — C61 Malignant neoplasm of prostate: Secondary | ICD-10-CM

## 2020-06-19 DIAGNOSIS — Z841 Family history of disorders of kidney and ureter: Secondary | ICD-10-CM

## 2020-06-19 DIAGNOSIS — Z85038 Personal history of other malignant neoplasm of large intestine: Secondary | ICD-10-CM

## 2020-06-19 DIAGNOSIS — Z800 Family history of malignant neoplasm of digestive organs: Secondary | ICD-10-CM | POA: Diagnosis not present

## 2020-06-19 DIAGNOSIS — Z8546 Personal history of malignant neoplasm of prostate: Secondary | ICD-10-CM

## 2020-06-19 DIAGNOSIS — C786 Secondary malignant neoplasm of retroperitoneum and peritoneum: Secondary | ICD-10-CM | POA: Diagnosis present

## 2020-06-19 DIAGNOSIS — D638 Anemia in other chronic diseases classified elsewhere: Secondary | ICD-10-CM | POA: Diagnosis present

## 2020-06-19 DIAGNOSIS — C762 Malignant neoplasm of abdomen: Secondary | ICD-10-CM | POA: Diagnosis present

## 2020-06-19 LAB — SARS CORONAVIRUS 2 BY RT PCR (HOSPITAL ORDER, PERFORMED IN ~~LOC~~ HOSPITAL LAB): SARS Coronavirus 2: NEGATIVE

## 2020-06-19 MED ORDER — FLUCONAZOLE 100 MG PO TABS
200.0000 mg | ORAL_TABLET | Freq: Every day | ORAL | Status: DC
  Administered 2020-06-19 – 2020-06-22 (×4): 200 mg via ORAL
  Filled 2020-06-19 (×5): qty 2

## 2020-06-19 MED ORDER — ACETAMINOPHEN 650 MG RE SUPP: 650.0000 mg | Freq: Four times a day (QID) | RECTAL | Status: DC | PRN

## 2020-06-19 MED ORDER — ACETAMINOPHEN 325 MG PO TABS
650.0000 mg | ORAL_TABLET | Freq: Four times a day (QID) | ORAL | Status: DC | PRN
  Administered 2020-06-20: 650 mg via ORAL
  Filled 2020-06-19: qty 2

## 2020-06-19 MED ORDER — PANTOPRAZOLE SODIUM 40 MG PO TBEC
40.0000 mg | DELAYED_RELEASE_TABLET | Freq: Every day | ORAL | Status: DC
  Administered 2020-06-20 – 2020-06-22 (×3): 40 mg via ORAL
  Filled 2020-06-19 (×4): qty 1

## 2020-06-19 MED ORDER — ONDANSETRON HCL 4 MG/2ML IJ SOLN
4.0000 mg | Freq: Four times a day (QID) | INTRAMUSCULAR | Status: DC | PRN
  Administered 2020-06-21: 4 mg via INTRAVENOUS
  Filled 2020-06-19: qty 2

## 2020-06-19 MED ORDER — ONDANSETRON HCL 4 MG PO TABS
4.0000 mg | ORAL_TABLET | Freq: Four times a day (QID) | ORAL | Status: DC | PRN
  Administered 2020-06-19: 4 mg via ORAL
  Filled 2020-06-19: qty 1

## 2020-06-19 MED ORDER — LORAZEPAM 2 MG/ML IJ SOLN
1.0000 mg | Freq: Four times a day (QID) | INTRAMUSCULAR | Status: DC | PRN
  Administered 2020-06-19: 1 mg via INTRAVENOUS
  Filled 2020-06-19: qty 1

## 2020-06-19 MED ORDER — SODIUM CHLORIDE 0.9 % IV SOLN: INTRAVENOUS | Status: AC

## 2020-06-19 MED ORDER — OXYCODONE HCL 5 MG PO TABS
5.0000 mg | ORAL_TABLET | ORAL | Status: DC | PRN
  Administered 2020-06-19 – 2020-06-22 (×10): 5 mg via ORAL
  Filled 2020-06-19 (×13): qty 1

## 2020-06-19 MED FILL — Fosaprepitant Dimeglumine For IV Infusion 150 MG (Base Eq): INTRAVENOUS | Qty: 5 | Status: AC

## 2020-06-19 MED FILL — Dexamethasone Sodium Phosphate Inj 100 MG/10ML: INTRAMUSCULAR | Qty: 1 | Status: AC

## 2020-06-19 NOTE — Telephone Encounter (Addendum)
Left VM asking if "the procedure" would be done today? Still has his port accessed, so will need to have needle pulled if not. Per Dr. Benay Spice: IR will not drain abscess as outpatient. Needs admission. Patient notified and on his way to office to see MD and be directly admitted.

## 2020-06-19 NOTE — Progress Notes (Signed)
Juan OFFICE PROGRESS NOTE   Diagnosis: Appendix carcinoma  INTERVAL HISTORY:   Juan Richards returns prior to scheduled visit.  He saw Dr. [name] earlier today.  A CT abdomen[facility]pelvis at [facility] revealed a peripherally enhancing fluid collection at the left iliac is muscle concerning for an abscess.  Omental and left paracolic gutter nodularity was noted.  There is asymmetric thickening of the bladder wall. Juan Richards reports intermittent fever.  He has pain in the left inguinal region.  He reports numbness and weakness of the left leg.  This has progressed.  No hand numbness.  He continues to have urinary incontinence. Juan Richards has cold sensitivity in the mouth.  Objective:  Vital signs in last 24 hours:  Blood pressure 131/79, pulse 90, temperature 98.1 F (36.7 C), temperature source Temporal, resp. rate 18, height _0  (1.753 m), weight 177 lb 9.6 oz (80.6 kg), SpO2 100 %.    HEENT: Thrush over the tongue, no ulcers Resp: Lungs clear bilaterally Cardio: Regular rate and rhythm GI: No hepatomegaly, right abdomen ileostomy with liquid stool, left abdomen mucous fistula, firm in the left pelvis with tenderness Vascular: No leg edema Neuro: Weakness straight leg raise on the left, sensation intact to light touch at the feet Skin: Palms without erythema  Portacath/PICC-without erythema  Lab Results:  Lab Results  Component Value Date   WBC 6.1 06/09/2020   HGB 9.1 (L) 06/09/2020   HCT 28.3 (L) 06/09/2020   MCV 94.6 06/09/2020   PLT 329 06/09/2020   NEUTROABS 4.3 06/09/2020    CMP  Lab Results  Component Value Date   NA 133 (L) 06/09/2020   K 3.7 06/09/2020   CL 102 06/09/2020   CO2 20 (L) 06/09/2020   GLUCOSE 134 (H) 06/09/2020   BUN 14 06/09/2020   CREATININE 1.30 (H) 06/09/2020   CALCIUM 10.5 (H) 06/09/2020   PROT 8.3 (H) 06/09/2020   ALBUMIN 3.4 (L) 06/09/2020   AST 20 06/09/2020   ALT 26 06/09/2020   ALKPHOS 128 (H) 06/09/2020    BILITOT 0.6 06/09/2020   GFRNONAA 58 (L) 06/09/2020   GFRAA >60 06/09/2020    Lab Results  Component Value Date   CEA1 1.53 03/02/2020    Imaging:  CT OUTSIDE FILMS BODY/ABD/PELVIS  Result Date: 06/19/2020 This examination belongs to an outside facility and is stored here for comparison purposes only.  Contact the originating outside institution for any associated report or interpretation.   Medications: I have reviewed the patient's current medications.   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, 12 tumor deposits, 0/10 lymph nodes positive, negative resection margins, metastatic carcinoma involving the ileal mesentery noted, low tumor purity with decreased sensitivity for detection of HER-2 copy number and other alterations, microsatellite status and tumor mutation burden could not be determined,MSS, no loss of mismatch repair protein expression -Diffuse subcentimeter nodularity over the mesentery and bowel, sigmoid colon adherent to bladder and left middle pelvic sidewall, gross contaminationwith succus -CTs 03/12/2020-no chest metastases, long segment of sigmoid colon thickening, no discrete peritoneal nodule, nodular anterior bladder surface -Cycle 1 FOLFOX 03/31/2020 -Cycle 2 FOLFOX 04/15/2020, Emend added -Cycle 3 FOLFOX 04/29/2020, home Decadron prophylaxis added             -Cycle 4 FOLFOX 05/12/2020             -  Cycle 5 FOLFOX 05/27/2020, oxaliplatin held due to neuropathy symptoms             -[facility] at [facility] 06/19/2020-mild right hydronephrosis due to compression of the ureter in the region of  the sigmoid colon, peripheral enhancing fluid collection at the left iliac is muscle adjacent to the sigmoid colon.  Omental haziness and nodularity lower abdomen and around the descending colon in the left paracolic gutter, soft tissue stranding around the sigmoid colon extends to the superior bladder dome, asymmetric thickening of the bladder wall  2.Prostate cancer diagnosed in August 2019, robotic assisted prostatectomy 09/21/2018-Gleason 7 acinar tumor,pT3pN1,invasion of seminal vesicles, 3/16 lymph nodes positive, positive right anterior margin positive -Gleason score 4+3 equal 7 and PSA of 60.97. -Staging work-up did not show any evidence of metastatic disease. -Bone scan 08/17/2018-negative for metastatic disease -Elevated PSA July 2019 -PET scanscan 10/15/2019-negative for metastatic disease, evidence of sigmoid diverticulitis with a small contained perforation -Radiation-definitive, 10/29/2019-01/15/2020, interrupted 11/22/2019-12/17/2019 secondary to COVID-19 infection  3. Anemia 4. Deconditioning 5.COVID-19 infection December 2020 6.Orthostatic hypotension 03/19/2020 secondary to high output ileostomy 7.Hypomagnesemia secondary to high-output ileostomy, repleted IV 03/19/2020,03/24/2020,03/31/2020 8.  Oral candidiasis 06/19/2020 9.  Left leg weakness and numbness-nerve compression related to the left pelvic inflammatory process?     Disposition: Juan Richards has metastatic appendiceal carcinoma.  He has completed 5 cycles of FOLFOX chemotherapy.  He underwent restaging CTs at [facility] earlier today in anticipation of undergoing cytoreductive surgery.  The CT confirms an enhancing fluid collection in the left pelvis.  Juan Richards reports fever and left pelvic pain.  The clinical presentation is consistent with a pelvic abscess.  Juan Richards agrees to hospital admission.  We will consult interventional  radiology to drain the presumed left pelvic abscess.  Juan Richards has oral candidiasis.  He will be treated with Diflucan.  He has progressive left leg weakness and numbness, potentially related to the pelvic inflammatory process.  We can monitor for improvement with treatment of the abscess.  This could also be related to peripheral nerve compression secondary to immobility and weight loss with the cancer diagnosis.  Juan Richards will be admitted to the medical service today.  Oncology will check on him while in the hospital. Betsy Coder, MD  06/19/2020  4:05 PM

## 2020-06-19 NOTE — H&P (Signed)
History and Physical    Juan Richards LNL:[id number] DOB: [birth year] DOA: 06/19/2020  PCP: [name], MD   Patient coming from: Home   Chief Complaint: Left groin pain and chills  HPI: Juan Richards is a 64 y.o. 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  Patient has noted recent chills and aching pain in his left groin, as well as numbness and subjective weakness in the left leg.  He underwent CT scan for restaging today and was noted to have an enhancing fluid collection in the left abscess.  He was evaluated by his oncologist for this in the clinic, also noted to have oral candidiasis, and was directed to the hospital for drainage by IR.  Patient denies any chest pain, cough, or shortness of breath.    Review of Systems:  All other systems reviewed and apart from HPI, are negative.  Past Medical History:  Diagnosis Date  . 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 DIVERTICULITIS, COLON, WITH PERFORATION 07/23/2009  . Numbness and tingling    RIGHT TOES AND LEFT LEG DUE TO L 5 DISC   . PLEURISY YRS AGO  . Prostate cancer [facility]) 2019   prostatectomy  . 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 BACK SURGERY  2017   L4 BACK SURGERY  . COLON SURGERY Right 01/26/2020   hemicolectomy, ileostomy  . COLONOSCOPY    . LAPAROTOMY N/A 01/26/2020   Procedure: EXPLORATORY LAPAROTOMY  WITH RIGHT HEMICOLECTOMY,  END ILEOSTOMY, AND MUCUS FISTULA.;  Surgeon: [name], MD;  Location: [place];   Service: General;  Laterality: N/A;  . PELVIC LYMPH NODE DISSECTION Bilateral 09/21/2018   Procedure: BILATERAL PELVIC LYMPH NODE DISSECTION;  Surgeon: Ardis Hughs, MD;  Location: [facility];  Service: Urology;  Laterality: Bilateral;  . POLYPECTOMY    . PORTACATH PLACEMENT Left 03/10/2020   Procedure: PORT PLACEMENT;  Surgeon: [name], MD;  Location: [place];  Service: General;  Laterality: Left;  . ROBOT ASSISTED LAPAROSCOPIC RADICAL PROSTATECTOMY N/A 09/21/2018   Procedure: XI ROBOTIC ASSISTED LAPAROSCOPIC RADICAL PROSTATECTOMY;  Surgeon: Ardis Hughs, MD;  Location: [facility];  Service: Urology;  Laterality: N/A;     reports that he has never smoked. He has never used smokeless tobacco. He reports current alcohol use of about 2.0 - 3.0 standard drinks of alcohol per week. He reports that he does not use drugs.  No Known Allergies  Family History  Problem Relation Age of Onset  . Diabetes Mother   . Kidney disease Mother   . Cancer Father        ?  . Diabetes Maternal Grandmother   . Colon cancer Brother   . Esophageal cancer Neg Hx   . Rectal cancer Neg Hx   . Stomach cancer Neg Hx      Prior to Admission medications   Medication Sig Start Date End Date Taking? Authorizing Provider  acetaminophen (TYLENOL) 500 MG tablet You can take 1000 mg of Tylenol/acetaminophen every 6 hours as needed for pain.  You  can buy this over-the-counter at any drugstore.  Do not take more than 4000 mg of Tylenol/acetaminophen per day, it can harm your liver. 02/07/20  Yes Earnstine Regal, PA-C  dexamethasone (DECADRON) 4 MG tablet Take 1 tablet (4 mg total) by mouth as directed. Take for 2 days after each chemo beginning on day 2 04/15/20  Yes Ladell Pier, MD  diphenoxylate-atropine (LOMOTIL) 2.5-0.025 MG tablet Take 2 tablets by mouth 4 (four) times daily as needed for diarrhea or loose stools. 05/12/20  Yes Alla Feeling, NP  lidocaine-prilocaine (EMLA) cream  Apply 1 application topically as needed. 03/24/20  Yes Owens Shark, NP  magnesium oxide (MAG-OX) 400 MG tablet Take 400 mg by mouth daily. Take 1 tablet 2 times daily with food.    Yes [provider]  oxyCODONE (OXY IR/ROXICODONE) 5 MG immediate release tablet Take 1 tablet (5 mg total) by mouth every 4 (four) hours as needed for breakthrough pain (Pain not relieved by plain Tylenol). 03/10/20  Yes [name], MD  pantoprazole (PROTONIX) 40 MG tablet Take 1 tablet (40 mg total) by mouth daily. 02/08/20  Yes Vashti Hey, MD  phenazopyridine (PYRIDIUM) 200 MG tablet TAKE 1 TABLET BY MOUTH THREE TIMES DAILY AS NEEDED Patient taking differently: Take 200 mg by mouth 3 (three) times daily as needed for pain.  05/29/20  Yes Ladell Pier, MD  prochlorperazine (COMPAZINE) 10 MG tablet Take 1 tablet (10 mg total) by mouth every 6 (six) hours as needed for nausea. 05/29/20  Yes Ladell Pier, MD  loperamide (IMODIUM) 2 MG capsule Take 2 mg by mouth in the morning, at noon, in the evening, and at bedtime. Patient not taking: Reported on 06/19/2020    [provider]  ondansetron (ZOFRAN-ODT) 8 MG disintegrating tablet Take 8 mg by mouth every 8 (eight) hours as needed for nausea or vomiting. Patient not taking: Reported on 06/19/2020    [provider]    Physical Exam: Vitals:   06/19/20 1715  BP: 132/70  Pulse: 90  Resp: 17  Temp: 99.5 F (37.5 C)  TempSrc: Oral  SpO2: 100%    Constitutional: NAD, calm  Eyes: PERTLA, lids and conjunctivae normal ENMT: Mucous membranes are moist. Posterior pharynx clear of any exudate or lesions.   Neck: normal, supple, no masses, no thyromegaly Respiratory:  no wheezing, no crackles. No accessory muscle use.  Cardiovascular: S1 & S2 heard, regular rate and rhythm. No extremity edema.   Abdomen: No distension, no tenderness, soft. Bowel sounds active.  Musculoskeletal: no clubbing / cyanosis. No joint deformity upper  and lower extremities.   Skin: no significant rashes, lesions, ulcers. Warm, dry, well-perfused. Neurologic: CN 2-12 grossly intact. Sensation to light touch diminished in distal LEs. Strength 5/5 in all 4 limbs.  Psychiatric: Alert and oriented to person, place, and situation. Calm and cooperative.    Labs and Imaging on Admission: I have personally reviewed following labs and imaging studies  CBC: No results for input(s): WBC, NEUTROABS, HGB, HCT, MCV, PLT in the last 168 hours. Basic Metabolic Panel: No results for input(s): NA, K, CL, CO2, GLUCOSE, BUN, CREATININE, CALCIUM, MG, PHOS in the last 168 hours. GFR: Estimated Creatinine Clearance: 57.4 mL/min (A) (by C-G formula based on SCr of 1.3 mg/dL (H)). Liver Function Tests: No results for input(s): AST, ALT, ALKPHOS, BILITOT, PROT, ALBUMIN in the last 168 hours. No results for input(s): LIPASE, AMYLASE in the last 168 hours. No results for input(s): AMMONIA  in the last 168 hours. Coagulation Profile: No results for input(s): INR, PROTIME in the last 168 hours. Cardiac Enzymes: No results for input(s): CKTOTAL, CKMB, CKMBINDEX, TROPONINI in the last 168 hours. BNP (last 3 results) No results for input(s): PROBNP in the last 8760 hours. HbA1C: No results for input(s): HGBA1C in the last 72 hours. CBG: No results for input(s): GLUCAP in the last 168 hours. Lipid Profile: No results for input(s): CHOL, HDL, LDLCALC, TRIG, CHOLHDL, LDLDIRECT in the last 72 hours. Thyroid Function Tests: No results for input(s): TSH, T4TOTAL, FREET4, T3FREE, THYROIDAB in the last 72 hours. Anemia Panel: No results for input(s): VITAMINB12, FOLATE, FERRITIN, TIBC, IRON, RETICCTPCT in the last 72 hours. Urine analysis:    Component Value Date/Time   COLORURINE YELLOW 04/29/2020 1252   APPEARANCEUR HAZY (A) 04/29/2020 1252   LABSPEC 1.023 04/29/2020 1252   PHURINE 5.0 04/29/2020 1252   GLUCOSEU NEGATIVE 04/29/2020 1252   GLUCOSEU NEGATIVE  07/22/2009 1428   HGBUR NEGATIVE 04/29/2020 1252   BILIRUBINUR NEGATIVE 04/29/2020 1252   KETONESUR 5 (A) 04/29/2020 1252   PROTEINUR 30 (A) 04/29/2020 1252   UROBILINOGEN 2.0 (H) 07/24/2009 0304   NITRITE NEGATIVE 04/29/2020 1252   LEUKOCYTESUR NEGATIVE 04/29/2020 1252   Sepsis Labs: @LABRCNTIP (procalcitonin:4,lacticidven:4) )No results found for this or any previous visit (from the past 240 hour(s)).   Radiological Exams on Admission: CT OUTSIDE FILMS BODY/ABD/PELVIS  Result Date: 06/19/2020 This examination belongs to an outside facility and is stored here for comparison purposes only.  Contact the originating outside institution for any associated report or interpretation.   Assessment/Plan   1. Left pelvic abscess  - Patient reports recent left groin pain, occasional chills, and LLE numbness and weakness, and was found to have a peripherally enhancing fluid-collection in left pelvis suspicious for abscess  - IR has been asked to evaluate in am for drainage  - Patient does not appear toxic, is afebrile and hemodynamically stable, and antibiotics will be held for now pending gram stain and culture of fluid collection   2. Oral candidiasis  - Diflucan   3. Appendiceal carcinoma  - Underwent resection in Feb 2021 and FOLFOX from April until June 2021 and stopped due to neuropathy  - Staging CT at [facility] 06/19/20 with pelvic fluid-collection as above    4. Left leg numbness and weakness  - Suspected secondary to pelvic fluid collection and will be reassessed after drainage of that     DVT prophylaxis: SCDs  Code Status: Full  Family Communication: Discussed with patient  Disposition Plan:  Patient is from: Home  Anticipated d/c is to: Home  Anticipated d/c date is: 06/21/20 Patient currently: Pending IR consultation for drainage of pelvic fluid collection  Consults called: None Admission status: Inpatient     Vianne Bulls, MD [facility] Pager: See  [email or web address]  If 7AM-7PM, please contact the daytime attending [email or web address]  06/19/2020, 8:50 PM

## 2020-06-19 NOTE — Progress Notes (Signed)
Transferred from MD office to #[id number] on [address] via w/c in good condition. Report given to Caryl Pina, RN on floor and wife notified of his location.

## 2020-06-20 ENCOUNTER — Inpatient Hospital Stay (HOSPITAL_COMMUNITY): Payer: 59

## 2020-06-20 ENCOUNTER — Encounter (HOSPITAL_COMMUNITY): Payer: Self-pay | Admitting: Family Medicine

## 2020-06-20 LAB — PROTIME-INR
INR: 1.2 (ref 0.8–1.2)
Prothrombin Time: 14.3 seconds (ref 11.4–15.2)

## 2020-06-20 LAB — BASIC METABOLIC PANEL
Anion gap: 10 (ref 5–15)
BUN: 20 mg/dL (ref 8–23)
CO2: 23 mmol/L (ref 22–32)
Calcium: 10 mg/dL (ref 8.9–10.3)
Chloride: 98 mmol/L (ref 98–111)
Creatinine, Ser: 0.99 mg/dL (ref 0.61–1.24)
GFR calc Af Amer: >60 mL/min (ref >60)
GFR calc non Af Amer: >60 mL/min (ref >60)
Glucose, Bld: 100 mg/dL — ABNORMAL HIGH (ref 70–99)
Potassium: 4.4 mmol/L (ref 3.5–5.1)
Sodium: 131 mmol/L — ABNORMAL LOW (ref 135–145)

## 2020-06-20 LAB — CBC WITH DIFFERENTIAL/PLATELET
Abs Immature Granulocytes: 0.05 10*3/uL (ref 0.00–0.07)
Basophils Absolute: 0.1 10*3/uL (ref 0.0–0.1)
Basophils Relative: 1 %
Eosinophils Absolute: 0 10*3/uL (ref 0.0–0.5)
Eosinophils Relative: 1 %
HCT: 25 % — ABNORMAL LOW (ref 39.0–52.0)
Hemoglobin: 8 g/dL — ABNORMAL LOW (ref 13.0–17.0)
Immature Granulocytes: 1 %
Lymphocytes Relative: 7 %
Lymphs Abs: 0.6 10*3/uL — ABNORMAL LOW (ref 0.7–4.0)
MCH: 30.8 pg (ref 26.0–34.0)
MCHC: 32 g/dL (ref 30.0–36.0)
MCV: 96.2 fL (ref 80.0–100.0)
Monocytes Absolute: 1 10*3/uL (ref 0.1–1.0)
Monocytes Relative: 12 %
Neutro Abs: 6.7 10*3/uL (ref 1.7–7.7)
Neutrophils Relative %: 78 %
Platelets: 388 10*3/uL (ref 150–400)
RBC: 2.6 MIL/uL — ABNORMAL LOW (ref 4.22–5.81)
RDW: 14.4 % (ref 11.5–15.5)
WBC: 8.4 10*3/uL (ref 4.0–10.5)
nRBC: 0 % (ref 0.0–0.2)

## 2020-06-20 LAB — HEPATIC FUNCTION PANEL
ALT: 59 U/L — ABNORMAL HIGH (ref 0–44)
AST: 30 U/L (ref 15–41)
Albumin: 3 g/dL — ABNORMAL LOW (ref 3.5–5.0)
Alkaline Phosphatase: 155 U/L — ABNORMAL HIGH (ref 38–126)
Bilirubin, Direct: 0.1 mg/dL (ref 0.0–0.2)
Indirect Bilirubin: 0.6 mg/dL (ref 0.3–0.9)
Total Bilirubin: 0.7 mg/dL (ref 0.3–1.2)
Total Protein: 8 g/dL (ref 6.5–8.1)

## 2020-06-20 LAB — MAGNESIUM: Magnesium: 1.7 mg/dL (ref 1.7–2.4)

## 2020-06-20 MED ORDER — FENTANYL CITRATE (PF) 100 MCG/2ML IJ SOLN
INTRAMUSCULAR | Status: AC
  Filled 2020-06-20: qty 4

## 2020-06-20 MED ORDER — FENTANYL CITRATE (PF) 100 MCG/2ML IJ SOLN
INTRAMUSCULAR | Status: AC | PRN
  Administered 2020-06-20: 50 ug via INTRAVENOUS
  Administered 2020-06-20: 25 ug via INTRAVENOUS

## 2020-06-20 MED ORDER — MIDAZOLAM HCL 2 MG/2ML IJ SOLN
INTRAMUSCULAR | Status: AC
  Filled 2020-06-20: qty 4

## 2020-06-20 MED ORDER — MIDAZOLAM HCL 2 MG/2ML IJ SOLN
INTRAMUSCULAR | Status: AC | PRN
  Administered 2020-06-20 (×2): 1 mg via INTRAVENOUS

## 2020-06-20 MED ORDER — VANCOMYCIN HCL IN DEXTROSE 1-5 GM/200ML-% IV SOLN
1000.0000 mg | Freq: Two times a day (BID) | INTRAVENOUS | Status: DC
  Administered 2020-06-20 – 2020-06-23 (×6): 1000 mg via INTRAVENOUS
  Filled 2020-06-20 (×6): qty 200

## 2020-06-20 MED ORDER — SODIUM CHLORIDE 0.9% FLUSH
5.0000 mL | Freq: Three times a day (TID) | INTRAVENOUS | Status: DC
  Administered 2020-06-20 – 2020-06-22 (×5): 5 mL

## 2020-06-20 MED ORDER — LIDOCAINE HCL 1 % IJ SOLN
INTRAMUSCULAR | Status: AC | PRN
  Administered 2020-06-20: 10 mL

## 2020-06-20 MED ORDER — CHLORHEXIDINE GLUCONATE CLOTH 2 % EX PADS
6.0000 | MEDICATED_PAD | Freq: Every day | CUTANEOUS | Status: DC
  Administered 2020-06-21 – 2020-06-23 (×3): 6 via TOPICAL

## 2020-06-20 MED ORDER — SODIUM CHLORIDE 0.9 % IV SOLN
2.0000 g | Freq: Three times a day (TID) | INTRAVENOUS | Status: DC
  Administered 2020-06-20 – 2020-06-23 (×9): 2 g via INTRAVENOUS
  Filled 2020-06-20 (×9): qty 2

## 2020-06-20 NOTE — Progress Notes (Signed)
PROGRESS NOTE    Juan Richards  ESP:[id number] DOB: 1956-03-17 DOA: 06/19/2020 PCP: [name], MD   Brief Narrative:  Juan Richards is a 64 y.o. 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  Patient has noted recent chills and aching pain in his left groin, as well as numbness and subjective weakness in the left leg.  He underwent CT scan for restaging today and was noted to have an enhancing fluid collection in the left pelvis.  He was evaluated by his oncologist for this in the clinic, also noted to have oral candidiasis, and was directed to the hospital for drainage by IR.    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[place])   Oral candidiasis  Left pelvic fluid collection consistent with abscess, patient does not meet sepsis criteria, POA - Patient reports recent left groin pain, occasional chills, and LLE numbness and weakness, and was found to have a peripherally enhancing fluid-collection in left pelvis suspicious for abscess  - IR consulted -able to drain frank pus from the region early this morning - Initiate vancomycin and cefepime -Follow cultures from fluid aspirate, de-escalate as possible  Oral candidiasis  - Diflucan   Appendiceal carcinoma  - Underwent resection in Feb 2021 and FOLFOX from April until June 2021 and stopped due to neuropathy  - Staging CT at [facility] 06/19/20 with pelvic fluid-collection as above    Left leg  numbness and weakness  - Suspected secondary to pelvic fluid collection and will be reassessed after drainage of that     DVT prophylaxis: SCDs  Code Status: Full  Family Communication: None present  Status is: Inpatient  Dispo: The patient is from: Home              Anticipated d/c is to: Home              Anticipated d/c date is: 24 to 48 hours pending clinical course and cultures              Patient currently not medically stable for discharge given ongoing need for IV antibiotics  Consultants:   IR  Procedures:   Percutaneous drainage of abdominal fluid 06/20/2020  Antimicrobials:  Vancomycin, cefepime initiated 06/20/2020  Subjective: No acute issues or events overnight, patient somewhat agitated that he has not had fluid drained off of his abdomen early this morning, we discussed that IR would need to come evaluate the patient and discuss risks and benefits prior to procedure being performed.  Patient otherwise denies nausea, vomiting, diarrhea, constipation, headache, fevers, chills.  Objective: Vitals:   06/19/20 1715 06/19/20 2143 06/20/20 0122 06/20/20 0543  BP: 132/70 132/78 98/61 104/71  Pulse: 90 86 87 80  Resp: 17 17 14 16   Temp: 99.5 F (37.5 C) (!) 97.5 F (36.4 C) 99.3 F (37.4 C) 99.1 F (37.3 C)  TempSrc: Oral Oral Oral Oral  SpO2: 100% 100% 99% 99%  Weight:   80.6 kg   Height:   5\' 9"  (1.753  m)     Intake/Output Summary (Last 24 hours) at 06/20/2020 0708 Last data filed at 06/20/2020 0600 Gross per 24 hour  Intake 764.07 ml  Output 300 ml  Net 464.07 ml   Filed Weights   06/20/20 0122  Weight: 80.6 kg    Examination:  General:  Pleasantly resting in bed, No acute distress. HEENT:  Normocephalic atraumatic.  Sclerae nonicteric, noninjected.  Extraocular movements intact bilaterally. Neck:  Without mass or deformity.  Trachea is midline. Lungs:  Clear to auscultate bilaterally without rhonchi, wheeze, or rales. Heart:  Regular rate and  rhythm.  Without murmurs, rubs, or gallops. Abdomen:  Soft, nontender, nondistended.  Left-sided ostomy empty Extremities: Without cyanosis, clubbing, edema, or obvious deformity. Vascular:  Dorsalis pedis and posterior tibial pulses palpable bilaterally. Skin:  Warm and dry, no erythema, no ulcerations.  Data Reviewed: I have personally reviewed following labs and imaging studies  CBC: No results for input(s): WBC, NEUTROABS, HGB, HCT, MCV, PLT in the last 168 hours. Basic Metabolic Panel: No results for input(s): NA, K, CL, CO2, GLUCOSE, BUN, CREATININE, CALCIUM, MG, PHOS in the last 168 hours. GFR: Estimated Creatinine Clearance: 57.4 mL/min (A) (by C-G formula based on SCr of 1.3 mg/dL (H)). Liver Function Tests: No results for input(s): AST, ALT, ALKPHOS, BILITOT, PROT, ALBUMIN in the last 168 hours. No results for input(s): LIPASE, AMYLASE in the last 168 hours. No results for input(s): AMMONIA in the last 168 hours. Coagulation Profile: No results for input(s): INR, PROTIME in the last 168 hours. Cardiac Enzymes: No results for input(s): CKTOTAL, CKMB, CKMBINDEX, TROPONINI in the last 168 hours. BNP (last 3 results) No results for input(s): PROBNP in the last 8760 hours. HbA1C: No results for input(s): HGBA1C in the last 72 hours. CBG: No results for input(s): GLUCAP in the last 168 hours. Lipid Profile: No results for input(s): CHOL, HDL, LDLCALC, TRIG, CHOLHDL, LDLDIRECT in the last 72 hours. Thyroid Function Tests: No results for input(s): TSH, T4TOTAL, FREET4, T3FREE, THYROIDAB in the last 72 hours. Anemia Panel: No results for input(s): VITAMINB12, FOLATE, FERRITIN, TIBC, IRON, RETICCTPCT in the last 72 hours. Sepsis Labs: No results for input(s): PROCALCITON, LATICACIDVEN in the last 168 hours.  Recent Results (from the past 240 hour(s))  SARS Coronavirus 2 by RT PCR (hospital order, performed in [facility] hospital lab) Nasopharyngeal Nasopharyngeal Swab      Status: None   Collection Time: 06/19/20 10:11 PM   Specimen: Nasopharyngeal Swab  Result Value Ref Range Status   SARS Coronavirus 2 NEGATIVE NEGATIVE Final    Comment: (NOTE) SARS-CoV-2 target nucleic acids are NOT DETECTED.  The SARS-CoV-2 RNA is generally detectable in upper and lower respiratory specimens during the acute phase of infection. The lowest concentration of SARS-CoV-2 viral copies this assay can detect is 250 copies / mL. A negative result does not preclude SARS-CoV-2 infection and should not be used as the sole basis for treatment or other patient management decisions.  A negative result may occur with improper specimen collection / handling, submission of specimen other than nasopharyngeal swab, presence of viral mutation(s) within the areas targeted by this assay, and inadequate number of viral copies (<250 copies / mL). A negative result must be combined with clinical observations, patient history, and epidemiological information.  Fact Sheet for Patients:   StrictlyIdeas.no  Fact Sheet for Healthcare Providers: BankingDealers.co.za  This test is not yet approved or  cleared by the [place] FDA and has been authorized for detection and/or  diagnosis of SARS-CoV-2 by FDA under an Emergency Use Authorization (EUA).  This EUA will remain in effect (meaning this test can be used) for the duration of the COVID-19 declaration under Section 564(b)(1) of the Act, 21 U.S.C. section 360bbb-3(b)(1), unless the authorization is terminated or revoked sooner.  Performed at [facility], [place] [address], [place], [place] [address]          Radiology Studies: CT OUTSIDE FILMS BODY/ABD/PELVIS  Result Date: 06/19/2020 This examination belongs to an outside facility and is stored here for comparison purposes only.  Contact the originating outside institution for any associated report or  interpretation.       Scheduled Meds: . fluconazole  200 mg Oral Daily  . pantoprazole  40 mg Oral Daily   Continuous Infusions: . sodium chloride 75 mL/hr at 06/20/20 0600     LOS: 1 day   Time spent: 40 min  Little Ishikawa, DO [facility]  If 7PM-7AM, please contact night-coverage [email or web address]  06/20/2020, 7:08 AM

## 2020-06-20 NOTE — Plan of Care (Signed)
  Problem: Clinical Measurements: Goal: Will remain free from infection Outcome: Progressing   Problem: Activity: Goal: Risk for activity intolerance will decrease Outcome: Progressing   Problem: Nutrition: Goal: Adequate nutrition will be maintained Outcome: Progressing   Problem: Pain Managment: Goal: General experience of comfort will improve Outcome: Progressing   

## 2020-06-20 NOTE — Progress Notes (Signed)
Pharmacy Antibiotic Note  Juan Richards is a 64 y.o. male with prostate cancer and appendiceal cancer admitted on 06/19/2020 with pelvic fluid collection s/p IR drainage .  Pharmacy has been consulted for vancomycin and cefepime dosing.  Plan:  Cefepime 2g IV q8h  Vancomycin 1g IV q12h  Vancomycin levels prn  Follow up renal function & cultures  Height: 5\' 9"  (175.3 cm) Weight: 80.6 kg (177 lb 11.1 oz) IBW/kg (Calculated) : 70.7  Temp (24hrs), Avg:98.7 F (37.1 C), Min:97.5 F (36.4 C), Max:99.5 F (37.5 C)  Recent Labs  Lab 06/20/20 0812  WBC 8.4  CREATININE 0.99    Estimated Creatinine Clearance: 75.4 mL/min (by C-G formula based on SCr of 0.99 mg/dL).    No Known Allergies  Antimicrobials this admission: 7/3 Vancomycin >> 7/3 Cefepime >>  Dose adjustments this admission:   Microbiology results: 7/3 Pelvic abscess:   Thank you for allowing pharmacy to be a part of this patient's care.  Peggyann Juba, PharmD, BCPS Pharmacy: [phone] 06/20/2020 2:15 PM

## 2020-06-20 NOTE — Consult Note (Addendum)
Chief Complaint: Patient was seen in consultation today for CT-guided drainage left pelvic/iliacus fluid collection/? abscess  Referring Physician(s): Sherrill,B  Supervising Physician: Daryll Brod  Patient Status: [facility] - In-pt  History of Present Illness: Juan Richards is a 64 y.o. 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  He was started on FOLFOX in April of this year and was stopped in June secondary to progressive neuropathy. He has recently noted occasional chills, low-grade temp elevation and aching pain in LLQ/left groin as well as numbness and weakness in the left leg.  Outside imaging [facility]) has revealed a left pelvic sidewall/iliacus fluid collection concerning for abscess. Request now received for  CT-guided aspiration/ possible drainage of this fluid collection.  Temp 99.1, WBC 8.4, hemoglobin 8, platelets normal, PT/INR normal, creat normal, COVID-19 negative.  Past Medical History:  Diagnosis Date  . 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 DIVERTICULITIS, COLON, WITH PERFORATION 07/23/2009  . Numbness and tingling    RIGHT TOES AND LEFT LEG DUE TO L 5 DISC   . PLEURISY YRS AGO  . Prostate cancer [facility]) 2019   prostatectomy  . 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 BACK SURGERY  2017   L4 BACK SURGERY  . COLON SURGERY Right 01/26/2020   hemicolectomy, ileostomy  . COLONOSCOPY    . LAPAROTOMY N/A 01/26/2020   Procedure: EXPLORATORY LAPAROTOMY  WITH RIGHT HEMICOLECTOMY,  END ILEOSTOMY, AND MUCUS FISTULA.;  Surgeon: [name], MD;  Location: [place];  Service: General;  Laterality: N/A;  . PELVIC LYMPH NODE DISSECTION  Bilateral 09/21/2018   Procedure: BILATERAL PELVIC LYMPH NODE DISSECTION;  Surgeon: Ardis Hughs, MD;  Location: [facility];  Service: Urology;  Laterality: Bilateral;  . POLYPECTOMY    . PORTACATH PLACEMENT Left 03/10/2020   Procedure: PORT PLACEMENT;  Surgeon: [name], MD;  Location: [place];  Service: General;  Laterality: Left;  . ROBOT ASSISTED LAPAROSCOPIC RADICAL PROSTATECTOMY N/A 09/21/2018   Procedure: XI ROBOTIC ASSISTED LAPAROSCOPIC RADICAL PROSTATECTOMY;  Surgeon: Ardis Hughs, MD;  Location: [facility];  Service: Urology;  Laterality: N/A;    Allergies: Patient has no known allergies.  Medications: Prior to Admission medications   Medication Sig Start Date End Date Taking? Authorizing Provider  acetaminophen (TYLENOL) 500 MG tablet You can take 1000 mg of Tylenol/acetaminophen every 6 hours as needed for pain.  You can buy this over-the-counter at any drugstore.  Do not take more than 4000 mg of Tylenol/acetaminophen per day, it can harm your liver. 02/07/20  Yes Earnstine Regal, PA-C  dexamethasone (DECADRON) 4 MG tablet Take 1 tablet (4 mg total) by mouth as directed. Take for 2 days after each chemo beginning on day 2 04/15/20  Yes Ladell Pier, MD  diphenoxylate-atropine (LOMOTIL) 2.5-0.025 MG tablet Take 2 tablets by mouth 4 (four) times daily as needed for diarrhea or loose stools. 05/12/20  Yes Alla Feeling, NP  lidocaine-prilocaine (EMLA) cream Apply 1 application topically as needed. 03/24/20  Yes Owens Shark, NP  magnesium oxide (MAG-OX) 400 MG tablet Take 400 mg by mouth daily. Take 1 tablet 2 times daily with food.    Yes [provider]  oxyCODONE (OXY IR/ROXICODONE)  5 MG immediate release tablet Take 1 tablet (5 mg total) by mouth every 4 (four) hours as needed for breakthrough pain (Pain not relieved by plain Tylenol). 03/10/20  Yes [name], MD  pantoprazole (PROTONIX) 40 MG tablet Take 1 tablet (40 mg total) by mouth  daily. 02/08/20  Yes Vashti Hey, MD  phenazopyridine (PYRIDIUM) 200 MG tablet TAKE 1 TABLET BY MOUTH THREE TIMES DAILY AS NEEDED Patient taking differently: Take 200 mg by mouth 3 (three) times daily as needed for pain.  05/29/20  Yes Ladell Pier, MD  prochlorperazine (COMPAZINE) 10 MG tablet Take 1 tablet (10 mg total) by mouth every 6 (six) hours as needed for nausea. 05/29/20  Yes Ladell Pier, MD  loperamide (IMODIUM) 2 MG capsule Take 2 mg by mouth in the morning, at noon, in the evening, and at bedtime. Patient not taking: Reported on 06/19/2020    [provider]  ondansetron (ZOFRAN-ODT) 8 MG disintegrating tablet Take 8 mg by mouth every 8 (eight) hours as needed for nausea or vomiting. Patient not taking: Reported on 06/19/2020    [provider]     Family History  Problem Relation Age of Onset  . Diabetes Mother   . Kidney disease Mother   . Cancer Father        ?  . Diabetes Maternal Grandmother   . Colon cancer Brother   . Esophageal cancer Neg Hx   . Rectal cancer Neg Hx   . Stomach cancer Neg Hx     Social History   Socioeconomic History  . Marital status: Married    Spouse name: Not on file  . Number of children: 1  . Years of education: 80  . Highest education level: Not on file  Occupational History  . Occupation: Production assistant, radio  Tobacco Use  . Smoking status: Never Smoker  . Smokeless tobacco: Never Used  Vaping Use  . Vaping Use: Never used  Substance and Sexual Activity  . Alcohol use: Yes    Alcohol/week: 2.0 - 3.0 standard drinks    Types: 2 - 3 Cans of beer per week    Comment: not now  . Drug use: No  . Sexual activity: Yes  Other Topics Concern  . Not on file  Social History Narrative   Fun: Watch TV   Social Determinants of Health   Financial Resource Strain:   . Difficulty of Paying Living Expenses:   Food Insecurity:   . Worried About Charity fundraiser in the Last Year:   . Arboriculturist  in the Last Year:   Transportation Needs:   . Film/video editor (Medical):   Marland Kitchen Lack of Transportation (Non-Medical):   Physical Activity:   . Days of Exercise per Week:   . Minutes of Exercise per Session:   Stress:   . Feeling of Stress :   Social Connections:   . Frequency of Communication with Friends and Family:   . Frequency of Social Gatherings with Friends and Family:   . Attends Religious Services:   . Active Member of Clubs or Organizations:   . Attends Archivist Meetings:   Marland Kitchen Marital Status:       Review of Systems currently denies fever, headache, chest pain, dyspnea, cough, back pain, nausea, vomiting or bleeding  Vital Signs: BP 104/71 (BP Location: Left Arm)   Pulse 80   Temp 99.1 F (37.3 C) (Oral)   Resp 16  Ht 5\' 9"  (1.753 m)   Wt 177 lb 11.1 oz (80.6 kg)   SpO2 99%   BMI 26.24 kg/m   Physical Exam awake, alert.  Chest clear to auscultation bilaterally.  Heart with regular rate and rhythm.  Abdomen soft, positive bowel sounds, mildly tender left lower quadrant to palpation, ileostomy/colostomy intact.  No lower extremity edema.  Some difficulty with left lower extremity movement secondary to pain and paresthesias.  Imaging: CT OUTSIDE FILMS BODY/ABD/PELVIS  Result Date: 06/19/2020 This examination belongs to an outside facility and is stored here for comparison purposes only.  Contact the originating outside institution for any associated report or interpretation.   Labs:  CBC: Recent Labs    05/12/20 1142 05/27/20 0950 06/09/20 1008 06/20/20 0812  WBC 6.3 12.0* 6.1 8.4  HGB 9.2* 10.0* 9.1* 8.0*  HCT 28.7* 31.3* 28.3* 25.0*  PLT 211 162 329 388    COAGS: Recent Labs    06/20/20 0812  INR 1.2    BMP: Recent Labs    04/29/20 1201 05/12/20 1142 05/27/20 0950 06/09/20 1008  NA 135 136 142 133*  K 4.5 3.9 3.7 3.7  CL 102 104 105 102  CO2 24 26 24  20*  GLUCOSE 81 90 127* 134*  BUN 13 10 10 14   CALCIUM 10.6* 10.0  10.2 10.5*  CREATININE 0.89 0.84 0.99 1.30*  GFRNONAA >60 >60 >60 58*  GFRAA >60 >60 >60 >60    LIVER FUNCTION TESTS: Recent Labs    04/29/20 1201 05/12/20 1142 05/27/20 0950 06/09/20 1008  BILITOT 0.3 0.2* 0.4 0.6  AST 20 20 17 20   ALT 27 22 18 26   ALKPHOS 134* 103 110 128*  PROT 8.5* 7.6 7.8 8.3*  ALBUMIN 3.9 3.4* 3.4* 3.4*    TUMOR MARKERS: No results for input(s): AFPTM, CEA, CA199, CHROMGRNA in the last 8760 hours.  Assessment and Plan: 64 y.o. 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  He was started on FOLFOX in April of this year and was stopped in June secondary to progressive neuropathy. He has recently noted occasional chills, low-grade temp elevation and aching pain in LLQ/left groin as well as numbness and weakness in the left leg.  Outside imaging [facility]) has revealed a left pelvic sidewall/iliacus fluid collection concerning for abscess. Request now received for  CT-guided aspiration/ possible drainage of this fluid collection.  Temp 99.1, WBC 8.4, hemoglobin 8, platelets normal, PT/INR normal, creat normal, COVID-19 negative.  Latest imaging studies have been reviewed by Dr.Shick. Risks and benefits discussed with the patient including bleeding, infection, damage to adjacent structures, bowel perforation/fistula connection, and sepsis.  All of the patient's questions were answered, patient is agreeable to proceed. Consent signed and in chart.  Procedure scheduled for today.   Thank you for this interesting consult.  I greatly enjoyed meeting Juan Richards and look forward to participating in their care.  A copy of this report was sent to the requesting provider on this  date.  Electronically Signed: D. Rowe Robert, PA-C 06/20/2020, 8:58 AM  I spent a total of 30 minutes    in face to face in clinical consultation, greater than 50% of which was counseling/coordinating care for CT-guided aspiration/possible drainage of left pelvic fluid collection

## 2020-06-20 NOTE — Procedures (Signed)
Interventional Radiology Procedure Note  Procedure: Image guided drain placement, left lower quadrant/pelvis.  24F pigtail drain.  Findings:  Frank pus.  ~30cc aspirated.  Wash sent for cytology  Complications: None  EBL: None Sample: Culture sent. Cytology sent  Recommendations: - Routine drain care, with sterile flushes, record output - follow up Cx - routine wound care  Signed,  Dulcy Fanny. Earleen Newport, DO

## 2020-06-21 DIAGNOSIS — K651 Peritoneal abscess: Principal | ICD-10-CM

## 2020-06-21 MED ORDER — KATE FARMS STANDARD 1.4 PO LIQD
325.0000 mL | Freq: Two times a day (BID) | ORAL | Status: DC
  Administered 2020-06-21 – 2020-06-22 (×2): 325 mL via ORAL
  Filled 2020-06-21 (×5): qty 325

## 2020-06-21 MED ORDER — ADULT MULTIVITAMIN W/MINERALS CH
1.0000 | ORAL_TABLET | Freq: Every day | ORAL | Status: DC
  Administered 2020-06-22: 1 via ORAL
  Filled 2020-06-21 (×2): qty 1

## 2020-06-21 MED ORDER — PRO-STAT SUGAR FREE PO LIQD
30.0000 mL | Freq: Every day | ORAL | Status: DC
  Administered 2020-06-22: 30 mL via ORAL
  Filled 2020-06-21: qty 30

## 2020-06-21 NOTE — Progress Notes (Signed)
Referring Physician(s): Juan Richards  Supervising Physician: Juan Richards  Patient Status:  Juan Richards - In-pt  Chief Complaint:  Left abdominal pain/abscess  Subjective: Patient doing fair; does have some discomfort at left lower quadrant drain site; had some nausea and vomiting earlier today.   Allergies: Patient has no known allergies.  Medications: Prior to Admission medications   Medication Sig Start Date End Date Taking? Authorizing Provider  acetaminophen (TYLENOL) 500 MG tablet You can take 1000 mg of Tylenol/acetaminophen every 6 hours as needed for pain.  You can buy this over-the-counter at any drugstore.  Do not take more than 4000 mg of Tylenol/acetaminophen per day, it can harm your liver. 02/07/20  Yes Earnstine Regal, PA-C  dexamethasone (DECADRON) 4 MG tablet Take 1 tablet (4 mg total) by mouth as directed. Take for 2 days after each chemo beginning on day 2 04/15/20  Yes Ladell Pier, MD  diphenoxylate-atropine (LOMOTIL) 2.5-0.025 MG tablet Take 2 tablets by mouth 4 (four) times daily as needed for diarrhea or loose stools. 05/12/20  Yes Alla Feeling, NP  lidocaine-prilocaine (EMLA) cream Apply 1 application topically as needed. 03/24/20  Yes Owens Shark, NP  magnesium oxide (MAG-OX) 400 MG tablet Take 400 mg by mouth daily. Take 1 tablet 2 times daily with food.    Yes [provider]  oxyCODONE (OXY IR/ROXICODONE) 5 MG immediate release tablet Take 1 tablet (5 mg total) by mouth every 4 (four) hours as needed for breakthrough pain (Pain not relieved by plain Tylenol). 03/10/20  Yes [name], MD  pantoprazole (PROTONIX) 40 MG tablet Take 1 tablet (40 mg total) by mouth daily. 02/08/20  Yes Vashti Hey, MD  phenazopyridine (PYRIDIUM) 200 MG tablet TAKE 1 TABLET BY MOUTH THREE TIMES DAILY AS NEEDED Patient taking differently: Take 200 mg by mouth 3 (three) times daily as needed for pain.  05/29/20  Yes Ladell Pier, MD  prochlorperazine  (COMPAZINE) 10 MG tablet Take 1 tablet (10 mg total) by mouth every 6 (six) hours as needed for nausea. 05/29/20  Yes Ladell Pier, MD  loperamide (IMODIUM) 2 MG capsule Take 2 mg by mouth in the morning, at noon, in the evening, and at bedtime. Patient not taking: Reported on 06/19/2020    [provider]  ondansetron (ZOFRAN-ODT) 8 MG disintegrating tablet Take 8 mg by mouth every 8 (eight) hours as needed for nausea or vomiting. Patient not taking: Reported on 06/19/2020    [provider]     Vital Signs: BP 114/72 (BP Location: Left Arm)   Pulse 72   Temp 97.7 F (36.5 C) (Oral)   Resp 16   Ht 5\' 9"  (1.753 m)   Wt 177 lb 11.1 oz (80.6 kg)   SpO2 100%   BMI 26.24 kg/m   Physical Exam awake, alert.  Left lower quadrant drain intact, insertion site ok, mild-mod tender to palpation.  Output 20 cc blood-tinged fluid with some tissue fragments; drain flushed with saline with minimal return  Imaging: CT IMAGE GUIDED DRAINAGE BY PERCUTANEOUS CATHETER  Result Date: 06/20/2020 INDICATION: 64 year old male with a history of left lower quadrant fluid collection EXAM: CT GUIDED DRAINAGE OF PELVIC ABSCESS MEDICATIONS: The patient is currently admitted to the hospital and receiving intravenous antibiotics. The antibiotics were administered within an appropriate time frame prior to the initiation of the procedure. ANESTHESIA/SEDATION: 1.0 mg IV Versed 50 mcg IV Fentanyl Moderate Sedation Time:  20 minutes The patient was continuously monitored during  the procedure by the interventional radiology nurse under my direct supervision. COMPLICATIONS: None Richards: Informed written consent was obtained from the patient after a thorough discussion of the procedural risks, benefits and alternatives. All questions were addressed. Juan Richards was utilized including caps, mask, sterile gowns, sterile gloves, sterile drape, hand hygiene and skin antiseptic. A timeout was  performed prior to the initiation of the procedure. PROCEDURE: The operative field was prepped with Chlorhexidine in a sterile fashion, and a sterile drape was applied covering the operative field. A sterile gown and sterile gloves were used for the procedure. Local anesthesia was provided with 1% Lidocaine. Patient positioned supine position. CT scan was acquired for planning purposes. Once the patient was prepped and draped in the usual sterile fashion, 1% lidocaine was used for local anesthesia. Using CT guidance, trocar needle was advanced into the fluid collection of the left lower quadrant. Modified Seldinger Richards was used to place a 10 [place] drain into the collection. Approximately 30 cc of frank pus was aspirated. Saline wash was performed for a cytology sample. Culture was sent and cytology sample sent. Catheter was sutured in position and attached to bulb suction. Final CT was acquired. Patient tolerated the procedure well and remained hemodynamically stable throughout. No complications were encountered and no significant blood loss. FINDINGS: CT demonstrates ill-defined fluid collection corresponding to the prior contrast-enhanced CT, involving the left ileo psoas musculature. Approximately 30 cc of purulent material aspirated. IMPRESSION: Status post CT-guided drainage of abscess of the left lower quadrant with both cytology sample and culture sent. Signed, Juan Richards. Juan Richards, RPVI [facility] Radiology Electronically Signed   By: Juan Richards D.O.   On: 06/20/2020 13:30   CT OUTSIDE FILMS BODY/ABD/PELVIS  Result Date: 06/19/2020 This examination belongs to an outside facility and is stored here for comparison purposes only.  Contact the originating outside institution for any associated report or interpretation.   Labs:  CBC: Recent Labs    05/12/20 1142 05/27/20 0950 06/09/20 1008 06/20/20 0812  WBC 6.3 12.0* 6.1 8.4  HGB 9.2*  10.0* 9.1* 8.0*  HCT 28.7* 31.3* 28.3* 25.0*  PLT 211 162 329 388    COAGS: Recent Labs    06/20/20 0812  INR 1.2    BMP: Recent Labs    05/12/20 1142 05/27/20 0950 06/09/20 1008 06/20/20 0812  NA 136 142 133* 131*  K 3.9 3.7 3.7 4.4  CL 104 105 102 98  CO2 26 24 20* 23  GLUCOSE 90 127* 134* 100*  BUN 10 10 14 20   CALCIUM 10.0 10.2 10.5* 10.0  CREATININE 0.84 0.99 1.30* 0.99  GFRNONAA >60 >60 58* >60  GFRAA >60 >60 >60 >60    LIVER FUNCTION TESTS: Recent Labs    05/12/20 1142 05/27/20 0950 06/09/20 1008 06/20/20 0812  BILITOT 0.2* 0.4 0.6 0.7  AST 20 17 20 30   ALT 22 18 26  59*  ALKPHOS 103 110 128* 155*  PROT 7.6 7.8 8.3* 8.0  ALBUMIN 3.4* 3.4* 3.4* 3.0*    Assessment and Plan: Patient with past history of prostate carcinoma as well as metastatic appendiceal carcinoma with prior surgical resection; now with left lower quadrant fluid collection/ abscess; s/p left lower quadrant drain 7/3; afebrile, no new labs, drain fluid culture growing E. Coli, drain fluid cytology pending; continue drain irrigation/output monitoring; once drain output less than 10 to 15 cc/day for 2-3 consecutive days or if clinical status worsens obtain follow-up CT  Electronically Signed: D. Rowe Robert, PA-C 06/21/2020, 2:55 PM   I spent a total of 15 minutes at the the patient's bedside AND on the patient's hospital floor or unit, greater than 50% of which was counseling/coordinating care for left pelvic abscess drain    Patient ID: Juan Richards, male   DOB: 10-19-1956, 64 y.o.   MRN: [id number]

## 2020-06-21 NOTE — Progress Notes (Signed)
Initial Nutrition Assessment  DOCUMENTATION CODES:   Not applicable  INTERVENTION:  - will order [employer] BID, each supplement provides 455 kcal and 20 grams protein. - will order 30 mL Prostat BID, each supplement provides 100 kcal and 15 grams of protein. - will order 1 tablet multivitamin with minerals.  NUTRITION DIAGNOSIS:   Increased nutrient needs related to acute illness, chronic illness, cancer and cancer related treatments as evidenced by estimated needs.  GOAL:   Patient will meet greater than or equal to 90% of their needs  MONITOR:   PO intake, Supplement acceptance, Labs, Weight trends  REASON FOR ASSESSMENT:   Malnutrition Screening Tool  ASSESSMENT:   64 y.o. male with medical history of prostate cancer s/p prostatectomy in 2019 and metastatic appendiceal cancer s/p FOLFOX and resection. He presented to the ED as a direct admission from the [place] for management of a pelvic fluid collection. 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 He reported recent chills and aching pain in his L groin and numbness and subjective weakness in the L leg.  He underwent CT scan for restaging today and was noted to have an enhancing fluid collection in the L pelvis. In clinic he was found to have oral candidiasis.  Patient is currently out of the room to CT for guided drainage of fluid collection in L pelvic region. Diet advanced from NPO to Regular yesterday at 1255 and the only documented intake is 50% of breakfast this AM.   Weight yesterday was 178 lb and weight on 6/9 was 184 lb. This indicates 6 lb weight loss (3.3% body weight) in the past 1 month; not significant for time frame.   He is being followed on a monthly basis by RD at the [facility] and was last seen on 6/9. At that time he reported  ongoing diarrhea and that he was not taking prescribes lomotil and would often forget to take other medications. RD had discussed strategies for stool thickened and discussed the importance of adequate fluid intake d/t high ileostomy output.    Labs reviewed; Na: 131 mmol/l, alk phos elevated. Medications reviewed.    NUTRITION - FOCUSED PHYSICAL EXAM:  unable to complete at this time.   Diet Order:   Diet Order            Diet regular Room service appropriate? Yes; Fluid consistency: Thin  Diet effective now                 EDUCATION NEEDS:   No education needs have been identified at this time  Skin:  Skin Assessment: Reviewed RN Assessment  Last BM:  7/3  Height:   Ht Readings from Last 1 Encounters:  06/20/20 '5\' 9"'$  (1.753 m)    Weight:   Wt Readings from Last 1 Encounters:  06/20/20 80.6 kg    Estimated Nutritional Needs:  Kcal:  2300-2500 kcal Protein:  110-125 grams Fluid:  >/= 2.5 L/day     Jarome Matin, MS, RD, LDN, CNSC Inpatient Clinical Dietitian RD pager # available in [facility]  After hours/weekend pager # available in [facility]

## 2020-06-21 NOTE — Progress Notes (Signed)
PROGRESS NOTE    Juan Richards  MOQ:[id number] DOB: [birth year] DOA: 06/19/2020 PCP: Juan Borg, MD   Brief Narrative:  Juan Richards is a 64 y.o. 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  Patient has noted recent chills and aching pain in his left groin, as well as numbness and subjective weakness in the left leg.  He underwent CT scan for restaging today and was noted to have an enhancing fluid collection in the left pelvis.  He was evaluated by his oncologist for this in the clinic, also noted to have oral candidiasis, and was directed to the hospital for drainage by IR.    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 - Continue vancomycin and cefepime - Follow cultures from fluid aspirate, de-escalate as possible  Oral candidiasis  - Continue Diflucan   Appendiceal carcinoma  - Underwent resection in Feb 2021 and FOLFOX from April until June 2021 and stopped due to neuropathy  - Staging CT at [facility] 06/19/20 with pelvic fluid-collection as  above    Left leg numbness and weakness  - Suspected secondary to pelvic fluid collection and will be reassessed after drainage of that     DVT prophylaxis: SCDs  Code Status: Full  Family Communication: None present  Status is: Inpatient  Dispo: The patient is from: Home              Anticipated d/c is to: Home              Anticipated d/c date is: 24 to 48 hours pending clinical course and cultures              Patient currently not medically stable for discharge given ongoing need for IV antibiotics  Consultants:   IR  Procedures:   Percutaneous drainage of abdominal fluid 06/20/2020  Antimicrobials:  Vancomycin, cefepime initiated 06/20/2020  Subjective: No acute issues or events overnight, tolerated procedure well, improving leg weakness, abdominal pain, denies nauseas or vomiting, diarrhea, constipation, headache, fevers, chills.  Objective: Vitals:   06/20/20 1250 06/20/20 1335 06/20/20 2125 06/21/20 0531  BP: 119/68 110/68 110/69 111/69  Pulse: 79 75 78 68  Resp: 17 17 17 16   Temp:  98.4 F (36.9 C) 98.8 F (37.1 C) 98.6 F (37 C)  TempSrc:  Oral Oral Oral  SpO2: 97% 99% 99% 100%  Weight:      Height:        Intake/Output Summary (Last 24 hours) at 06/21/2020 0806 Last data filed at 06/20/2020 2020 Gross per 24 hour  Intake --  Output 30 ml  Net -30 ml  Filed Weights   06/20/20 0122  Weight: 80.6 kg    Examination:  General:  Pleasantly resting in bed, No acute distress. HEENT:  Normocephalic atraumatic.  Sclerae nonicteric, noninjected.  Extraocular movements intact bilaterally. Neck:  Without mass or deformity.  Trachea is midline. Lungs:  Clear to auscultate bilaterally without rhonchi, wheeze, or rales. Heart:  Regular rate and rhythm.  Without murmurs, rubs, or gallops. Abdomen:  Soft, nontender, nondistended.  Left-sided ostomy empty Extremities: Without cyanosis, clubbing, edema, or obvious deformity. LLE strength 4/5, globally 5/5  otherwise Vascular:  Dorsalis pedis and posterior tibial pulses palpable bilaterally. Skin:  Warm and dry, no erythema, no ulcerations.  Data Reviewed: I have personally reviewed following labs and imaging studies  CBC: Recent Labs  Lab 06/20/20 0812  WBC 8.4  NEUTROABS 6.7  HGB 8.0*  HCT 25.0*  MCV 96.2  PLT 998   Basic Metabolic Panel: Recent Labs  Lab 06/20/20 0812  NA 131*  K 4.4  CL 98  CO2 23  GLUCOSE 100*  BUN 20  CREATININE 0.99  CALCIUM 10.0  MG 1.7   GFR: Estimated Creatinine Clearance: 75.4 mL/min (by C-G formula based on SCr of 0.99 mg/dL). Liver Function Tests: Recent Labs  Lab 06/20/20 0812  AST 30  ALT 59*  ALKPHOS 155*  BILITOT 0.7  PROT 8.0  ALBUMIN 3.0*   No results for input(s): LIPASE, AMYLASE in the last 168 hours. No results for input(s): AMMONIA in the last 168 hours. Coagulation Profile: Recent Labs  Lab 06/20/20 0812  INR 1.2   Cardiac Enzymes: No results for input(s): CKTOTAL, CKMB, CKMBINDEX, TROPONINI in the last 168 hours. BNP (last 3 results) No results for input(s): PROBNP in the last 8760 hours. HbA1C: No results for input(s): HGBA1C in the last 72 hours. CBG: No results for input(s): GLUCAP in the last 168 hours. Lipid Profile: No results for input(s): CHOL, HDL, LDLCALC, TRIG, CHOLHDL, LDLDIRECT in the last 72 hours. Thyroid Function Tests: No results for input(s): TSH, T4TOTAL, FREET4, T3FREE, THYROIDAB in the last 72 hours. Anemia Panel: No results for input(s): VITAMINB12, FOLATE, FERRITIN, TIBC, IRON, RETICCTPCT in the last 72 hours. Sepsis Labs: No results for input(s): PROCALCITON, LATICACIDVEN in the last 168 hours.  Recent Results (from the past 240 hour(s))  SARS Coronavirus 2 by RT PCR (hospital order, performed in [facility] hospital lab) Nasopharyngeal Nasopharyngeal Swab     Status: None   Collection Time: 06/19/20 10:11 PM   Specimen: Nasopharyngeal Swab  Result Value Ref Range Status   SARS  Coronavirus 2 NEGATIVE NEGATIVE Final    Comment: (NOTE) SARS-CoV-2 target nucleic acids are NOT DETECTED.  The SARS-CoV-2 RNA is generally detectable in upper and lower respiratory specimens during the acute phase of infection. The lowest concentration of SARS-CoV-2 viral copies this assay can detect is 250 copies / mL. A negative result does not preclude SARS-CoV-2 infection and should not be used as the sole basis for treatment or other patient management decisions.  A negative result may occur with improper specimen collection / handling, submission of specimen other than nasopharyngeal swab, presence of viral mutation(s) within the areas targeted by this assay, and inadequate number of viral copies (<250 copies / mL). A negative result must be combined with clinical observations, patient history, and epidemiological information.  Fact Sheet for Patients:   StrictlyIdeas.no  Fact Sheet for Healthcare Providers: BankingDealers.co.za  This test is not yet approved or  cleared by the [place] FDA and has  been authorized for detection and/or diagnosis of SARS-CoV-2 by FDA under an Emergency Use Authorization (EUA).  This EUA will remain in effect (meaning this test can be used) for the duration of the COVID-19 declaration under Section 564(b)(1) of the Act, 21 U.S.C. section 360bbb-3(b)(1), unless the authorization is terminated or revoked sooner.  Performed at [facility], [place] [address], [place], [place] [address]   Aerobic/Anaerobic Culture (surgical/deep wound)     Status: None (Preliminary result)   Collection Time: 06/20/20 12:45 PM   Specimen: Abscess  Result Value Ref Range Status   Specimen Description   Final    ABSCESS Performed at [facility] [address], [place], [place] [address]    Special Requests   Final    NONE Performed at [facility], [place]  [address] [place], [place] [address]    Gram Stain   Final    ABUNDANT WBC PRESENT, PREDOMINANTLY PMN MODERATE GRAM POSITIVE COCCI IN PAIRS RARE GRAM NEGATIVE RODS RARE GRAM POSITIVE RODS Performed at [facility], [place] [address] [place],  [address]    Culture PENDING  Incomplete   Report Status PENDING  Incomplete         Radiology Studies: CT IMAGE GUIDED DRAINAGE BY PERCUTANEOUS CATHETER  Result Date: 06/20/2020 INDICATION: 64 year old male with a history of left lower quadrant fluid collection EXAM: CT GUIDED DRAINAGE OF PELVIC ABSCESS MEDICATIONS: The patient is currently admitted to the hospital and receiving intravenous antibiotics. The antibiotics were administered within an appropriate time frame prior to the initiation of the procedure. ANESTHESIA/SEDATION: 1.0 mg IV Versed 50 mcg IV Fentanyl Moderate Sedation Time:  20 minutes The patient was continuously monitored during the procedure by the interventional radiology nurse under my direct supervision. COMPLICATIONS: None TECHNIQUE: Informed written consent was obtained from the patient after a thorough discussion of the procedural risks, benefits and alternatives. All questions were addressed. Maximal Sterile Barrier Technique was utilized including caps, mask, sterile gowns, sterile gloves, sterile drape, hand hygiene and skin antiseptic. A timeout was performed prior to the initiation of the procedure. PROCEDURE: The operative field was prepped with Chlorhexidine in a sterile fashion, and a sterile drape was applied covering the operative field. A sterile gown and sterile gloves were used for the procedure. Local anesthesia was provided with 1% Lidocaine. Patient positioned supine position. CT scan was acquired for planning purposes. Once the patient was prepped and draped in the usual sterile fashion, 1% lidocaine was used for local anesthesia. Using CT guidance, trocar needle was advanced into the fluid collection of  the left lower quadrant. Modified Seldinger technique was used to place a 10 [place] drain into the collection. Approximately 30 cc of frank pus was aspirated. Saline wash was performed for a cytology sample. Culture was sent and cytology sample sent. Catheter was sutured in position and attached to bulb suction. Final CT was acquired. Patient tolerated the procedure well and remained hemodynamically stable throughout. No complications were encountered and no significant blood loss. FINDINGS: CT demonstrates ill-defined fluid collection corresponding to the prior contrast-enhanced CT, involving the left ileo psoas musculature. Approximately 30 cc of purulent material aspirated. IMPRESSION: Status post CT-guided drainage of abscess of the left lower quadrant with both cytology sample and culture sent. Signed, Dulcy Fanny. Dellia Nims, RPVI [facility] Electronically Signed   By: Corrie Mckusick D.O.   On: 06/20/2020 13:30   CT OUTSIDE FILMS BODY/ABD/PELVIS  Result  Date: 06/19/2020 This examination belongs to an outside facility and is stored here for comparison purposes only.  Contact the originating outside institution for any associated report or interpretation.  Scheduled Meds:  Chlorhexidine Gluconate Cloth  6 each Topical Daily   fluconazole  200 mg Oral Daily   pantoprazole  40 mg Oral Daily   sodium chloride flush  5 mL Intracatheter Q8H   Continuous Infusions:  ceFEPime (MAXIPIME) IV 2 g (06/21/20 0615)   vancomycin 1,000 mg (06/21/20 0406)     LOS: 2 days   Time spent: 40 min  Little Ishikawa, DO [facility]  If 7PM-7AM, please contact night-coverage [email or web address]  06/21/2020, 8:06 AM

## 2020-06-21 NOTE — Progress Notes (Addendum)
IP PROGRESS NOTE  Subjective:   Mr. House was admitted on 06/19/2020 for management of a probable left pelvic abscess.  He underwent drainage yesterday and 30 cc of purulent material were aspirated.  He reports improvement in left leg weakness following this procedure.  He has soreness at the drain site.  Objective: Vital signs in last 24 hours: Blood pressure 111/69, pulse 68, temperature 98.6 F (37 C), temperature source Oral, resp. rate 16, height '5\' 9"'$  (1.753 m), weight 177 lb 11.1 oz (80.6 kg), SpO2 100 %.  Intake/Output from previous day: 07/03 0701 - 07/04 0700 In: -  Out: 30 [Drains:30]  Physical Exam:  HEENT: Thick white coat over the tongue  Abdomen: Right abdomen ileostomy with soft stool, left abdomen mucous fistula, left pelvic drain with a gauze dressing Extremities: No leg edema Neurologic: 5/5 strength at the left foot, 4/5 strength with left straight leg raise  Portacath/PICC-without erythema  Lab Results: Recent Labs    06/20/20 0812  WBC 8.4  HGB 8.0*  HCT 25.0*  PLT 388    BMET Recent Labs    06/20/20 0812  NA 131*  K 4.4  CL 98  CO2 23  GLUCOSE 100*  BUN 20  CREATININE 0.99  CALCIUM 10.0    Lab Results  Component Value Date   CEA1 1.53 03/02/2020    Studies/Results: CT IMAGE GUIDED DRAINAGE BY PERCUTANEOUS CATHETER  Result Date: 06/20/2020 INDICATION: 64 year old male with a history of left lower quadrant fluid collection EXAM: CT GUIDED DRAINAGE OF PELVIC ABSCESS MEDICATIONS: The patient is currently admitted to the hospital and receiving intravenous antibiotics. The antibiotics were administered within an appropriate time frame prior to the initiation of the procedure. ANESTHESIA/SEDATION: 1.0 mg IV Versed 50 mcg IV Fentanyl Moderate Sedation Time:  20 minutes The patient was continuously monitored during the procedure by the interventional radiology nurse under my direct supervision. COMPLICATIONS: None TECHNIQUE: Informed written  consent was obtained from the patient after a thorough discussion of the procedural risks, benefits and alternatives. All questions were addressed. Maximal Sterile Barrier Technique was utilized including caps, mask, sterile gowns, sterile gloves, sterile drape, hand hygiene and skin antiseptic. A timeout was performed prior to the initiation of the procedure. PROCEDURE: The operative field was prepped with Chlorhexidine in a sterile fashion, and a sterile drape was applied covering the operative field. A sterile gown and sterile gloves were used for the procedure. Local anesthesia was provided with 1% Lidocaine. Patient positioned supine position. CT scan was acquired for planning purposes. Once the patient was prepped and draped in the usual sterile fashion, 1% lidocaine was used for local anesthesia. Using CT guidance, trocar needle was advanced into the fluid collection of the left lower quadrant. Modified Seldinger technique was used to place a 10 [place] drain into the collection. Approximately 30 cc of frank pus was aspirated. Saline wash was performed for a cytology sample. Culture was sent and cytology sample sent. Catheter was sutured in position and attached to bulb suction. Final CT was acquired. Patient tolerated the procedure well and remained hemodynamically stable throughout. No complications were encountered and no significant blood loss. FINDINGS: CT demonstrates ill-defined fluid collection corresponding to the prior contrast-enhanced CT, involving the left ileo psoas musculature. Approximately 30 cc of purulent material aspirated. IMPRESSION: Status post CT-guided drainage of abscess of the left lower quadrant with both cytology sample and culture sent. Signed, Dulcy Fanny. Dellia Nims, [place] [facility] Electronically Signed  By: Corrie Mckusick D.O.   On: 06/20/2020 13:30   CT OUTSIDE FILMS BODY/ABD/PELVIS  Result Date: 06/19/2020 This  examination belongs to an outside facility and is stored here for comparison purposes only.  Contact the originating outside institution for any associated report or interpretation.   Medications: I have reviewed the patient's current medications.  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, 12 tumor deposits, 0/10 lymph nodes positive, negative resection margins, metastatic carcinoma involving the ileal mesentery noted, low tumor purity with decreased sensitivity for detection of HER-2 copy number and other alterations, microsatellite status and tumor mutation burden could not be determined,MSS, no loss of mismatch repair protein expression -Diffuse subcentimeter nodularity over the mesentery and bowel, sigmoid colon adherent to bladder and left middle pelvic sidewall, gross contaminationwith succus -CTs 03/12/2020-no chest metastases, long segment of sigmoid colon thickening, no discrete peritoneal nodule, nodular anterior bladder surface -Cycle 1 FOLFOX 03/31/2020 -Cycle 2 FOLFOX 04/15/2020, Emend added -Cycle 3 FOLFOX 04/29/2020, home Decadron prophylaxis added             -Cycle 4 FOLFOX 05/12/2020             -Cycle 5 FOLFOX 05/27/2020, oxaliplatin held due to neuropathy symptoms             -CTs at [facility] 06/19/2020-mild right hydronephrosis due to compression of the ureter in the region of the sigmoid colon, peripheral enhancing fluid collection at the left iliac is muscle adjacent to the sigmoid colon.  Omental haziness and nodularity lower abdomen and around the descending colon in the left paracolic  gutter, soft tissue stranding around the sigmoid colon extends to the superior bladder dome, asymmetric thickening of the bladder wall  2.Prostate cancer diagnosed in August 2019, robotic assisted prostatectomy 09/21/2018-Gleason 7 acinar tumor,pT3pN1,invasion of seminal vesicles, 3/16 lymph nodes positive, positive right anterior margin positive -Gleason score 4+3 equal 7 and PSA of 60.97. -Staging work-up did not show any evidence of metastatic disease. -Bone scan 08/17/2018-negative for metastatic disease -Elevated PSA July 2019 -PET scanscan 10/15/2019-negative for metastatic disease, evidence of sigmoid diverticulitis with a small contained perforation -Radiation-definitive, 10/29/2019-01/15/2020, interrupted 11/22/2019-12/17/2019 secondary to COVID-19 infection  3. Anemia secondary to chronic disease and chemotherapy 4. Deconditioning 5.COVID-19 infection December 2020 6.Orthostatic hypotension 03/19/2020 secondary to high output ileostomy 7.Hypomagnesemia secondary to high-output ileostomy, repleted IV 03/19/2020,03/24/2020,03/31/2020 8.  Oral candidiasis 06/19/2020 9.  Left leg weakness and numbness-nerve compression related to the left pelvic inflammatory process?  Improved following abscess drainage 06/20/2020 10.  CT-guided drainage of left pelvic abscess 06/20/2020   Mr. Banas appears stable.  He underwent drainage of the pelvic abscess with placement of a pelvic drain yesterday.  Cultures are pending.  He is stable for discharge from an oncology standpoint.  FOLFOX chemotherapy will be placed on hold for now.  Outpatient follow-up will be scheduled at the Cancer center.   LOS: 2 days   Betsy Coder, MD   06/21/2020, 8:13 AM

## 2020-06-22 LAB — CBC
HCT: 24.5 % — ABNORMAL LOW (ref 39.0–52.0)
Hemoglobin: 7.6 g/dL — ABNORMAL LOW (ref 13.0–17.0)
MCH: 30.3 pg (ref 26.0–34.0)
MCHC: 31 g/dL (ref 30.0–36.0)
MCV: 97.6 fL (ref 80.0–100.0)
Platelets: 329 10*3/uL (ref 150–400)
RBC: 2.51 MIL/uL — ABNORMAL LOW (ref 4.22–5.81)
RDW: 14.1 % (ref 11.5–15.5)
WBC: 5 10*3/uL (ref 4.0–10.5)
nRBC: 0 % (ref 0.0–0.2)

## 2020-06-22 LAB — HEMOGLOBIN AND HEMATOCRIT, BLOOD
HCT: 24.7 % — ABNORMAL LOW (ref 39.0–52.0)
Hemoglobin: 7.8 g/dL — ABNORMAL LOW (ref 13.0–17.0)

## 2020-06-22 LAB — BASIC METABOLIC PANEL
Anion gap: 8 (ref 5–15)
BUN: 17 mg/dL (ref 8–23)
CO2: 22 mmol/L (ref 22–32)
Calcium: 10 mg/dL (ref 8.9–10.3)
Chloride: 104 mmol/L (ref 98–111)
Creatinine, Ser: 0.87 mg/dL (ref 0.61–1.24)
GFR calc Af Amer: >60 mL/min (ref >60)
GFR calc non Af Amer: >60 mL/min (ref >60)
Glucose, Bld: 122 mg/dL — ABNORMAL HIGH (ref 70–99)
Potassium: 3.7 mmol/L (ref 3.5–5.1)
Sodium: 134 mmol/L — ABNORMAL LOW (ref 135–145)

## 2020-06-22 NOTE — Progress Notes (Signed)
PROGRESS NOTE    Juan Richards  DUK:[id number] DOB: 1956-03-02 DOA: 06/19/2020 PCP: [name], MD   Brief Narrative:  Juan Richards is a 64 y.o. 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  Patient has noted recent chills and aching pain in his left groin, as well as numbness and subjective weakness in the left leg.  He underwent CT scan for restaging today and was noted to have an enhancing fluid collection in the left pelvis.  He was evaluated by his oncologist for this in the clinic, also noted to have oral candidiasis, and was directed to the hospital for drainage by IR.    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 - Continue vancomycin and cefepime - Follow cultures from fluid aspirate, de-escalate as possible - prelim results Ecoli with ? Gm + cocci  Acute on chronic anemia likely of chronic disease  - H&H continues to downtrend, likely at least partially dilutional, baseline hemoglobin 9.5-10 - Repeat H&H this afternoon and follow  morning labs to trend better, no notable blood loss eating on exam CBC Latest Ref Rng & Units 06/22/2020 06/20/2020 06/09/2020  WBC 4.0 - 10.5 K/uL 5.0 8.4 6.1  Hemoglobin 13.0 - 17.0 g/dL 7.6(L) 8.0(L) 9.1(L)  Hematocrit 39 - 52 % 24.5(L) 25.0(L) 28.3(L)  Platelets 150 - 400 K/uL 329 388 329     Oral candidiasis - Continue Diflucan   Appendiceal carcinoma  - Underwent resection in Feb 2021 and FOLFOX from April until June 2021 and stopped due to neuropathy  - Staging CT at [facility] 06/19/20 with pelvic fluid-collection as above    Left leg numbness and weakness  - Suspected secondary to pelvic fluid collection and will be reassessed after drainage of that    DVT prophylaxis: SCDs  Code Status: Full  Family Communication: None present  Status is: Inpatient  Dispo: The patient is from: Home              Anticipated d/c is to: Home              Anticipated d/c date is: 24 to 48 hours pending clinical course and cultures              Patient currently not medically stable for discharge given ongoing need for IV antibiotics  Consultants:   IR  Procedures:   Percutaneous drainage of abdominal fluid 06/20/2020  Antimicrobials:  Vancomycin, cefepime initiated 06/20/2020  Subjective: No acute issues or events overnight, leg weakness essentially resolving otherwise denies abdominal pain, nauseas or vomiting, diarrhea, constipation, headache, fevers, chills.  Objective: Vitals:   06/21/20 0531 06/21/20 1329  06/21/20 2056 06/22/20 0538  BP: 111/69 114/72 (!) 111/59 103/64  Pulse: 68 72 65 62  Resp: 16 16 16 16   Temp: 98.6 F (37 C) 97.7 F (36.5 C) 98.1 F (36.7 C) 97.7 F (36.5 C)  TempSrc: Oral Oral Oral Oral  SpO2: 100% 100% 99% 100%  Weight:      Height:        Intake/Output Summary (Last 24 hours) at 06/22/2020 0755 Last data filed at 06/22/2020 7564 Gross per 24 hour  Intake 240 ml  Output 50 ml  Net 190 ml   Filed Weights   06/20/20 0122  Weight: 80.6 kg     Examination:  General:  Pleasantly resting in bed, No acute distress. HEENT:  Normocephalic atraumatic.  Sclerae nonicteric, noninjected.  Extraocular movements intact bilaterally. Neck:  Without mass or deformity.  Trachea is midline. Lungs:  Clear to auscultate bilaterally without rhonchi, wheeze, or rales. Heart:  Regular rate and rhythm.  Without murmurs, rubs, or gallops. Abdomen:  Soft, nontender, nondistended.  Left-sided ostomy empty Extremities: Without cyanosis, clubbing, edema, or obvious deformity. Strength globally 5/5 Vascular:  Dorsalis pedis and posterior tibial pulses palpable bilaterally. Skin:  Warm and dry, no erythema, no ulcerations.  Data Reviewed: I have personally reviewed following labs and imaging studies  CBC: Recent Labs  Lab 06/20/20 0812 06/22/20 0628  WBC 8.4 5.0  NEUTROABS 6.7  --   HGB 8.0* 7.6*  HCT 25.0* 24.5*  MCV 96.2 97.6  PLT 388 332   Basic Metabolic Panel: Recent Labs  Lab 06/20/20 0812 06/22/20 0628  NA 131* 134*  K 4.4 3.7  CL 98 104  CO2 23 22  GLUCOSE 100* 122*  BUN 20 17  CREATININE 0.99 0.87  CALCIUM 10.0 10.0  MG 1.7  --    GFR: Estimated Creatinine Clearance: 85.8 mL/min (by C-G formula based on SCr of 0.87 mg/dL). Liver Function Tests: Recent Labs  Lab 06/20/20 0812  AST 30  ALT 59*  ALKPHOS 155*  BILITOT 0.7  PROT 8.0  ALBUMIN 3.0*   No results for input(s): LIPASE, AMYLASE in the last 168 hours. No results for input(s): AMMONIA in the last 168 hours. Coagulation Profile: Recent Labs  Lab 06/20/20 0812  INR 1.2   Cardiac Enzymes: No results for input(s): CKTOTAL, CKMB, CKMBINDEX, TROPONINI in the last 168 hours. BNP (last 3 results) No results for input(s): PROBNP in the last 8760 hours. HbA1C: No results for input(s): HGBA1C in the last 72 hours. CBG: No results for input(s): GLUCAP in the last 168 hours. Lipid Profile: No results for input(s): CHOL, HDL, LDLCALC, TRIG, CHOLHDL, LDLDIRECT  in the last 72 hours. Thyroid Function Tests: No results for input(s): TSH, T4TOTAL, FREET4, T3FREE, THYROIDAB in the last 72 hours. Anemia Panel: No results for input(s): VITAMINB12, FOLATE, FERRITIN, TIBC, IRON, RETICCTPCT in the last 72 hours. Sepsis Labs: No results for input(s): PROCALCITON, LATICACIDVEN in the last 168 hours.  Recent Results (from the past 240 hour(s))  SARS Coronavirus 2 by RT PCR (hospital order, performed in [facility] hospital lab) Nasopharyngeal Nasopharyngeal Swab     Status: None   Collection Time: 06/19/20 10:11 PM   Specimen: Nasopharyngeal Swab  Result Value Ref Range Status   SARS Coronavirus 2 NEGATIVE NEGATIVE Final    Comment: (NOTE) SARS-CoV-2 target nucleic acids are NOT DETECTED.  The SARS-CoV-2 RNA is generally detectable in upper and lower respiratory specimens during the acute phase of infection. The lowest concentration of SARS-CoV-2 viral  copies this assay can detect is 250 copies / mL. A negative result does not preclude SARS-CoV-2 infection and should not be used as the sole basis for treatment or other patient management decisions.  A negative result may occur with improper specimen collection / handling, submission of specimen other than nasopharyngeal swab, presence of viral mutation(s) within the areas targeted by this assay, and inadequate number of viral copies (<250 copies / mL). A negative result must be combined with clinical observations, patient history, and epidemiological information.  Fact Sheet for Patients:   StrictlyIdeas.no  Fact Sheet for Healthcare Providers: BankingDealers.co.za  This test is not yet approved or  cleared by the [place] FDA and has been authorized for detection and/or diagnosis of SARS-CoV-2 by FDA under an Emergency Use Authorization (EUA).  This EUA will remain in effect (meaning this test can be used) for the duration of the COVID-19  declaration under Section 564(b)(1) of the Act, 21 U.S.C. section 360bbb-3(b)(1), unless the authorization is terminated or revoked sooner.  Performed at [facility], [place] [address], [place], [place] [address]   Aerobic/Anaerobic Culture (surgical/deep wound)     Status: None (Preliminary result)   Collection Time: 06/20/20 12:45 PM   Specimen: Abscess  Result Value Ref Range Status   Specimen Description   Final    ABSCESS Performed at [place] [address] [place], [place] [address]    Special Requests   Final    NONE Performed at [facility], [place] [address] [place], [place] [address]    Gram Stain   Final    ABUNDANT WBC PRESENT, PREDOMINANTLY PMN MODERATE GRAM POSITIVE COCCI IN PAIRS FEW GRAM NEGATIVE RODS RARE GRAM POSITIVE RODS Performed at [facility], [place] [address] [place],  [address]    Culture ABUNDANT ESCHERICHIA COLI  Final   Report Status PENDING  Incomplete         Radiology Studies: CT IMAGE GUIDED DRAINAGE BY PERCUTANEOUS CATHETER  Result Date: 06/20/2020 INDICATION: 64 year old male with a history of left lower quadrant fluid collection EXAM: CT GUIDED DRAINAGE OF PELVIC ABSCESS MEDICATIONS: The patient is currently admitted to the hospital and receiving intravenous antibiotics. The antibiotics were administered within an appropriate time frame prior to the initiation of the procedure. ANESTHESIA/SEDATION: 1.0 mg IV Versed 50 mcg IV Fentanyl Moderate Sedation Time:  20 minutes The patient was continuously monitored during the procedure by the interventional radiology nurse under my direct supervision. COMPLICATIONS: None TECHNIQUE: Informed written consent was obtained from the patient after a thorough discussion of the procedural risks, benefits and alternatives. All questions were addressed. Maximal Sterile Barrier Technique was utilized including caps, mask, sterile gowns,  sterile gloves, sterile drape, hand hygiene and skin antiseptic. A timeout was performed prior to the initiation of the procedure. PROCEDURE: The operative field was prepped with Chlorhexidine in a sterile fashion, and a sterile drape was applied covering the operative field. A sterile gown and sterile gloves were used for the procedure. Local anesthesia was provided with 1% Lidocaine. Patient positioned supine position. CT scan was acquired for planning purposes. Once the patient was prepped and draped in the usual sterile fashion, 1% lidocaine was used for local anesthesia. Using CT guidance, trocar needle was advanced into the fluid collection of the left lower quadrant. Modified Seldinger technique was used to place a 10 [place] drain into the collection. Approximately 30 cc of frank pus was aspirated. Saline wash was performed for  a cytology sample. Culture was sent and cytology sample sent. Catheter was sutured in position and attached to bulb suction. Final CT was acquired. Patient tolerated the procedure well and remained hemodynamically stable throughout. No complications were encountered and no significant blood loss. FINDINGS: CT demonstrates ill-defined fluid collection corresponding to the prior contrast-enhanced CT, involving the left ileo psoas musculature. Approximately 30 cc of purulent material aspirated. IMPRESSION: Status post CT-guided drainage of abscess of the left lower quadrant with both cytology sample and culture sent. Signed, Dulcy Fanny. Dellia Nims, RPVI [facility] Radiology Electronically Signed   By: Corrie Mckusick D.O.   On: 06/20/2020 13:30   Scheduled Meds: . Chlorhexidine Gluconate Cloth  6 each Topical Daily  . feeding supplement (KATE FARMS STANDARD 1.4)  325 mL Oral BID BM  . feeding supplement (PRO-STAT SUGAR FREE 64)  30 mL Oral Daily  . fluconazole  200 mg Oral Daily  . multivitamin with minerals  1 tablet Oral Daily  .  pantoprazole  40 mg Oral Daily  . sodium chloride flush  5 mL Intracatheter Q8H   Continuous Infusions: . ceFEPime (MAXIPIME) IV 2 g (06/22/20 7893)  . vancomycin 1,000 mg (06/22/20 0417)     LOS: 3 days   Time spent: 40 min  Little Ishikawa, DO [facility]  If 7PM-7AM, please contact night-coverage [email or web address]  06/22/2020, 7:55 AM

## 2020-06-23 ENCOUNTER — Inpatient Hospital Stay: Payer: 59

## 2020-06-23 ENCOUNTER — Telehealth: Payer: Self-pay | Admitting: *Deleted

## 2020-06-23 DIAGNOSIS — C181 Malignant neoplasm of appendix: Secondary | ICD-10-CM

## 2020-06-23 LAB — CBC
HCT: 24.2 % — ABNORMAL LOW (ref 39.0–52.0)
Hemoglobin: 7.6 g/dL — ABNORMAL LOW (ref 13.0–17.0)
MCH: 30.6 pg (ref 26.0–34.0)
MCHC: 31.4 g/dL (ref 30.0–36.0)
MCV: 97.6 fL (ref 80.0–100.0)
Platelets: 383 10*3/uL (ref 150–400)
RBC: 2.48 MIL/uL — ABNORMAL LOW (ref 4.22–5.81)
RDW: 14.2 % (ref 11.5–15.5)
WBC: 5.1 10*3/uL (ref 4.0–10.5)
nRBC: 0 % (ref 0.0–0.2)

## 2020-06-23 LAB — BASIC METABOLIC PANEL
Anion gap: 8 (ref 5–15)
BUN: 14 mg/dL (ref 8–23)
CO2: 23 mmol/L (ref 22–32)
Calcium: 9.8 mg/dL (ref 8.9–10.3)
Chloride: 102 mmol/L (ref 98–111)
Creatinine, Ser: 0.76 mg/dL (ref 0.61–1.24)
GFR calc Af Amer: >60 mL/min (ref >60)
GFR calc non Af Amer: >60 mL/min (ref >60)
Glucose, Bld: 95 mg/dL (ref 70–99)
Potassium: 4 mmol/L (ref 3.5–5.1)
Sodium: 133 mmol/L — ABNORMAL LOW (ref 135–145)

## 2020-06-23 LAB — AEROBIC/ANAEROBIC CULTURE W GRAM STAIN (SURGICAL/DEEP WOUND)

## 2020-06-23 MED ORDER — CIPROFLOXACIN HCL 500 MG PO TABS
500.0000 mg | ORAL_TABLET | Freq: Two times a day (BID) | ORAL | Status: DC
  Filled 2020-06-23: qty 1

## 2020-06-23 MED ORDER — HEPARIN SOD (PORK) LOCK FLUSH 100 UNIT/ML IV SOLN
500.0000 [IU] | Freq: Once | INTRAVENOUS | Status: AC
  Administered 2020-06-23: 500 [IU] via INTRAVENOUS
  Filled 2020-06-23: qty 5

## 2020-06-23 MED ORDER — CIPROFLOXACIN HCL 500 MG PO TABS: 500.0000 mg | ORAL_TABLET | Freq: Two times a day (BID) | ORAL | 0 refills | Status: DC

## 2020-06-23 MED ORDER — FLUCONAZOLE 200 MG PO TABS: 200.0000 mg | ORAL_TABLET | Freq: Every day | ORAL | 0 refills | Status: DC

## 2020-06-23 NOTE — Plan of Care (Signed)
Pt VS WNL this am.  Pt resting comfortably at this time.   Problem: Clinical Measurements: Goal: Will remain free from infection Outcome: Progressing   Problem: Activity: Goal: Risk for activity intolerance will decrease Outcome: Progressing   Problem: Nutrition: Goal: Adequate nutrition will be maintained Outcome: Progressing   Problem: Pain Managment: Goal: General experience of comfort will improve Outcome: Progressing

## 2020-06-23 NOTE — Discharge Summary (Signed)
Physician Discharge Summary  Juan Richards WYO:[id number] DOB: [birth year] DOA: 06/19/2020  PCP: [name], MD  Admit date: 06/19/2020 Discharge date: 06/23/2020  Admitted From: Home Disposition: Home  Recommendations for Outpatient Follow-up:  1. Follow up with PCP in 1-2 weeks 2. 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 Patient has noted recent chills and aching pain in his left groin, as well as numbness and subjective weakness in the left leg. He underwent CT scan for restaging today and was noted to have an enhancing fluid collection in the left pelvis. He was evaluated by his oncologist for this in the clinic, also noted to have oral candidiasis, and was directed to the hospital for drainage by IR.  Patient met as above with acute ongoing abdominal pain with notable fluid collection in the pelvis, IR was consulted, who was able to successfully place a 10 French pigtail drain with notable frank pus on insertion.  Patient was initiated on IV antibiotics, cultures consistent with E. coli sensitive to Cipro.  The patient was discharged on Cipro for additional 12 days to complete a 2-week course to ensure ongoing infection control, close follow-up with PCP and interventional radiology with pigtail drain in place, patient educated on how to wash and flush this drain, patient otherwise  stable and agreeable for discharge home, close follow-up as outlined above.  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[place])   Appendix carcinoma ([place])   Oral candidiasis    Discharge Instructions  Discharge Instructions    Call MD for:  difficulty breathing, headache or visual disturbances   Complete by: As directed    Call MD for:  extreme fatigue   Complete by: As directed    Call MD for:  hives   Complete by: As directed    Call MD for:  persistant dizziness or light-headedness   Complete by: As directed    Call MD for:  persistant nausea and vomiting   Complete by: As directed    Call MD for:  redness, tenderness, or signs of infection (pain, swelling, redness, odor or green/yellow discharge around incision site)   Complete by: As directed    Call MD for:  severe uncontrolled pain   Complete by: As directed    Call MD for:  temperature >100.4   Complete by: As directed    Diet - low sodium heart healthy   Complete by: As directed    Increase activity slowly   Complete by: As directed    No dressing needed   Complete by: As directed      Allergies as of 06/23/2020   No Known Allergies     Medication List    STOP taking these medications   dexamethasone 4 MG tablet Commonly known as: DECADRON     TAKE these medications   acetaminophen 500 MG tablet Commonly known as: TYLENOL You can take 1000  mg of Tylenol/acetaminophen every 6 hours as needed for pain.  You can buy this over-the-counter at any drugstore.  Do not take more than 4000 mg of Tylenol/acetaminophen per day, it can harm your liver.   ciprofloxacin 500 MG tablet Commonly known as: CIPRO Take 1 tablet (500 mg total) by mouth 2 (two) times daily.   diphenoxylate-atropine 2.5-0.025 MG tablet Commonly known as: LOMOTIL Take 2 tablets by mouth 4 (four) times daily as needed for diarrhea or loose stools.   fluconazole  200 MG tablet Commonly known as: DIFLUCAN Take 1 tablet (200 mg total) by mouth daily. Start taking on: June 24, 2020   lidocaine-prilocaine cream Commonly known as: EMLA Apply 1 application topically as needed.   loperamide 2 MG capsule Commonly known as: IMODIUM Take 2 mg by mouth in the morning, at noon, in the evening, and at bedtime.   magnesium oxide 400 MG tablet Commonly known as: MAG-OX Take 400 mg by mouth daily. Take 1 tablet 2 times daily with food.   ondansetron 8 MG disintegrating tablet Commonly known as: ZOFRAN-ODT Take 8 mg by mouth every 8 (eight) hours as needed for nausea or vomiting.   oxyCODONE 5 MG immediate release tablet Commonly known as: Oxy IR/ROXICODONE Take 1 tablet (5 mg total) by mouth every 4 (four) hours as needed for breakthrough pain (Pain not relieved by plain Tylenol).   pantoprazole 40 MG tablet Commonly known as: PROTONIX Take 1 tablet (40 mg total) by mouth daily.   phenazopyridine 200 MG tablet Commonly known as: PYRIDIUM TAKE 1 TABLET BY MOUTH THREE TIMES DAILY AS NEEDED What changed: reasons to take this   prochlorperazine 10 MG tablet Commonly known as: COMPAZINE Take 1 tablet (10 mg total) by mouth every 6 (six) hours as needed for nausea.            Discharge Care Instructions  (From admission, onward)         Start     Ordered   06/23/20 0000  No dressing needed        06/23/20 1104          No Known Allergies  Consultations:  Interventional radiology, oncology   Procedures/Studies: CT IMAGE GUIDED DRAINAGE BY PERCUTANEOUS CATHETER  Result Date: 06/20/2020 INDICATION: 64 year old male with a history of left lower quadrant fluid collection EXAM: CT GUIDED DRAINAGE OF PELVIC ABSCESS MEDICATIONS: The patient is currently admitted to the hospital and receiving intravenous antibiotics. The antibiotics were administered within an appropriate time frame prior to the initiation of the procedure.  ANESTHESIA/SEDATION: 1.0 mg IV Versed 50 mcg IV Fentanyl Moderate Sedation Time:  20 minutes The patient was continuously monitored during the procedure by the interventional radiology nurse under my direct supervision. COMPLICATIONS: None TECHNIQUE: Informed written consent was obtained from the patient after a thorough discussion of the procedural risks, benefits and alternatives. All questions were addressed. Maximal Sterile Barrier Technique was utilized including caps, mask, sterile gowns, sterile gloves, sterile drape, hand hygiene and skin antiseptic. A timeout was performed prior to the initiation of the procedure. PROCEDURE: The operative field was prepped with Chlorhexidine in a sterile fashion, and a sterile drape was applied covering the operative field. A sterile gown and sterile gloves were used for the procedure. Local anesthesia was provided with 1% Lidocaine. Patient positioned supine position. CT scan was acquired for planning purposes. Once the patient was prepped and draped in the usual sterile fashion, 1% lidocaine was used for local anesthesia. Using CT guidance,  trocar needle was advanced into the fluid collection of the left lower quadrant. Modified Seldinger technique was used to place a 10 [place] drain into the collection. Approximately 30 cc of frank pus was aspirated. Saline wash was performed for a cytology sample. Culture was sent and cytology sample sent. Catheter was sutured in position and attached to bulb suction. Final CT was acquired. Patient tolerated the procedure well and remained hemodynamically stable throughout. No complications were encountered and no significant blood loss. FINDINGS: CT demonstrates ill-defined fluid collection corresponding to the prior contrast-enhanced CT, involving the left ileo psoas musculature. Approximately 30 cc of purulent material aspirated. IMPRESSION: Status post CT-guided drainage of abscess of the left lower quadrant with both cytology  sample and culture sent. Signed, Dulcy Fanny. Dellia Nims, RPVI [facility] Radiology Electronically Signed   By: Corrie Mckusick D.O.   On: 06/20/2020 13:30   CT OUTSIDE FILMS BODY/ABD/PELVIS  Result Date: 06/19/2020 This examination belongs to an outside facility and is stored here for comparison purposes only.  Contact the originating outside institution for any associated report or interpretation.    Subjective: No acute issues or events overnight, patient's main concern this morning is again "inadequate food from the cafeteria" but otherwise denies fevers, chills, nausea, vomiting, diarrhea, constipation, abdominal pain, headache.   Discharge Exam: Vitals:   06/22/20 2231 06/23/20 0603  BP: 109/71 99/67  Pulse: 71 (!) 56  Resp: 20 18  Temp: 98 F (36.7 C) 97.8 F (36.6 C)  SpO2: 100% 100%   Vitals:   06/22/20 0538 06/22/20 1355 06/22/20 2231 06/23/20 0603  BP: 103/64 126/81 109/71 99/67  Pulse: 62 62 71 (!) 56  Resp: '16 18 20 18  '$ Temp: 97.7 F (36.5 C)  98 F (36.7 C) 97.8 F (36.6 C)  TempSrc: Oral  Oral Oral  SpO2: 100% 100% 100% 100%  Weight:    83.5 kg  Height:        General:  Pleasantly resting in bed, No acute distress. HEENT:  Normocephalic atraumatic.  Sclerae nonicteric, noninjected.  Extraocular movements intact bilaterally. Neck:  Without mass or deformity.  Trachea is midline. Lungs:  Clear to auscultate bilaterally without rhonchi, wheeze, or rales. Heart:  Regular rate and rhythm.  Without murmurs, rubs, or gallops. Abdomen:  Soft, nontender, nondistended.  Left-sided ostomy empty, left lower quadrant JP drain empty, postsurgical scars consistent with history. Extremities: Without cyanosis, clubbing, edema, or obvious deformity. Strength globally 5/5 Vascular:  Dorsalis pedis and posterior tibial pulses palpable bilaterally. Skin:  Warm and dry, no erythema, no ulcerations.   The results of significant  diagnostics from this hospitalization (including imaging, microbiology, ancillary and laboratory) are listed below for reference.     Microbiology: Recent Results (from the past 240 hour(s))  SARS Coronavirus 2 by RT PCR (hospital order, performed in [facility] hospital lab) Nasopharyngeal Nasopharyngeal Swab     Status: None   Collection Time: 06/19/20 10:11 PM   Specimen: Nasopharyngeal Swab  Result Value Ref Range Status   SARS Coronavirus 2 NEGATIVE NEGATIVE Final    Comment: (NOTE) SARS-CoV-2 target nucleic acids are NOT DETECTED.  The SARS-CoV-2 RNA is generally detectable in upper and lower respiratory specimens during the acute phase of infection. The lowest concentration of SARS-CoV-2 viral copies this assay can detect is 250 copies / mL. A negative result does not preclude SARS-CoV-2 infection and should not be used as the sole basis for treatment or other patient management decisions.  A negative result may occur with improper specimen collection / handling, submission of specimen other than nasopharyngeal swab, presence of viral mutation(s) within the areas targeted by this assay, and inadequate number of viral copies (<250 copies / mL). A negative result must be combined with clinical observations, patient history, and epidemiological information.  Fact Sheet for Patients:   StrictlyIdeas.no  Fact Sheet for Healthcare Providers: BankingDealers.co.za  This test is not yet approved or  cleared by the [place] FDA and has been authorized for detection and/or diagnosis of SARS-CoV-2 by FDA under an Emergency Use Authorization (EUA).  This EUA will remain in effect (meaning this test can be used) for the duration of the COVID-19 declaration under Section 564(b)(1) of the Act, 21 U.S.C. section 360bbb-3(b)(1), unless the authorization is terminated or revoked sooner.  Performed at [facility], [place]  [address], [place], [place] [address]   Aerobic/Anaerobic Culture (surgical/deep wound)     Status: None (Preliminary result)   Collection Time: 06/20/20 12:45 PM   Specimen: Abscess  Result Value Ref Range Status   Specimen Description   Final    ABSCESS Performed at [place] [address], [place], [place] [address]    Special Requests   Final    NONE Performed at [facility], [place] [address] [place], [place] [address]    Gram Stain   Final    ABUNDANT WBC PRESENT, PREDOMINANTLY PMN MODERATE GRAM POSITIVE COCCI IN PAIRS FEW GRAM NEGATIVE RODS RARE GRAM POSITIVE RODS    Culture   Final    ABUNDANT ESCHERICHIA COLI CULTURE REINCUBATED FOR BETTER GROWTH Performed at [facility], [place] [address], [place], [place] [address]    Report Status PENDING  Incomplete   Organism ID, Bacteria ESCHERICHIA COLI  Final      Susceptibility   Escherichia coli - MIC*    AMPICILLIN >=32 RESISTANT Resistant     CEFAZOLIN <=4 SENSITIVE Sensitive     CEFEPIME <=0.12 SENSITIVE Sensitive     CEFTAZIDIME <=1 SENSITIVE Sensitive     CEFTRIAXONE <=0.25 SENSITIVE Sensitive     CIPROFLOXACIN <=0.25 SENSITIVE Sensitive     GENTAMICIN >=16 RESISTANT Resistant     IMIPENEM <=0.25 SENSITIVE Sensitive     TRIMETH/SULFA >=320 RESISTANT Resistant     AMPICILLIN/SULBACTAM >=32 RESISTANT Resistant     PIP/TAZO <=4 SENSITIVE Sensitive     * ABUNDANT ESCHERICHIA COLI     Labs: BNP (last 3 results) No results for input(s): BNP in the last 8760 hours. Basic Metabolic Panel: Recent Labs  Lab 06/20/20 0812 06/22/20 0628 06/23/20 0630  NA 131* 134* 133*  K 4.4 3.7 4.0  CL 98 104 102  CO2 '23 22 23  '$ GLUCOSE 100* 122* 95  BUN '20 17 14  '$ CREATININE 0.99 0.87 0.76  CALCIUM 10.0 10.0 9.8  MG 1.7  --   --    Liver Function Tests: Recent Labs  Lab 06/20/20 0812  AST 30  ALT 59*  ALKPHOS 155*  BILITOT 0.7  PROT 8.0  ALBUMIN 3.0*   No results for  input(s): LIPASE, AMYLASE in the last 168 hours. No results for input(s): AMMONIA in the last 168 hours. CBC: Recent Labs  Lab 06/20/20 0812 06/22/20 0628 06/22/20 1700 06/23/20 0630  WBC 8.4 5.0  --  5.1  NEUTROABS 6.7  --   --   --   HGB 8.0* 7.6* 7.8* 7.6*  HCT 25.0* 24.5* 24.7* 24.2*  MCV  96.2 97.6  --  97.6  PLT 388 329  --  383   Cardiac Enzymes: No results for input(s): CKTOTAL, CKMB, CKMBINDEX, TROPONINI in the last 168 hours. BNP: Invalid input(s): POCBNP CBG: No results for input(s): GLUCAP in the last 168 hours. D-Dimer No results for input(s): DDIMER in the last 72 hours. Hgb A1c No results for input(s): HGBA1C in the last 72 hours. Lipid Profile No results for input(s): CHOL, HDL, LDLCALC, TRIG, CHOLHDL, LDLDIRECT in the last 72 hours. Thyroid function studies No results for input(s): TSH, T4TOTAL, T3FREE, THYROIDAB in the last 72 hours.  Invalid input(s): FREET3 Anemia work up No results for input(s): VITAMINB12, FOLATE, FERRITIN, TIBC, IRON, RETICCTPCT in the last 72 hours. Urinalysis    Component Value Date/Time   COLORURINE YELLOW 04/29/2020 1252   APPEARANCEUR HAZY (A) 04/29/2020 1252   LABSPEC 1.023 04/29/2020 1252   PHURINE 5.0 04/29/2020 1252   GLUCOSEU NEGATIVE 04/29/2020 1252   GLUCOSEU NEGATIVE 07/22/2009 1428   HGBUR NEGATIVE 04/29/2020 1252   BILIRUBINUR NEGATIVE 04/29/2020 1252   KETONESUR 5 (A) 04/29/2020 1252   PROTEINUR 30 (A) 04/29/2020 1252   UROBILINOGEN 2.0 (H) 07/24/2009 0304   NITRITE NEGATIVE 04/29/2020 1252   LEUKOCYTESUR NEGATIVE 04/29/2020 1252   Sepsis Labs Invalid input(s): PROCALCITONIN,  WBC,  LACTICIDVEN Microbiology Recent Results (from the past 240 hour(s))  SARS Coronavirus 2 by RT PCR (hospital order, performed in [facility] hospital lab) Nasopharyngeal Nasopharyngeal Swab     Status: None   Collection Time: 06/19/20 10:11 PM   Specimen: Nasopharyngeal Swab  Result Value Ref Range Status   SARS Coronavirus 2  NEGATIVE NEGATIVE Final    Comment: (NOTE) SARS-CoV-2 target nucleic acids are NOT DETECTED.  The SARS-CoV-2 RNA is generally detectable in upper and lower respiratory specimens during the acute phase of infection. The lowest concentration of SARS-CoV-2 viral copies this assay can detect is 250 copies / mL. A negative result does not preclude SARS-CoV-2 infection and should not be used as the sole basis for treatment or other patient management decisions.  A negative result may occur with improper specimen collection / handling, submission of specimen other than nasopharyngeal swab, presence of viral mutation(s) within the areas targeted by this assay, and inadequate number of viral copies (<250 copies / mL). A negative result must be combined with clinical observations, patient history, and epidemiological information.  Fact Sheet for Patients:   StrictlyIdeas.no  Fact Sheet for Healthcare Providers: BankingDealers.co.za  This test is not yet approved or  cleared by the [place] FDA and has been authorized for detection and/or diagnosis of SARS-CoV-2 by FDA under an Emergency Use Authorization (EUA).  This EUA will remain in effect (meaning this test can be used) for the duration of the COVID-19 declaration under Section 564(b)(1) of the Act, 21 U.S.C. section 360bbb-3(b)(1), unless the authorization is terminated or revoked sooner.  Performed at [facility], [place] [address], [place], [place] [address]   Aerobic/Anaerobic Culture (surgical/deep wound)     Status: None (Preliminary result)   Collection Time: 06/20/20 12:45 PM   Specimen: Abscess  Result Value Ref Range Status   Specimen Description   Final    ABSCESS Performed at [facility] [address] [place], [place] [address]    Special Requests   Final    NONE Performed at [facility], [place] [address] [place], [place] [address]    Gram Stain   Final  ABUNDANT WBC PRESENT, PREDOMINANTLY PMN MODERATE GRAM POSITIVE COCCI IN PAIRS FEW GRAM NEGATIVE RODS RARE GRAM POSITIVE RODS    Culture   Final    ABUNDANT ESCHERICHIA COLI CULTURE REINCUBATED FOR BETTER GROWTH Performed at [facility], [place] [address] [place], [place] [address]    Report Status PENDING  Incomplete   Organism ID, Bacteria ESCHERICHIA COLI  Final      Susceptibility   Escherichia coli - MIC*    AMPICILLIN >=32 RESISTANT Resistant     CEFAZOLIN <=4 SENSITIVE Sensitive     CEFEPIME <=0.12 SENSITIVE Sensitive     CEFTAZIDIME <=1 SENSITIVE Sensitive     CEFTRIAXONE <=0.25 SENSITIVE Sensitive     CIPROFLOXACIN <=0.25 SENSITIVE Sensitive     GENTAMICIN >=16 RESISTANT Resistant     IMIPENEM <=0.25 SENSITIVE Sensitive     TRIMETH/SULFA >=320 RESISTANT Resistant     AMPICILLIN/SULBACTAM >=32 RESISTANT Resistant     PIP/TAZO <=4 SENSITIVE Sensitive     * ABUNDANT ESCHERICHIA COLI     Time coordinating discharge: Over 30 minutes  SIGNED:   Little Ishikawa, DO [facility] 06/23/2020, 11:05 AM Pager   If 7PM-7AM, please contact night-coverage [email or web address]

## 2020-06-23 NOTE — Progress Notes (Signed)
Pt VS WNL this am.  Discharge instructions provided to and reviewed with pt.  RN completed teaching for JP drain flushes and maintenance.  6E RN deaccessed port.  Pt transported to front lobby via wheelchair by [facility] [place] staff.

## 2020-06-23 NOTE — Telephone Encounter (Signed)
Called for f/u appointment per discharge papers. Per Dr. Benay Spice: needs lab/ov on 07/02/20. Scheduling message sent. Left VM for patient that scheduler will be calling him for this.

## 2020-06-24 ENCOUNTER — Telehealth: Payer: Self-pay | Admitting: *Deleted

## 2020-06-24 LAB — CYTOLOGY - NON PAP

## 2020-06-24 NOTE — Telephone Encounter (Signed)
Called pt to make hosp f/u w/PCP/ Pt states he will be seeing his oncologist Dr. Benay Spice on 07/02/20, and don't need to see both. Will wait to see oncologist next week.Marland KitchenJohny Chess

## 2020-06-25 ENCOUNTER — Telehealth: Payer: Self-pay | Admitting: *Deleted

## 2020-06-25 MED ORDER — CEPHALEXIN 500 MG PO CAPS
500.0000 mg | ORAL_CAPSULE | Freq: Three times a day (TID) | ORAL | 0 refills | Status: DC
Start: 2020-06-25 — End: 2020-06-30

## 2020-06-25 NOTE — Telephone Encounter (Addendum)
Reports ever since started the oral antibiotics fluconazole and cipro his stomach is upset, cramping, N/V and sever heartburn despite the Protonix 40 mg daily. He is taking the compazine prn with minimal relief. Denies any fever, but reports he has not taken his antibiotics today. Per Dr. Benay Spice: May D/C fluconazole. Can change cipro to keflex 500 mg tid. Patient notified. He wants to try to start the cipro again before changing antibiotics. Suggested he take anti-emetic 1 hour before the cipro dose and can also chew Tums prn. He will try this and call back tomorrow if not better.

## 2020-06-26 ENCOUNTER — Emergency Department (HOSPITAL_COMMUNITY): Payer: 59

## 2020-06-26 ENCOUNTER — Encounter (HOSPITAL_COMMUNITY): Payer: Self-pay

## 2020-06-26 ENCOUNTER — Inpatient Hospital Stay (HOSPITAL_COMMUNITY)
Admission: EM | Admit: 2020-06-26 | Discharge: 2020-06-30 | DRG: 375 | Disposition: A | Discharge status: Other Acute Inpatient Hospital | Payer: 59 | Attending: Internal Medicine | Admitting: Internal Medicine

## 2020-06-26 ENCOUNTER — Other Ambulatory Visit: Payer: Self-pay

## 2020-06-26 DIAGNOSIS — Z515 Encounter for palliative care: Secondary | ICD-10-CM | POA: Diagnosis not present

## 2020-06-26 DIAGNOSIS — E860 Dehydration: Secondary | ICD-10-CM | POA: Diagnosis present

## 2020-06-26 DIAGNOSIS — K219 Gastro-esophageal reflux disease without esophagitis: Secondary | ICD-10-CM | POA: Diagnosis present

## 2020-06-26 DIAGNOSIS — Z9221 Personal history of antineoplastic chemotherapy: Secondary | ICD-10-CM | POA: Diagnosis not present

## 2020-06-26 DIAGNOSIS — D638 Anemia in other chronic diseases classified elsewhere: Secondary | ICD-10-CM | POA: Diagnosis present

## 2020-06-26 DIAGNOSIS — Z7189 Other specified counseling: Secondary | ICD-10-CM

## 2020-06-26 DIAGNOSIS — Z20822 Contact with and (suspected) exposure to covid-19: Secondary | ICD-10-CM | POA: Diagnosis present

## 2020-06-26 DIAGNOSIS — C181 Malignant neoplasm of appendix: Principal | ICD-10-CM | POA: Diagnosis present

## 2020-06-26 DIAGNOSIS — Z933 Colostomy status: Secondary | ICD-10-CM | POA: Diagnosis not present

## 2020-06-26 DIAGNOSIS — C762 Malignant neoplasm of abdomen: Secondary | ICD-10-CM | POA: Diagnosis not present

## 2020-06-26 DIAGNOSIS — K56609 Unspecified intestinal obstruction, unspecified as to partial versus complete obstruction: Secondary | ICD-10-CM | POA: Diagnosis present

## 2020-06-26 DIAGNOSIS — B370 Candidal stomatitis: Secondary | ICD-10-CM | POA: Diagnosis present

## 2020-06-26 DIAGNOSIS — N179 Acute kidney failure, unspecified: Secondary | ICD-10-CM | POA: Diagnosis present

## 2020-06-26 DIAGNOSIS — Z9079 Acquired absence of other genital organ(s): Secondary | ICD-10-CM

## 2020-06-26 DIAGNOSIS — N1330 Unspecified hydronephrosis: Secondary | ICD-10-CM | POA: Diagnosis present

## 2020-06-26 DIAGNOSIS — G629 Polyneuropathy, unspecified: Secondary | ICD-10-CM | POA: Diagnosis present

## 2020-06-26 DIAGNOSIS — E876 Hypokalemia: Secondary | ICD-10-CM | POA: Diagnosis not present

## 2020-06-26 DIAGNOSIS — C786 Secondary malignant neoplasm of retroperitoneum and peritoneum: Secondary | ICD-10-CM | POA: Diagnosis present

## 2020-06-26 DIAGNOSIS — Z8546 Personal history of malignant neoplasm of prostate: Secondary | ICD-10-CM

## 2020-06-26 DIAGNOSIS — K651 Peritoneal abscess: Secondary | ICD-10-CM

## 2020-06-26 LAB — LACTIC ACID, PLASMA
Lactic Acid, Venous: 1.1 mmol/L (ref 0.5–1.9)
Lactic Acid, Venous: 1.1 mmol/L (ref 0.5–1.9)

## 2020-06-26 LAB — COMPREHENSIVE METABOLIC PANEL
ALT: 40 U/L (ref 0–44)
AST: 23 U/L (ref 15–41)
Albumin: 3.4 g/dL — ABNORMAL LOW (ref 3.5–5.0)
Alkaline Phosphatase: 132 U/L — ABNORMAL HIGH (ref 38–126)
Anion gap: 8 (ref 5–15)
BUN: 14 mg/dL (ref 8–23)
CO2: 27 mmol/L (ref 22–32)
Calcium: 10.6 mg/dL — ABNORMAL HIGH (ref 8.9–10.3)
Chloride: 102 mmol/L (ref 98–111)
Creatinine, Ser: 1 mg/dL (ref 0.61–1.24)
GFR calc Af Amer: >60 mL/min (ref >60)
GFR calc non Af Amer: >60 mL/min (ref >60)
Glucose, Bld: 115 mg/dL — ABNORMAL HIGH (ref 70–99)
Potassium: 3.6 mmol/L (ref 3.5–5.1)
Sodium: 137 mmol/L (ref 135–145)
Total Bilirubin: 0.4 mg/dL (ref 0.3–1.2)
Total Protein: 8 g/dL (ref 6.5–8.1)

## 2020-06-26 LAB — URINALYSIS, ROUTINE W REFLEX MICROSCOPIC
Bilirubin Urine: NEGATIVE
Glucose, UA: NEGATIVE mg/dL
Hgb urine dipstick: NEGATIVE
Ketones, ur: NEGATIVE mg/dL
Leukocytes,Ua: NEGATIVE
Nitrite: NEGATIVE
Protein, ur: NEGATIVE mg/dL
Specific Gravity, Urine: 1.035 — ABNORMAL HIGH (ref 1.005–1.030)
pH: 5 (ref 5.0–8.0)

## 2020-06-26 LAB — CBC WITH DIFFERENTIAL/PLATELET
Abs Immature Granulocytes: 0.07 10*3/uL (ref 0.00–0.07)
Basophils Absolute: 0.1 10*3/uL (ref 0.0–0.1)
Basophils Relative: 1 %
Eosinophils Absolute: 0.2 10*3/uL (ref 0.0–0.5)
Eosinophils Relative: 2 %
HCT: 28 % — ABNORMAL LOW (ref 39.0–52.0)
Hemoglobin: 8.9 g/dL — ABNORMAL LOW (ref 13.0–17.0)
Immature Granulocytes: 1 %
Lymphocytes Relative: 14 %
Lymphs Abs: 0.9 10*3/uL (ref 0.7–4.0)
MCH: 30.1 pg (ref 26.0–34.0)
MCHC: 31.8 g/dL (ref 30.0–36.0)
MCV: 94.6 fL (ref 80.0–100.0)
Monocytes Absolute: 0.5 10*3/uL (ref 0.1–1.0)
Monocytes Relative: 8 %
Neutro Abs: 4.6 10*3/uL (ref 1.7–7.7)
Neutrophils Relative %: 74 %
Platelets: 483 10*3/uL — ABNORMAL HIGH (ref 150–400)
RBC: 2.96 MIL/uL — ABNORMAL LOW (ref 4.22–5.81)
RDW: 14.4 % (ref 11.5–15.5)
WBC: 6.4 10*3/uL (ref 4.0–10.5)
nRBC: 0 % (ref 0.0–0.2)

## 2020-06-26 LAB — TROPONIN I (HIGH SENSITIVITY)
Troponin I (High Sensitivity): 4 ng/L (ref <18)
Troponin I (High Sensitivity): 4 ng/L (ref <18)

## 2020-06-26 LAB — SARS CORONAVIRUS 2 BY RT PCR (HOSPITAL ORDER, PERFORMED IN ~~LOC~~ HOSPITAL LAB): SARS Coronavirus 2: NEGATIVE

## 2020-06-26 LAB — LIPASE, BLOOD: Lipase: 28 U/L (ref 11–51)

## 2020-06-26 MED ORDER — PROMETHAZINE HCL 25 MG PO TABS: 12.5000 mg | ORAL_TABLET | Freq: Four times a day (QID) | ORAL | Status: DC | PRN

## 2020-06-26 MED ORDER — IOHEXOL 300 MG/ML  SOLN
100.0000 mL | Freq: Once | INTRAMUSCULAR | Status: AC | PRN
  Administered 2020-06-26: 100 mL via INTRAVENOUS

## 2020-06-26 MED ORDER — ONDANSETRON HCL 4 MG/2ML IJ SOLN
4.0000 mg | Freq: Once | INTRAMUSCULAR | Status: AC
  Administered 2020-06-26: 4 mg via INTRAVENOUS
  Filled 2020-06-26: qty 2

## 2020-06-26 MED ORDER — SODIUM CHLORIDE 0.9 % IV BOLUS
2000.0000 mL | Freq: Once | INTRAVENOUS | Status: AC
  Administered 2020-06-26: 2000 mL via INTRAVENOUS

## 2020-06-26 MED ORDER — ACETAMINOPHEN 650 MG RE SUPP: 650.0000 mg | Freq: Four times a day (QID) | RECTAL | Status: DC | PRN

## 2020-06-26 MED ORDER — SODIUM CHLORIDE (PF) 0.9 % IJ SOLN
INTRAMUSCULAR | Status: AC
  Filled 2020-06-26: qty 50

## 2020-06-26 MED ORDER — ENOXAPARIN SODIUM 40 MG/0.4ML ~~LOC~~ SOLN
40.0000 mg | SUBCUTANEOUS | Status: DC
  Administered 2020-06-26 – 2020-06-30 (×5): 40 mg via SUBCUTANEOUS
  Filled 2020-06-26 (×5): qty 0.4

## 2020-06-26 MED ORDER — ACETAMINOPHEN 325 MG PO TABS: 650.0000 mg | ORAL_TABLET | Freq: Four times a day (QID) | ORAL | Status: DC | PRN

## 2020-06-26 MED ORDER — MORPHINE SULFATE (PF) 4 MG/ML IV SOLN
4.0000 mg | INTRAVENOUS | Status: DC | PRN
  Administered 2020-06-26 – 2020-06-29 (×5): 4 mg via INTRAVENOUS
  Filled 2020-06-26 (×5): qty 1

## 2020-06-26 MED ORDER — LACTATED RINGERS IV SOLN: INTRAVENOUS | Status: DC

## 2020-06-26 MED ORDER — CIPROFLOXACIN IN D5W 400 MG/200ML IV SOLN
400.0000 mg | Freq: Two times a day (BID) | INTRAVENOUS | Status: DC
  Administered 2020-06-26 – 2020-06-30 (×9): 400 mg via INTRAVENOUS
  Filled 2020-06-26 (×9): qty 200

## 2020-06-26 MED ORDER — HYDROMORPHONE HCL 1 MG/ML IJ SOLN
1.0000 mg | Freq: Once | INTRAMUSCULAR | Status: AC
  Administered 2020-06-26: 1 mg via INTRAVENOUS
  Filled 2020-06-26: qty 1

## 2020-06-26 MED ORDER — MORPHINE SULFATE (PF) 4 MG/ML IV SOLN
4.0000 mg | Freq: Once | INTRAVENOUS | Status: AC
  Administered 2020-06-26: 4 mg via INTRAVENOUS
  Filled 2020-06-26: qty 1

## 2020-06-26 MED ORDER — SODIUM CHLORIDE 0.9 % IV SOLN: Freq: Once | INTRAVENOUS | Status: AC

## 2020-06-26 MED ORDER — PHENOL 1.4 % MT LIQD
2.0000 | OROMUCOSAL | Status: DC | PRN
  Filled 2020-06-26 (×3): qty 177

## 2020-06-26 MED ORDER — PROMETHAZINE HCL 25 MG/ML IJ SOLN
12.5000 mg | Freq: Four times a day (QID) | INTRAMUSCULAR | Status: DC | PRN
  Administered 2020-06-26 – 2020-06-28 (×3): 12.5 mg via INTRAVENOUS
  Filled 2020-06-26 (×4): qty 1

## 2020-06-26 MED ORDER — PANTOPRAZOLE SODIUM 40 MG IV SOLR
40.0000 mg | INTRAVENOUS | Status: DC
  Administered 2020-06-26 – 2020-06-30 (×5): 40 mg via INTRAVENOUS
  Filled 2020-06-26 (×5): qty 40

## 2020-06-26 MED ORDER — CHLORHEXIDINE GLUCONATE CLOTH 2 % EX PADS
6.0000 | MEDICATED_PAD | Freq: Every day | CUTANEOUS | Status: DC
  Administered 2020-06-27 – 2020-06-29 (×3): 6 via TOPICAL

## 2020-06-26 MED ORDER — SODIUM CHLORIDE 0.9 % IV BOLUS
1000.0000 mL | Freq: Once | INTRAVENOUS | Status: AC
  Administered 2020-06-26: 1000 mL via INTRAVENOUS

## 2020-06-26 MED ORDER — FLUCONAZOLE IN SODIUM CHLORIDE 200-0.9 MG/100ML-% IV SOLN
200.0000 mg | INTRAVENOUS | Status: DC
  Administered 2020-06-26 – 2020-06-29 (×4): 200 mg via INTRAVENOUS
  Filled 2020-06-26 (×5): qty 100

## 2020-06-26 NOTE — Consult Note (Signed)
Juan Richards 10-20-1956  [id number].    Requesting MD: Dr. 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  He has been seeing Dr. Benay Spice with oncology for treatment.  He was referred to Dr. [name] at [facility] to discuss CRS and possibly HIPEC.  The patient was unsure that he wanted any more surgery.  On follow up scan last week, he was noted to have a pelvic abscess.  He was admitted over the weekend for perc drain placement by IR.  He was just discharged 3 days ago.  About 2 days ago he developed worsening abdominal pain with N/V and inability to keep anything down.  His ileostomy is still working and he just emptied it this morning as well as still has semisolid stool present currently.  He presented to the [facility] for evaluation.  He has a CT scan that suggests a SBO in the RLQ likely secondary to adhesive disease as well as disease from his malignancy.  We have been asked to see the patient.  ROS: ROS: Please see HPI, otherwise all other systems have been reviewed and are currently negative.  Family History  Problem Relation Age of Onset   Diabetes Mother    Kidney disease Mother    Cancer Father        ?   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   DIVERTICULITIS, COLON, WITH PERFORATION 07/23/2009   Numbness and tingling    RIGHT TOES AND LEFT LEG DUE TO L 5 DISC    PLEURISY YRS AGO   Prostate cancer ([place]) 2019   prostatectomy   STENOSIS, LUMBAR SPINE 09/27/2007    Past Surgical History:  Procedure Laterality Date    BACK SURGERY  2017   L4 BACK SURGERY   COLON SURGERY Right 01/26/2020   hemicolectomy, ileostomy   COLONOSCOPY     LAPAROTOMY N/A 01/26/2020   Procedure: EXPLORATORY LAPAROTOMY  WITH RIGHT HEMICOLECTOMY,  END ILEOSTOMY, AND MUCUS FISTULA.;  Surgeon: [name], MD;  Location: [facility];  Service: General;  Laterality: N/A;   PELVIC LYMPH NODE DISSECTION Bilateral 09/21/2018   Procedure: BILATERAL PELVIC LYMPH NODE DISSECTION;  Surgeon: Ardis Hughs, MD;  Location: [facility];  Service: Urology;  Laterality: Bilateral;   POLYPECTOMY     PORTACATH PLACEMENT Left 03/10/2020   Procedure: PORT PLACEMENT;  Surgeon: [name], MD;  Location: [facility];  Service: General;  Laterality: Left;   ROBOT ASSISTED LAPAROSCOPIC RADICAL PROSTATECTOMY N/A 09/21/2018   Procedure: XI ROBOTIC ASSISTED LAPAROSCOPIC RADICAL PROSTATECTOMY;  Surgeon: Ardis Hughs, MD;  Location: [facility];  Service: Urology;  Laterality: N/A;    Social History:  reports that he has never smoked. He has never used smokeless tobacco. He reports current alcohol use of about 2.0 - 3.0 standard drinks of alcohol per week. He reports that he does not use drugs.  Allergies: No Known Allergies  (Not in a hospital admission)    Physical Exam: Blood  pressure (!) 135/92, pulse 66, temperature 97.7 F (36.5 C), temperature source Oral, resp. rate 12, height 5\' 10"  (1.778 m), weight 81.2 kg, SpO2 100 %. General: pleasant, WD, WN male male who is laying in bed in NAD HEENT: head is normocephalic, atraumatic.  Sclera are noninjected.  PERRL.  Ears and nose without any masses or lesions.  Mouth is pink and moist Heart: regular, rate, and rhythm.  Normal s1,s2. No obvious murmurs, gallops, or rubs noted.  Palpable radial and pedal pulses bilaterally Lungs: CTAB, no wheezes, rhonchi, or rales noted.  Respiratory effort nonlabored Abd: soft, mild mid abdominal tenderness, ND, +BS, no masses, hernias, or  organomegaly.  He has a RLQ ileostomy with feculent output present.  He also has a mucus fistula with no output.  NGT in place with minimal bile tinged fluid present.  JP drain present with serous drainage MS: all 4 extremities are symmetrical with no cyanosis, clubbing, or edema. Skin: warm and dry with no masses, lesions, or rashes Neuro: Cranial nerves 2-12 grossly intact, sensation is normal throughout Psych: A&Ox3 with an appropriate affect.   Results for orders placed or performed during the hospital encounter of 06/26/20 (from the past 48 hour(s))  CBC with Differential/Platelet     Status: Abnormal   Collection Time: 06/26/20  4:05 AM  Result Value Ref Range   WBC 6.4 4.0 - 10.5 K/uL   RBC 2.96 (L) 4.22 - 5.81 MIL/uL   Hemoglobin 8.9 (L) 13.0 - 17.0 g/dL   HCT 28.0 (L) 39 - 52 %   MCV 94.6 80.0 - 100.0 fL   MCH 30.1 26.0 - 34.0 pg   MCHC 31.8 30.0 - 36.0 g/dL   RDW 14.4 11.5 - 15.5 %   Platelets 483 (H) 150 - 400 K/uL   nRBC 0.0 0.0 - 0.2 %   Neutrophils Relative % 74 %   Neutro Abs 4.6 1.7 - 7.7 K/uL   Lymphocytes Relative 14 %   Lymphs Abs 0.9 0.7 - 4.0 K/uL   Monocytes Relative 8 %   Monocytes Absolute 0.5 0 - 1 K/uL   Eosinophils Relative 2 %   Eosinophils Absolute 0.2 0 - 0 K/uL   Basophils Relative 1 %   Basophils Absolute 0.1 0 - 0 K/uL   Immature Granulocytes 1 %   Abs Immature Granulocytes 0.07 0.00 - 0.07 K/uL    Comment: Performed at [facility], [place] [address] [place], [place] [address]  Comprehensive metabolic panel     Status: Abnormal   Collection Time: 06/26/20  4:05 AM  Result Value Ref Range   Sodium 137 135 - 145 mmol/L   Potassium 3.6 3.5 - 5.1 mmol/L   Chloride 102 98 - 111 mmol/L   CO2 27 22 - 32 mmol/L   Glucose, Bld 115 (H) 70 - 99 mg/dL    Comment: Glucose reference range applies only to samples taken after fasting for at least 8 hours.   BUN 14 8 - 23 mg/dL   Creatinine, Ser 1.00 0.61 - 1.24 mg/dL   Calcium 10.6 (H)  8.9 - 10.3 mg/dL   Total Protein 8.0 6.5 - 8.1 g/dL   Albumin 3.4 (L) 3.5 - 5.0 g/dL   AST 23 15 - 41 U/L   ALT 40 0 - 44 U/L   Alkaline Phosphatase 132 (H) 38 - 126 U/L   Total Bilirubin 0.4 0.3 - 1.2 mg/dL   GFR calc non Af Amer >60 >60 mL/min  GFR calc Af Amer >60 >60 mL/min   Anion gap 8 5 - 15    Comment: Performed at [facility], [place] [address], [place], [place] [address]  Lipase, blood     Status: None   Collection Time: 06/26/20  4:05 AM  Result Value Ref Range   Lipase 28 11 - 51 U/L    Comment: Performed at [facility], [place] [address] [place], [place] [address]  Lactic acid, plasma     Status: None   Collection Time: 06/26/20  4:05 AM  Result Value Ref Range   Lactic Acid, Venous 1.1 0.5 - 1.9 mmol/L    Comment: Performed at [facility], [place] [address] [place], [place] [address]  Troponin I (High Sensitivity)     Status: None   Collection Time: 06/26/20  4:05 AM  Result Value Ref Range   Troponin I (High Sensitivity) 4 <18 ng/L    Comment: (NOTE) Elevated high sensitivity troponin I (hsTnI) values and significant  changes across serial measurements may suggest ACS but many other  chronic and acute conditions are known to elevate hsTnI results.  Refer to the "Links" section for chest pain algorithms and additional  guidance. Performed at [facility], [place] [address], [place], [place] [address]   Lactic acid, plasma     Status: None   Collection Time: 06/26/20  5:45 AM  Result Value Ref Range   Lactic Acid, Venous 1.1 0.5 - 1.9 mmol/L    Comment: Performed at [facility], [place] [address], [place], [place] [address]  Troponin I (High Sensitivity)     Status: None   Collection Time: 06/26/20  5:45 AM  Result Value Ref Range   Troponin I (High Sensitivity) 4 <18 ng/L    Comment: (NOTE) Elevated high sensitivity troponin I (hsTnI) values and significant  changes across  serial measurements may suggest ACS but many other  chronic and acute conditions are known to elevate hsTnI results.  Refer to the "Links" section for chest pain algorithms and additional  guidance. Performed at [facility], [place] [address], [place], [place] [address]    DG Abdomen 1 View  Result Date: 06/26/2020 CLINICAL DATA:  NG tube placement. EXAM: ABDOMEN - 1 VIEW COMPARISON:  CT 06/26/2020. FINDINGS: PowerPort catheter noted with tip over SVC. NG tube noted with tip over the proximal stomach. Side hole at the gastroesophageal junction. Advancement of approximately 10 cm suggested. Contrast from prior CT noted in the renal collecting systems. Bilateral hydronephrosis again noted. IMPRESSION: 1. NG tube noted with tip over the upper stomach. Side hole is at the gastroesophageal junction. NG tube advancement of approximately 10 cm suggested. 2.  Bilateral hydronephrosis again noted. Electronically Signed   By: Marcello Moores  Register   On: 06/26/2020 07:40   CT ABDOMEN PELVIS W CONTRAST  Result Date: 06/26/2020 CLINICAL DATA:  Mid abdominal pain with nausea and vomiting. Decreased colostomy output EXAM: CT ABDOMEN AND PELVIS WITH CONTRAST TECHNIQUE: Multidetector CT imaging of the abdomen and pelvis was performed using the standard protocol following bolus administration of intravenous contrast. CONTRAST:  179mL OMNIPAQUE IOHEXOL 300 MG/ML  SOLN COMPARISON:  06/19/2020 FINDINGS: Lower chest:  No contributory findings. Hepatobiliary: No focal liver abnormality.Increased density in the dependent gallbladder likely reflecting sludge. No calcified stone or regional inflammation. Pancreas: Unremarkable. Spleen: Unremarkable. Adrenals/Urinary Tract: Negative adrenals. Right hydronephrosis and left pelviectasis which is chronic but more prominent than on comparison. The  upper aspect of the bladder has prominent wall thickness, stable and nonspecific. Stomach/Bowel: Right hemicolectomy with  mucous fistula and right abdominal ileostomy. Fluid-filled and dilated loops of bowel with transition in the right abdomen where there is clustering of bowel loops and serosal based indistinctness and sheet like enhancement. Minimal contained fluid in the left upper quadrant peritoneal space. Thick-walled sigmoid colon with multiple diverticula. Presumed diverticulitis with diverticular abscess along the left iliacus which is been completely decompressed by percutaneous catheter. Subhepatic gas appears to be within bowel lumen on coronal reformats. Vascular/Lymphatic: No acute vascular abnormality. No mass or adenopathy. Reproductive:Prostatectomy. Other: Peritoneal findings noted above. Musculoskeletal: No acute abnormalities.  Lumbar spine degeneration. IMPRESSION: 1. Small bowel obstruction with transition at the level of multiple clumped right-sided small bowel loops that is likely related to adhesions and peritoneal disease. 2. Recent percutaneous drainage of a left iliopsoas abscess with resolved fluid collection. 3. Stable right hydronephrosis and left pelviectasis with bladder wall thickening. Electronically Signed   By: Monte Fantasia M.D.   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  Should alert IR of patient's admission so they can follow his drain  Partial small bowel obstruction The patient appears to likely have a partial small bowel obstruction as he is having N/V but he is still also having semi-solid feculent output in his ileostomy pouch.  I discussed with him his findings.  We also discussed the option to discuss tx to [facility] given the offer of CRS and/or HIPEC with Dr. [name].  The patient told Dr. [name] just a week or so ago that he didn't know if he wanted any further surgery, but that he would think about it.  When asked today what he has decided, he says he still hasn't  thought about it.  We discussed that if his obstruction is secondary to his carcinomatosis, he may or may not resolve his partial blockage.  If he was going to have any type of surgery that may help him it would be the one offered at [facility].  We would not offer that type of surgery here.  We discussed a palliative care consult to help him with determining his goals of care as he seems very unsure of how he wants to pursue treatment for himself right now. He is agreeable to this.  I have placed an order.  Repeat films planned for the am.  We will follow along.  FEN - NPO/NGT/ice chips VTE - ok for chemical prophylaxis from our standpoint ID - [id number], for pelvic collection  Henreitta Cea, [facility] Surgery 06/26/2020, 8:51 AM Please see Amion for pager number during day hours 7:00am-4:30pm or 7:00am -11:30am on weekends

## 2020-06-26 NOTE — H&P (Signed)
History and Physical:    Juan Richards   CZY:[id number] DOB: September 07, 1956 DOA: 06/26/2020  Referring MD/provider: Dr. Melanee Left PCP: [name], MD   Patient coming from: Home  Chief Complaint: Decreased ostomy output, increased abdominal pain, nausea and vomiting since yesterday.  History of Present Illness:   Juan Richards is an 64 y.o. male with PMH significant for metastatic appendix cancer with abdominal carcinomatosis who was recently discharged 3 days ago after treatment for pelvic abscess treated with IR drain.  Cultures grew out E. coli sensitive to Cipro and patient was discharged on Cipro.  Patient did well for 2 days after discharge but then developed increasing abdominal pain and noted that his ostomy output had significantly decreased.  Notes he normally has 6-7 bags of output a day and a decrease to 1.  He subsequently developed nausea and had vomiting x3.  Patient was hoping this would resolve on its own however last night his pain got so much worse that he decided to come to the hospital.  Patient denies any fevers or chills as far as he is aware.  Notes he feels miserable and is tired of being so sick.  Main concern is his pain which is somewhat improved with the morphine he just received in the ED.  Continues to have some nausea.  Just feels sick in general.  ED Course:  The patient was noted to be afebrile with otherwise normal vital signs.  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  Fluid collection with drain still in place had resolved.  Patient was discussed with general surgery who recommended admission by [facility].  He was treated with morphine and NG tube placement.  ROS:   ROS   Review of Systems: Patient states he is too tired to go through ROS. Patient specifically does deny chest pain or shortness of breath.  Does admit  to fatigue and weakness.  Past Medical History:   Past Medical History:  Diagnosis Date  . 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 DIVERTICULITIS, COLON, WITH PERFORATION 07/23/2009  . Numbness and tingling    RIGHT TOES AND LEFT LEG DUE TO L 5 DISC   . PLEURISY YRS AGO  . Prostate cancer [facility]) 2019   prostatectomy  . STENOSIS, LUMBAR SPINE 09/27/2007    Past Surgical History:   Past Surgical History:  Procedure Laterality Date  . BACK SURGERY  2017   L4 BACK SURGERY  . COLON SURGERY Right 01/26/2020   hemicolectomy, ileostomy  . COLONOSCOPY    . LAPAROTOMY N/A 01/26/2020   Procedure: EXPLORATORY LAPAROTOMY  WITH RIGHT HEMICOLECTOMY,  END ILEOSTOMY, AND MUCUS FISTULA.;  Surgeon: [name], MD;  [facility];  Service: General;  Laterality: N/A;  . PELVIC LYMPH NODE DISSECTION Bilateral 09/21/2018   Procedure: BILATERAL PELVIC LYMPH NODE DISSECTION;  Surgeon: Ardis Hughs, MD;  Location: [facility];  Service: Urology;  Laterality: Bilateral;  . POLYPECTOMY    . PORTACATH PLACEMENT Left 03/10/2020   Procedure: PORT PLACEMENT;  Surgeon: [name], MD;  Location: [facility];  Service: General;  Laterality: Left;  . ROBOT ASSISTED LAPAROSCOPIC RADICAL PROSTATECTOMY N/A 09/21/2018   Procedure: XI ROBOTIC ASSISTED LAPAROSCOPIC RADICAL PROSTATECTOMY;  Surgeon: Ardis Hughs, MD;  Location: [facility];  Service: Urology;  Laterality: N/A;    Social History:   Social History  Socioeconomic History  . Marital status: Married    Spouse name: Not on file  . Number of children: 1  . Years of education: 91  . Highest education level: Not on file  Occupational History  . Occupation: Production assistant, radio  Tobacco Use  . Smoking status: Never Smoker  . Smokeless tobacco: Never Used  Vaping Use  . Vaping Use: Never used  Substance and Sexual Activity  . Alcohol use: Yes    Alcohol/week: 2.0 - 3.0 standard drinks    Types: 2 - 3 Cans of beer per week     Comment: not now  . Drug use: No  . Sexual activity: Yes  Other Topics Concern  . Not on file  Social History Narrative   Fun: Watch TV   Social Determinants of Health   Financial Resource Strain:   . Difficulty of Paying Living Expenses:   Food Insecurity:   . Worried About Charity fundraiser in the Last Year:   . Arboriculturist in the Last Year:   Transportation Needs:   . Film/video editor (Medical):   Marland Kitchen Lack of Transportation (Non-Medical):   Physical Activity:   . Days of Exercise per Week:   . Minutes of Exercise per Session:   Stress:   . Feeling of Stress :   Social Connections:   . Frequency of Communication with Friends and Family:   . Frequency of Social Gatherings with Friends and Family:   . Attends Religious Services:   . Active Member of Clubs or Organizations:   . Attends Archivist Meetings:   Marland Kitchen Marital Status:   Intimate Partner Violence:   . Fear of Current or Ex-Partner:   . Emotionally Abused:   Marland Kitchen Physically Abused:   . Sexually Abused:     Allergies   Patient has no known allergies.  Family history:   Family History  Problem Relation Age of Onset  . Diabetes Mother   . Kidney disease Mother   . Cancer Father        ?  . Diabetes Maternal Grandmother   . Colon cancer Brother   . Esophageal cancer Neg Hx   . Rectal cancer Neg Hx   . Stomach cancer Neg Hx     Current Medications:   Prior to Admission medications   Medication Sig Start Date End Date Taking? Authorizing Provider  acetaminophen (TYLENOL) 500 MG tablet You can take 1000 mg of Tylenol/acetaminophen every 6 hours as needed for pain.  You can buy this over-the-counter at any drugstore.  Do not take more than 4000 mg of Tylenol/acetaminophen per day, it can harm your liver. Patient taking differently: Take 1,000 mg by mouth every 6 (six) hours as needed.  02/07/20  Yes Earnstine Regal, PA-C  ciprofloxacin (CIPRO) 500 MG tablet Take 1 tablet (500 mg total) by  mouth 2 (two) times daily. 06/25/20  Yes Ladell Pier, MD  fluconazole (DIFLUCAN) 200 MG tablet Take 200 mg by mouth daily. 06/23/20  Yes [provider]  lidocaine-prilocaine (EMLA) cream Apply 1 application topically as needed. Patient taking differently: Apply 1 application topically as needed (port).  03/24/20  Yes Owens Shark, NP  magnesium oxide (MAG-OX) 400 MG tablet Take 400 mg by mouth daily.    Yes [provider]  Multiple Vitamin (MULTIVITAMIN WITH MINERALS) TABS tablet Take 1 tablet by mouth daily.   Yes [provider]  oxyCODONE (OXY IR/ROXICODONE) 5 MG immediate release tablet  Take 1 tablet (5 mg total) by mouth every 4 (four) hours as needed for breakthrough pain (Pain not relieved by plain Tylenol). 03/10/20  Yes [name], MD  pantoprazole (PROTONIX) 40 MG tablet Take 1 tablet (40 mg total) by mouth daily. 02/08/20  Yes Vashti Hey, MD  phenazopyridine (PYRIDIUM) 200 MG tablet TAKE 1 TABLET BY MOUTH THREE TIMES DAILY AS NEEDED 05/29/20  Yes Ladell Pier, MD  prochlorperazine (COMPAZINE) 10 MG tablet Take 1 tablet (10 mg total) by mouth every 6 (six) hours as needed for nausea. 05/29/20  Yes Ladell Pier, MD  cephALEXin (KEFLEX) 500 MG capsule Take 1 capsule (500 mg total) by mouth 3 (three) times daily. 06/25/20   Ladell Pier, MD  diphenoxylate-atropine (LOMOTIL) 2.5-0.025 MG tablet Take 2 tablets by mouth 4 (four) times daily as needed for diarrhea or loose stools. Patient not taking: Reported on 06/26/2020 05/12/20   Alla Feeling, NP  ondansetron (ZOFRAN-ODT) 8 MG disintegrating tablet Take 8 mg by mouth every 8 (eight) hours as needed for nausea or vomiting. Patient not taking: Reported on 06/19/2020    [provider]    Physical Exam:   Vitals:   06/26/20 0339 06/26/20 0400 06/26/20 0500 06/26/20 0728  BP: 129/90 129/84 126/80 (!) 135/92  Pulse: 67 66 62 66  Resp: 13 14 14 12   Temp: 97.7 F (36.5 C)       TempSrc: Oral     SpO2: 100% 100% 100% 100%  Weight: 81.2 kg     Height: 5\' 10"  (1.778 m)        Physical Exam: Blood pressure (!) 135/92, pulse 66, temperature 97.7 F (36.5 C), temperature source Oral, resp. rate 12, height 5\' 10"  (1.778 m), weight 81.2 kg, SpO2 100 %. Gen: Chronically ill-appearing tired gentleman lying in bed with NG tube in place looking miserable. Eyes: sclera anicteric CVS: S1-S2, regular Respiratory:  decreased air entry likely secondary to decreased inspiratory effort GI: Bowel sounds are absent.  Abdomen is soft with 2 ostomy bags in place with minimal amounts of stool.  Minimal tenderness to light palpation.  Abdomen is not particularly distended.  No guarding. LE: No edema. No cyanosis Neuro:  grossly nonfocal.  Psych: patient is logical and coherent, judgement and insight appear normal, mood and affect are depressed and appropriate to situation.     Data Review:    Labs: Basic Metabolic Panel: Recent Labs  Lab 06/20/20 0812 06/22/20 0628 06/23/20 0630 06/26/20 0405  NA 131* 134* 133* 137  K 4.4 3.7 4.0 3.6  CL 98 104 102 102  CO2 23 22 23 27   GLUCOSE 100* 122* 95 115*  BUN 20 17 14 14   CREATININE 0.99 0.87 0.76 1.00  CALCIUM 10.0 10.0 9.8 10.6*  MG 1.7  --   --   --    Liver Function Tests: Recent Labs  Lab 06/20/20 0812 06/26/20 0405  AST 30 23  ALT 59* 40  ALKPHOS 155* 132*  BILITOT 0.7 0.4  PROT 8.0 8.0  ALBUMIN 3.0* 3.4*   Recent Labs  Lab 06/26/20 0405  LIPASE 28   No results for input(s): AMMONIA in the last 168 hours. CBC: Recent Labs  Lab 06/20/20 0812 06/22/20 0628 06/22/20 1700 06/23/20 0630 06/26/20 0405  WBC 8.4 5.0  --  5.1 6.4  NEUTROABS 6.7  --   --   --  4.6  HGB 8.0* 7.6* 7.8* 7.6* 8.9*  HCT 25.0* 24.5* 24.7* 24.2* 28.0*  MCV 96.2 97.6  --  97.6 94.6  PLT 388 329  --  383 483*   Cardiac Enzymes: No results for input(s): CKTOTAL, CKMB, CKMBINDEX, TROPONINI in the last 168 hours.  BNP (last 3  results) No results for input(s): PROBNP in the last 8760 hours. CBG: No results for input(s): GLUCAP in the last 168 hours.  Urinalysis    Component Value Date/Time   COLORURINE YELLOW 04/29/2020 1252   APPEARANCEUR HAZY (A) 04/29/2020 1252   LABSPEC 1.023 04/29/2020 1252   PHURINE 5.0 04/29/2020 1252   GLUCOSEU NEGATIVE 04/29/2020 1252   GLUCOSEU NEGATIVE 07/22/2009 1428   HGBUR NEGATIVE 04/29/2020 1252   BILIRUBINUR NEGATIVE 04/29/2020 1252   KETONESUR 5 (A) 04/29/2020 1252   PROTEINUR 30 (A) 04/29/2020 1252   UROBILINOGEN 2.0 (H) 07/24/2009 0304   NITRITE NEGATIVE 04/29/2020 1252   LEUKOCYTESUR NEGATIVE 04/29/2020 1252      Radiographic Studies: DG Abdomen 1 View  Result Date: 06/26/2020 CLINICAL DATA:  NG tube placement. EXAM: ABDOMEN - 1 VIEW COMPARISON:  CT 06/26/2020. FINDINGS: PowerPort catheter noted with tip over SVC. NG tube noted with tip over the proximal stomach. Side hole at the gastroesophageal junction. Advancement of approximately 10 cm suggested. Contrast from prior CT noted in the renal collecting systems. Bilateral hydronephrosis again noted. IMPRESSION: 1. NG tube noted with tip over the upper stomach. Side hole is at the gastroesophageal junction. NG tube advancement of approximately 10 cm suggested. 2.  Bilateral hydronephrosis again noted. Electronically Signed   By: Marcello Moores  Register   On: 06/26/2020 07:40   CT ABDOMEN PELVIS W CONTRAST  Result Date: 06/26/2020 CLINICAL DATA:  Mid abdominal pain with nausea and vomiting. Decreased colostomy output EXAM: CT ABDOMEN AND PELVIS WITH CONTRAST TECHNIQUE: Multidetector CT imaging of the abdomen and pelvis was performed using the standard protocol following bolus administration of intravenous contrast. CONTRAST:  153mL OMNIPAQUE IOHEXOL 300 MG/ML  SOLN COMPARISON:  06/19/2020 FINDINGS: Lower chest:  No contributory findings. Hepatobiliary: No focal liver abnormality.Increased density in the dependent gallbladder  likely reflecting sludge. No calcified stone or regional inflammation. Pancreas: Unremarkable. Spleen: Unremarkable. Adrenals/Urinary Tract: Negative adrenals. Right hydronephrosis and left pelviectasis which is chronic but more prominent than on comparison. The upper aspect of the bladder has prominent wall thickness, stable and nonspecific. 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 Fluid-filled and dilated loops of bowel with transition in the right abdomen where there is clustering of bowel loops and serosal based indistinctness and sheet like enhancement. Minimal contained fluid in the left upper quadrant peritoneal space. Thick-walled sigmoid colon with multiple diverticula. Presumed diverticulitis with diverticular abscess along the left iliacus which is been completely decompressed by percutaneous catheter. Subhepatic gas appears to be within bowel lumen on coronal reformats. Vascular/Lymphatic: No acute vascular abnormality. No mass or adenopathy. Reproductive:Prostatectomy. Other: Peritoneal findings noted above. Musculoskeletal: No acute abnormalities.  Lumbar spine degeneration. IMPRESSION: 1. Small bowel obstruction with transition at the level of multiple clumped right-sided small bowel loops that is likely related to adhesions and peritoneal disease. 2. Recent percutaneous drainage of a left iliopsoas abscess with resolved fluid collection. 3. Stable right hydronephrosis and left pelviectasis with bladder wall thickening. Electronically Signed   By: Monte Fantasia M.D.   On: 06/26/2020 06:08    EKG: Independently reviewed.  NSR at 70.  Mostly diffusely flattened T waves however he has some unexpected concavity at V3 with biphasic T wave in V4.   Assessment/Plan:   Principal Problem:  SBO (small bowel obstruction) (HCC) Active Problems:   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HCC)   Appendix carcinoma ([place])   Pelvic abscess in male [facility])  Unfortunate  64 year old man with abdominal carcinomatosis secondary to appendix CA presents 2 days after discharge for treatment of pelvic abscess with increasing nausea abdominal pain and decreased ostomy output.  CT consistent with SBO secondary to additions.  SBO Patient discussed with general surgery in the ED, general surgery consult is still pending General surgery is reportedly recommended conservative management with bowel decompression NG tube is in place and is draining some bilious material. Will increase hydration and continue treatment of pain with morphine Plain 2 view abdominal films requested for the morning.  Hypercalcemia Patient is noted to have mild hypercalcemia at 10.6, will treat with aggressive IV fluid resuscitation as he is likely quite dehydrated.  Pelvic abscess Drain is still in place however the abscess appears to have drained. Will start patient back on IV Cipro 400 mg every 12 Drain can be removed while in house  AKI Patient has hydronephrosis which is stable Minimal rise in creatinine should improve with hydration  Slightly abnormal EKG V3 ST segment is somewhat concave with biphasic T waves in V4 Patient denies any chest pain Troponins are negative with initial troponin of 4, stable on repeat.  Anemia Stable, increased H&H likely secondary to intravascular volume depletion  Oral candidiasis Patient was discharged home on fluconazole, will continue with IV fluconazole 200 mg daily x7 days  GERD  Continue pantoprazole   Other information:   DVT prophylaxis: Lovenox ordered. Code Status: Full Family Communication: Spoke with patient's wife and daughter over the phone while in the room with the patient Disposition Plan: Home Consults called: General surgery Admission status: Inpatient  Aylana Hirschfeld Tublu Zaidee Rion [facility]  If 7PM-7AM, please contact night-coverage [email or web address] Password [email or web address] 06/26/2020, 8:10 AM

## 2020-06-26 NOTE — ED Notes (Signed)
Transport called.

## 2020-06-26 NOTE — ED Provider Notes (Signed)
[facility] DEPT Provider Note   CSN: [id number] Arrival date & time: 06/26/20  0241     History Chief Complaint  Patient presents with  . Abdominal Pain    Juan Richards is a 64 y.o. male.  Juan Richards is a 64 y.o. 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  Currently on Cipro antibiotics.  Reports over the past 1 day he has had progressively worsening epigastric and diffuse abdominal pain with nausea, vomiting, inability to tolerate p.o. with decreased ostomy output.  States he was discharged in the hospital on July 6.  Still has a drain in place to his pelvic abscess.  Over the past 1 day he had increasing epigastric pain with decreased ostomy output and multiple concerns of nausea and vomiting.  States he cannot keep any of his medicines down.  Only emptied his bag 1 time yesterday with normally 6 or 8 times.  Denies fever.  Denies chest pain or shortness of breath.  Denies pain with urination or blood in the urine.  The history is provided by the patient.  Abdominal Pain Associated symptoms: constipation, nausea and vomiting   Associated symptoms: no cough, no dysuria, no fever, no hematuria and no shortness of breath        Past Medical History:  Diagnosis Date  . 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 DIVERTICULITIS, COLON, WITH PERFORATION 07/23/2009  . Numbness and tingling    RIGHT TOES AND LEFT LEG DUE TO L 5 DISC   . PLEURISY YRS AGO  . Prostate cancer [facility]) 2019   prostatectomy  . 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 Pelvic abscess in male [facility]) 06/19/2020  . Oral candidiasis 06/19/2020  . Chronic diarrhea 04/28/2020  . Port-A-Cath in place 03/31/2020  . Appendix carcinoma ([place])  03/02/2020  . Protein-calorie malnutrition, moderate ([place]) 02/01/2020  . 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 Acute blood loss anemia 01/29/2020  . Hyponatremia 01/29/2020  . Goals of care, counseling/discussion   . Palliative care by specialist   . Bowel perforation ([place]) 01/27/2020  . Sepsis ([place]) 01/26/2020  . Neutropenia with fever ([place]) 01/26/2020  . 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 Diverticulitis of large intestine with perforation 01/25/2020  . SBO (small bowel obstruction) ([place]) 01/25/2020  . Prostate cancer ([place]) 09/21/2018  . Pre-diabetes 06/20/2018  . Routine general medical examination at a health care facility 08/11/2016  . Gross hematuria 07/23/2009  . UTI (urinary tract infection) 09/27/2007  . 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 EPIDIDYMAL CYST 08/31/2007    Past Surgical History:  Procedure Laterality Date  . BACK SURGERY  2017   L4 BACK SURGERY  . COLON SURGERY Right 01/26/2020   hemicolectomy, ileostomy  . COLONOSCOPY    . LAPAROTOMY N/A 01/26/2020   Procedure: EXPLORATORY LAPAROTOMY  WITH RIGHT HEMICOLECTOMY,  END ILEOSTOMY, AND MUCUS FISTULA.;  Surgeon: [name], MD;  Location: [facility];  Service: General;  Laterality: N/A;  . PELVIC LYMPH NODE DISSECTION Bilateral 09/21/2018   Procedure: BILATERAL PELVIC LYMPH NODE DISSECTION;  Surgeon: Ardis Hughs, MD;  Location: [facility];  Service: Urology;  Laterality: Bilateral;  . POLYPECTOMY    . PORTACATH PLACEMENT Left 03/10/2020   Procedure: PORT PLACEMENT;  Surgeon: [name], MD;  Location: [facility];  Service: General;  Laterality: Left;  . ROBOT ASSISTED LAPAROSCOPIC RADICAL PROSTATECTOMY N/A 09/21/2018   Procedure: XI ROBOTIC ASSISTED LAPAROSCOPIC RADICAL PROSTATECTOMY;  Surgeon: Ardis Hughs, MD;  Location: [facility];  Service: Urology;  Laterality: N/A;       Family History  Problem Relation Age of Onset  . Diabetes Mother   . Kidney disease Mother   . Cancer  Father        ?  . Diabetes Maternal Grandmother   . Colon cancer Brother   . Esophageal cancer Neg Hx   . Rectal cancer Neg Hx   . Stomach cancer Neg Hx     Social History   Tobacco Use  . Smoking status: Never Smoker  . Smokeless tobacco: Never Used  Vaping Use  . Vaping Use: Never used  Substance Use Topics  . Alcohol use: Yes    Alcohol/week: 2.0 - 3.0 standard drinks    Types: 2 - 3 Cans of beer per week    Comment: not now  . Drug use: No    Home Medications Prior to Admission medications   Medication Sig Start Date End Date Taking? Authorizing Provider  acetaminophen (TYLENOL) 500 MG tablet You can take 1000 mg of Tylenol/acetaminophen every 6 hours as needed for pain.  You can buy this over-the-counter at any drugstore.  Do not take more than 4000 mg of Tylenol/acetaminophen per day, it can harm your liver. 02/07/20   Earnstine Regal, PA-C  cephALEXin (KEFLEX) 500 MG capsule Take 1 capsule (500 mg total) by mouth 3 (three) times daily. 06/25/20   Ladell Pier, MD  ciprofloxacin (CIPRO) 500 MG tablet Take 1 tablet (500 mg total) by mouth 2 (two) times daily. 06/25/20   Ladell Pier, MD  diphenoxylate-atropine (LOMOTIL) 2.5-0.025 MG tablet Take 2 tablets by mouth 4 (four) times daily as needed for diarrhea or loose stools. 05/12/20   Alla Feeling, NP  lidocaine-prilocaine (EMLA) cream Apply 1 application topically as needed. 03/24/20   Owens Shark, NP  loperamide (IMODIUM) 2 MG capsule Take 2 mg by mouth in the morning, at noon, in the evening, and at bedtime. Patient not taking: Reported on 06/19/2020    [provider]  magnesium oxide (MAG-OX) 400 MG tablet Take 400 mg by mouth daily. Take 1 tablet 2 times daily with food.     [provider]  ondansetron (ZOFRAN-ODT) 8 MG disintegrating tablet Take 8 mg by mouth every 8 (eight) hours as needed for nausea or vomiting. Patient not taking: Reported on 06/19/2020    [provider]  oxyCODONE  (OXY IR/ROXICODONE) 5 MG immediate release tablet Take 1 tablet (5 mg total) by mouth every 4 (four) hours as needed for breakthrough pain (Pain not relieved by plain Tylenol). 03/10/20   [name], MD  pantoprazole (PROTONIX) 40 MG tablet Take 1 tablet (40 mg total) by mouth daily. 02/08/20   Vashti Hey, MD  phenazopyridine (PYRIDIUM) 200 MG tablet TAKE 1 TABLET BY MOUTH THREE TIMES DAILY AS NEEDED Patient taking differently: Take 200 mg by mouth 3 (three) times daily as needed for pain.  05/29/20   Ladell Pier, MD  prochlorperazine (COMPAZINE) 10 MG tablet Take 1 tablet (10 mg total) by mouth every 6 (six) hours as needed for nausea. 05/29/20   Ladell Pier, MD    Allergies    Patient has no known allergies.  Review of Systems   Review of Systems  Constitutional: Positive for activity change and appetite change. Negative for fever.  HENT: Negative for congestion and rhinorrhea.   Eyes: Negative for visual disturbance.  Respiratory: Negative for cough, chest tightness and shortness of breath.   Gastrointestinal: Positive for abdominal pain, constipation, nausea and vomiting.  Genitourinary: Negative for dysuria and hematuria.  Musculoskeletal: Negative for arthralgias and myalgias.  Neurological: Negative for dizziness, weakness and headaches.   all other systems are negative except as noted in the HPI and PMH.    Physical Exam Updated Vital Signs BP (!) 135/98 (BP Location: Left Arm)   Pulse (!) 110   Temp 98.6 F (37 C) (Oral)   Resp 14   Ht 5\' 9"  (1.753 m)   Wt 81.2 kg   SpO2 100%   BMI 26.43 kg/m   Physical Exam Vitals and nursing note reviewed.  Constitutional:      General: He is not in acute distress.    Appearance: He is well-developed.     Comments: Chronically ill-appearing  HENT:     Head: Normocephalic and atraumatic.     Mouth/Throat:     Pharynx: No oropharyngeal exudate.  Eyes:     Conjunctiva/sclera: Conjunctivae normal.      Pupils: Pupils are equal, round, and reactive to light.  Neck:     Comments: No meningismus. Cardiovascular:     Rate and Rhythm: Regular rhythm. Tachycardia present.     Heart sounds: Normal heart sounds. No murmur heard.   Pulmonary:     Effort: Pulmonary effort is normal. No respiratory distress.     Breath sounds: Normal breath sounds.  Abdominal:     Palpations: Abdomen is soft.     Tenderness: There is abdominal tenderness. There is no guarding or rebound.     Comments: Multiple surgical scars.  Epigastric tenderness with voluntary guarding.  Colostomy with brown stool, ileostomy on the left side Left lower quadrant pelvic drain in place with JP drain  Musculoskeletal:        General: No tenderness. Normal range of motion.     Cervical back: Normal range of motion and neck supple.  Skin:    General: Skin is warm.     Capillary Refill: Capillary refill takes less than 2 seconds.  Neurological:     General: No focal deficit present.     Mental Status: He is alert and oriented to person, place, and time. Mental status is at baseline.     Cranial Nerves: No cranial nerve deficit.     Motor: No abnormal muscle tone.     Coordination: Coordination normal.     Comments: No ataxia on finger to nose bilaterally. No pronator drift. 5/5 strength throughout. CN 2-12 intact.Equal grip strength. Sensation intact.   Psychiatric:        Behavior: Behavior normal.     ED Results / Procedures / Treatments   Labs (all labs ordered are listed, but only abnormal results are displayed) Labs Reviewed  CBC WITH DIFFERENTIAL/PLATELET - Abnormal; Notable for the following components:      Result Value   RBC 2.96 (*)    Hemoglobin 8.9 (*)    HCT 28.0 (*)    Platelets 483 (*)    All other components within normal limits  COMPREHENSIVE METABOLIC PANEL - Abnormal; Notable for the following components:   Glucose, Bld 115 (*)    Calcium 10.6 (*)    Albumin 3.4 (*)    Alkaline Phosphatase 132  (*)    All  other components within normal limits  SARS CORONAVIRUS 2 BY RT PCR (HOSPITAL ORDER, [facility])  LIPASE, BLOOD  LACTIC ACID, PLASMA  LACTIC ACID, PLASMA  URINALYSIS, ROUTINE W REFLEX MICROSCOPIC  TROPONIN I (HIGH SENSITIVITY)  TROPONIN I (HIGH SENSITIVITY)    EKG EKG Interpretation  Date/Time:  Friday June 26 2020 03:38:59 EDT Ventricular Rate:  68 PR Interval:    QRS Duration: 70 QT Interval:  321 QTC Calculation: 342 R Axis:   72 Text Interpretation: Sinus rhythm Borderline T wave abnormalities No significant change was found Confirmed by Ezequiel Essex [phone]) on 06/26/2020 3:49:38 AM   Radiology CT ABDOMEN PELVIS W CONTRAST  Result Date: 06/26/2020 CLINICAL DATA:  Mid abdominal pain with nausea and vomiting. Decreased colostomy output EXAM: CT ABDOMEN AND PELVIS WITH CONTRAST TECHNIQUE: Multidetector CT imaging of the abdomen and pelvis was performed using the standard protocol following bolus administration of intravenous contrast. CONTRAST:  134mL OMNIPAQUE IOHEXOL 300 MG/ML  SOLN COMPARISON:  06/19/2020 FINDINGS: Lower chest:  No contributory findings. Hepatobiliary: No focal liver abnormality.Increased density in the dependent gallbladder likely reflecting sludge. No calcified stone or regional inflammation. Pancreas: Unremarkable. Spleen: Unremarkable. Adrenals/Urinary Tract: Negative adrenals. Right hydronephrosis and left pelviectasis which is chronic but more prominent than on comparison. The upper aspect of the bladder has prominent wall thickness, stable and nonspecific. 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 Fluid-filled and dilated loops of bowel with transition in the right abdomen where there is clustering of bowel loops and serosal based indistinctness and sheet like enhancement. Minimal contained fluid in the left upper quadrant peritoneal space. Thick-walled sigmoid colon with multiple  diverticula. Presumed diverticulitis with diverticular abscess along the left iliacus which is been completely decompressed by percutaneous catheter. Subhepatic gas appears to be within bowel lumen on coronal reformats. Vascular/Lymphatic: No acute vascular abnormality. No mass or adenopathy. Reproductive:Prostatectomy. Other: Peritoneal findings noted above. Musculoskeletal: No acute abnormalities.  Lumbar spine degeneration. IMPRESSION: 1. Small bowel obstruction with transition at the level of multiple clumped right-sided small bowel loops that is likely related to adhesions and peritoneal disease. 2. Recent percutaneous drainage of a left iliopsoas abscess with resolved fluid collection. 3. Stable right hydronephrosis and left pelviectasis with bladder wall thickening. Electronically Signed   By: Monte Fantasia M.D.   On: 06/26/2020 06:08    Procedures Procedures (including critical care time)  Medications Ordered in ED Medications  sodium chloride 0.9 % bolus 1,000 mL (has no administration in time range)  ondansetron (ZOFRAN) injection 4 mg (has no administration in time range)  morphine 4 MG/ML injection 4 mg (has no administration in time range)    ED Course  I have reviewed the triage vital signs and the nursing notes.  Pertinent labs & imaging results that were available during my care of the patient were reviewed by me and considered in my medical decision making (see chart for details).    MDM Rules/Calculators/A&P                         Patient with recent pelvic abscess and multiple previous abdominal surgeries here with increasing pain, nausea vomiting and decreased ostomy output.  Mild tachycardia.  We will hydrate, check labs, will need CT scan to evaluate for bowel obstruction versus worsening abscess versus other pathology  Labs show stable anemia.  Hypercalcemia.  Tachycardia has improved.  CT scan obtained given his previous multiple surgeries and recent pelvic  abscess.  There is a small bowel obstruction involving the right side of the abdomen.  His pelvic fluid collection appears to be improved.  Discussed with Dr. Harlow Asa of general surgery.  They will consult on patient but is requesting medical admission.  We will hydrate, NG tube and pain control.  Admission d/w Dr. Alcario Drought.  Final Clinical Impression(s) / ED Diagnoses Final diagnoses:  Small bowel obstruction [facility])    Rx / DC Orders ED Discharge Orders    None       Naomy Esham, Annie Main, MD 06/26/20 [phone]

## 2020-06-26 NOTE — ED Triage Notes (Signed)
Arrived POV from home. Patient reports mid abdominal pain, nausea, and vomiting. Patient states when he eats it does not feel like his food is digesting, because his colostomy bag is not filling up like it normally does.

## 2020-06-26 NOTE — Progress Notes (Signed)
Called to inform RN that the room was still being clean at the time of report about an hour ago. Pt has not yet arrived to the room. Tried to call to follow-up on pt but nobody picked up.

## 2020-06-26 NOTE — Consult Note (Signed)
Consultation Note Date: 06/26/2020   Patient Name: Juan Richards  DOB: 20-Oct-1956  MRN: [id number]  Age / Sex: 64 y.o., male  PCP: Juan Borg, MD Referring Physician: Oren [facility]*  Reason for Consultation: Establishing goals of care  HPI/Patient Profile: 64 y.o. 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   General surgery consulted - recommending conservative management with bowel decompression.  He has been offered [facility] treatment at [facility] - per consult he is unsure if he wants further surgeries - GOC discussion requested.  Recent hospitalization from 7/2-06/23/20 for pelvic abscess treated with IR drainage.   Patient faces treatment option decisions, advanced directive decisions, and anticipatory care needs.  Clinical Assessment and Goals of Care: I have reviewed medical records including EPIC notes, labs and imaging, received report from primary RN, assessed the patient and then met with patient  to discuss diagnosis, prognosis, GOC, EOL wishes, disposition, and options.  Went to visit patient at bedside - no family at bedside. He was lying in bed awake, alert, and oriented. He was able to participate in conversation. He denied having any current pain or nausea - he stated the medications he was given helped.  I introduced Palliative Medicine as specialized medical care for people living with serious illness. It focuses on providing relief from the symptoms and stress of a serious illness. The goal is to improve quality of life for both the patient and the family.  We discussed a brief life review of the patient. 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 He is married and considers himself a spiritual  person. He has undergone 38 radiation treatments in the past as well as 5 chemotherapy treatments. Juan Richards lives at home with his wife. He had an ileostomy/resection surgery in February 2021 and wore a wound vac from February - May, which he states was very painful and stressful.  As far as functional and nutritional status, he was completely independent prior to hospitalization. He was able to eat a normal diet up until about yesterday when he started feeling very nauseated and began vomiting. He also noticed his ostomy output had decreased - he normally empties it 8-10 times per day and he only emptied it once. Also of note, he stated his left leg prior to his previous hospitalization a week ago was painful and made it hard to walk. Since the drain was placed he is now able to move his leg without it being as painful, which improved his functionality.  We discussed patient's current illness and what it means in the larger context of patient's on-going co-morbidities. Natural disease trajectory was discussed. Discussion was had around his SBO in context of his previous surgeries in February as well as his appendiceal cancer.   I attempted to elicit values and goals of care important to the patient. He states what is important for him is that physicians listen to him and his concerns. The difference between aggressive medical intervention and comfort care was considered in light of the patient's goals of care. Treating the SBO here at [facility] vs deciding to proceed with the [facility] surgery at [facility] was also discussed in length. He would like time to discuss these options with his wife. [facility] stated he was not able to speak with her in depth  previously about the surgical decision because of how sick he felt.   [facility] had a couple of questions about SBOs - education was provided.  He stated his throat was very sore from the NGT. Educated that there were medications that could be given to help if  he was interested. He was agreeable to chloraseptic spray.  Advance directives and concepts specific to code status were considered and discussed. Juan Richards stated he does not have a living will or HCPOA, but he had been in contact with a lawyer to try and get these documents completed. He would like his wife/[facility] to make medical decisions on his behalf if he is unable to make them himself. [facility] stated he wants to remain full code status at this time - he wants attempt at resuscitation "at least once."  [facility] services were offered and patient accepted for spiritual support and to complete advanced directive documents.   Discussed with patient the importance of continued conversation with family and the medical providers regarding overall plan of care and treatment options, ensuring decisions are within the context of the patient's values and GOCs.    Questions and concerns were addressed. The family was encouraged to call with questions or concerns.   Thank you for the consult.  Primary Decision Maker NEXT OF KIN Wife/Juan Richards  SUMMARY OF RECOMMENDATIONS   -Continue current medical treatment -Continue full code status -Patient would like time to discuss [facility] surgery at [facility] with his wife. He is leaning toward getting transferred for the procedure, but would like time to discuss with her before making a final decision. -Chloraseptic spray PRN ordered for mouth/throat discomfort -[facility] consulted for spiritual support and to complete advanced directives - wife to be medical decision maker   Code Status/Advance Care Planning:  Full code   Symptom Management:   Sore throat - chloraseptic mouth/throat spray PRN  Palliative Prophylaxis:   Frequent Pain Assessment  Additional Recommendations (Limitations, Scope, Preferences):  Full Scope Treatment  Psycho-social/Spiritual:   Desire for further Chaplaincy support:yes  Created space and  opportunity for patient to express thoughts and feelings regarding patient's current medical situation.  Emotional support and therapeutic listening offered.  Prognosis:   Unable to determine  Discharge Planning: To Be Determined      Primary Diagnoses: Present on Admission: . SBO (small bowel obstruction) (HCC) . 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HCC) . Appendix carcinoma (HCC) . Pelvic abscess in male [facility])   I have reviewed the medical record, interviewed the patient and family, and examined the patient. The following aspects are pertinent.  Past Medical History:  Diagnosis Date  . 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 DIVERTICULITIS, COLON, WITH PERFORATION 07/23/2009  . Numbness and tingling    RIGHT TOES AND LEFT LEG DUE TO L 5 DISC   . PLEURISY YRS AGO  . Prostate cancer [facility]) 2019   prostatectomy  . 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 Marital status: Married    Spouse name: Not on file  . Number of children: 1  . Years of education: 27  . Highest education level: Not on file  Occupational History  . Occupation: Chief Strategy Officer  Tobacco Use  . Smoking status: Never Smoker  . Smokeless tobacco: Never Used  Vaping Use  . Vaping Use: Never used  Substance and Sexual Activity  . Alcohol use: Yes    Alcohol/week: 2.0 - 3.0 standard drinks    Types: 2 -  3 Cans of beer per week    Comment: not now  . Drug use: No  . Sexual activity: Yes  Other Topics Concern  . Not on file  Social History Narrative   Fun: Watch TV   Social Determinants of Health   Financial Resource Strain:   . Difficulty of Paying Living Expenses:   Food Insecurity:   . Worried About Charity fundraiser in the Last Year:   . Arboriculturist in the Last Year:   Transportation Needs:   . Film/video editor (Medical):   Marland Kitchen Lack of Transportation (Non-Medical):   Physical Activity:   . Days of Exercise per Week:   . Minutes of Exercise  per Session:   Stress:   . Feeling of Stress :   Social Connections:   . Frequency of Communication with Friends and Family:   . Frequency of Social Gatherings with Friends and Family:   . Attends Religious Services:   . Active Member of Clubs or Organizations:   . Attends Archivist Meetings:   Marland Kitchen Marital Status:    Family History  Problem Relation Age of Onset  . Diabetes Mother   . Kidney disease Mother   . Cancer Father        ?  . Diabetes Maternal Grandmother   . Colon cancer Brother   . Esophageal cancer Neg Hx   . Rectal cancer Neg Hx   . Stomach cancer Neg Hx    Scheduled Meds: . enoxaparin (LOVENOX) injection  40 mg Subcutaneous Q24H  . pantoprazole (PROTONIX) IV  40 mg Intravenous Q24H  . sodium chloride (PF)       Continuous Infusions: . ciprofloxacin    . fluconazole (DIFLUCAN) IV 200 mg (06/26/20 0923)  . lactated ringers     PRN Meds:.acetaminophen **OR** acetaminophen, morphine injection, promethazine No Known Allergies Review of Systems  Constitutional: Positive for appetite change.  Respiratory: Negative for shortness of breath.   Gastrointestinal: Negative for abdominal pain, nausea and vomiting.    Physical Exam Pulmonary:     Effort: Pulmonary effort is normal. No respiratory distress.  Skin:    General: Skin is warm and dry.  Neurological:     Mental Status: He is alert.  Psychiatric:        Behavior: Behavior is cooperative.        Cognition and Memory: Cognition normal.     Vital Signs: BP 131/86   Pulse 65   Temp 97.7 F (36.5 C) (Oral)   Resp 16   Ht '5\' 10"'$  (1.778 m)   Wt 81.2 kg   SpO2 100%   BMI 25.68 kg/m  Pain Scale: 0-10   Pain Score: 3    SpO2: SpO2: 100 % O2 Device:SpO2: 100 % O2 Flow Rate: .   IO: Intake/output summary: No intake or output data in the 24 hours ending 06/26/20 1003  LBM:   Baseline Weight: Weight: 81.2 kg Most recent weight: Weight: 81.2 kg     Palliative Assessment/Data: PPS 80%  due to intake and current SBO   Discussed case with primary RN, patient, Dr. Jamse Arn  Time In: 1100 Time Out: 1210 Time Total: 70 minutes Greater than 50%  of this time was spent counseling and coordinating care related to the above assessment and plan.  [facility], [place]

## 2020-06-26 NOTE — ED Notes (Signed)
NG tube advanced 10 cm per abdominal xray

## 2020-06-26 NOTE — Progress Notes (Signed)
Pt just arrived to room 1532 from ED. Pt is washing up in the bathroom.

## 2020-06-26 NOTE — Progress Notes (Signed)
[facility] was sitting on the side of the bed, back towards the door, when I arrived. He did not turn around but said it was not a good time for a visit. I told him he could request a visit later through his nurse if he would like. Please page if assistance is needed. Newborn, MDiv   06/26/20 1800  Clinical Encounter Type  Visited With Patient

## 2020-06-27 ENCOUNTER — Inpatient Hospital Stay (HOSPITAL_COMMUNITY): Payer: 59

## 2020-06-27 LAB — COMPREHENSIVE METABOLIC PANEL
ALT: 26 U/L (ref 0–44)
AST: 17 U/L (ref 15–41)
Albumin: 2.7 g/dL — ABNORMAL LOW (ref 3.5–5.0)
Alkaline Phosphatase: 102 U/L (ref 38–126)
Anion gap: 8 (ref 5–15)
BUN: 13 mg/dL (ref 8–23)
CO2: 24 mmol/L (ref 22–32)
Calcium: 9.8 mg/dL (ref 8.9–10.3)
Chloride: 107 mmol/L (ref 98–111)
Creatinine, Ser: 0.99 mg/dL (ref 0.61–1.24)
GFR calc Af Amer: >60 mL/min (ref >60)
GFR calc non Af Amer: >60 mL/min (ref >60)
Glucose, Bld: 91 mg/dL (ref 70–99)
Potassium: 3.6 mmol/L (ref 3.5–5.1)
Sodium: 139 mmol/L (ref 135–145)
Total Bilirubin: 0.5 mg/dL (ref 0.3–1.2)
Total Protein: 6.7 g/dL (ref 6.5–8.1)

## 2020-06-27 LAB — CBC
HCT: 25 % — ABNORMAL LOW (ref 39.0–52.0)
Hemoglobin: 7.9 g/dL — ABNORMAL LOW (ref 13.0–17.0)
MCH: 30.9 pg (ref 26.0–34.0)
MCHC: 31.6 g/dL (ref 30.0–36.0)
MCV: 97.7 fL (ref 80.0–100.0)
Platelets: 378 10*3/uL (ref 150–400)
RBC: 2.56 MIL/uL — ABNORMAL LOW (ref 4.22–5.81)
RDW: 14.4 % (ref 11.5–15.5)
WBC: 4.5 10*3/uL (ref 4.0–10.5)
nRBC: 0 % (ref 0.0–0.2)

## 2020-06-27 MED ORDER — SODIUM CHLORIDE 0.9% FLUSH
10.0000 mL | Freq: Two times a day (BID) | INTRAVENOUS | Status: DC
  Administered 2020-06-27 – 2020-06-29 (×2): 10 mL

## 2020-06-27 MED ORDER — SODIUM CHLORIDE 0.9% FLUSH: 10.0000 mL | INTRAVENOUS | Status: DC | PRN

## 2020-06-27 NOTE — Progress Notes (Signed)
PROGRESS NOTE    Juan Richards  XBJ:[id number] DOB: 07-15-1956 DOA: 06/26/2020 PCP: [name], MD   Chef Complaints: Terminal pain  Brief Narrative: Per admitting 64 y.o. male with PMH significant for metastatic appendix cancer with abdominal carcinomatosis who was admitted on 06/19/20 for pelvic abscess treated with IR drain,cultures grew out E. coli sensitive to Cipro and patient was discharged on Cipro on 06/23/20 FOR 12 MORE DAYS. He returned to the ED after noting significant decrease in his ostomy output.  Normally at 6-7 bags of output a day and decreased to 1.  With associated nausea and vomiting. ED Course:  The patient was noted to be afebrile with otherwise normal vital signs.  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  Fluid collection with drain still in place had resolved. Patient was discussed with general surgery who recommended admission by [facility].  He was treated with morphine and NG tube placement.  Subjective: No new complaints.  NG tube in place with minimal output, denies nausea vomiting.  Pain is controlled.  Some stool output in the right colostomy.   Assessment & Plan:  SBO: secondary to abdominal carcinomatosis:Surgery following on NG tube repeat x-ray ordered for the morning.Surgery saw and recommended transfer to [facility]. Patient reluctant for transfer I told him that sometimes admitted few more days it will be prudent to make a call and initiate transfer, he understand that if he needs surgery it has to be done at [facility] for the surgeon recommendation here.  We will keep on IV fluids.cont pain control. He saw Dr [name] recently at [facility]. ( per surgery there's offer of CRS and/or HIPEC with Dr. [name] and consider transfer". Pt is hoping he would not need surgery, if needed he will have it at [facility].I did reach out to [facility] transfer  line [phone] - left msg/pt info and my no for call back to transfer team to d/w Dr [name] reg transfer, awaiting call back.   Hypercalcemia: normal at 8.  Anemia of chronic disease hemoglobin at 7.9 g.  Monitor transfuse if less than 7 g or symptomatic  AKI: Creatinine improved 0.9.  Continue IV fluids  Slightly abnormal EKG with V3 ST segment somewhat concave biphasic T waves in V4 but no chest pain and troponin stable  GERD continue PPI.  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  History of prostate cancer status post prostatectomy in 2019  Pelvic abscess recently treated with IR drain cultures grew E. coli patient Cipro to complete course through 07/05/20.  Fluid collection with drain still in place had resolved in the CT scan  Oral candidiasis last discharge: Continue fluconazole to complete 7 days course.   [facility] palliative care is following.  Appreciate input.  DVT prophylaxis: enoxaparin (LOVENOX) injection 40 mg Start: 06/26/20 1000 Code Status: FULL Family Communication: plan of care discussed with patient at bedside.  Status is: Inpatient  Remains inpatient appropriate because:IV treatments appropriate due to intensity of illness or inability to take PO and Inpatient level of care appropriate due to severity of illness  Dispo: The patient is from: Home              Anticipated d/c is  to: unknown/possible transfer              Anticipated d/c date is: > 3 days              Patient currently is not medically stable to d/c.   Nutrition: Diet Order            Diet NPO time specified  Diet effective now                       Body mass index is 25.68 kg/m.  Consultants:see note  Procedures:see note Microbiology:see  note  Medications: Scheduled Meds: . Chlorhexidine Gluconate Cloth  6 each Topical Daily  . enoxaparin (LOVENOX) injection  40 mg Subcutaneous Q24H  . pantoprazole (PROTONIX) IV  40 mg Intravenous Q24H  . sodium chloride flush  10-40 mL Intracatheter Q12H   Continuous Infusions: . ciprofloxacin 400 mg (06/27/20 0908)  . fluconazole (DIFLUCAN) IV 200 mg (06/27/20 1123)  . lactated ringers 125 mL/hr at 06/27/20 0324    Antimicrobials: Anti-infectives (From admission, onward)   Start     Dose/Rate Route Frequency Ordered Stop   06/26/20 0900  fluconazole (DIFLUCAN) IVPB 200 mg     Discontinue     200 mg 100 mL/hr over 60 Minutes Intravenous Every 24 hours 06/26/20 0829 07/03/20 0859   06/26/20 0830  ciprofloxacin (CIPRO) IVPB 400 mg     Discontinue     400 mg 200 mL/hr over 60 Minutes Intravenous Every 12 hours 06/26/20 0829         Objective: Vitals: Today's Vitals   06/27/20 0125 06/27/20 0513 06/27/20 0936 06/27/20 1308  BP: 122/73 (!) 142/76 (!) 142/76   Pulse: (!) 59 61 64   Resp: 16 16 20    Temp: 97.7 F (36.5 C) (!) 97.5 F (36.4 C) (!) 97.5 F (36.4 C)   TempSrc: Oral     SpO2: 100% 100% 100%   Weight:      Height:      PainSc:    6     Intake/Output Summary (Last 24 hours) at 06/27/2020 1418 Last data filed at 06/27/2020 0600 Gross per 24 hour  Intake 2187.07 ml  Output 285 ml  Net 1902.07 ml   Filed Weights   06/26/20 0251 06/26/20 0339  Weight: 81.2 kg 81.2 kg   Weight change:    Intake/Output from previous day: 07/09 0701 - 07/10 0700 In: 2187.1 [I.V.:1782.1; IV Piggyback:400] Out: 285 [Emesis/NG output:270; Drains:15] Intake/Output this shift: No intake/output data recorded.  Examination:  General exam: AAOx3 ,NAD, weak appearing. NGT+ HEENT:Oral mucosa moist, Ear/Nose WNL grossly,dentition normal. Respiratory system: bilaterally CLEAR,no wheezing or crackles,no use of accessory muscle, non tender. Cardiovascular system: S1 & S2  +,regular, No JVD. Gastrointestinal system: Abdomen soft, bowel sounds sluggish, right colostomy with small amount of stool, left ileostomy present Nervous System:Alert, awake, moving extremities and grossly nonfocal Extremities: No edema, distal peripheral pulses palpable.  Skin: No rashes,no icterus. MSK: Normal muscle bulk,tone, power  Data Reviewed: I have personally reviewed following labs and imaging studies CBC: Recent Labs  Lab 06/22/20 0628 06/22/20 1700 06/23/20 0630 06/26/20 0405 06/27/20 0827  WBC 5.0  --  5.1 6.4 4.5  NEUTROABS  --   --   --  4.6  --   HGB 7.6* 7.8* 7.6* 8.9* 7.9*  HCT 24.5* 24.7* 24.2* 28.0* 25.0*  MCV 97.6  --  97.6 94.6 97.7  PLT 329  --  383 483* 378  Basic Metabolic Panel: Recent Labs  Lab 06/22/20 0628 06/23/20 0630 06/26/20 0405 06/27/20 0827  NA 134* 133* 137 139  K 3.7 4.0 3.6 3.6  CL 104 102 102 107  CO2 22 23 27 24   GLUCOSE 122* 95 115* 91  BUN 17 14 14 13   CREATININE 0.87 0.76 1.00 0.99  CALCIUM 10.0 9.8 10.6* 9.8   GFR: Estimated Creatinine Clearance: 77.8 mL/min (by C-G formula based on SCr of 0.99 mg/dL). Liver Function Tests: Recent Labs  Lab 06/26/20 0405 06/27/20 0827  AST 23 17  ALT 40 26  ALKPHOS 132* 102  BILITOT 0.4 0.5  PROT 8.0 6.7  ALBUMIN 3.4* 2.7*   Recent Labs  Lab 06/26/20 0405  LIPASE 28   No results for input(s): AMMONIA in the last 168 hours. Coagulation Profile: No results for input(s): INR, PROTIME in the last 168 hours. Cardiac Enzymes: No results for input(s): CKTOTAL, CKMB, CKMBINDEX, TROPONINI in the last 168 hours. BNP (last 3 results) No results for input(s): PROBNP in the last 8760 hours. HbA1C: No results for input(s): HGBA1C in the last 72 hours. CBG: No results for input(s): GLUCAP in the last 168 hours. Lipid Profile: No results for input(s): CHOL, HDL, LDLCALC, TRIG, CHOLHDL, LDLDIRECT in the last 72 hours. Thyroid Function Tests: No results for input(s): TSH, T4TOTAL,  FREET4, T3FREE, THYROIDAB in the last 72 hours. Anemia Panel: No results for input(s): VITAMINB12, FOLATE, FERRITIN, TIBC, IRON, RETICCTPCT in the last 72 hours. Sepsis Labs: Recent Labs  Lab 06/26/20 0405 06/26/20 0545  LATICACIDVEN 1.1 1.1    Recent Results (from the past 240 hour(s))  SARS Coronavirus 2 by RT PCR (hospital order, performed in [facility] hospital lab) Nasopharyngeal Nasopharyngeal Swab     Status: None   Collection Time: 06/19/20 10:11 PM   Specimen: Nasopharyngeal Swab  Result Value Ref Range Status   SARS Coronavirus 2 NEGATIVE NEGATIVE Final    Comment: (NOTE) SARS-CoV-2 target nucleic acids are NOT DETECTED.  The SARS-CoV-2 RNA is generally detectable in upper and lower respiratory specimens during the acute phase of infection. The lowest concentration of SARS-CoV-2 viral copies this assay can detect is 250 copies / mL. A negative result does not preclude SARS-CoV-2 infection and should not be used as the sole basis for treatment or other patient management decisions.  A negative result may occur with improper specimen collection / handling, submission of specimen other than nasopharyngeal swab, presence of viral mutation(s) within the areas targeted by this assay, and inadequate number of viral copies (<250 copies / mL). A negative result must be combined with clinical observations, patient history, and epidemiological information.  Fact Sheet for Patients:   StrictlyIdeas.no  Fact Sheet for Healthcare Providers: BankingDealers.co.za  This test is not yet approved or  cleared by the [place] FDA and has been authorized for detection and/or diagnosis of SARS-CoV-2 by FDA under an Emergency Use Authorization (EUA).  This EUA will remain in effect (meaning this test can be used) for the duration of the COVID-19 declaration under Section 564(b)(1) of the Act, 21 U.S.C. section 360bbb-3(b)(1), unless the  authorization is terminated or revoked sooner.  Performed at [facility], [place] [address], [place], [place] [address]   Aerobic/Anaerobic Culture (surgical/deep wound)     Status: None   Collection Time: 06/20/20 12:45 PM   Specimen: Abscess  Result Value Ref Range Status   Specimen Description   Final    ABSCESS Performed at [facility],  [place] [address], [place], [place] [address]    Special Requests   Final    NONE Performed at [facility], [place] [address] [place], [place] [address]    Gram Stain   Final    ABUNDANT WBC PRESENT, PREDOMINANTLY PMN MODERATE GRAM POSITIVE COCCI IN PAIRS FEW GRAM NEGATIVE RODS RARE GRAM POSITIVE RODS    Culture   Final    ABUNDANT ESCHERICHIA COLI ABUNDANT BACTEROIDES SPECIES NOT FRAGILIS BETA LACTAMASE POSITIVE Performed at [facility], [place] [address] [place], [place] [address]    Report Status 06/23/2020 FINAL  Final   Organism ID, Bacteria ESCHERICHIA COLI  Final      Susceptibility   Escherichia coli - MIC*    AMPICILLIN >=32 RESISTANT Resistant     CEFAZOLIN <=4 SENSITIVE Sensitive     CEFEPIME <=0.12 SENSITIVE Sensitive     CEFTAZIDIME <=1 SENSITIVE Sensitive     CEFTRIAXONE <=0.25 SENSITIVE Sensitive     CIPROFLOXACIN <=0.25 SENSITIVE Sensitive     GENTAMICIN >=16 RESISTANT Resistant     IMIPENEM <=0.25 SENSITIVE Sensitive     TRIMETH/SULFA >=320 RESISTANT Resistant     AMPICILLIN/SULBACTAM >=32 RESISTANT Resistant     PIP/TAZO <=4 SENSITIVE Sensitive     * ABUNDANT ESCHERICHIA COLI  SARS Coronavirus 2 by RT PCR (hospital order, performed in [facility] hospital lab) Nasopharyngeal Nasopharyngeal Swab     Status: None   Collection Time: 06/26/20  7:45 AM   Specimen: Nasopharyngeal Swab  Result Value Ref Range Status   SARS Coronavirus 2 NEGATIVE NEGATIVE Final    Comment: (NOTE) SARS-CoV-2 target nucleic acids are NOT DETECTED.  The SARS-CoV-2 RNA is generally  detectable in upper and lower respiratory specimens during the acute phase of infection. The lowest concentration of SARS-CoV-2 viral copies this assay can detect is 250 copies / mL. A negative result does not preclude SARS-CoV-2 infection and should not be used as the sole basis for treatment or other patient management decisions.  A negative result may occur with improper specimen collection / handling, submission of specimen other than nasopharyngeal swab, presence of viral mutation(s) within the areas targeted by this assay, and inadequate number of viral copies (<250 copies / mL). A negative result must be combined with clinical observations, patient history, and epidemiological information.  Fact Sheet for Patients:   StrictlyIdeas.no  Fact Sheet for Healthcare Providers: BankingDealers.co.za  This test is not yet approved or  cleared by the [place] FDA and has been authorized for detection and/or diagnosis of SARS-CoV-2 by FDA under an Emergency Use Authorization (EUA).  This EUA will remain in effect (meaning this test can be used) for the duration of the COVID-19 declaration under Section 564(b)(1) of the Act, 21 U.S.C. section 360bbb-3(b)(1), unless the authorization is terminated or revoked sooner.  Performed at [facility], [place] [address], [place], [place] [address]       Radiology Studies: DG Abdomen 1 View  Result Date: 06/26/2020 CLINICAL DATA:  NG tube placement. EXAM: ABDOMEN - 1 VIEW COMPARISON:  CT 06/26/2020. FINDINGS: PowerPort catheter noted with tip over SVC. NG tube noted with tip over the proximal stomach. Side hole at the gastroesophageal junction. Advancement of approximately 10 cm suggested. Contrast from prior CT noted in the renal collecting systems. Bilateral hydronephrosis again noted. IMPRESSION: 1. NG tube noted with tip over the upper stomach. Side hole is at the  gastroesophageal junction. NG tube advancement of approximately 10 cm suggested. 2.  Bilateral hydronephrosis again noted. Electronically Signed   By: Marcello Moores  Register   On: 06/26/2020 07:40   CT ABDOMEN PELVIS W CONTRAST  Result Date: 06/26/2020 CLINICAL DATA:  Mid abdominal pain with nausea and vomiting. Decreased colostomy output EXAM: CT ABDOMEN AND PELVIS WITH CONTRAST TECHNIQUE: Multidetector CT imaging of the abdomen and pelvis was performed using the standard protocol following bolus administration of intravenous contrast. CONTRAST:  181mL OMNIPAQUE IOHEXOL 300 MG/ML  SOLN COMPARISON:  06/19/2020 FINDINGS: Lower chest:  No contributory findings. Hepatobiliary: No focal liver abnormality.Increased density in the dependent gallbladder likely reflecting sludge. No calcified stone or regional inflammation. Pancreas: Unremarkable. Spleen: Unremarkable. Adrenals/Urinary Tract: Negative adrenals. Right hydronephrosis and left pelviectasis which is chronic but more prominent than on comparison. The upper aspect of the bladder has prominent wall thickness, stable and nonspecific. 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 Fluid-filled and dilated loops of bowel with transition in the right abdomen where there is clustering of bowel loops and serosal based indistinctness and sheet like enhancement. Minimal contained fluid in the left upper quadrant peritoneal space. Thick-walled sigmoid colon with multiple diverticula. Presumed diverticulitis with diverticular abscess along the left iliacus which is been completely decompressed by percutaneous catheter. Subhepatic gas appears to be within bowel lumen on coronal reformats. Vascular/Lymphatic: No acute vascular abnormality. No mass or adenopathy. Reproductive:Prostatectomy. Other: Peritoneal findings noted above. Musculoskeletal: No acute abnormalities.  Lumbar spine degeneration. IMPRESSION: 1. Small bowel obstruction with  transition at the level of multiple clumped right-sided small bowel loops that is likely related to adhesions and peritoneal disease. 2. Recent percutaneous drainage of a left iliopsoas abscess with resolved fluid collection. 3. Stable right hydronephrosis and left pelviectasis with bladder wall thickening. Electronically Signed   By: Monte Fantasia M.D.   On: 06/26/2020 06:08   DG Abd 2 Views  Result Date: 06/27/2020 CLINICAL DATA:  NG tube placement.  Small-bowel obstruction. EXAM: ABDOMEN - 2 VIEW COMPARISON:  Prior studies FINDINGS: An NG tube is identified with tip overlying the mid-distal stomach. No other significant change noted. No dilated bowel loops are identified. A percutaneous catheter overlying the LOWER LEFT abdomen is again noted. IMPRESSION: NG tube with tip overlying the mid-distal stomach. Electronically Signed   By: Margarette Canada M.D.   On: 06/27/2020 09:35     LOS: 1 day   Antonieta Pert, MD [facility]  06/27/2020, 2:18 PM

## 2020-06-27 NOTE — Progress Notes (Signed)
Juan Richards was lying in bed alert and awake when I arrived. His wife was bedside and their daughter joined [place] later. Juan Richards wanted to talk about his living will. I went over the questions with him and he made the decisions and noted them accordingly. It just needs to be notarized at this point.  He also talked about his health journey and where he is at this point.  He said they Sales promotion account executive team) wants to talk about surgery and he said he wants to go through the process. He described that as a conversation with one of his care team members who suggested letting his bowels rest and then the tube would be taken out to see if he is better or if he gets sick again. He said he wants to go through the process rather than rushing to surgery. His wife and daughter agreed with him and all were concerned about surgery. They were also concerned about a conversation with a palliative care team member. Pt's wife's mother just passed 06/04/2023 and so death is fresh and their idea of hospice and palliative is this is the end. In a brief conversation, I tried to dispell their myth. However, I think they need a conversation of clarity. I encouraged them to ask questions and the questions they were asking were good questions.  They are a family of faith and great hope and believe that pt will pull this through this and get better.  We ended our visit with prayer and a song for which they were very grateful. Please page if additional support is needed. [place], MDiv

## 2020-06-27 NOTE — Plan of Care (Signed)

## 2020-06-27 NOTE — Progress Notes (Signed)
I received a call back from Dr. Carlis Abbott at [facility] discussed with him he AGREED TO ACCEPT HIM.I called and discussed with patient and patient's wife patient reports he is not having nausea vomiting he feels okay and is hoping to improve here and will await for x-ray in the morning and wants to see Dr. Ninfa Linden in the morning and does not want to get transferred until tomorrow. I did explain him that they do have a bed today and they can take him. I did explain him that they may or may not have bed tomorrow. In case if he requires surgery and there is no bed it will be difficult situation for him understands the fact and still declines to transfer today. I called back [facility] line [phone] and updated them about patient's decision to hold off until tomorrow.

## 2020-06-27 NOTE — Progress Notes (Signed)
Subjective/Chief Complaint: Pt reports decreased pain, increased ostomy output NG with minimal out   Objective: Vital signs in last 24 hours: Temp:  [97.5 F (36.4 C)-97.8 F (36.6 C)] 97.5 F (36.4 C) (07/10 0513) Pulse Rate:  [54-92] 61 (07/10 0513) Resp:  [13-22] 16 (07/10 0513) BP: (115-145)/(73-92) 142/76 (07/10 0513) SpO2:  [99 %-100 %] 100 % (07/10 0513) Last BM Date: 06/26/20  Intake/Output from previous day: 07/09 0701 - 07/10 0700 In: 2187.1 [I.V.:1782.1; IV Piggyback:400] Out: 285 [Emesis/NG output:270; Drains:15] Intake/Output this shift: No intake/output data recorded.  Exam: Awake and alert Abdomen soft, ostomy productive  Lab Results:  Recent Labs    06/26/20 0405  WBC 6.4  HGB 8.9*  HCT 28.0*  PLT 483*   BMET Recent Labs    06/26/20 0405  NA 137  K 3.6  CL 102  CO2 27  GLUCOSE 115*  BUN 14  CREATININE 1.00  CALCIUM 10.6*   PT/INR No results for input(s): LABPROT, INR in the last 72 hours. ABG No results for input(s): PHART, HCO3 in the last 72 hours.  Invalid input(s): PCO2, PO2  Studies/Results: DG Abdomen 1 View  Result Date: 06/26/2020 CLINICAL DATA:  NG tube placement. EXAM: ABDOMEN - 1 VIEW COMPARISON:  CT 06/26/2020. FINDINGS: PowerPort catheter noted with tip over SVC. NG tube noted with tip over the proximal stomach. Side hole at the gastroesophageal junction. Advancement of approximately 10 cm suggested. Contrast from prior CT noted in the renal collecting systems. Bilateral hydronephrosis again noted. IMPRESSION: 1. NG tube noted with tip over the upper stomach. Side hole is at the gastroesophageal junction. NG tube advancement of approximately 10 cm suggested. 2.  Bilateral hydronephrosis again noted. Electronically Signed   By: Marcello Moores  Register   On: 06/26/2020 07:40   CT ABDOMEN PELVIS W CONTRAST  Result Date: 06/26/2020 CLINICAL DATA:  Mid abdominal pain with nausea and vomiting. Decreased colostomy output EXAM: CT  ABDOMEN AND PELVIS WITH CONTRAST TECHNIQUE: Multidetector CT imaging of the abdomen and pelvis was performed using the standard protocol following bolus administration of intravenous contrast. CONTRAST:  172mL OMNIPAQUE IOHEXOL 300 MG/ML  SOLN COMPARISON:  06/19/2020 FINDINGS: Lower chest:  No contributory findings. Hepatobiliary: No focal liver abnormality.Increased density in the dependent gallbladder likely reflecting sludge. No calcified stone or regional inflammation. Pancreas: Unremarkable. Spleen: Unremarkable. Adrenals/Urinary Tract: Negative adrenals. Right hydronephrosis and left pelviectasis which is chronic but more prominent than on comparison. The upper aspect of the bladder has prominent wall thickness, stable and nonspecific. 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 Fluid-filled and dilated loops of bowel with transition in the right abdomen where there is clustering of bowel loops and serosal based indistinctness and sheet like enhancement. Minimal contained fluid in the left upper quadrant peritoneal space. Thick-walled sigmoid colon with multiple diverticula. Presumed diverticulitis with diverticular abscess along the left iliacus which is been completely decompressed by percutaneous catheter. Subhepatic gas appears to be within bowel lumen on coronal reformats. Vascular/Lymphatic: No acute vascular abnormality. No mass or adenopathy. Reproductive:Prostatectomy. Other: Peritoneal findings noted above. Musculoskeletal: No acute abnormalities.  Lumbar spine degeneration. IMPRESSION: 1. Small bowel obstruction with transition at the level of multiple clumped right-sided small bowel loops that is likely related to adhesions and peritoneal disease. 2. Recent percutaneous drainage of a left iliopsoas abscess with resolved fluid collection. 3. Stable right hydronephrosis and left pelviectasis with bladder wall thickening. Electronically Signed   By: Monte Fantasia M.D.   On: 06/26/2020  06:08    Anti-infectives: Anti-infectives (From admission, onward)   Start     Dose/Rate Route Frequency Ordered Stop   06/26/20 0900  fluconazole (DIFLUCAN) IVPB 200 mg     Discontinue     200 mg 100 mL/hr over 60 Minutes Intravenous Every 24 hours 06/26/20 0829 07/03/20 0859   06/26/20 0830  ciprofloxacin (CIPRO) IVPB 400 mg     Discontinue     400 mg 200 mL/hr over 60 Minutes Intravenous Every 12 hours 06/26/20 0829        Assessment/Plan: Carcinomatosis from appendiceal cancer  Continue NG Repeat films in the morning Per Dr. Marlowe Aschoff note yesterday, recommend transfer to [facility]  LOS: 1 day    Coralie Keens 06/27/2020

## 2020-06-27 NOTE — Progress Notes (Signed)
Daily Progress Note   Patient Name: Juan Richards       Date: 06/27/2020 DOB: May 27, 1956  Age: 64 y.o. MRN#: [id number] Attending Physician: Antonieta Pert, MD Primary Care Physician: [name], MD Admit Date: 06/26/2020  Reason for Consultation/Follow-up: Disposition, Establishing goals of care, Non pain symptom management, Pain control and Psychosocial/spiritual support  Subjective: Chart review performed.  Spoke with primary RN. No acute concerns or events overnight.  Went to visit patient at bedside - no family present. Patient was moved from ED to inpatient unit last night. He was laying in bed awake and alert, in no acute distress. X-ray had just been performed. Patient stated that his pain and nausea were being managed well. He stated the chloraseptic mouth spray ordered yesterday did help his sore throat. He was not able to complete his advanced directive paperwork with Chaplain yesterday - encouraged him to let his primary RN know when he was ready to complete them today. Further discussion was had regarding plan of care - patient stated that he is still thinking about the surgery but has not decided. He does understand that his SBO will likely not resolve without operation. He expressed frustration - he stated he felt he was being pushed to have surgery because physicians stated they were going to obtain a bed at [facility] how that is positive because sometimes it can take days to obtain a bed - if he does decide to have surgery, they would be able to transfer him possibly sooner than later, which he understood. Explained that continued conversation about this decision will be important to have with his family and providers.  All questions and concerns addressed. Encouraged to call with any  questions and/or concerns.   Length of Stay: 1  Current Medications: Scheduled Meds:  . Chlorhexidine Gluconate Cloth  6 each Topical Daily  . enoxaparin (LOVENOX) injection  40 mg Subcutaneous Q24H  . pantoprazole (PROTONIX) IV  40 mg Intravenous Q24H  . sodium chloride flush  10-40 mL Intracatheter Q12H    Continuous Infusions: . ciprofloxacin 400 mg (06/27/20 0908)  . fluconazole (DIFLUCAN) IV 200 mg (06/26/20 0923)  . lactated ringers 125 mL/hr at 06/27/20 0324    PRN Meds: acetaminophen **OR** acetaminophen, morphine injection, phenol, promethazine, sodium chloride flush  Physical Exam Vitals and nursing note reviewed.  Constitutional:      General: He is not in acute distress. HENT:     Head:     Comments: NGT present Pulmonary:     Effort: Pulmonary effort is normal. No respiratory distress.  Skin:    General: Skin is warm and dry.  Neurological:     Mental Status: He is alert and oriented to person, place, and time.  Psychiatric:        Behavior: Behavior is cooperative.        Cognition and Memory: Cognition normal.             Vital Signs: BP (!) 142/76 (BP Location: Left Arm)   Pulse 64   Temp (!) 97.5 F (36.4 C)   Resp 20   Ht 5\' 10"  (1.778 m)   Wt 81.2 kg   SpO2 100%  BMI 25.68 kg/m  SpO2: SpO2: 100 % O2 Device: O2 Device: Room Air O2 Flow Rate:    Intake/output summary:   Intake/Output Summary (Last 24 hours) at 06/27/2020 0940 Last data filed at 06/27/2020 0600 Gross per 24 hour  Intake 2187.07 ml  Output 285 ml  Net 1902.07 ml   LBM: Last BM Date: 06/26/20 Baseline Weight: Weight: 81.2 kg Most recent weight: Weight: 81.2 kg       Palliative Assessment/Data: PPS 80%, decreased intake due to SBO    Flowsheet Rows     Most Recent Value  Intake Tab  Referral Department Surgery  Unit at Time of Referral ER  Palliative Care Primary Diagnosis Cancer  Date Notified 06/26/20  Palliative Care Type Return patient Palliative Care    Reason for referral Clarify Goals of Care  Date of Admission 06/26/20  Date first seen by Palliative Care 06/26/20  # of days Palliative referral response time 0 Day(s)  # of days IP prior to Palliative referral 0  Clinical Assessment  Psychosocial & Spiritual Assessment  Palliative Care Outcomes      Patient Active Problem List   Diagnosis Date Noted  . Pelvic abscess in male [facility]) 06/19/2020  . Oral candidiasis 06/19/2020  . Chronic diarrhea 04/28/2020  . Port-A-Cath in place 03/31/2020  . Appendix carcinoma ([place]) 03/02/2020  . Protein-calorie malnutrition, moderate ([place]) 02/01/2020  . 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 Acute blood loss anemia 01/29/2020  . Hyponatremia 01/29/2020  . Goals of care, counseling/discussion   . Palliative care by specialist   . Bowel perforation ([place]) 01/27/2020  . Sepsis ([place]) 01/26/2020  . Neutropenia with fever ([place]) 01/26/2020  . 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 Diverticulitis of large intestine with perforation 01/25/2020  . SBO (small bowel obstruction) ([place]) 01/25/2020  . Prostate cancer ([place]) 09/21/2018  . Pre-diabetes 06/20/2018  . Routine general medical examination at a health care facility 08/11/2016  . Gross hematuria 07/23/2009  . UTI (urinary tract infection) 09/27/2007  . 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 EPIDIDYMAL CYST 08/31/2007    Palliative Care Assessment & Plan   HPI: 64 y.o. 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   General surgery consulted - recommending conservative management with bowel decompression.  He has been offered CRS-HIPEC treatment at [facility] - per consult he is unsure if he wants further surgeries - GOC discussion requested.  Recent hospitalization from 7/2-06/23/20 for pelvic abscess treated with IR  drainage.   Assessment: Small bowel obstruction Abdominal carcinomatosis Appendix carcinoma Pelvic abscess Oral candidiasis  Recommendations/Plan:  Continue current medical treatment  Continue full code status  Patient is still deciding if he wants surgical intervention and transfer to [facility].   Encouraged patient to let RN know when he was ready to see Chaplain and complete advanced directive paperwork - Spiritual Care consult previously placed  PMT will continue to follow holistically  Goals of Care and Additional Recommendations:  Limitations on Scope of Treatment: Full Scope Treatment  Code Status:  Full code  Prognosis:   Unable to determine  Discharge Planning:  To Be Determined  Care plan was discussed with primary RN, patient  Thank you for allowing the Palliative Medicine Team to assist in the care of this patient.   Total Time 25 minutes Prolonged Time Billed  no  Greater than 50%  of this time was spent counseling and coordinating care related to the above assessment and plan.  Jesusita Jocelyn M. Tamala Julian, MSN, FNP-BC Palliative Medicine Team Team Phone # [phone]

## 2020-06-28 ENCOUNTER — Inpatient Hospital Stay (HOSPITAL_COMMUNITY): Payer: 59

## 2020-06-28 LAB — MAGNESIUM: Magnesium: 1.4 mg/dL — ABNORMAL LOW (ref 1.7–2.4)

## 2020-06-28 LAB — BASIC METABOLIC PANEL
Anion gap: 8 (ref 5–15)
BUN: 14 mg/dL (ref 8–23)
CO2: 29 mmol/L (ref 22–32)
Calcium: 10.1 mg/dL (ref 8.9–10.3)
Chloride: 106 mmol/L (ref 98–111)
Creatinine, Ser: 1.02 mg/dL (ref 0.61–1.24)
GFR calc Af Amer: >60 mL/min (ref >60)
GFR calc non Af Amer: >60 mL/min (ref >60)
Glucose, Bld: 88 mg/dL (ref 70–99)
Potassium: 3.3 mmol/L — ABNORMAL LOW (ref 3.5–5.1)
Sodium: 143 mmol/L (ref 135–145)

## 2020-06-28 LAB — CBC
HCT: 23.8 % — ABNORMAL LOW (ref 39.0–52.0)
Hemoglobin: 7.4 g/dL — ABNORMAL LOW (ref 13.0–17.0)
MCH: 30.1 pg (ref 26.0–34.0)
MCHC: 31.1 g/dL (ref 30.0–36.0)
MCV: 96.7 fL (ref 80.0–100.0)
Platelets: 361 10*3/uL (ref 150–400)
RBC: 2.46 MIL/uL — ABNORMAL LOW (ref 4.22–5.81)
RDW: 14.3 % (ref 11.5–15.5)
WBC: 4.3 10*3/uL (ref 4.0–10.5)
nRBC: 0 % (ref 0.0–0.2)

## 2020-06-28 MED ORDER — POTASSIUM CHLORIDE 10 MEQ/100ML IV SOLN
10.0000 meq | INTRAVENOUS | Status: AC
  Administered 2020-06-28 (×3): 10 meq via INTRAVENOUS
  Filled 2020-06-28 (×3): qty 100

## 2020-06-28 NOTE — Progress Notes (Signed)
   Subjective/Chief Complaint: Comfortable this morning with minimal pain   Objective: Vital signs in last 24 hours: Temp:  [97.5 F (36.4 C)-98.1 F (36.7 C)] 97.8 F (36.6 C) (07/11 0505) Pulse Rate:  [51-64] 59 (07/11 0505) Resp:  [14-20] 16 (07/11 0505) BP: (128-145)/(76-93) 128/82 (07/11 0505) SpO2:  [100 %] 100 % (07/11 0505) Last BM Date: 06/27/20  Intake/Output from previous day: 07/10 0701 - 07/11 0700 In: 4455.5 [P.O.:240; I.V.:3610.9; IV Piggyback:604.7] Out: -  Intake/Output this shift: No intake/output data recorded.  Exam: Awake and alert Abdomen soft, ostomy with bile in the bag, second ostomy emty Large amount of bile in NG cannister.  Lab Results:  Recent Labs    06/27/20 0827 06/28/20 0305  WBC 4.5 4.3  HGB 7.9* 7.4*  HCT 25.0* 23.8*  PLT 378 361   BMET Recent Labs    06/27/20 0827 06/28/20 0305  NA 139 143  K 3.6 3.3*  CL 107 106  CO2 24 29  GLUCOSE 91 88  BUN 13 14  CREATININE 0.99 1.02  CALCIUM 9.8 10.1   PT/INR No results for input(s): LABPROT, INR in the last 72 hours. ABG No results for input(s): PHART, HCO3 in the last 72 hours.  Invalid input(s): PCO2, PO2  Studies/Results: DG Abd 2 Views  Result Date: 06/27/2020 CLINICAL DATA:  NG tube placement.  Small-bowel obstruction. EXAM: ABDOMEN - 2 VIEW COMPARISON:  Prior studies FINDINGS: An NG tube is identified with tip overlying the mid-distal stomach. No other significant change noted. No dilated bowel loops are identified. A percutaneous catheter overlying the LOWER LEFT abdomen is again noted. IMPRESSION: NG tube with tip overlying the mid-distal stomach. Electronically Signed   By: Margarette Canada M.D.   On: 06/27/2020 09:35    Anti-infectives: Anti-infectives (From admission, onward)   Start     Dose/Rate Route Frequency Ordered Stop   06/26/20 0900  fluconazole (DIFLUCAN) IVPB 200 mg     Discontinue     200 mg 100 mL/hr over 60 Minutes Intravenous Every 24 hours 06/26/20  0829 07/03/20 0859   06/26/20 0830  ciprofloxacin (CIPRO) IVPB 400 mg     Discontinue     400 mg 200 mL/hr over 60 Minutes Intravenous Every 12 hours 06/26/20 8016        Assessment/Plan: Carcinomatosis from appendiceal cancer  abd xray not improved Again, obstruction more likely from cancer I explained this to the patient again and recommended transfer to [facility] for further care  LOS: 2 days    Coralie Keens 06/28/2020

## 2020-06-28 NOTE — Progress Notes (Signed)
Mr. Nemes was sitting up on the side of the bed when I arrived. He said he was okay, just sore from the tube. His wife was present but ran an errand and came back during our visit. Pt's AdvDir were completed, signed, witnessed, notarized and copies are in place and I handed Mr. Bender the original.  We had prayer for his upcoming surgery. He presented a nature of peace about the surgery that he did not have ystrdy. Please page if additional support is needed. [place], MDiv   06/28/20 1800  Clinical Encounter Type  Visited With Patient and family together

## 2020-06-28 NOTE — Progress Notes (Signed)
Initial Nutrition Assessment  DOCUMENTATION CODES:   Not applicable  INTERVENTION:  Diet advancement as appropriate per MD and team pending improvement and progression of SBO.  NUTRITION DIAGNOSIS:   Increased nutrient needs related to chronic illness (cancer) as evidenced by estimated needs.  GOAL:   Patient will meet greater than or equal to 90% of their needs  MONITOR:   Diet advancement, Skin, Weight trends, Labs, I & O's  REASON FOR ASSESSMENT:   Malnutrition Screening Tool    ASSESSMENT:   64 y.o. male with PMH significant for metastatic appendix cancer with abdominal carcinomatosis s/p FOLFOX (started April 2021 but he stopped on 05/27/2020 due to progressive neuropathy), resection, and ileostomy presents with significant decrease in ostomy output and n/v.  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  Pt currently NPO with NGT in place to LIS. NGT output 550 ml. Per MD, if surgery indicated plans to transfer to [facility]. Pt with no significant weight loss per weight records. Pt contacted via inpatient room phone. Pt reports no abdominal discomfort or pain at time of visit. He reports eating well PTA with usual consumption of at least 3 meals a day with no difficulties. Recommend diet advancement as appropriate pending improvement and progression of SBO.  Unable to complete Nutrition-Focused physical exam at this time.   Labs and medications reviewed. Potassium low at 3.3 being repleted. Magnesium low at 1.4.  Diet Order:   Diet Order            Diet NPO time specified  Diet effective now                 EDUCATION NEEDS:   Not appropriate for education at this time  Skin:  Skin Assessment: Reviewed RN Assessment  Last BM:  7/10 ileostomy  Height:   Ht Readings from Last 1 Encounters:  06/26/20 5\' 10"  (1.778 m)     Weight:   Wt Readings from Last 1 Encounters:  06/26/20 81.2 kg   BMI:  Body mass index is 25.68 kg/m.  Estimated Nutritional Needs:   Kcal:  2300-2500  Protein:  110-125 grams  Fluid:  >/= 2.5 L/day  Corrin Parker, MS, RD, LDN RD pager number/after hours weekend pager number on Amion.

## 2020-06-28 NOTE — Progress Notes (Signed)
Report received from [facility] around 1:30 am on 06/28/20. I will continue to resume care.

## 2020-06-28 NOTE — Progress Notes (Signed)
PROGRESS NOTE    Juan Richards  WPY:[id number] DOB: 09-07-1956 DOA: 06/26/2020 PCP: [name], MD   Chef Complaints: Terminal pain  Brief Narrative: Per admitting 64 y.o. male with PMH significant for metastatic appendix cancer with abdominal carcinomatosis who was admitted on 06/19/20 for pelvic abscess treated with IR drain,cultures grew out E. coli sensitive to Cipro and patient was discharged on Cipro on 06/23/20 FOR 12 MORE DAYS. He returned to the ED after noting significant decrease in his ostomy output.  Normally at 6-7 bags of output a day and decreased to 1.  With associated nausea and vomiting. ED Course:  The patient was noted to be afebrile with otherwise normal vital signs.  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  Fluid collection with drain still in place had resolved. Patient was discussed with general surgery who recommended admission by [facility].  He was treated with morphine and NG tube placement.  Subjective: No new complaints.  NG tube in place with minimal output, denies nausea vomiting.  Pain is controlled.  Some stool output in the right colostomy. NG tube with lot of bilious drainage   Assessment & Plan:  SBO: secondary to abdominal carcinomatosis:Surgery following on NG tube repeat x-ray ordered for the morning.Surgery saw and recommended transfer to [facility]. Patient reluctant for transfer I told him that sometimes admitted few more days it will be prudent to make a call and initiate transfer, he understand that if he needs surgery it has to be done at [facility] for the surgeon recommendation here.  We will keep on IV fluids.cont pain control.  He is still thinking about whether to transfer to [facility] or not.   Hypercalcemia: normal at 8.  Anemia of chronic disease hemoglobin at 7.9 g.  Monitor transfuse if less than 7 g or  symptomatic  AKI: Creatinine improved 0.9.  Continue IV fluids  Slightly abnormal EKG with V3 ST segment somewhat concave biphasic T waves in V4 but no chest pain and troponin stable  GERD continue PPI.  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  History of prostate cancer status post prostatectomy in 2019  Pelvic abscess recently treated with IR drain cultures grew E. coli patient Cipro to complete course through 07/05/20.  Fluid collection with drain still in place had resolved in the CT scan  Oral candidiasis last discharge: Continue fluconazole to complete 7 days course.  Hypokalemia potassium is 3.3 replete today and check magnesium.   [facility] is following.  Appreciate input.  DVT prophylaxis: enoxaparin (LOVENOX) injection 40 mg Start: 06/26/20 1000 Code Status: FULL Family Communication: plan of care discussed with patient at bedside.  Status is: Inpatient  Remains inpatient appropriate because:IV treatments appropriate due to intensity of illness or inability to take PO and Inpatient level of care appropriate due to severity of illness  Dispo: The patient is from: Home              Anticipated d/c is to: unknown/possible transfer              Anticipated d/c date is: > 3 days              Patient currently  is not medically stable to d/c.   Nutrition: Diet Order            Diet NPO time specified  Diet effective now                       Body mass index is 25.68 kg/m.  Consultants:see note  Procedures:see note Microbiology:see note  Medications: Scheduled Meds: . Chlorhexidine Gluconate Cloth  6 each Topical Daily  . enoxaparin (LOVENOX) injection  40 mg Subcutaneous Q24H  . pantoprazole (PROTONIX) IV  40 mg  Intravenous Q24H  . sodium chloride flush  10-40 mL Intracatheter Q12H   Continuous Infusions: . ciprofloxacin 400 mg (06/28/20 0825)  . fluconazole (DIFLUCAN) IV 200 mg (06/28/20 0941)  . lactated ringers 125 mL/hr at 06/28/20 0044    Antimicrobials: Anti-infectives (From admission, onward)   Start     Dose/Rate Route Frequency Ordered Stop   06/26/20 0900  fluconazole (DIFLUCAN) IVPB 200 mg     Discontinue     200 mg 100 mL/hr over 60 Minutes Intravenous Every 24 hours 06/26/20 0829 07/03/20 0859   06/26/20 0830  ciprofloxacin (CIPRO) IVPB 400 mg     Discontinue     400 mg 200 mL/hr over 60 Minutes Intravenous Every 12 hours 06/26/20 0829         Objective: Vitals: Today's Vitals   06/27/20 2133 06/27/20 2203 06/28/20 0315 06/28/20 0505  BP:    128/82  Pulse:    (!) 59  Resp:    16  Temp:    97.8 F (36.6 C)  TempSrc:      SpO2:    100%  Weight:      Height:      PainSc: 6  3  0-No pain     Intake/Output Summary (Last 24 hours) at 06/28/2020 1225 Last data filed at 06/28/2020 0825 Gross per 24 hour  Intake 4455.53 ml  Output 700 ml  Net 3755.53 ml   Filed Weights   06/26/20 0251 06/26/20 0339  Weight: 81.2 kg 81.2 kg   Weight change:    Intake/Output from previous day: 07/10 0701 - 07/11 0700 In: 4455.5 [P.O.:240; I.V.:3610.9; IV Piggyback:604.7] Out: 550 [Emesis/NG output:550] Intake/Output this shift: Total I/O In: -  Out: 150 [Emesis/NG output:150]  Examination:  General exam: AAOx3 ,NAD, weak appearing. NGT+ HEENT:Oral mucosa moist, Ear/Nose WNL grossly,dentition normal. Respiratory system: bilaterally CLEAR,no wheezing or crackles,no use of accessory muscle, non tender. Cardiovascular system: S1 & S2 +,regular, No JVD. Gastrointestinal system: Abdomen soft, bowel sounds sluggish, right colostomy with small amount of stool, left ileostomy present Nervous System:Alert, awake, moving extremities and grossly nonfocal Extremities: No edema, distal  peripheral pulses palpable.  Skin: No rashes,no icterus. MSK: Normal muscle bulk,tone, power  Data Reviewed: I have personally reviewed following labs and imaging studies CBC: Recent Labs  Lab 06/22/20 0628 06/22/20 0628 06/22/20 1700 06/23/20 0630 06/26/20 0405 06/27/20 0827 06/28/20 0305  WBC 5.0  --   --  5.1 6.4 4.5 4.3  NEUTROABS  --   --   --   --  4.6  --   --   HGB 7.6*   < > 7.8* 7.6* 8.9* 7.9* 7.4*  HCT 24.5*   < > 24.7* 24.2* 28.0* 25.0* 23.8*  MCV 97.6  --   --  97.6 94.6 97.7 96.7  PLT 329  --   --  383 483* 378 361   < > = values in this interval not  displayed.   Basic Metabolic Panel: Recent Labs  Lab 06/22/20 0628 06/23/20 0630 06/26/20 0405 06/27/20 0827 06/28/20 0305  NA 134* 133* 137 139 143  K 3.7 4.0 3.6 3.6 3.3*  CL 104 102 102 107 106  CO2 22 23 27 24 29   GLUCOSE 122* 95 115* 91 88  BUN 17 14 14 13 14   CREATININE 0.87 0.76 1.00 0.99 1.02  CALCIUM 10.0 9.8 10.6* 9.8 10.1   GFR: Estimated Creatinine Clearance: 75.5 mL/min (by C-G formula based on SCr of 1.02 mg/dL). Liver Function Tests: Recent Labs  Lab 06/26/20 0405 06/27/20 0827  AST 23 17  ALT 40 26  ALKPHOS 132* 102  BILITOT 0.4 0.5  PROT 8.0 6.7  ALBUMIN 3.4* 2.7*   Recent Labs  Lab 06/26/20 0405  LIPASE 28   No results for input(s): AMMONIA in the last 168 hours. Coagulation Profile: No results for input(s): INR, PROTIME in the last 168 hours. Cardiac Enzymes: No results for input(s): CKTOTAL, CKMB, CKMBINDEX, TROPONINI in the last 168 hours. BNP (last 3 results) No results for input(s): PROBNP in the last 8760 hours. HbA1C: No results for input(s): HGBA1C in the last 72 hours. CBG: No results for input(s): GLUCAP in the last 168 hours. Lipid Profile: No results for input(s): CHOL, HDL, LDLCALC, TRIG, CHOLHDL, LDLDIRECT in the last 72 hours. Thyroid Function Tests: No results for input(s): TSH, T4TOTAL, FREET4, T3FREE, THYROIDAB in the last 72 hours. Anemia  Panel: No results for input(s): VITAMINB12, FOLATE, FERRITIN, TIBC, IRON, RETICCTPCT in the last 72 hours. Sepsis Labs: Recent Labs  Lab 06/26/20 0405 06/26/20 0545  LATICACIDVEN 1.1 1.1    Recent Results (from the past 240 hour(s))  SARS Coronavirus 2 by RT PCR (hospital order, performed in [facility] hospital lab) Nasopharyngeal Nasopharyngeal Swab     Status: None   Collection Time: 06/19/20 10:11 PM   Specimen: Nasopharyngeal Swab  Result Value Ref Range Status   SARS Coronavirus 2 NEGATIVE NEGATIVE Final    Comment: (NOTE) SARS-CoV-2 target nucleic acids are NOT DETECTED.  The SARS-CoV-2 RNA is generally detectable in upper and lower respiratory specimens during the acute phase of infection. The lowest concentration of SARS-CoV-2 viral copies this assay can detect is 250 copies / mL. A negative result does not preclude SARS-CoV-2 infection and should not be used as the sole basis for treatment or other patient management decisions.  A negative result may occur with improper specimen collection / handling, submission of specimen other than nasopharyngeal swab, presence of viral mutation(s) within the areas targeted by this assay, and inadequate number of viral copies (<250 copies / mL). A negative result must be combined with clinical observations, patient history, and epidemiological information.  Fact Sheet for Patients:   StrictlyIdeas.no  Fact Sheet for Healthcare Providers: BankingDealers.co.za  This test is not yet approved or  cleared by the [place] FDA and has been authorized for detection and/or diagnosis of SARS-CoV-2 by FDA under an Emergency Use Authorization (EUA).  This EUA will remain in effect (meaning this test can be used) for the duration of the COVID-19 declaration under Section 564(b)(1) of the Act, 21 U.S.C. section 360bbb-3(b)(1), unless the authorization is terminated or revoked  sooner.  Performed at [facility], [place] [address] [place], [place] [address]   Aerobic/Anaerobic Culture (surgical/deep wound)     Status: None   Collection Time: 06/20/20 12:45 PM   Specimen: Abscess  Result Value Ref Range Status   Specimen Description  Final    ABSCESS Performed at [facility], [place] [address] [place], [place] [address]    Special Requests   Final    NONE Performed at [facility], [place] [address], [place], [place] [address]    Gram Stain   Final    ABUNDANT WBC PRESENT, PREDOMINANTLY PMN MODERATE GRAM POSITIVE COCCI IN PAIRS FEW GRAM NEGATIVE RODS RARE GRAM POSITIVE RODS    Culture   Final    ABUNDANT ESCHERICHIA COLI ABUNDANT BACTEROIDES SPECIES NOT FRAGILIS BETA LACTAMASE POSITIVE Performed at [facility], [place] [address] [place], [place] [address]    Report Status 06/23/2020 FINAL  Final   Organism ID, Bacteria ESCHERICHIA COLI  Final      Susceptibility   Escherichia coli - MIC*    AMPICILLIN >=32 RESISTANT Resistant     CEFAZOLIN <=4 SENSITIVE Sensitive     CEFEPIME <=0.12 SENSITIVE Sensitive     CEFTAZIDIME <=1 SENSITIVE Sensitive     CEFTRIAXONE <=0.25 SENSITIVE Sensitive     CIPROFLOXACIN <=0.25 SENSITIVE Sensitive     GENTAMICIN >=16 RESISTANT Resistant     IMIPENEM <=0.25 SENSITIVE Sensitive     TRIMETH/SULFA >=320 RESISTANT Resistant     AMPICILLIN/SULBACTAM >=32 RESISTANT Resistant     PIP/TAZO <=4 SENSITIVE Sensitive     * ABUNDANT ESCHERICHIA COLI  SARS Coronavirus 2 by RT PCR (hospital order, performed in [place] hospital lab) Nasopharyngeal Nasopharyngeal Swab     Status: None   Collection Time: 06/26/20  7:45 AM   Specimen: Nasopharyngeal Swab  Result Value Ref Range Status   SARS Coronavirus 2 NEGATIVE NEGATIVE Final    Comment: (NOTE) SARS-CoV-2 target nucleic acids are NOT DETECTED.  The SARS-CoV-2 RNA is generally detectable in upper and  lower respiratory specimens during the acute phase of infection. The lowest concentration of SARS-CoV-2 viral copies this assay can detect is 250 copies / mL. A negative result does not preclude SARS-CoV-2 infection and should not be used as the sole basis for treatment or other patient management decisions.  A negative result may occur with improper specimen collection / handling, submission of specimen other than nasopharyngeal swab, presence of viral mutation(s) within the areas targeted by this assay, and inadequate number of viral copies (<250 copies / mL). A negative result must be combined with clinical observations, patient history, and epidemiological information.  Fact Sheet for Patients:   StrictlyIdeas.no  Fact Sheet for Healthcare Providers: BankingDealers.co.za  This test is not yet approved or  cleared by the [place] FDA and has been authorized for detection and/or diagnosis of SARS-CoV-2 by FDA under an Emergency Use Authorization (EUA).  This EUA will remain in effect (meaning this test can be used) for the duration of the COVID-19 declaration under Section 564(b)(1) of the Act, 21 U.S.C. section 360bbb-3(b)(1), unless the authorization is terminated or revoked sooner.  Performed at [facility], [place] [address], [place], [place] [address]       Radiology Studies: DG Abd 2 Views  Result Date: 06/27/2020 CLINICAL DATA:  NG tube placement.  Small-bowel obstruction. EXAM: ABDOMEN - 2 VIEW COMPARISON:  Prior studies FINDINGS: An NG tube is identified with tip overlying the mid-distal stomach. No other significant change noted. No dilated bowel loops are identified. A percutaneous catheter overlying the LOWER LEFT abdomen is again noted. IMPRESSION: NG tube with tip overlying the mid-distal stomach. Electronically Signed   By: Margarette Canada M.D.   On: 06/27/2020 09:35  DG Abd Portable 1V  Result  Date: 06/28/2020 CLINICAL DATA:  Small-bowel obstruction EXAM: PORTABLE ABDOMEN - 1 VIEW COMPARISON:  the previous day's study FINDINGS: Nasogastric tube extends to the pylorus. Stomach is decompressed. Few gas filled small bowel loops in the mid abdomen. No definite dilated loops of bowel. Colon decompressed. Ostomy devices project left mid and right lower abdomen. Percutaneous pigtail catheter abscess drain stable in the left lower quadrant. IMPRESSION: Nonobstructed bowel gas pattern Electronically Signed   By: Lucrezia Europe M.D.   On: 06/28/2020 08:01     LOS: 2 days   Georgette Shell, MD [facility]  06/28/2020, 12:25 PM

## 2020-06-29 LAB — BASIC METABOLIC PANEL
Anion gap: 10 (ref 5–15)
BUN: 15 mg/dL (ref 8–23)
CO2: 28 mmol/L (ref 22–32)
Calcium: 9.9 mg/dL (ref 8.9–10.3)
Chloride: 105 mmol/L (ref 98–111)
Creatinine, Ser: 0.99 mg/dL (ref 0.61–1.24)
GFR calc Af Amer: >60 mL/min (ref >60)
GFR calc non Af Amer: >60 mL/min (ref >60)
Glucose, Bld: 77 mg/dL (ref 70–99)
Potassium: 3.3 mmol/L — ABNORMAL LOW (ref 3.5–5.1)
Sodium: 143 mmol/L (ref 135–145)

## 2020-06-29 LAB — CBC
HCT: 23.3 % — ABNORMAL LOW (ref 39.0–52.0)
Hemoglobin: 7.3 g/dL — ABNORMAL LOW (ref 13.0–17.0)
MCH: 30.4 pg (ref 26.0–34.0)
MCHC: 31.3 g/dL (ref 30.0–36.0)
MCV: 97.1 fL (ref 80.0–100.0)
Platelets: 350 10*3/uL (ref 150–400)
RBC: 2.4 MIL/uL — ABNORMAL LOW (ref 4.22–5.81)
RDW: 14.3 % (ref 11.5–15.5)
WBC: 3.2 10*3/uL — ABNORMAL LOW (ref 4.0–10.5)
nRBC: 0 % (ref 0.0–0.2)

## 2020-06-29 MED ORDER — POTASSIUM CHLORIDE 10 MEQ/100ML IV SOLN
10.0000 meq | INTRAVENOUS | Status: AC
  Administered 2020-06-29 (×2): 10 meq via INTRAVENOUS
  Filled 2020-06-29: qty 100

## 2020-06-29 NOTE — Progress Notes (Signed)
Progress Note: General Surgery Service   Chief Complaint/Subjective: No nausea or vomiting, minimal output from ostomy  Objective: Vital signs in last 24 hours: Temp:  [97.9 F (36.6 C)-98.8 F (37.1 C)] 97.9 F (36.6 C) (07/12 0520) Pulse Rate:  [54-55] 55 (07/12 0520) Resp:  [18] 18 (07/12 0520) BP: (125-145)/(70-87) 145/87 (07/12 0520) SpO2:  [100 %] 100 % (07/12 0520) Last BM Date: 06/29/20  Intake/Output from previous day: 07/11 0701 - 07/12 0700 In: 1125 [P.O.:120; I.V.:1000] Out: 600 [Emesis/NG output:500; Stool:100] Intake/Output this shift: No intake/output data recorded.  Gen: NAD  Resp: nonlabored  Card: bradycardic  Abd: soft, NT, ostomy with small amount of brown liquid in bag  Lab Results: CBC  Recent Labs    06/28/20 0305 06/29/20 0308  WBC 4.3 3.2*  HGB 7.4* 7.3*  HCT 23.8* 23.3*  PLT 361 350   BMET Recent Labs    06/28/20 0305 06/29/20 0308  NA 143 143  K 3.3* 3.3*  CL 106 105  CO2 29 28  GLUCOSE 88 77  BUN 14 15  CREATININE 1.02 0.99  CALCIUM 10.1 9.9   PT/INR No results for input(s): LABPROT, INR in the last 72 hours. ABG No results for input(s): PHART, HCO3 in the last 72 hours.  Invalid input(s): PCO2, PO2  Anti-infectives: Anti-infectives (From admission, onward)   Start     Dose/Rate Route Frequency Ordered Stop   06/26/20 0900  fluconazole (DIFLUCAN) IVPB 200 mg     Discontinue     200 mg 100 mL/hr over 60 Minutes Intravenous Every 24 hours 06/26/20 0829 07/03/20 0859   06/26/20 0830  ciprofloxacin (CIPRO) IVPB 400 mg     Discontinue     400 mg 200 mL/hr over 60 Minutes Intravenous Every 12 hours 06/26/20 0829        Medications: Scheduled Meds: . Chlorhexidine Gluconate Cloth  6 each Topical Daily  . enoxaparin (LOVENOX) injection  40 mg Subcutaneous Q24H  . pantoprazole (PROTONIX) IV  40 mg Intravenous Q24H  . sodium chloride flush  10-40 mL Intracatheter Q12H   Continuous Infusions: . ciprofloxacin 400 mg  (06/29/20 0820)  . fluconazole (DIFLUCAN) IV 200 mg (06/29/20 1311)  . 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  Should alert IR of patient's admission so they can follow his drain  Partial small bowel obstruction The patient appears to likely have a partial small bowel obstruction as he is having N/V but he is still also having semi-solid feculent output in his ileostomy pouch. He is working with Dr. [name] for possible cytoreductive surgery. He was initially to be transferred to [facility] on Saturday but the patient did not want to go then. I continue to recommend his transfer to [facility] is his best option for optimized treatment of this advanced cancer  Hypokalemia -replace K  FEN - NPO/NGT/ice chips VTE - ok for chemical prophylaxis from our standpoint ID - cipro, for pelvic collection   LOS: 3 days   Mickeal Skinner, MD [facility]

## 2020-06-29 NOTE — Progress Notes (Signed)
[facility] transfer service called to set patient up to transfer, because there was a bed that opened up. When patient notified he did not wanted to be transferred in the middle of the night, but was fine to go in the morning.   Transfer center notified and stated they could not hold a bed for him, but there is a chance that tomorrow one will open up.

## 2020-06-29 NOTE — Discharge Summary (Signed)
Physician Discharge Summary  Juan Richards XVQ:[id number] DOB: [birth year] DOA: 06/26/2020  PCP: [name], MD  Admit date: 06/26/2020 Discharge date: 06/30/2020  Admitted From: home Disposition:  [facility]  Recommendations for Outpatient Follow-up:  1. Follow up with surgery at [facility] 2. Please obtain BMP/CBC in one week 3. Please follow up on the following pending results:  Home Health:No  Equipment/Devices: None  Discharge Condition: Stable Code Status: FULL Diet recommendation:  Diet Order            Diet NPO time specified  Diet effective now                  Brief/Interim Summary: 64 y.o.malewith PMH significant for metastatic appendix cancer with abdominal carcinomatosis who was admitted on 06/19/20 for pelvic abscess treated with IR drain,cultures grew out E. coli sensitive to Cipro and patient was discharged on Cipro on 06/23/20 FOR 12 MORE DAYS. He returned to the ED after noting significant decrease in his ostomy output.  Normally at 6-7 bags of output a day and decreased to 1.  With associated nausea and vomiting. ED Course:The patient was noted to be afebrile with otherwise normal vital signs. 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 Patient was discussed with general surgery who recommended admission by [facility]. He was treated with morphine and NG tube placement.Patient is reluctant for transfer.  Transfer was initiated on 06/27/2020 and he had a bed available at [facility] however patient did not want to be transferred that day 7/12-patient agreed to start transfer process again.  Discussed Dr. [name] he has accepted the patient pending bed availability.  Followed by surgery ,no emergent need for surgery. Changed the colostomy bag x1 yesterday has some liquid output this  morning. Patient has a bed at [facility] and he is being transferred today as per the recommendation of general surgery here. I discussed with the surgery team THIS AM.  Discharge Diagnoses:  Partial SBO with abdominal carcinomatosis:Surgery following, remains on NG tube, n.p.o. IV fluids. Per Gen Surgery "there's offer of CRS and/or HIPEC with Dr. [name] and consider transfer". Patient has been reluctant for transfer-and had declined transfer on 06/27/20 when Dr. Carlis Abbott from [facility] accepted him and there was a bed at [facility] that day- I had told him that it will be difficult to get bed- and bed may not open up at [facility] when we needed to transfer and there may be hold-up/delay in care 2/2 that.  I discussed with Dr. Lennie Odor from [facility] 7/12- he has accepted the patient for transfer but there was no bed. 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  Follow-up with oncology/surgery  Pelvic abscess recently treated with IR drain cultures grew E. coli patient was discharged on Cipro to complete the course through 07/05/20. Fluid collections with drain still in place resolved in the CT scan.  Drain management per surgery. Continue IV Cipro.    Hypokalemia: Being repleted with 40 M EQ KCl iv this morning AKI-resolved. GERD-continue PPI Slightly abnormal EKG with V3 ST segment somewhat  concave biphasic T waves in V4 but no chest pain and troponin stable  Consults:  Gen Surgery  Subjective: No nausea vomiting.  Remains with NG tube in place.  Abdominal pain is controlled. ChangeD the colostomy bag x1 yesterday haS some liquid output this morning. Discharge  Exam: Vitals:   06/29/20 2324 06/30/20 0529  BP: (!) 142/79 134/83  Pulse: (!) 54 (!) 51  Resp: 16 18  Temp: 98.7 F (37.1 C) 98.1 F (36.7 C)  SpO2: 99% 100%   General: Pt is alert, awake, not in acute distress.  NG tube in place Cardiovascular: RRR, S1/S2 +, no rubs, no gallops Respiratory: CTA bilaterally, no wheezing, no rhonchi Abdominal: Soft, mildly tender, previous scar present, right colostomy with some liquidy output, left ilesotomy in place- no output, bowel sounds sluggish Extremities: no edema, no cyanosis  Discharge Instructions  Discharge Instructions    Discharge instructions   Complete by: As directed    Being discharged to [facility] for further surgical evaluation under surgery service     Allergies as of 06/30/2020   No Known Allergies     Medication List    STOP taking these medications   cephALEXin 500 MG capsule Commonly known as: KEFLEX   ciprofloxacin 500 MG tablet Commonly known as: CIPRO   fluconazole 200 MG tablet Commonly known as: DIFLUCAN     TAKE these medications   acetaminophen 500 MG tablet Commonly known as: TYLENOL You can take 1000 mg of Tylenol/acetaminophen every 6 hours as needed for pain.  You can buy this over-the-counter at any drugstore.  Do not take more than 4000 mg of Tylenol/acetaminophen per day, it can harm your liver. What changed:   how much to take  how to take this  when to take this  reasons to take this  additional instructions   ciprofloxacin 400 MG/200ML Soln Commonly known as: CIPRO Inject 200 mLs (400 mg total) into the vein every 12 (twelve) hours.   diphenoxylate-atropine 2.5-0.025 MG tablet Commonly known as: LOMOTIL Take 2 tablets by mouth 4 (four) times daily as needed for diarrhea or loose stools.   fluconazole 200-0.9 MG/100ML-% IVPB Commonly known as: DIFLUCAN Inject 100 mLs (200 mg total) into the vein daily.   lidocaine-prilocaine cream Commonly known as:  EMLA Apply 1 application topically as needed. What changed: reasons to take this   magnesium oxide 400 MG tablet Commonly known as: MAG-OX Take 400 mg by mouth daily.   multivitamin with minerals Tabs tablet Take 1 tablet by mouth daily.   ondansetron 8 MG disintegrating tablet Commonly known as: ZOFRAN-ODT Take 8 mg by mouth every 8 (eight) hours as needed for nausea or vomiting.   oxyCODONE 5 MG immediate release tablet Commonly known as: Oxy IR/ROXICODONE Take 1 tablet (5 mg total) by mouth every 4 (four) hours as needed for breakthrough pain (Pain not relieved by plain Tylenol).   pantoprazole 40 MG tablet Commonly known as: PROTONIX Take 1 tablet (40 mg total) by mouth daily.   phenazopyridine 200 MG tablet Commonly known as: PYRIDIUM TAKE 1 TABLET BY MOUTH THREE TIMES DAILY AS NEEDED   prochlorperazine 10 MG tablet Commonly known as: COMPAZINE Take 1 tablet (10 mg total) by mouth every 6 (six) hours as needed for nausea.       No Known Allergies  The results of significant diagnostics from this hospitalization (including imaging, microbiology, ancillary and laboratory) are listed below for reference.    Microbiology: Recent Results (from  the past 240 hour(s))  Aerobic/Anaerobic Culture (surgical/deep wound)     Status: None   Collection Time: 06/20/20 12:45 PM   Specimen: Abscess  Result Value Ref Range Status   Specimen Description   Final    ABSCESS Performed at [facility] [address] [place], [place] [address]    Special Requests   Final    NONE Performed at [facility], [place] [address] [place], [place] [address]    Gram Stain   Final    ABUNDANT WBC PRESENT, PREDOMINANTLY PMN MODERATE GRAM POSITIVE COCCI IN PAIRS FEW GRAM NEGATIVE RODS RARE GRAM POSITIVE RODS    Culture   Final    ABUNDANT ESCHERICHIA COLI ABUNDANT BACTEROIDES SPECIES NOT FRAGILIS BETA LACTAMASE POSITIVE Performed at [facility], [place] [address], [place], [place] [address]    Report Status 06/23/2020 FINAL  Final   Organism ID, Bacteria ESCHERICHIA COLI  Final      Susceptibility   Escherichia coli - MIC*    AMPICILLIN >=32 RESISTANT Resistant     CEFAZOLIN <=4 SENSITIVE Sensitive     CEFEPIME <=0.12 SENSITIVE Sensitive     CEFTAZIDIME <=1 SENSITIVE Sensitive     CEFTRIAXONE <=0.25 SENSITIVE Sensitive     CIPROFLOXACIN <=0.25 SENSITIVE Sensitive     GENTAMICIN >=16 RESISTANT Resistant     IMIPENEM <=0.25 SENSITIVE Sensitive     TRIMETH/SULFA >=320 RESISTANT Resistant     AMPICILLIN/SULBACTAM >=32 RESISTANT Resistant     PIP/TAZO <=4 SENSITIVE Sensitive     * ABUNDANT ESCHERICHIA COLI  SARS Coronavirus 2 by RT PCR (hospital order, performed in [place] hospital lab) Nasopharyngeal Nasopharyngeal Swab     Status: None   Collection Time: 06/26/20  7:45 AM   Specimen: Nasopharyngeal Swab  Result Value Ref Range Status   SARS Coronavirus 2 NEGATIVE NEGATIVE Final    Comment: (NOTE) SARS-CoV-2 target nucleic acids are NOT DETECTED.  The SARS-CoV-2 RNA is generally detectable in upper and lower respiratory specimens during the acute phase of infection. The lowest concentration of SARS-CoV-2 viral copies this assay can detect is 250 copies / mL. A negative result does not preclude SARS-CoV-2 infection and should not be used as the sole basis for treatment or other patient management decisions.  A negative result may occur with improper specimen collection / handling, submission of specimen other than nasopharyngeal swab, presence of viral mutation(s) within the areas targeted by this assay, and inadequate number of viral copies (<250 copies / mL). A negative result must be combined with clinical observations, patient history, and epidemiological information.  Fact Sheet for Patients:   StrictlyIdeas.no  Fact Sheet for Healthcare  Providers: BankingDealers.co.za  This test is not yet approved or  cleared by the [place] FDA and has been authorized for detection and/or diagnosis of SARS-CoV-2 by FDA under an Emergency Use Authorization (EUA).  This EUA will remain in effect (meaning this test can be used) for the duration of the COVID-19 declaration under Section 564(b)(1) of the Act, 21 U.S.C. section 360bbb-3(b)(1), unless the authorization is terminated or revoked sooner.  Performed at [facility], [place] [address], [place], [place] [address]     Procedures/Studies: DG Abdomen 1 View  Result Date: 06/26/2020 CLINICAL DATA:  NG tube placement. EXAM: ABDOMEN - 1 VIEW COMPARISON:  CT 06/26/2020. FINDINGS: PowerPort catheter noted with tip over SVC. NG tube noted with tip over the proximal stomach. Side hole at the gastroesophageal junction. Advancement  of approximately 10 cm suggested. Contrast from prior CT noted in the renal collecting systems. Bilateral hydronephrosis again noted. IMPRESSION: 1. NG tube noted with tip over the upper stomach. Side hole is at the gastroesophageal junction. NG tube advancement of approximately 10 cm suggested. 2.  Bilateral hydronephrosis again noted. Electronically Signed   By: Marcello Moores  Register   On: 06/26/2020 07:40   CT ABDOMEN PELVIS W CONTRAST  Result Date: 06/26/2020 CLINICAL DATA:  Mid abdominal pain with nausea and vomiting. Decreased colostomy output EXAM: CT ABDOMEN AND PELVIS WITH CONTRAST TECHNIQUE: Multidetector CT imaging of the abdomen and pelvis was performed using the standard protocol following bolus administration of intravenous contrast. CONTRAST:  156mL OMNIPAQUE IOHEXOL 300 MG/ML  SOLN COMPARISON:  06/19/2020 FINDINGS: Lower chest:  No contributory findings. Hepatobiliary: No focal liver abnormality.Increased density in the dependent gallbladder likely reflecting sludge. No calcified stone or regional inflammation. Pancreas:  Unremarkable. Spleen: Unremarkable. Adrenals/Urinary Tract: Negative adrenals. Right hydronephrosis and left pelviectasis which is chronic but more prominent than on comparison. The upper aspect of the bladder has prominent wall thickness, stable and nonspecific. 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 Fluid-filled and dilated loops of bowel with transition in the right abdomen where there is clustering of bowel loops and serosal based indistinctness and sheet like enhancement. Minimal contained fluid in the left upper quadrant peritoneal space. Thick-walled sigmoid colon with multiple diverticula. Presumed diverticulitis with diverticular abscess along the left iliacus which is been completely decompressed by percutaneous catheter. Subhepatic gas appears to be within bowel lumen on coronal reformats. Vascular/Lymphatic: No acute vascular abnormality. No mass or adenopathy. Reproductive:Prostatectomy. Other: Peritoneal findings noted above. Musculoskeletal: No acute abnormalities.  Lumbar spine degeneration. IMPRESSION: 1. Small bowel obstruction with transition at the level of multiple clumped right-sided small bowel loops that is likely related to adhesions and peritoneal disease. 2. Recent percutaneous drainage of a left iliopsoas abscess with resolved fluid collection. 3. Stable right hydronephrosis and left pelviectasis with bladder wall thickening. Electronically Signed   By: Monte Fantasia M.D.   On: 06/26/2020 06:08   DG Abd 2 Views  Result Date: 06/27/2020 CLINICAL DATA:  NG tube placement.  Small-bowel obstruction. EXAM: ABDOMEN - 2 VIEW COMPARISON:  Prior studies FINDINGS: An NG tube is identified with tip overlying the mid-distal stomach. No other significant change noted. No dilated bowel loops are identified. A percutaneous catheter overlying the LOWER LEFT abdomen is again noted. IMPRESSION: NG tube with tip overlying the mid-distal stomach.  Electronically Signed   By: Margarette Canada M.D.   On: 06/27/2020 09:35   DG Abd Portable 1V  Result Date: 06/28/2020 CLINICAL DATA:  Small-bowel obstruction EXAM: PORTABLE ABDOMEN - 1 VIEW COMPARISON:  the previous day's study FINDINGS: Nasogastric tube extends to the pylorus. Stomach is decompressed. Few gas filled small bowel loops in the mid abdomen. No definite dilated loops of bowel. Colon decompressed. Ostomy devices project left mid and right lower abdomen. Percutaneous pigtail catheter abscess drain stable in the left lower quadrant. IMPRESSION: Nonobstructed bowel gas pattern Electronically Signed   By: Lucrezia Europe M.D.   On: 06/28/2020 08:01   CT IMAGE GUIDED DRAINAGE BY PERCUTANEOUS CATHETER  Result Date: 06/20/2020 INDICATION: 64 year old male with a history of left lower quadrant fluid collection EXAM: CT GUIDED DRAINAGE OF PELVIC ABSCESS MEDICATIONS: The patient is currently admitted to the hospital and receiving intravenous antibiotics. The antibiotics were administered within an appropriate time frame prior to the initiation of the procedure. ANESTHESIA/SEDATION:  1.0 mg IV Versed 50 mcg IV Fentanyl Moderate Sedation Time:  20 minutes The patient was continuously monitored during the procedure by the interventional radiology nurse under my direct supervision. COMPLICATIONS: None TECHNIQUE: Informed written consent was obtained from the patient after a thorough discussion of the procedural risks, benefits and alternatives. All questions were addressed. Maximal Sterile Barrier Technique was utilized including caps, mask, sterile gowns, sterile gloves, sterile drape, hand hygiene and skin antiseptic. A timeout was performed prior to the initiation of the procedure. PROCEDURE: The operative field was prepped with Chlorhexidine in a sterile fashion, and a sterile drape was applied covering the operative field. A sterile gown and sterile gloves were used for the procedure. Local anesthesia was provided  with 1% Lidocaine. Patient positioned supine position. CT scan was acquired for planning purposes. Once the patient was prepped and draped in the usual sterile fashion, 1% lidocaine was used for local anesthesia. Using CT guidance, trocar needle was advanced into the fluid collection of the left lower quadrant. Modified Seldinger technique was used to place a 10 [place] drain into the collection. Approximately 30 cc of frank pus was aspirated. Saline wash was performed for a cytology sample. Culture was sent and cytology sample sent. Catheter was sutured in position and attached to bulb suction. Final CT was acquired. Patient tolerated the procedure well and remained hemodynamically stable throughout. No complications were encountered and no significant blood loss. FINDINGS: CT demonstrates ill-defined fluid collection corresponding to the prior contrast-enhanced CT, involving the left ileo psoas musculature. Approximately 30 cc of purulent material aspirated. IMPRESSION: Status post CT-guided drainage of abscess of the left lower quadrant with both cytology sample and culture sent. Signed, Dulcy Fanny. Dellia Nims, RPVI [facility] Electronically Signed   By: Corrie Mckusick D.O.   On: 06/20/2020 13:30   CT OUTSIDE FILMS BODY/ABD/PELVIS  Result Date: 06/19/2020 This examination belongs to an outside facility and is stored here for comparison purposes only.  Contact the originating outside institution for any associated report or interpretation.   Labs: BNP (last 3 results) No results for input(s): BNP in the last 8760 hours. Basic Metabolic Panel: Recent Labs  Lab 06/26/20 0405 06/27/20 0827 06/28/20 0305 06/29/20 0308 06/30/20 0407  NA 137 139 143 143 142  K 3.6 3.6 3.3* 3.3* 3.1*  CL 102 107 106 105 101  CO2 27 24 29 28 28   GLUCOSE 115* 91 88 77 74  BUN 14 13 14 15 14   CREATININE 1.00 0.99 1.02 0.99 0.89  CALCIUM 10.6* 9.8 10.1 9.9 9.7   MG  --   --  1.4*  --   --    Liver Function Tests: Recent Labs  Lab 06/26/20 0405 06/27/20 0827  AST 23 17  ALT 40 26  ALKPHOS 132* 102  BILITOT 0.4 0.5  PROT 8.0 6.7  ALBUMIN 3.4* 2.7*   Recent Labs  Lab 06/26/20 0405  LIPASE 28   No results for input(s): AMMONIA in the last 168 hours. CBC: Recent Labs  Lab 06/26/20 0405 06/27/20 0827 06/28/20 0305 06/29/20 0308 06/30/20 0407  WBC 6.4 4.5 4.3 3.2* 4.3  NEUTROABS 4.6  --   --   --   --   HGB 8.9* 7.9* 7.4* 7.3* 7.4*  HCT 28.0* 25.0* 23.8* 23.3* 23.4*  MCV 94.6 97.7 96.7 97.1 94.7  PLT 483* 378 361 350 310   Cardiac Enzymes: No results for input(s): CKTOTAL, CKMB, CKMBINDEX, TROPONINI in the last 168  hours. BNP: Invalid input(s): POCBNP CBG: No results for input(s): GLUCAP in the last 168 hours. D-Dimer No results for input(s): DDIMER in the last 72 hours. Hgb A1c No results for input(s): HGBA1C in the last 72 hours. Lipid Profile No results for input(s): CHOL, HDL, LDLCALC, TRIG, CHOLHDL, LDLDIRECT in the last 72 hours. Thyroid function studies No results for input(s): TSH, T4TOTAL, T3FREE, THYROIDAB in the last 72 hours.  Invalid input(s): FREET3 Anemia work up No results for input(s): VITAMINB12, FOLATE, FERRITIN, TIBC, IRON, RETICCTPCT in the last 72 hours. Urinalysis    Component Value Date/Time   COLORURINE YELLOW 06/26/2020 0840   APPEARANCEUR CLEAR 06/26/2020 0840   LABSPEC 1.035 (H) 06/26/2020 0840   PHURINE 5.0 06/26/2020 0840   GLUCOSEU NEGATIVE 06/26/2020 0840   GLUCOSEU NEGATIVE 07/22/2009 1428   HGBUR NEGATIVE 06/26/2020 0840   BILIRUBINUR NEGATIVE 06/26/2020 0840   KETONESUR NEGATIVE 06/26/2020 0840   PROTEINUR NEGATIVE 06/26/2020 0840   UROBILINOGEN 2.0 (H) 07/24/2009 0304   NITRITE NEGATIVE 06/26/2020 0840   LEUKOCYTESUR NEGATIVE 06/26/2020 0840   Sepsis Labs Invalid input(s): PROCALCITONIN,  WBC,  LACTICIDVEN Microbiology Recent Results (from the past 240 hour(s))   Aerobic/Anaerobic Culture (surgical/deep wound)     Status: None   Collection Time: 06/20/20 12:45 PM   Specimen: Abscess  Result Value Ref Range Status   Specimen Description   Final    ABSCESS Performed at [facility], [place] [address] [place], [place] [address]    Special Requests   Final    NONE Performed at [facility], [place] [address] [place], [place] [address]    Gram Stain   Final    ABUNDANT WBC PRESENT, PREDOMINANTLY PMN MODERATE GRAM POSITIVE COCCI IN PAIRS FEW GRAM NEGATIVE RODS RARE GRAM POSITIVE RODS    Culture   Final    ABUNDANT ESCHERICHIA COLI ABUNDANT BACTEROIDES SPECIES NOT FRAGILIS BETA LACTAMASE POSITIVE Performed at [facility], [place] [address] [place],  [address]    Report Status 06/23/2020 FINAL  Final   Organism ID, Bacteria ESCHERICHIA COLI  Final      Susceptibility   Escherichia coli - MIC*    AMPICILLIN >=32 RESISTANT Resistant     CEFAZOLIN <=4 SENSITIVE Sensitive     CEFEPIME <=0.12 SENSITIVE Sensitive     CEFTAZIDIME <=1 SENSITIVE Sensitive     CEFTRIAXONE <=0.25 SENSITIVE Sensitive     CIPROFLOXACIN <=0.25 SENSITIVE Sensitive     GENTAMICIN >=16 RESISTANT Resistant     IMIPENEM <=0.25 SENSITIVE Sensitive     TRIMETH/SULFA >=320 RESISTANT Resistant     AMPICILLIN/SULBACTAM >=32 RESISTANT Resistant     PIP/TAZO <=4 SENSITIVE Sensitive     * ABUNDANT ESCHERICHIA COLI  SARS Coronavirus 2 by RT PCR (hospital order, performed in [place] hospital lab) Nasopharyngeal Nasopharyngeal Swab     Status: None   Collection Time: 06/26/20  7:45 AM   Specimen: Nasopharyngeal Swab  Result Value Ref Range Status   SARS Coronavirus 2 NEGATIVE NEGATIVE Final    Comment: (NOTE) SARS-CoV-2 target nucleic acids are NOT DETECTED.  The SARS-CoV-2 RNA is generally detectable in upper and lower respiratory specimens during the acute phase of infection. The lowest concentration of SARS-CoV-2 viral copies  this assay can detect is 250 copies / mL. A negative result does not preclude SARS-CoV-2 infection and should not be used as the sole basis for treatment or other patient management decisions.  A negative result may occur with improper specimen collection /  handling, submission of specimen other than nasopharyngeal swab, presence of viral mutation(s) within the areas targeted by this assay, and inadequate number of viral copies (<250 copies / mL). A negative result must be combined with clinical observations, patient history, and epidemiological information.  Fact Sheet for Patients:   StrictlyIdeas.no  Fact Sheet for Healthcare Providers: BankingDealers.co.za  This test is not yet approved or  cleared by the [place] FDA and has been authorized for detection and/or diagnosis of SARS-CoV-2 by FDA under an Emergency Use Authorization (EUA).  This EUA will remain in effect (meaning this test can be used) for the duration of the COVID-19 declaration under Section 564(b)(1) of the Act, 21 U.S.C. section 360bbb-3(b)(1), unless the authorization is terminated or revoked sooner.  Performed at [facility], [place] [address], [place], [place] [address]     Time coordinating discharge: 35  minutes  SIGNED: Antonieta Pert, MD  [facility] 06/30/2020, 8:52 AM  If 7PM-7AM, please contact night-coverage [email or web address]

## 2020-06-29 NOTE — Progress Notes (Signed)
PROGRESS NOTE    Juan Richards  AYT:[id number] DOB: March 15, 1956 DOA: 06/26/2020 PCP: [name], MD   Chef Complaints:Abdominal pain  Brief Narrative: as per admitting 64 y.o. male with PMH significant for metastatic appendix cancer with abdominal carcinomatosis who was admitted on 06/19/20 for pelvic abscess treated with IR drain,cultures grew out E. coli sensitive to Cipro and patient was discharged on Cipro on 06/23/20 FOR 12 MORE DAYS. He returned to the ED after noting significant decrease in his ostomy output.  Normally at 6-7 bags of output a day and decreased to 1.  With associated nausea and vomiting. ED Course:  The patient was noted to be afebrile with otherwise normal vital signs.  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  Fluid collection with drain still in place had resolved. Patient was discussed with general surgery who recommended admission by [facility].  He was treated with morphine and NG tube placement.  Patient is reluctant for transfer.  Transfer was initiated on 06/27/2020 and he has a bed available at [facility] however patient did not want to be transferred that day.  Subjective:  NG output 500 ml. Reports he changed the rt colostomy bag yesterday x1 and today x1, some output but not like he normally get  at home. NGT+ no nausea or vomiting, pain controlled. Agrees for transfer to [facility] today and I made  Call and left message for [facility] transfer center.   Assessment & Plan:  Partial SBO with abdominal carcinomatosis:Surgery following, remains on NG tube, n.p.o. IV fluids. Per Gen Surgery "there's offer of CRS and/or HIPEC with Dr. [name] and consider transfer". Patient has been reluctant for transfer-and had declined transfer on 06/27/20 when Dr. Carlis Abbott from [facility] accepted him and there was a bed at [facility] that day- I had told him that it  will be difficult to get bed- and bed may not open up at [facility] when we needed to transfer and there may be hold-up/delay in care 2/2 that.  I discussed with Dr. Lennie Odor from [facility] who has accepted the patient for transfer but there is no beds so he will be in the waiting list. I discussed w/ Dr Alvino Blood from surgery currently no emergent need for surgery and he will continue to manage his partial small bowel obstruction, patient having semisolid feculent material in his right colostomy, and plan is to transfer to [facility] for optimize treatment of his advanced cancer.  If remains n.p.o. for few more days may need to consider TPN  Hypokalemia at 3.3 replacing potassium  Anemia of chronic disease hemoglobin is downtrending.  At 7.3 g monitor and transfuse if less than 7 g.  Recent Labs  Lab 06/23/20 0630 06/26/20 0405 06/27/20 0827 06/28/20 0305 06/29/20 0308  HGB 7.6* 8.9* 7.9* 7.4* 7.3*  HCT 24.2* 28.0* 25.0* 23.8* 23.3*   AKI: Reports remains a stable and improved Recent Labs  Lab 06/23/20 0630 06/26/20 0405 06/27/20 0827 06/28/20 0305 06/29/20 0308  BUN 14 14 13 14 15   CREATININE 0.76 1.00 0.99 1.02 0.99   Slightly abnormal EKG with V3 ST segment somewhat concave biphasic T waves in V4 but no chest pain and troponin stable  GERD continue PPI IV.  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  Follow-up with oncology/surgery  History of prostate cancer status post prostatectomy in 2019  Pelvic abscess recently treated with IR drain cultures grew E. coli patient was discharged on Cipro to complete the course through 7) 21.  Fluid collections with drain still in place usually in the CT scan.  Drain management per surgery.  Continue IV Cipro.    Oral  candidiasis last discharge: Patient to continue total 7 days of Diflucan.   [facility] is following.  Appreciate input.  DVT prophylaxis: enoxaparin (LOVENOX) injection 40 mg Start: 06/26/20 1000 Code Status: FULL Family Communication: plan of care discussed with patient at bedside.  Patient was updated regarding the bed situation and that he was accepted for transfer but no bed yet.  Status is: Inpatient  Remains inpatient appropriate because:IV treatments appropriate due to intensity of illness or inability to take PO and Inpatient level of care appropriate due to severity of illness  Dispo: The patient is from: Home              Anticipated d/c is to: [place]              Anticipated d/c date is: > 3 days              Patient currently is not medically stable to d/c.   Nutrition: Diet Order            Diet NPO time specified  Diet effective now                 Nutrition Problem: Increased nutrient needs Etiology: chronic illness (cancer) Signs/Symptoms: estimated needs Interventions: Refer to RD note for recommendations Body mass index is 25.68 kg/m.  Consultants:see note  Procedures:see note Microbiology:see note  Medications: Scheduled Meds: . Chlorhexidine Gluconate Cloth  6 each Topical Daily  . enoxaparin (LOVENOX) injection  40 mg Subcutaneous Q24H  . pantoprazole (PROTONIX) IV  40 mg Intravenous Q24H  . sodium chloride flush  10-40 mL Intracatheter Q12H   Continuous Infusions: . ciprofloxacin 400 mg (06/29/20 0820)  . fluconazole (DIFLUCAN) IV 200 mg (06/28/20 0941)  . lactated ringers 125 mL/hr at 06/28/20 2230  . potassium chloride 10 mEq (06/29/20 1206)    Antimicrobials: Anti-infectives (From admission, onward)   Start     Dose/Rate Route Frequency Ordered Stop   06/26/20 0900  fluconazole (DIFLUCAN) IVPB 200 mg     Discontinue     200 mg 100 mL/hr over 60 Minutes Intravenous Every 24 hours 06/26/20 0829 07/03/20 0859    06/26/20 0830  ciprofloxacin (CIPRO) IVPB 400 mg     Discontinue     400 mg 200 mL/hr over 60 Minutes Intravenous Every 12 hours 06/26/20 0829         Objective: Vitals: Today's Vitals   06/28/20 2006 06/28/20 2100 06/29/20 0520 06/29/20 0824  BP: 125/70  (!) 145/87   Pulse: (!) 54  (!) 55   Resp: 18  18   Temp: 98.8 F (37.1 C)  97.9 F (36.6 C)   TempSrc: Oral  Oral   SpO2: 100%  100%   Weight:      Height:      PainSc:  5   4     Intake/Output Summary (Last 24 hours) at 06/29/2020 1251 Last data filed at 06/29/2020 0824 Gross per 24 hour  Intake 1120 ml  Output 450 ml  Net 670 ml   Filed  Weights   06/26/20 0251 06/26/20 0339  Weight: 81.2 kg 81.2 kg   Weight change:    Intake/Output from previous day: 07/11 0701 - 07/12 0700 In: 1125 [P.O.:120; I.V.:1000] Out: 600 [Emesis/NG output:500; Stool:100] Intake/Output this shift: No intake/output data recorded.  Examination:  General exam: AAOx3, frail thin,NAD, weak appearing. HEENT:Oral mucosa moist, Ear/Nose WNL grossly, dentition normal. Respiratory system: bilaterally clear,no wheezing or crackles,no use of accessory muscle Cardiovascular system: S1 & S2 +, No JVD,. Gastrointestinal system: Abdomen soft, right colostomy with small amount of feculent material in the colostomy, left ileostomy with no output NT,ND, BS+. JP drain+.  Scar present on previous surgery. Nervous System:Alert, awake, moving extremities and grossly nonfocal Extremities: No edema, distal peripheral pulses palpable.  Skin: No rashes,no icterus. MSK: Normal muscle bulk,tone, power  Data Reviewed: I have personally reviewed following labs and imaging studies CBC: Recent Labs  Lab 06/23/20 0630 06/26/20 0405 06/27/20 0827 06/28/20 0305 06/29/20 0308  WBC 5.1 6.4 4.5 4.3 3.2*  NEUTROABS  --  4.6  --   --   --   HGB 7.6* 8.9* 7.9* 7.4* 7.3*  HCT 24.2* 28.0* 25.0* 23.8* 23.3*  MCV 97.6 94.6 97.7 96.7 97.1  PLT 383 483* 378 361 284    Basic Metabolic Panel: Recent Labs  Lab 06/23/20 0630 06/26/20 0405 06/27/20 0827 06/28/20 0305 06/29/20 0308  NA 133* 137 139 143 143  K 4.0 3.6 3.6 3.3* 3.3*  CL 102 102 107 106 105  CO2 23 27 24 29 28   GLUCOSE 95 115* 91 88 77  BUN 14 14 13 14 15   CREATININE 0.76 1.00 0.99 1.02 0.99  CALCIUM 9.8 10.6* 9.8 10.1 9.9  MG  --   --   --  1.4*  --    GFR: Estimated Creatinine Clearance: 77.8 mL/min (by C-G formula based on SCr of 0.99 mg/dL). Liver Function Tests: Recent Labs  Lab 06/26/20 0405 06/27/20 0827  AST 23 17  ALT 40 26  ALKPHOS 132* 102  BILITOT 0.4 0.5  PROT 8.0 6.7  ALBUMIN 3.4* 2.7*   Recent Labs  Lab 06/26/20 0405  LIPASE 28   No results for input(s): AMMONIA in the last 168 hours. Coagulation Profile: No results for input(s): INR, PROTIME in the last 168 hours. Cardiac Enzymes: No results for input(s): CKTOTAL, CKMB, CKMBINDEX, TROPONINI in the last 168 hours. BNP (last 3 results) No results for input(s): PROBNP in the last 8760 hours. HbA1C: No results for input(s): HGBA1C in the last 72 hours. CBG: No results for input(s): GLUCAP in the last 168 hours. Lipid Profile: No results for input(s): CHOL, HDL, LDLCALC, TRIG, CHOLHDL, LDLDIRECT in the last 72 hours. Thyroid Function Tests: No results for input(s): TSH, T4TOTAL, FREET4, T3FREE, THYROIDAB in the last 72 hours. Anemia Panel: No results for input(s): VITAMINB12, FOLATE, FERRITIN, TIBC, IRON, RETICCTPCT in the last 72 hours. Sepsis Labs: Recent Labs  Lab 06/26/20 0405 06/26/20 0545  LATICACIDVEN 1.1 1.1    Recent Results (from the past 240 hour(s))  SARS Coronavirus 2 by RT PCR (hospital order, performed in [facility] hospital lab) Nasopharyngeal Nasopharyngeal Swab     Status: None   Collection Time: 06/19/20 10:11 PM   Specimen: Nasopharyngeal Swab  Result Value Ref Range Status   SARS Coronavirus 2 NEGATIVE NEGATIVE Final    Comment: (NOTE) SARS-CoV-2 target nucleic acids  are NOT DETECTED.  The SARS-CoV-2 RNA is generally detectable in upper and lower respiratory specimens during the acute phase of  infection. The lowest concentration of SARS-CoV-2 viral copies this assay can detect is 250 copies / mL. A negative result does not preclude SARS-CoV-2 infection and should not be used as the sole basis for treatment or other patient management decisions.  A negative result may occur with improper specimen collection / handling, submission of specimen other than nasopharyngeal swab, presence of viral mutation(s) within the areas targeted by this assay, and inadequate number of viral copies (<250 copies / mL). A negative result must be combined with clinical observations, patient history, and epidemiological information.  Fact Sheet for Patients:   StrictlyIdeas.no  Fact Sheet for Healthcare Providers: BankingDealers.co.za  This test is not yet approved or  cleared by the [place] FDA and has been authorized for detection and/or diagnosis of SARS-CoV-2 by FDA under an Emergency Use Authorization (EUA).  This EUA will remain in effect (meaning this test can be used) for the duration of the COVID-19 declaration under Section 564(b)(1) of the Act, 21 U.S.C. section 360bbb-3(b)(1), unless the authorization is terminated or revoked sooner.  Performed at [facility], [place] [address], [place], [place] [address]   Aerobic/Anaerobic Culture (surgical/deep wound)     Status: None   Collection Time: 06/20/20 12:45 PM   Specimen: Abscess  Result Value Ref Range Status   Specimen Description   Final    ABSCESS Performed at [place] [address], [place], [place] [address]    Special Requests   Final    NONE Performed at [facility], [place] [address], [place], [place] [address]    Gram Stain   Final    ABUNDANT WBC PRESENT, PREDOMINANTLY  PMN MODERATE GRAM POSITIVE COCCI IN PAIRS FEW GRAM NEGATIVE RODS RARE GRAM POSITIVE RODS    Culture   Final    ABUNDANT ESCHERICHIA COLI ABUNDANT BACTEROIDES SPECIES NOT FRAGILIS BETA LACTAMASE POSITIVE Performed at [facility], [place] [address], [place], [place] [address]    Report Status 06/23/2020 FINAL  Final   Organism ID, Bacteria ESCHERICHIA COLI  Final      Susceptibility   Escherichia coli - MIC*    AMPICILLIN >=32 RESISTANT Resistant     CEFAZOLIN <=4 SENSITIVE Sensitive     CEFEPIME <=0.12 SENSITIVE Sensitive     CEFTAZIDIME <=1 SENSITIVE Sensitive     CEFTRIAXONE <=0.25 SENSITIVE Sensitive     CIPROFLOXACIN <=0.25 SENSITIVE Sensitive     GENTAMICIN >=16 RESISTANT Resistant     IMIPENEM <=0.25 SENSITIVE Sensitive     TRIMETH/SULFA >=320 RESISTANT Resistant     AMPICILLIN/SULBACTAM >=32 RESISTANT Resistant     PIP/TAZO <=4 SENSITIVE Sensitive     * ABUNDANT ESCHERICHIA COLI  SARS Coronavirus 2 by RT PCR (hospital order, performed in [facility] hospital lab) Nasopharyngeal Nasopharyngeal Swab     Status: None   Collection Time: 06/26/20  7:45 AM   Specimen: Nasopharyngeal Swab  Result Value Ref Range Status   SARS Coronavirus 2 NEGATIVE NEGATIVE Final    Comment: (NOTE) SARS-CoV-2 target nucleic acids are NOT DETECTED.  The SARS-CoV-2 RNA is generally detectable in upper and lower respiratory specimens during the acute phase of infection. The lowest concentration of SARS-CoV-2 viral copies this assay can detect is 250 copies / mL. A negative result does not preclude SARS-CoV-2 infection and should not be used as the sole basis for treatment or other patient management decisions.  A negative result may occur with improper specimen collection / handling, submission of specimen  other than nasopharyngeal swab, presence of viral mutation(s) within the areas targeted by this assay, and inadequate number of viral copies (<250 copies / mL). A negative result must be  combined with clinical observations, patient history, and epidemiological information.  Fact Sheet for Patients:   StrictlyIdeas.no  Fact Sheet for Healthcare Providers: BankingDealers.co.za  This test is not yet approved or  cleared by the [place] FDA and has been authorized for detection and/or diagnosis of SARS-CoV-2 by FDA under an Emergency Use Authorization (EUA).  This EUA will remain in effect (meaning this test can be used) for the duration of the COVID-19 declaration under Section 564(b)(1) of the Act, 21 U.S.C. section 360bbb-3(b)(1), unless the authorization is terminated or revoked sooner.  Performed at [facility], [place] [address] [place], [place] [address]       Radiology Studies: DG Abd Portable 1V  Result Date: 06/28/2020 CLINICAL DATA:  Small-bowel obstruction EXAM: PORTABLE ABDOMEN - 1 VIEW COMPARISON:  the previous day's study FINDINGS: Nasogastric tube extends to the pylorus. Stomach is decompressed. Few gas filled small bowel loops in the mid abdomen. No definite dilated loops of bowel. Colon decompressed. Ostomy devices project left mid and right lower abdomen. Percutaneous pigtail catheter abscess drain stable in the left lower quadrant. IMPRESSION: Nonobstructed bowel gas pattern Electronically Signed   By: Lucrezia Europe M.D.   On: 06/28/2020 08:01     LOS: 3 days   Antonieta Pert, MD [facility]  06/29/2020, 12:51 PM

## 2020-06-30 LAB — BASIC METABOLIC PANEL
Anion gap: 13 (ref 5–15)
BUN: 14 mg/dL (ref 8–23)
CO2: 28 mmol/L (ref 22–32)
Calcium: 9.7 mg/dL (ref 8.9–10.3)
Chloride: 101 mmol/L (ref 98–111)
Creatinine, Ser: 0.89 mg/dL (ref 0.61–1.24)
GFR calc Af Amer: >60 mL/min (ref >60)
GFR calc non Af Amer: >60 mL/min (ref >60)
Glucose, Bld: 74 mg/dL (ref 70–99)
Potassium: 3.1 mmol/L — ABNORMAL LOW (ref 3.5–5.1)
Sodium: 142 mmol/L (ref 135–145)

## 2020-06-30 LAB — CBC
HCT: 23.4 % — ABNORMAL LOW (ref 39.0–52.0)
Hemoglobin: 7.4 g/dL — ABNORMAL LOW (ref 13.0–17.0)
MCH: 30 pg (ref 26.0–34.0)
MCHC: 31.6 g/dL (ref 30.0–36.0)
MCV: 94.7 fL (ref 80.0–100.0)
Platelets: 310 10*3/uL (ref 150–400)
RBC: 2.47 MIL/uL — ABNORMAL LOW (ref 4.22–5.81)
RDW: 14.1 % (ref 11.5–15.5)
WBC: 4.3 10*3/uL (ref 4.0–10.5)
nRBC: 0 % (ref 0.0–0.2)

## 2020-06-30 MED ORDER — FLUCONAZOLE IN SODIUM CHLORIDE 200-0.9 MG/100ML-% IV SOLN: 200.0000 mg | INTRAVENOUS | Status: DC

## 2020-06-30 MED ORDER — CIPROFLOXACIN IN D5W 400 MG/200ML IV SOLN: 400.0000 mg | Freq: Two times a day (BID) | INTRAVENOUS | Status: DC

## 2020-06-30 MED ORDER — POTASSIUM CHLORIDE 10 MEQ/100ML IV SOLN
10.0000 meq | INTRAVENOUS | Status: DC
  Administered 2020-06-30: 10 meq via INTRAVENOUS
  Filled 2020-06-30: qty 100

## 2020-06-30 NOTE — TOC Transition Note (Signed)
Transition of Care [facility]) - CM/SW Discharge Note   Patient Details  Name: Juan Richards MRN: [id number] Date of Birth: 03/21/1956  Transition of [facility] CM/SW Contact:  Trish Mage, LCSW Phone Number: 06/30/2020, 9:52 AM   Clinical Narrative:   Mr Carlile is being transferred to [facility] for further treatment.  No TOC needs identified.  TOC sign off.    Final next level of care: Other (comment) (transfer to hospital) Barriers to Discharge: No Barriers Identified   Patient Goals and CMS Choice        Discharge Placement                       Discharge Plan and Services                                     Social Determinants of Health (SDOH) Interventions     Readmission Risk Interventions No flowsheet data found.

## 2020-06-30 NOTE — Progress Notes (Signed)
[facility] Progress Note     Subjective: Patient refused transfer overnight but fortunately has a bed available again this AM and is agreeable to transfer. He denies pain this AM. He is anxious to get up and wash prior to leaving.   Objective: Vital signs in last 24 hours: Temp:  [98.1 F (36.7 C)-99.1 F (37.3 C)] 98.1 F (36.7 C) (07/13 0901) Pulse Rate:  [51-55] 51 (07/13 0901) Resp:  [16-18] 18 (07/13 0901) BP: (134-148)/(79-83) 134/83 (07/13 0901) SpO2:  [99 %-100 %] 100 % (07/13 0901) Last BM Date: 06/29/20  Intake/Output from previous day: 07/12 0701 - 07/13 0700 In: 1957.5 [I.V.:955.3; IV Piggyback:997.2] Out: 1300 [Emesis/NG output:1300] Intake/Output this shift: Total I/O In: -  Out: 500 [Emesis/NG output:500]  PE: General: pleasant, WD, WN male who is laying in bed in NAD Heart: regular, rate, and rhythm.  Palpable radial and pedal pulses bilaterally Lungs: CTAB, no wheezes, rhonchi, or rales noted.  Respiratory effort nonlabored Abd: soft, NT, ostomy with small amount of brown liquid in bag, NGT in place MS: all 4 extremities are symmetrical with no cyanosis, clubbing, or edema.    Lab Results:  Recent Labs    06/29/20 0308 06/30/20 0407  WBC 3.2* 4.3  HGB 7.3* 7.4*  HCT 23.3* 23.4*  PLT 350 310   BMET Recent Labs    06/29/20 0308 06/30/20 0407  NA 143 142  K 3.3* 3.1*  CL 105 101  CO2 28 28  GLUCOSE 77 74  BUN 15 14  CREATININE 0.99 0.89  CALCIUM 9.9 9.7   PT/INR No results for input(s): LABPROT, INR in the last 72 hours. CMP     Component Value Date/Time   NA 142 06/30/2020 0407   K 3.1 (L) 06/30/2020 0407   CL 101 06/30/2020 0407   CO2 28 06/30/2020 0407   GLUCOSE 74 06/30/2020 0407   BUN 14 06/30/2020 0407   CREATININE 0.89 06/30/2020 0407   CREATININE 1.30 (H) 06/09/2020 1008   CALCIUM 9.7 06/30/2020 0407   PROT 6.7 06/27/2020 0827   ALBUMIN 2.7 (L) 06/27/2020 0827   AST 17 06/27/2020 0827   AST 20 06/09/2020  1008   ALT 26 06/27/2020 0827   ALT 26 06/09/2020 1008   ALKPHOS 102 06/27/2020 0827   BILITOT 0.5 06/27/2020 0827   BILITOT 0.6 06/09/2020 1008   GFRNONAA >60 06/30/2020 0407   GFRNONAA 58 (L) 06/09/2020 1008   GFRAA >60 06/30/2020 0407   GFRAA >60 06/09/2020 1008   Lipase     Component Value Date/Time   LIPASE 28 06/26/2020 0405       Studies/Results: No results found.  Anti-infectives: Anti-infectives (From admission, onward)   Start     Dose/Rate Route Frequency Ordered Stop   06/30/20 0000  ciprofloxacin (CIPRO) 400 MG/200ML SOLN     Discontinue     400 mg Intravenous Every 12 hours 06/30/20 0852     06/30/20 0000  fluconazole (DIFLUCAN) 200-0.9 MG/100ML-% IVPB     Discontinue     200 mg Intravenous Every 24 hours 06/30/20 0852     06/26/20 0900  fluconazole (DIFLUCAN) IVPB 200 mg     Discontinue     200 mg 100 mL/hr over 60 Minutes Intravenous Every 24 hours 06/26/20 0829 07/03/20 0859   06/26/20 0830  ciprofloxacin (CIPRO) IVPB 400 mg     Discontinue     400 mg 200 mL/hr over 60 Minutes Intravenous Every 12 hours 06/26/20 0829  Assessment/Plan Gob[place] 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 Should alert IR of patient's admission so they can follow his drain  Partial small bowel obstruction The patient appears to likely have a partial small bowel obstruction as he is having N/V but he is still also having semi-solid feculent output in his ileostomy pouch. He is working with Dr. [name] for possible cytoreductive surgery. He is transferring to [facility] this AM.    LOS: 4 days    Norm Parcel , [facility] 06/30/2020, 9:25 AM Please see Amion for pager number during day hours 7:00am-4:30pm

## 2020-06-30 NOTE — Progress Notes (Signed)
[facility] called this am with bed assignment 926 in [place].  Transport arranged by this nurse with [facility] transport team.  Patient left hospital via transport team.  All belongings and paperwork sent with him.  Family aware of transport.    Virginia Rochester, RN

## 2020-06-30 NOTE — Progress Notes (Signed)
Report given to Marylyn Ishihara, RN at [facility].

## 2020-07-02 ENCOUNTER — Inpatient Hospital Stay: Payer: 59

## 2020-07-02 ENCOUNTER — Inpatient Hospital Stay: Payer: 59 | Admitting: Oncology

## 2020-07-10 ENCOUNTER — Telehealth: Payer: Self-pay | Admitting: Internal Medicine

## 2020-07-10 NOTE — Telephone Encounter (Signed)
F/u    [place] in the office until 6 pm asking will Dr. Jenny Reichmann be the attending physician

## 2020-07-10 NOTE — Telephone Encounter (Signed)
Sent to Dr. John. 

## 2020-07-10 NOTE — Telephone Encounter (Signed)
[facility] for hospice attending, but also ok for hospice MD to provide symptomatic medications as needed

## 2020-07-10 NOTE — Telephone Encounter (Signed)
New Message:   Donella Stade is calling from [facility] and states the pt is going into [place] and she would like to know if Dr. Jenny Reichmann will be the attending physician to give orders for the pt. She states the pt is to be discharged today so they would like to know as soon as possible so they can get the orders started for the pt. Please advise.

## 2020-07-13 NOTE — Telephone Encounter (Signed)
Spoke with Alwyn Ren at [facility] and informed her of Dr. Gwynn Burly note. She has stated she will inform Crystal of Dr. Gwynn Burly note.  I also left the office phone number for them to call back if needed with any further concerns or questions.

## 2020-07-23 ENCOUNTER — Telehealth: Payer: Self-pay | Admitting: *Deleted

## 2020-07-23 NOTE — Telephone Encounter (Signed)
Called patient to follow up on status since leaving hospital. He declined Hospice and has home health agency providing TPN (24 hours/day) + IV fluids. Has G-tube for decompression and no further output from his ileostomy. Having issues with pain control (on Duragesic patch 50 mcg) and having some difficulty reaching nurse for Dr. [name]. Provided him with telephone # of the RN Navigator at [facility] to reach out. Made him aware that we are here for him if he needs [place]. He appreciated the call to check on him.

## 2020-07-23 NOTE — Telephone Encounter (Signed)
Thanks, we should offer him an appt. Here to discuss hospice and help with pain control

## 2020-08-04 ENCOUNTER — Telehealth: Payer: Self-pay

## 2020-08-04 NOTE — Telephone Encounter (Signed)
Navigator from [facility] called to check to see if Juan Richards had any upcoming appointment. States patient is currently admitted to [facility], possibility that patient will be discharged tomorrow to hospice.

## 2020-08-11 ENCOUNTER — Telehealth: Payer: Self-pay | Admitting: Internal Medicine

## 2020-08-11 ENCOUNTER — Telehealth: Payer: Self-pay | Admitting: Nurse Practitioner

## 2020-08-11 ENCOUNTER — Encounter: Payer: Self-pay | Admitting: *Deleted

## 2020-08-11 NOTE — Telephone Encounter (Signed)
New Message:   Ephriam Knuckles is calling on behalf of the pt and states the pt will be getting out of rehab this week and they need to know if Dr. Jenny Reichmann will be following the pt for his after care needs. Please advise.

## 2020-08-11 NOTE — Telephone Encounter (Signed)
Scheduled per 8/24 sch message. Pt is aware of appt time and date.

## 2020-08-11 NOTE — Progress Notes (Signed)
Per Dr. Benay Spice: Needs OV w/him or NP on 9/9 or 9/10. Scheduling message sent w/request.

## 2020-08-12 NOTE — Telephone Encounter (Signed)
Not sure who Juan Richards is or what she is specifically asking for, but if she is from home health I should that would be ok

## 2020-08-12 NOTE — Telephone Encounter (Signed)
Sent to Dr. John to advise. 

## 2020-08-14 NOTE — Telephone Encounter (Signed)
Was able to [facility] for Juan Richards at the rehab center Of Dr. Gwynn Burly response on following the pts after care once released from rehab.

## 2020-08-26 ENCOUNTER — Telehealth: Payer: Self-pay | Admitting: *Deleted

## 2020-08-26 NOTE — Telephone Encounter (Signed)
Called to report they will bring him tomorrow if they can get him out of the house. His brother is coming over to help get him in the car if he can. If they are not able to get him here, can they do a virtual appointment. Informed that if they are not able to come tomorrow, we can schedule him for a virtual appointment w/Dr. Benay Spice.

## 2020-08-27 ENCOUNTER — Other Ambulatory Visit: Payer: Self-pay

## 2020-08-27 ENCOUNTER — Encounter: Payer: Self-pay | Admitting: Nurse Practitioner

## 2020-08-27 ENCOUNTER — Inpatient Hospital Stay: Payer: 59 | Attending: Nurse Practitioner | Admitting: Nurse Practitioner

## 2020-08-27 VITALS — BP 129/88 | HR 99 | Temp 96.8°F | Resp 16 | Ht 70.0 in

## 2020-08-27 DIAGNOSIS — Z9221 Personal history of antineoplastic chemotherapy: Secondary | ICD-10-CM | POA: Diagnosis not present

## 2020-08-27 DIAGNOSIS — D638 Anemia in other chronic diseases classified elsewhere: Secondary | ICD-10-CM | POA: Diagnosis not present

## 2020-08-27 DIAGNOSIS — K9429 Other complications of gastrostomy: Secondary | ICD-10-CM | POA: Insufficient documentation

## 2020-08-27 DIAGNOSIS — Z923 Personal history of irradiation: Secondary | ICD-10-CM | POA: Diagnosis not present

## 2020-08-27 DIAGNOSIS — R110 Nausea: Secondary | ICD-10-CM | POA: Diagnosis not present

## 2020-08-27 DIAGNOSIS — C181 Malignant neoplasm of appendix: Secondary | ICD-10-CM | POA: Insufficient documentation

## 2020-08-27 DIAGNOSIS — Z8546 Personal history of malignant neoplasm of prostate: Secondary | ICD-10-CM | POA: Diagnosis not present

## 2020-08-27 DIAGNOSIS — Z8616 Personal history of COVID-19: Secondary | ICD-10-CM | POA: Diagnosis not present

## 2020-08-27 NOTE — Progress Notes (Addendum)
Juan Richards OFFICE PROGRESS NOTE   Diagnosis: Appendiceal cancer  INTERVAL HISTORY:   Juan Richards returns for follow-up.  He has been enrolled in the hospice program.  He has a palliative gastrostomy tube.  Some nausea.  He has recently had some output in the colostomy bag.  He reports receiving daily IV fluids.  He drinks liquids for pleasure.  His pain level varies between 1 and 6.  He is on a Dilaudid drip with bolus as needed.  He is concerned the gastrostomy tube is not sutured in place.  He notes drainage around the tube site.  Objective:  Vital signs in last 24 hours:  Blood pressure 129/88, pulse 99, temperature (!) 96.8 F (36 C), temperature source Tympanic, resp. rate 16, height $RemoveBe'5\' 10"'AIyORWrVA$  (1.778 m), SpO2 95 %.     Resp: Lungs clear bilaterally. Cardio: Regular rate and rhythm. GI: Gastrostomy tube left abdomen.  Drainage at the tube site.  No erythema. Vascular: No leg edema. Port-A-Cath without erythema.  Lab Results:  Lab Results  Component Value Date   WBC 4.3 06/30/2020   HGB 7.4 (L) 06/30/2020   HCT 23.4 (L) 06/30/2020   MCV 94.7 06/30/2020   PLT 310 06/30/2020   NEUTROABS 4.6 06/26/2020    Imaging:  No results found.  Medications: I have reviewed the patient's current medications.  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, 12 tumor deposits, 0/10 lymph nodes positive, negative resection margins, metastatic carcinoma involving the ileal mesentery noted, low tumor purity with decreased sensitivity for detection of HER-2 copy number and other alterations, microsatellite status and tumor mutation burden could not be  determined,MSS, no loss of mismatch repair protein expression -Diffuse subcentimeter nodularity over the mesentery and bowel, sigmoid colon adherent to bladder and left middle pelvic sidewall, gross contaminationwith succus -CTs 03/12/2020-no chest metastases, long segment of sigmoid colon thickening, no discrete peritoneal nodule, nodular anterior bladder surface -Cycle 1 FOLFOX 03/31/2020 -Cycle 2 FOLFOX 04/15/2020, Emend added -Cycle 3 FOLFOX 04/29/2020, home Decadron prophylaxis added -Cycle 4 FOLFOX 05/12/2020 -Cycle 5 FOLFOX 05/27/2020, oxaliplatin held due to neuropathy symptoms             -[facility] at [facility]/19/2020-mild right hydronephrosis due to compression of the ureter in the region of the sigmoid colon, peripheral enhancing fluid collection at the left iliac is muscle adjacent to the sigmoid colon.  Omental haziness and nodularity lower abdomen and around the descending colon in the left paracolic gutter, soft tissue stranding around the sigmoid colon extends to the superior bladder dome, asymmetric thickening of the bladder wall  -Admission at [facility] 06/30/2020 through 07/13/2020 with bowel obstruction.  Status post exploratory laparotomy and palliative gastrostomy tube placement  2.Prostate cancer diagnosed in August 2019, robotic assisted prostatectomy 09/21/2018-Gleason 7 acinar tumor,pT3pN1,invasion of seminal vesicles, 3/16 lymph nodes positive, positive right anterior margin positive -Gleason score 4+3 equal 7 and PSA of 60.97. -Staging work-up did not show any evidence of metastatic disease. -Bone scan 08/17/2018-negative for metastatic disease -Elevated PSA July 2019 -PET scanscan 10/15/2019-negative for metastatic disease, evidence of sigmoid diverticulitis with a small contained  perforation -Radiation-definitive, 10/29/2019-01/15/2020, interrupted 11/22/2019-12/17/2019 secondary to COVID-19 infection  3. Anemia secondary to chronic disease and chemotherapy 4. Deconditioning 5.COVID-19 infection December 2020 6.Orthostatic hypotension 03/19/2020 secondary to  high output ileostomy 7.Hypomagnesemia secondary to high-output ileostomy, repleted IV 03/19/2020,03/24/2020,03/31/2020 8.  Oral candidiasis 06/19/2020 9.  Left leg weakness and numbness-nerve compression related to the left pelvic inflammatory process?  Improved following abscess drainage 06/20/2020 10.  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  He is followed by hospice.  Pain is well controlled on the current regimen.  He has a palliative gastrostomy tube with some leakage at the site.  We discussed this with interventional radiology and will make a referral for evaluation/tube exchange.  He will continue oral fluids as tolerated.  We will schedule a follow-up appointment in 3 to 4 weeks.  We are available to see him sooner if needed.  Patient seen with Dr. Benay Spice.    Ned Card ANP/GNP-BC   08/27/2020  2:44 PM  This was a shared visit with Ned Card.  Juan Richards was interviewed and examined.  He has returned home from [facility].  He continues follow-up with the home hospice team.  We will refer him to interventional radiology to evaluate the gastrostomy tube.  He will clamp the tube as tolerated.  Juan Richards would like to continue follow-up at the Cancer center.  Julieanne Manson, MD

## 2020-08-28 ENCOUNTER — Telehealth: Payer: Self-pay | Admitting: Oncology

## 2020-08-28 NOTE — Telephone Encounter (Signed)
Scheduled appointment per 9/9 los. Spoke with patient's wife who is aware of appointment date and times.

## 2020-09-01 ENCOUNTER — Other Ambulatory Visit (HOSPITAL_COMMUNITY): Payer: 59

## 2020-09-18 DEATH — deceased

## 2020-09-21 ENCOUNTER — Inpatient Hospital Stay: Payer: 59 | Admitting: Oncology

## 2020-09-28 ENCOUNTER — Telehealth: Payer: Self-pay | Admitting: Internal Medicine

## 2020-09-28 NOTE — Telephone Encounter (Signed)
[place] dropped off death certificate. Placed in MD's box to be signed.

## 2020-09-29 NOTE — Telephone Encounter (Signed)
Death certificate signed by MD. [facility] called for pick up.

## 2021-04-16 IMAGING — CT CT IMAGE GUIDED DRAINAGE BY PERCUTANEOUS CATHETER
1 of 3 series · 13 of 32 positions shown, 18 images · non-contrast
Comparison: none

INDICATION: 64-year-old male with a history of left lower quadrant fluid
collection
TECHNIQUE: Informed written consent was obtained from the patient after a
thorough discussion of the procedural risks, benefits and
alternatives. All questions were addressed. Maximal Sterile Barrier
Technique was utilized including caps, mask, sterile gowns, sterile
gloves, sterile drape, hand hygiene and skin antiseptic. A timeout
was performed prior to the initiation of the procedure.

[Series 2: i-spiral 5.0 b31f · axial · 0.91mm/px · z∈[+1087,+1234]mm · 13 of 48 slices shown, 18 images]
[im 3/48  soft-tissue]
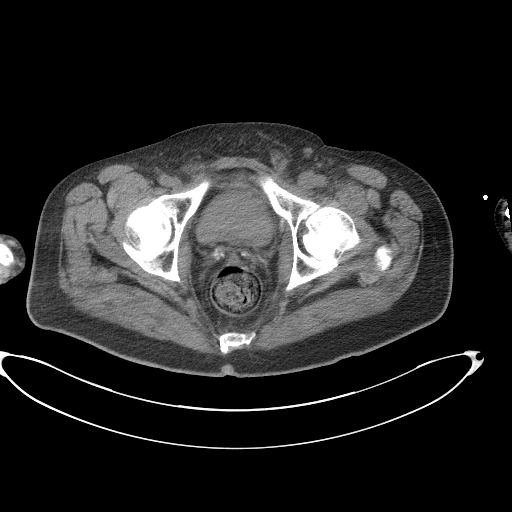
[im 3/48  bone]
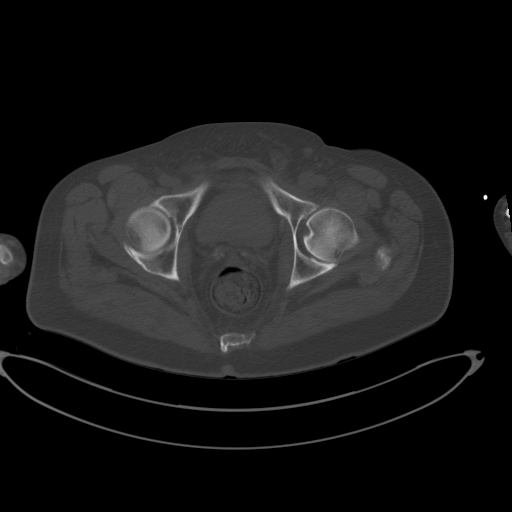
[im 6/48  soft-tissue]
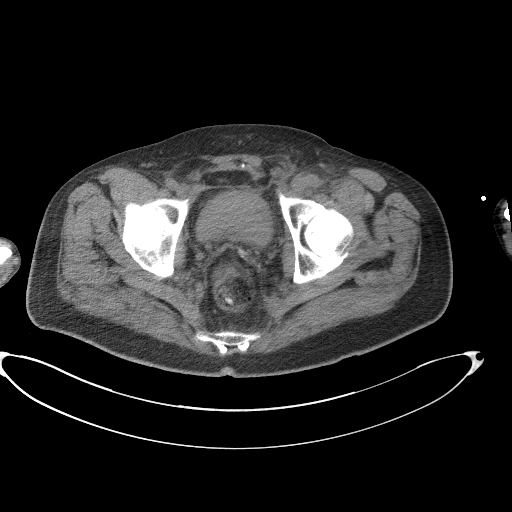
[im 12/48  soft-tissue]
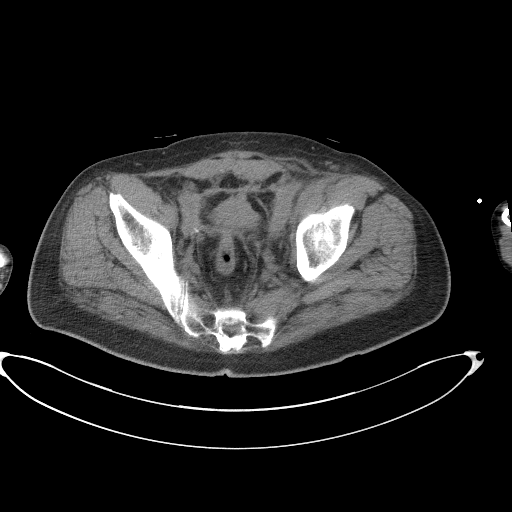
[im 15/48  soft-tissue]
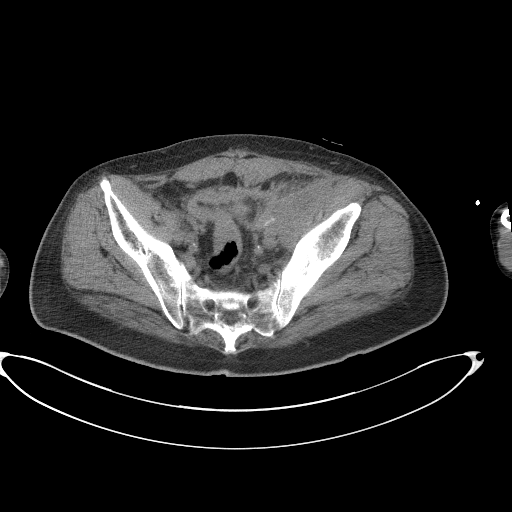
[im 18/48  soft-tissue]
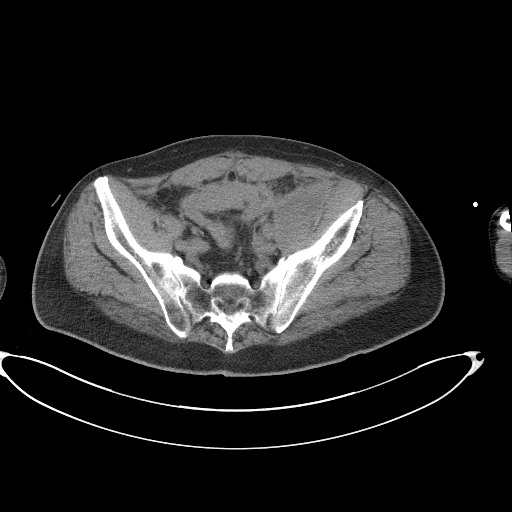
[im 21/48  soft-tissue]
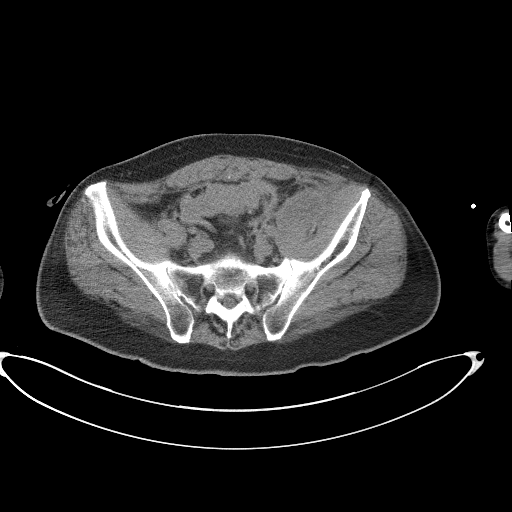
[im 27/48  soft-tissue]
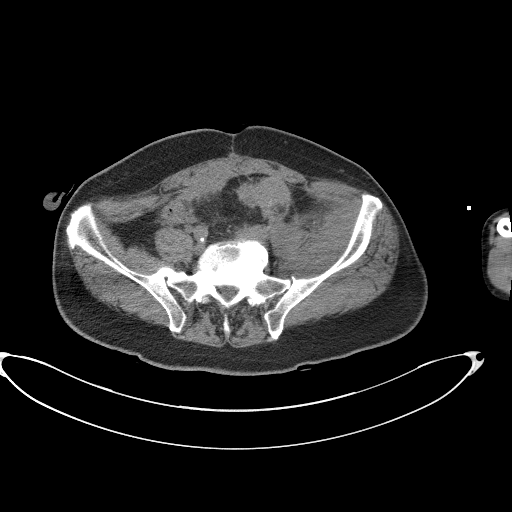
[im 30/48  soft-tissue]
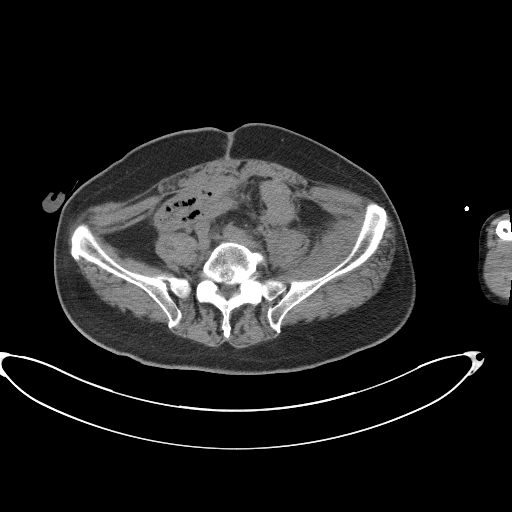
[im 33/48  soft-tissue]
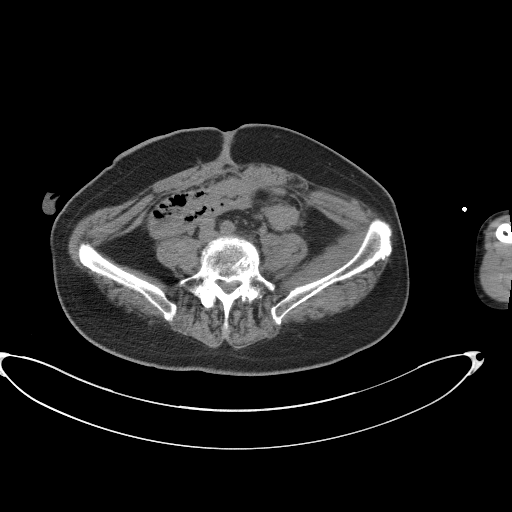
[im 33/48  bone]
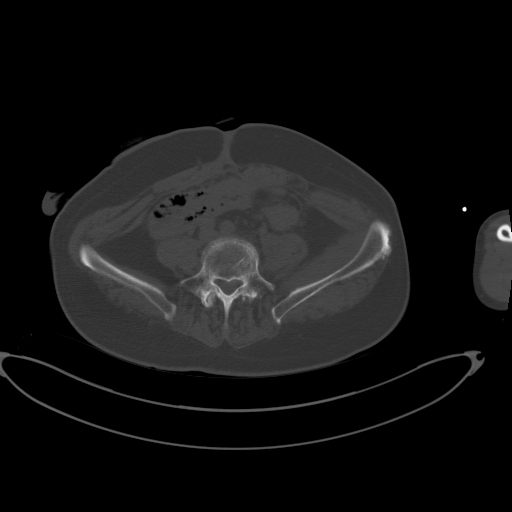
[im 36/48  soft-tissue]
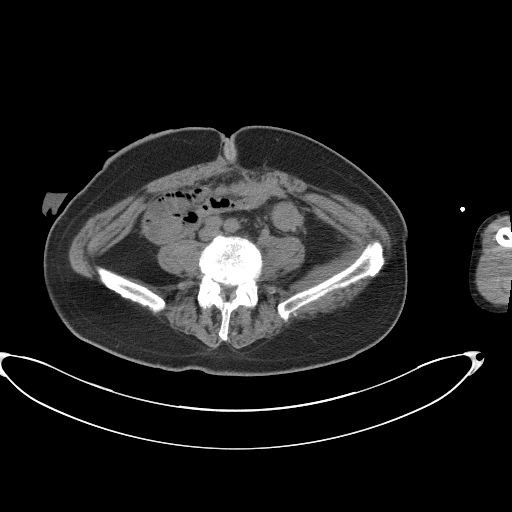
[im 36/48  lung]
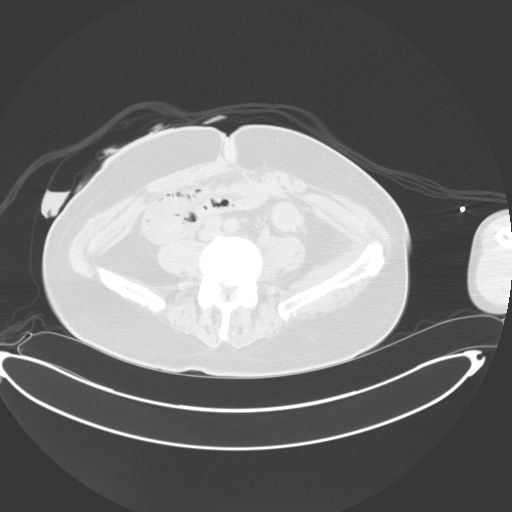
[im 39/48  lung]
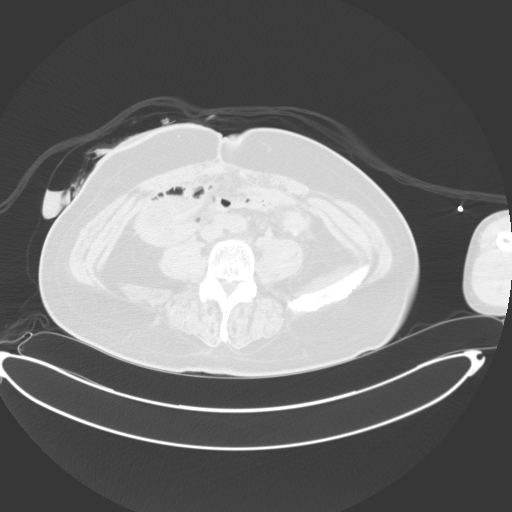
[im 42/48  soft-tissue]
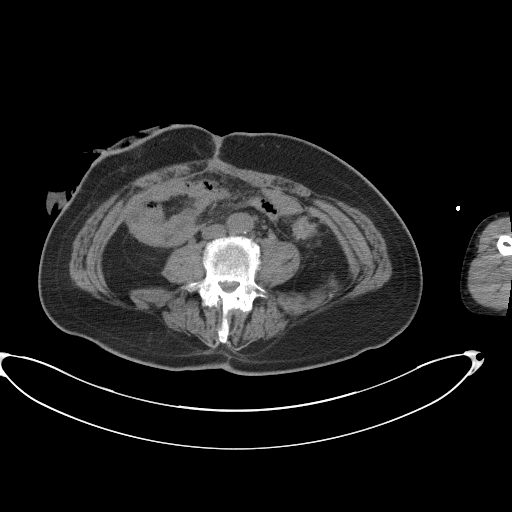
[im 42/48  lung]
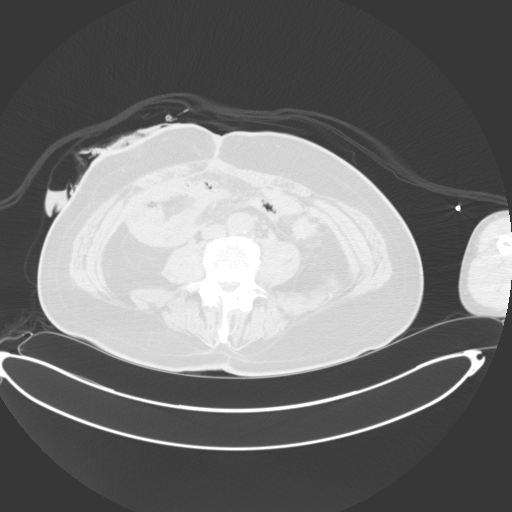
[im 45/48  soft-tissue]
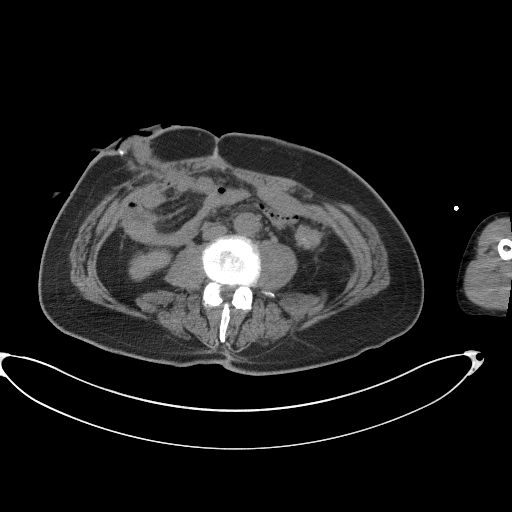
[im 45/48  lung]
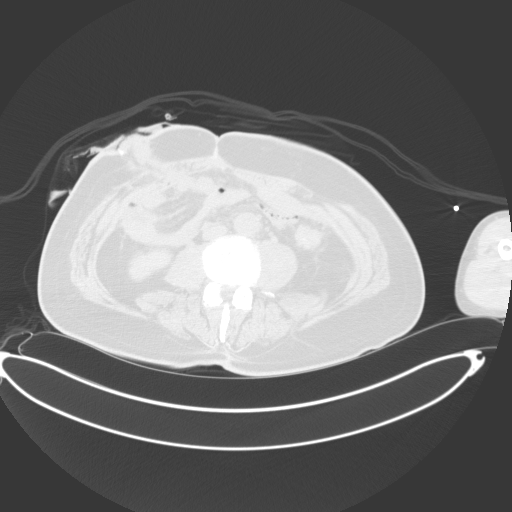

[13 of 32 positions shown; findings below may reference images not displayed]

EXAM:
CT GUIDED DRAINAGE OF PELVIC ABSCESS

MEDICATIONS:
The patient is currently admitted to the hospital and receiving
intravenous antibiotics. The antibiotics were administered within an
appropriate time frame prior to the initiation of the procedure.

ANESTHESIA/SEDATION:
1.0 mg IV Versed 50 mcg IV Fentanyl

Moderate Sedation Time:  20 minutes

The patient was continuously monitored during the procedure by the
interventional radiology nurse under my direct supervision.

COMPLICATIONS:
None
PROCEDURE:
The operative field was prepped with Chlorhexidine in a sterile
fashion, and a sterile drape was applied covering the operative
field. A sterile gown and sterile gloves were used for the
procedure. Local anesthesia was provided with 1% Lidocaine.

Patient positioned supine position. CT scan was acquired for
planning purposes. Once the patient was prepped and draped in the
usual sterile fashion, 1% lidocaine was used for local anesthesia.

Using CT guidance, trocar needle was advanced into the fluid
collection of the left lower quadrant.

Modified Seldinger technique was used to place a 10 French drain
into the collection. Approximately 30 cc of frank pus was aspirated.
Saline wash was performed for a cytology sample.

Culture was sent and cytology sample sent.

Catheter was sutured in position and attached to bulb suction.

Final CT was acquired.

Patient tolerated the procedure well and remained hemodynamically
stable throughout.

No complications were encountered and no significant blood loss.
FINDINGS: CT demonstrates ill-defined fluid collection corresponding to the
prior contrast-enhanced CT, involving the left ileo psoas
musculature.

Approximately 30 cc of purulent material aspirated.
IMPRESSION: Status post CT-guided drainage of abscess of the left lower quadrant
with both cytology sample and culture sent.

## 2021-04-23 IMAGING — DX DG ABDOMEN 2V
2 series · 2 of 2 positions shown · non-contrast
Comparison: Prior studies

CLINICAL DATA: NG tube placement.  Small-bowel obstruction.

EXAM:
ABDOMEN - 2 VIEW

[abdomen erect]
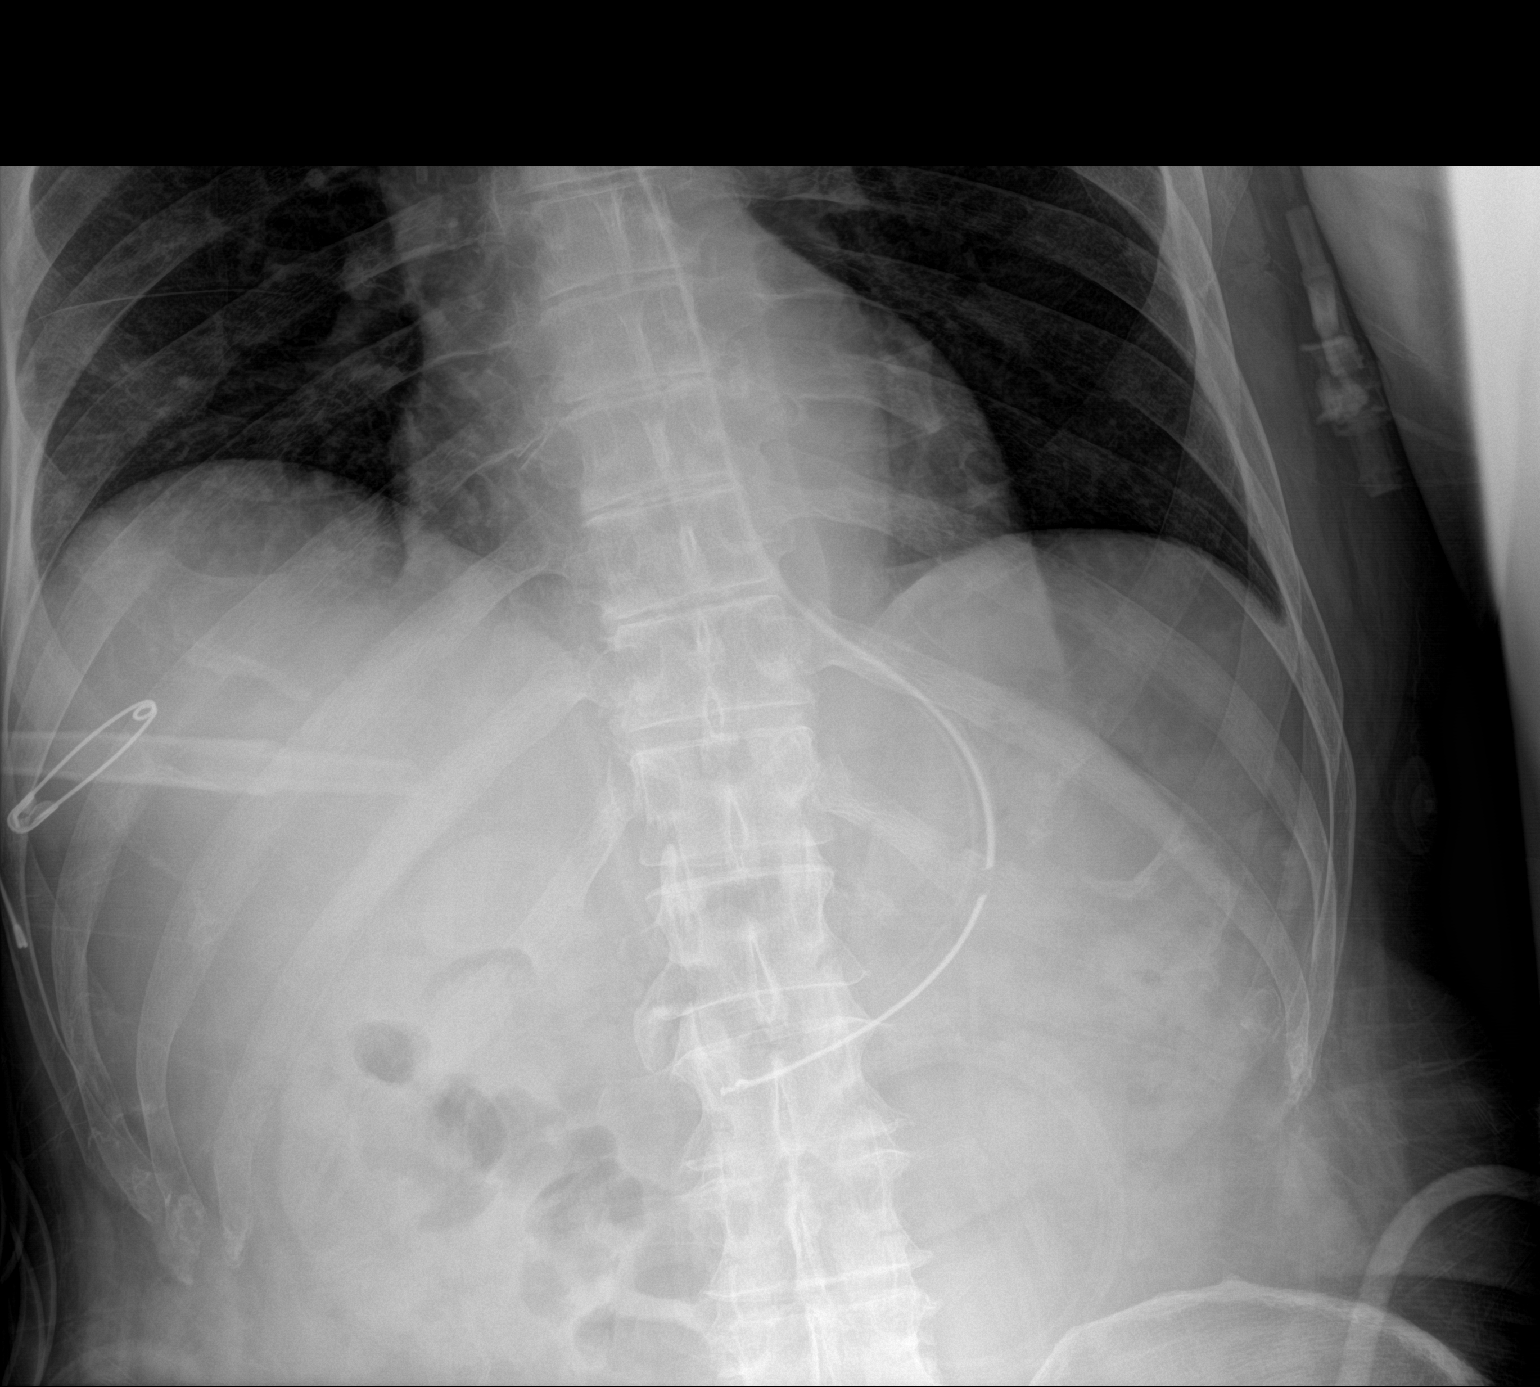

[abdomen supine]
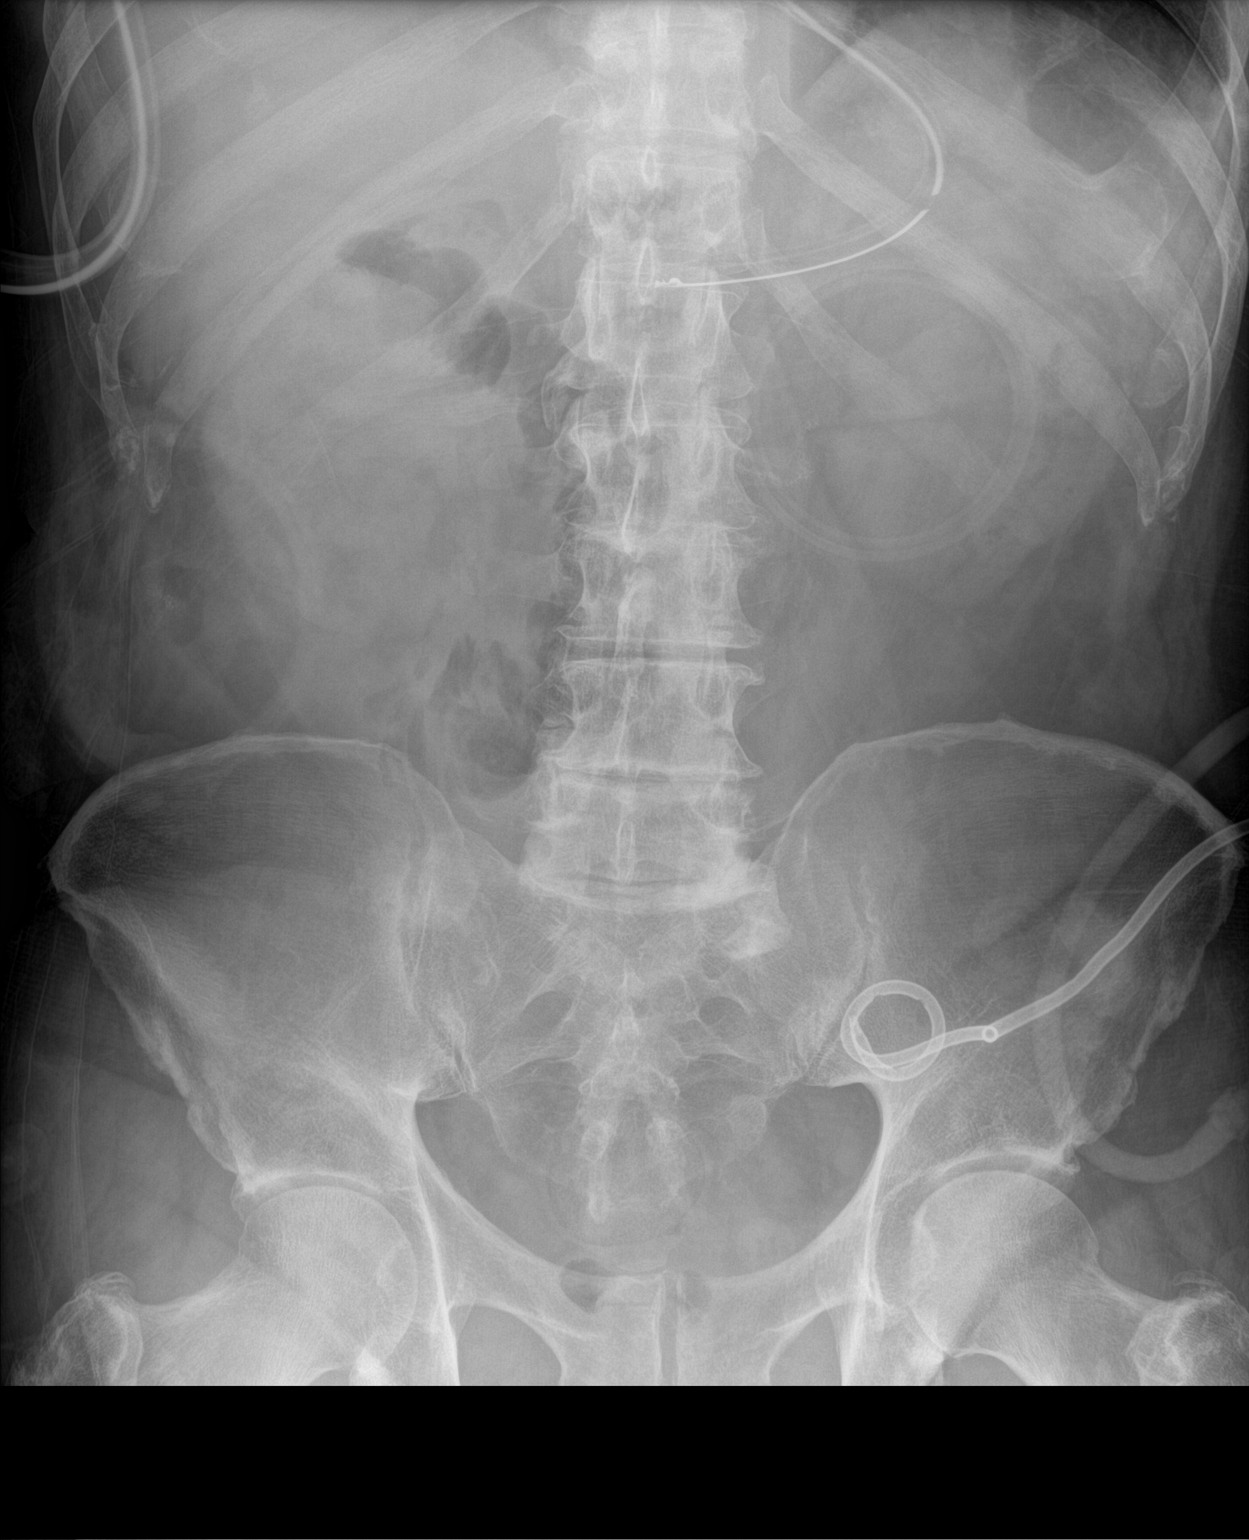

[2 of 2 positions shown; findings below may reference images not displayed]

FINDINGS: An NG tube is identified with tip overlying the mid-distal stomach.

No other significant change noted.

No dilated bowel loops are identified.

A percutaneous catheter overlying the LOWER LEFT abdomen is again
noted.
IMPRESSION: NG tube with tip overlying the mid-distal stomach.
